# Patient Record
Sex: Male | Born: 1955
Health system: Southern US, Community
[De-identification: ages and names within clinical notes are randomized; demographics above are authoritative.]

## PROBLEM LIST (undated history)

## (undated) DIAGNOSIS — R7881 Bacteremia: Secondary | ICD-10-CM

## (undated) DIAGNOSIS — I509 Heart failure, unspecified: Secondary | ICD-10-CM

## (undated) DIAGNOSIS — I1310 Hypertensive heart and chronic kidney disease without heart failure, with stage 1 through stage 4 chronic kidney disease, or unspecified chronic kidney disease: Secondary | ICD-10-CM

## (undated) DIAGNOSIS — I469 Cardiac arrest, cause unspecified: Secondary | ICD-10-CM

## (undated) DIAGNOSIS — T827XXA Infection and inflammatory reaction due to other cardiac and vascular devices, implants and grafts, initial encounter: Secondary | ICD-10-CM

## (undated) DIAGNOSIS — R011 Cardiac murmur, unspecified: Secondary | ICD-10-CM

## (undated) DIAGNOSIS — I10 Essential (primary) hypertension: Secondary | ICD-10-CM

## (undated) DIAGNOSIS — J9601 Acute respiratory failure with hypoxia: Secondary | ICD-10-CM

## (undated) DIAGNOSIS — E119 Type 2 diabetes mellitus without complications: Secondary | ICD-10-CM

## (undated) DIAGNOSIS — N186 End stage renal disease: Secondary | ICD-10-CM

## (undated) DIAGNOSIS — I2510 Atherosclerotic heart disease of native coronary artery without angina pectoris: Secondary | ICD-10-CM

## (undated) DIAGNOSIS — I429 Cardiomyopathy, unspecified: Secondary | ICD-10-CM

## (undated) HISTORY — DX: Acute respiratory failure with hypoxia: J96.01

## (undated) HISTORY — PX: EXTERNAL EAR SURGERY: SHX627

## (undated) HISTORY — DX: Atherosclerotic heart disease of native coronary artery without angina pectoris: I25.10

## (undated) HISTORY — DX: Bacteremia: R78.81

## (undated) HISTORY — DX: Infection and inflammatory reaction due to other cardiac and vascular devices, implants and grafts, initial encounter: T82.7XXA

## (undated) HISTORY — PX: FINGER SURGERY: SHX640

## (undated) HISTORY — DX: Hypertensive heart and chronic kidney disease without heart failure, with stage 1 through stage 4 chronic kidney disease, or unspecified chronic kidney disease: I13.10

## (undated) HISTORY — DX: End stage renal disease: N18.6

## (undated) HISTORY — DX: Cardiomyopathy, unspecified: I42.9

## (undated) HISTORY — PX: TONSILLECTOMY: SUR1361

## (undated) SURGERY — Surgical Case
Anesthesia: *Unknown

---

## 2008-06-20 ENCOUNTER — Ambulatory Visit: Payer: Self-pay

## 2014-01-13 ENCOUNTER — Inpatient Hospital Stay (HOSPITAL_COMMUNITY)
Admission: EM | Admit: 2014-01-13 | Discharge: 2014-01-17 | DRG: 682 | Disposition: A | Discharge status: Home / Self Care | Payer: Self-pay | Attending: Internal Medicine | Admitting: Internal Medicine

## 2014-01-13 ENCOUNTER — Encounter (HOSPITAL_COMMUNITY): Payer: Self-pay | Admitting: Emergency Medicine

## 2014-01-13 DIAGNOSIS — I120 Hypertensive chronic kidney disease with stage 5 chronic kidney disease or end stage renal disease: Secondary | ICD-10-CM | POA: Diagnosis present

## 2014-01-13 DIAGNOSIS — E213 Hyperparathyroidism, unspecified: Secondary | ICD-10-CM | POA: Diagnosis present

## 2014-01-13 DIAGNOSIS — R7989 Other specified abnormal findings of blood chemistry: Secondary | ICD-10-CM | POA: Diagnosis present

## 2014-01-13 DIAGNOSIS — E1121 Type 2 diabetes mellitus with diabetic nephropathy: Secondary | ICD-10-CM

## 2014-01-13 DIAGNOSIS — R0609 Other forms of dyspnea: Secondary | ICD-10-CM

## 2014-01-13 DIAGNOSIS — I10 Essential (primary) hypertension: Secondary | ICD-10-CM | POA: Insufficient documentation

## 2014-01-13 DIAGNOSIS — R740 Nonspecific elevation of levels of transaminase and lactic acid dehydrogenase [LDH]: Secondary | ICD-10-CM | POA: Diagnosis present

## 2014-01-13 DIAGNOSIS — I5041 Acute combined systolic (congestive) and diastolic (congestive) heart failure: Secondary | ICD-10-CM | POA: Diagnosis present

## 2014-01-13 DIAGNOSIS — R0600 Dyspnea, unspecified: Secondary | ICD-10-CM

## 2014-01-13 DIAGNOSIS — N189 Chronic kidney disease, unspecified: Secondary | ICD-10-CM | POA: Diagnosis present

## 2014-01-13 DIAGNOSIS — N185 Chronic kidney disease, stage 5: Secondary | ICD-10-CM | POA: Diagnosis present

## 2014-01-13 DIAGNOSIS — E8809 Other disorders of plasma-protein metabolism, not elsewhere classified: Secondary | ICD-10-CM | POA: Diagnosis present

## 2014-01-13 DIAGNOSIS — Z87891 Personal history of nicotine dependence: Secondary | ICD-10-CM

## 2014-01-13 DIAGNOSIS — J90 Pleural effusion, not elsewhere classified: Secondary | ICD-10-CM

## 2014-01-13 DIAGNOSIS — I5043 Acute on chronic combined systolic (congestive) and diastolic (congestive) heart failure: Secondary | ICD-10-CM

## 2014-01-13 DIAGNOSIS — D631 Anemia in chronic kidney disease: Secondary | ICD-10-CM | POA: Diagnosis present

## 2014-01-13 DIAGNOSIS — R7401 Elevation of levels of liver transaminase levels: Secondary | ICD-10-CM

## 2014-01-13 DIAGNOSIS — N179 Acute kidney failure, unspecified: Principal | ICD-10-CM | POA: Diagnosis present

## 2014-01-13 DIAGNOSIS — R600 Localized edema: Secondary | ICD-10-CM

## 2014-01-13 HISTORY — DX: Type 2 diabetes mellitus without complications: E11.9

## 2014-01-13 HISTORY — DX: Essential (primary) hypertension: I10

## 2014-01-13 LAB — COMPREHENSIVE METABOLIC PANEL
ALBUMIN: 2.9 g/dL — AB (ref 3.5–5.2)
ALT: 72 U/L — AB (ref 0–53)
AST: 48 U/L — AB (ref 0–37)
Alkaline Phosphatase: 236 U/L — ABNORMAL HIGH (ref 39–117)
Anion gap: 17 — ABNORMAL HIGH (ref 5–15)
BUN: 54 mg/dL — ABNORMAL HIGH (ref 6–23)
CALCIUM: 8.3 mg/dL — AB (ref 8.4–10.5)
CO2: 19 mEq/L (ref 19–32)
Chloride: 103 mEq/L (ref 96–112)
Creatinine, Ser: 5.94 mg/dL — ABNORMAL HIGH (ref 0.50–1.35)
GFR calc Af Amer: 11 mL/min — ABNORMAL LOW (ref >90)
GFR calc non Af Amer: 9 mL/min — ABNORMAL LOW (ref >90)
Glucose, Bld: 110 mg/dL — ABNORMAL HIGH (ref 70–99)
Potassium: 4.4 mEq/L (ref 3.7–5.3)
SODIUM: 139 meq/L (ref 137–147)
TOTAL PROTEIN: 6.5 g/dL (ref 6.0–8.3)
Total Bilirubin: 0.4 mg/dL (ref 0.3–1.2)

## 2014-01-13 LAB — CBC WITH DIFFERENTIAL/PLATELET
Basophils Absolute: 0 10*3/uL (ref 0.0–0.1)
Basophils Relative: 0 % (ref 0–1)
EOS PCT: 1 % (ref 0–5)
Eosinophils Absolute: 0.1 10*3/uL (ref 0.0–0.7)
HEMATOCRIT: 33.8 % — AB (ref 39.0–52.0)
HEMOGLOBIN: 10.9 g/dL — AB (ref 13.0–17.0)
LYMPHS ABS: 1.4 10*3/uL (ref 0.7–4.0)
Lymphocytes Relative: 16 % (ref 12–46)
MCH: 26.9 pg (ref 26.0–34.0)
MCHC: 32.2 g/dL (ref 30.0–36.0)
MCV: 83.5 fL (ref 78.0–100.0)
Monocytes Absolute: 0.8 10*3/uL (ref 0.1–1.0)
Monocytes Relative: 8 % (ref 3–12)
Neutro Abs: 6.8 10*3/uL (ref 1.7–7.7)
Neutrophils Relative %: 75 % (ref 43–77)
PLATELETS: 331 10*3/uL (ref 150–400)
RBC: 4.05 MIL/uL — ABNORMAL LOW (ref 4.22–5.81)
RDW: 17.1 % — ABNORMAL HIGH (ref 11.5–15.5)
WBC: 9 10*3/uL (ref 4.0–10.5)

## 2014-01-13 LAB — URINALYSIS, ROUTINE W REFLEX MICROSCOPIC
BILIRUBIN URINE: NEGATIVE
Glucose, UA: NEGATIVE mg/dL
Ketones, ur: NEGATIVE mg/dL
LEUKOCYTES UA: NEGATIVE
Nitrite: NEGATIVE
Protein, ur: >300 mg/dL — AB
Specific Gravity, Urine: 1.017 (ref 1.005–1.030)
Urobilinogen, UA: 0.2 mg/dL (ref 0.0–1.0)
pH: 5 (ref 5.0–8.0)

## 2014-01-13 LAB — GLUCOSE, CAPILLARY
GLUCOSE-CAPILLARY: 110 mg/dL — AB (ref 70–99)
GLUCOSE-CAPILLARY: 142 mg/dL — AB (ref 70–99)

## 2014-01-13 LAB — PROTEIN / CREATININE RATIO, URINE
Creatinine, Urine: 148.08 mg/dL
Protein Creatinine Ratio: 3.49 — ABNORMAL HIGH (ref 0.00–0.15)
Total Protein, Urine: 516.5 mg/dL

## 2014-01-13 LAB — URINE MICROSCOPIC-ADD ON

## 2014-01-13 LAB — HEMOGLOBIN A1C
HEMOGLOBIN A1C: 6.5 % — AB (ref <5.7)
Mean Plasma Glucose: 140 mg/dL — ABNORMAL HIGH (ref <117)

## 2014-01-13 LAB — TROPONIN I
Troponin I: <0.3 ng/mL (ref <0.30)
Troponin I: <0.3 ng/mL (ref <0.30)

## 2014-01-13 LAB — I-STAT CG4 LACTIC ACID, ED: LACTIC ACID, VENOUS: 1.09 mmol/L (ref 0.5–2.2)

## 2014-01-13 LAB — LIPASE, BLOOD: Lipase: 16 U/L (ref 11–59)

## 2014-01-13 LAB — HIV ANTIBODY (ROUTINE TESTING W REFLEX): HIV: NONREACTIVE

## 2014-01-13 MED ORDER — ACETAMINOPHEN 650 MG RE SUPP: 650.0000 mg | Freq: Four times a day (QID) | RECTAL | Status: DC | PRN

## 2014-01-13 MED ORDER — ACETAMINOPHEN 325 MG PO TABS: 650.0000 mg | ORAL_TABLET | Freq: Four times a day (QID) | ORAL | Status: DC | PRN

## 2014-01-13 MED ORDER — SODIUM CHLORIDE 0.9 % IV SOLN: INTRAVENOUS | Status: DC

## 2014-01-13 MED ORDER — INSULIN ASPART 100 UNIT/ML ~~LOC~~ SOLN
0.0000 [IU] | Freq: Three times a day (TID) | SUBCUTANEOUS | Status: DC
  Administered 2014-01-14: 2 [IU] via SUBCUTANEOUS
  Administered 2014-01-15 – 2014-01-16 (×3): 1 [IU] via SUBCUTANEOUS

## 2014-01-13 MED ORDER — SODIUM CHLORIDE 0.9 % IV BOLUS (SEPSIS)
1000.0000 mL | Freq: Once | INTRAVENOUS | Status: AC
  Administered 2014-01-13: 1000 mL via INTRAVENOUS

## 2014-01-13 MED ORDER — ONDANSETRON HCL 4 MG/2ML IJ SOLN: 4.0000 mg | Freq: Four times a day (QID) | INTRAMUSCULAR | Status: DC | PRN

## 2014-01-13 MED ORDER — SODIUM CHLORIDE 0.9 % IJ SOLN
3.0000 mL | Freq: Two times a day (BID) | INTRAMUSCULAR | Status: DC
  Administered 2014-01-14 – 2014-01-17 (×6): 3 mL via INTRAVENOUS

## 2014-01-13 MED ORDER — ONDANSETRON HCL 4 MG PO TABS: 4.0000 mg | ORAL_TABLET | Freq: Four times a day (QID) | ORAL | Status: DC | PRN

## 2014-01-13 MED ORDER — ASPIRIN 81 MG PO CHEW
81.0000 mg | CHEWABLE_TABLET | Freq: Every day | ORAL | Status: DC
  Administered 2014-01-13 – 2014-01-17 (×5): 81 mg via ORAL
  Filled 2014-01-13 (×5): qty 1

## 2014-01-13 MED ORDER — HEPARIN SODIUM (PORCINE) 5000 UNIT/ML IJ SOLN
5000.0000 [IU] | Freq: Three times a day (TID) | INTRAMUSCULAR | Status: DC
  Administered 2014-01-13 – 2014-01-17 (×11): 5000 [IU] via SUBCUTANEOUS
  Filled 2014-01-13 (×12): qty 1

## 2014-01-13 MED ORDER — LABETALOL HCL 5 MG/ML IV SOLN
20.0000 mg | Freq: Once | INTRAVENOUS | Status: AC
  Administered 2014-01-13: 20 mg via INTRAVENOUS
  Filled 2014-01-13: qty 4

## 2014-01-13 MED ORDER — AMLODIPINE BESYLATE 10 MG PO TABS
10.0000 mg | ORAL_TABLET | Freq: Every day | ORAL | Status: DC
  Administered 2014-01-13 – 2014-01-15 (×3): 10 mg via ORAL
  Filled 2014-01-13 (×3): qty 1

## 2014-01-13 MED ORDER — SODIUM CHLORIDE 0.9 % IV SOLN
INTRAVENOUS | Status: AC
  Administered 2014-01-13: 16:00:00 via INTRAVENOUS

## 2014-01-13 NOTE — ED Notes (Signed)
hospitalist at bedside

## 2014-01-13 NOTE — H&P (Signed)
History and Physical  Kyle [name] CNO:[id number] DOB: [birth year] DOA: 01/13/2014   PCP: No primary care provider on file.   Chief Complaint: leg edema, dyspnea on exertion  HPI:  58 year old male with a history of diabetes mellitus and hypertension presented to the emergency department from urgent care when he was told that he had renal failure. The patient has been complaining of lower abdominal and lower extremity edema that has worsened in the past 2 weeks. In addition, he has had some dyspnea on exertion and decreased exercise tolerance over the past several weeks. He denies any fevers, chills, chest pain, dizziness, diaphoresis, vomiting, diarrhea, headaches, visual disturbance, focal extremity weakness. The patient previously took lisinopril, amlodipine, and glipizide, but he states that he has not taken any medications for over one year. In addition, the patient has not had any blood work for at least 2 years. He denies any hematochezia, melena, hemoptysis. He states that he has gained approximately 20 pounds in the last month. In the emergency department, the patient was noted to have a blood pressure 187/129. He was given labetalol IV. Serum creatinine was 5.94. Bicarbonate was 19. Potassium was 4.4. Hemoglobin was 10.9. Hepatic panel showed AST 72, AST 48, alk phosphatase 236, total bilirubin 0.4   Assessment/Plan: acute on chronic renal failure  -given the patient's clinical history, I suspect that he has had a degree of chronic renal insufficiency for a period of time -Full abdominal ultrasound to include the kidneys -Urinalysis negative for pyuria, but showed >355m/dL proteinuria -give IV fluid challenge--if no improvement, he may need renal consult and diuresis -accurate I/O -daily weight Lower extremity edema -Multifactorial including the patient's proteinuria/hypoalbuminemia -I'm also concerned about R-heart strain/failure as pt has JVD on exam with elevated LFTs  suggesting hepatic congestion -Full abdominal ultrasound to evaluate liver and kidneys -daily weight -check urine protein/creatinine ratio Transaminasemia -suspect hepatic congestion vs steatosis -Hepatitis B surface antigen, hepatitis C antibody -HIV antibody Diabetes mellitus type 2 -Check hemoglobin A1c -Novolog sliding-scale Hypertension -restart amlodipine -titrate meds pending clinical response -will not restart lisinipril Dyspnea on Exertion -suspect R-heart failure/strain -echocardiogram -EKG--sinus with T-wave inversion V4-V6 -telemetry for first 24 hours -cycle troponins -start ASA       Past Medical History  Diagnosis Date  . Hypertension   . Diabetes mellitus without complication    History reviewed. No pertinent past surgical history. Social History:  reports that he has quit smoking. He does not have any smokeless tobacco history on file. He reports that he does not drink alcohol or use illicit drugs.   History reviewed. No pertinent family history.   No Known Allergies    Prior to Admission medications   Medication Sig Start Date End Date Taking? Authorizing Provider  amLODipine (NORVASC) 5 MG tablet Take 5 mg by mouth daily.    Historical Provider, MD  B Complex Vitamins (VITAMIN-B COMPLEX PO) Take 1 tablet by mouth daily.   Yes Historical Provider, MD  BucAlfAspKGlucCouchParsUvaUrJu (WATER PILLS PO) Take 50 mg by mouth daily as needed (leg, stomach & feet swelling).   Yes Historical Provider, MD  ECHINACEA PO Take 1 tablet by mouth daily as needed (body cleanse).   Yes Historical Provider, MD  glipiZIDE (GLUCOTROL) 10 MG tablet Take 10 mg by mouth daily before breakfast.    Historical Provider, MD  lisinopril (PRINIVIL,ZESTRIL) 10 MG tablet Take 10 mg by mouth daily.    Historical Provider, MD  vitamin C (ASCORBIC ACID) 500 MG  tablet Take 500 mg by mouth daily.   Yes Historical Provider, MD    Review of Systems:  Constitutional:  No weight  loss, night sweats, Fevers, chills.  Head&Eyes: No headache.  No vision loss.  No eye pain or scotoma ENT:  No Difficulty swallowing,Tooth/dental problems,Sore throat,  No ear ache, post nasal drip,  Cardio-vascular:  No chest pain, Orthopnea, PND, swelling in lower extremities,  dizziness, palpitations  GI:  No  abdominal pain, nausea, vomiting, diarrhea, loss of appetite, hematochezia, melena, heartburn, indigestion, Resp:  No shortness of breath at rest. No cough. No coughing up of blood .No wheezing.No chest wall deformity ; +DOE Skin:  no rash or lesions.  GU:  no dysuria, change in color of urine, no urgency or frequency. No flank pain.  Musculoskeletal:  No joint pain or swelling. No decreased range of motion. No back pain.  Psych:  No change in mood or affect. No depression or anxiety. Neurologic: No headache, no dysesthesia, no focal weakness, no vision loss. No syncope  Physical Exam: Filed Vitals:   01/13/14 1400 01/13/14 1424 01/13/14 1450 01/13/14 1455  BP: 175/110 187/129 189/125 170/118  Pulse: 88 89 91 74  Temp:      TempSrc:      Resp:  16 16 16   SpO2:  99% 97%    General:  A&O x 3, NAD, nontoxic, pleasant/cooperative Head/Eye: No conjunctival hemorrhage, no icterus, Cunningham/AT, No nystagmus ENT:  No icterus,  No thrush, good dentition, no pharyngeal exudate Neck:  No masses, no lymphadenpathy, no bruits; +JVD CV:  RRR, no rub, no gallop, no S3 Lung:  CTAB, good air movement, no wheeze, no rhonchi Abdomen: soft/NT, +BS, nondistended, no peritoneal signs Ext: No cyanosis, No rashes, No petechiae, No lymphangitis, No edema Neuro: CNII-XII intact, strength 4/5 in bilateral upper and lower extremities, no dysmetria  Labs on Admission:  Basic Metabolic Panel:  Recent Labs Lab 01/13/14 1350  NA 139  K 4.4  CL 103  CO2 19  GLUCOSE 110*  BUN 54*  CREATININE 5.94*  CALCIUM 8.3*   Liver Function Tests:  Recent Labs Lab 01/13/14 1350  AST 48*  ALT 72*    ALKPHOS 236*  BILITOT 0.4  PROT 6.5  ALBUMIN 2.9*    Recent Labs Lab 01/13/14 1350  LIPASE 16   No results for input(s): AMMONIA in the last 168 hours. CBC:  Recent Labs Lab 01/13/14 1350  WBC 9.0  NEUTROABS 6.8  HGB 10.9*  HCT 33.8*  MCV 83.5  PLT 331   Cardiac Enzymes: No results for input(s): CKTOTAL, CKMB, CKMBINDEX, TROPONINI in the last 168 hours. BNP: Invalid input(s): POCBNP CBG: No results for input(s): GLUCAP in the last 168 hours.  Radiological Exams on Admission: No results found.  EKG: Independently reviewed. pending    Time spent:60 minutes Code Status:   FULL Family Communication:   Wife updated at bedside   Sultan Pargas, DO  [facility] Pager [phone]  If 7PM-7AM, please contact night-coverage [email or web address] Password [facility] 01/13/2014, 3:18 PM

## 2014-01-13 NOTE — ED Notes (Signed)
Pt measured blood pressure at home, was HTN, has been retaining fluid, went to see PCP today, sent here for HTN and decreased renal function.

## 2014-01-13 NOTE — ED Provider Notes (Signed)
CSN: [id number]     Arrival date & time 01/13/14  1324 History   First MD Initiated Contact with Patient 01/13/14 1345     Chief Complaint  Patient presents with  . Hypertension  . Decreased Renal Fx      (Consider location/radiation/quality/duration/timing/severity/associated sxs/prior Treatment) HPI  Patient presents with one month of illness. No clear precipitant, beyond mild URI like illness.  Over the past month patient has had increasing fatigue, generalized edema, with new difficulty with exertion. There is no focal pain, confusion, disorientation, syncope. There is associated anorexia and nausea. Patient acknowledged is not taking any medication, including antihypertensive, and antihyperglycemic medications for a long time. Today after symptoms seemed more pronounced he went to primary care, was referred here for further evaluation after initial findings suggested renal dysfunction.   Past Medical History  Diagnosis Date  . Hypertension   . Diabetes mellitus without complication    History reviewed. No pertinent past surgical history. History reviewed. No pertinent family history. History  Substance Use Topics  . Smoking status: Former Research scientist (life sciences)  . Smokeless tobacco: Not on file  . Alcohol Use: No    Review of Systems  Constitutional:       Per HPI, otherwise negative  HENT:       Per HPI, otherwise negative  Respiratory:       Per HPI, otherwise negative  Cardiovascular:       Per HPI, otherwise negative  Gastrointestinal: Negative for vomiting.  Endocrine:       Negative aside from HPI  Genitourinary:       Neg aside from HPI   Musculoskeletal:       Per HPI, otherwise negative  Skin: Negative.   Neurological: Negative for syncope.      Allergies  Review of patient's allergies indicates no known allergies.  Home Medications   Prior to Admission medications   Not on File   BP 180/131 mmHg  Pulse 98  Temp(Src) 97.5 F (36.4 C) (Oral)  Resp 16   SpO2 98% Physical Exam  Constitutional: He is oriented to person, place, and time. He appears well-developed. No distress.  HENT:  Head: Normocephalic and atraumatic.  Eyes: Conjunctivae and EOM are normal.  Cardiovascular: Normal rate and regular rhythm.   Pulmonary/Chest: Effort normal. No stridor. No respiratory distress.  Abdominal: He exhibits no distension.  Musculoskeletal: He exhibits no edema.  Patient is generalized symmetric edema, with taut skin both lower extremities.  Neurological: He is alert and oriented to person, place, and time.  Skin: Skin is warm and dry.  Psychiatric: He has a normal mood and affect.  Nursing note and vitals reviewed.   ED Course  Procedures (including critical care time) Labs Review Labs Reviewed  COMPREHENSIVE METABOLIC PANEL - Abnormal; Notable for the following:    Glucose, Bld 110 (*)    BUN 54 (*)    Creatinine, Ser 5.94 (*)    Calcium 8.3 (*)    Albumin 2.9 (*)    AST 48 (*)    ALT 72 (*)    Alkaline Phosphatase 236 (*)    GFR calc non Af Amer 9 (*)    GFR calc Af Amer 11 (*)    Anion gap 17 (*)    All other components within normal limits  CBC WITH DIFFERENTIAL - Abnormal; Notable for the following:    RBC 4.05 (*)    Hemoglobin 10.9 (*)    HCT 33.8 (*)    RDW 17.1 (*)  All other components within normal limits  LIPASE, BLOOD  URINALYSIS, ROUTINE W REFLEX MICROSCOPIC  I-STAT CG4 LACTIC ACID, ED    I reviewed the labs from the patient's outpatient visit. Abnormal creatinine.  On repeat exam the patient is:Marland Kitchen  Initial labs notable for creatinine of 5.9. IV fluids provided.  Pulse oximetry 100% room air normal   MDM  Vision presents with generalized fatigue, weakness, has notable bilateral lower extremity swelling, abdominal discomfort, and labs today are notable for evidence of new renal failure.  Patient required admission for further evaluation and management after fluid resuscitation.  Carmin Muskrat,  MD 01/13/14 [phone]

## 2014-01-13 NOTE — ED Notes (Signed)
Pt alert and oriented x4. Respirations even and unlabored, bilateral symmetrical rise and fall of chest. Skin warm and dry. In no acute distress. Denies needs.  md at bedside

## 2014-01-14 ENCOUNTER — Inpatient Hospital Stay (HOSPITAL_COMMUNITY): Payer: Self-pay

## 2014-01-14 ENCOUNTER — Inpatient Hospital Stay (HOSPITAL_COMMUNITY): Payer: Managed Care, Other (non HMO)

## 2014-01-14 DIAGNOSIS — E1121 Type 2 diabetes mellitus with diabetic nephropathy: Secondary | ICD-10-CM

## 2014-01-14 DIAGNOSIS — J90 Pleural effusion, not elsewhere classified: Secondary | ICD-10-CM

## 2014-01-14 DIAGNOSIS — I319 Disease of pericardium, unspecified: Secondary | ICD-10-CM

## 2014-01-14 LAB — CBC
HEMATOCRIT: 30 % — AB (ref 39.0–52.0)
HEMOGLOBIN: 9.8 g/dL — AB (ref 13.0–17.0)
MCH: 27.3 pg (ref 26.0–34.0)
MCHC: 32.7 g/dL (ref 30.0–36.0)
MCV: 83.6 fL (ref 78.0–100.0)
Platelets: 318 10*3/uL (ref 150–400)
RBC: 3.59 MIL/uL — AB (ref 4.22–5.81)
RDW: 17.2 % — ABNORMAL HIGH (ref 11.5–15.5)
WBC: 7 10*3/uL (ref 4.0–10.5)

## 2014-01-14 LAB — BASIC METABOLIC PANEL
Anion gap: 17 — ABNORMAL HIGH (ref 5–15)
BUN: 54 mg/dL — AB (ref 6–23)
CHLORIDE: 104 meq/L (ref 96–112)
CO2: 17 meq/L — AB (ref 19–32)
Calcium: 7.9 mg/dL — ABNORMAL LOW (ref 8.4–10.5)
Creatinine, Ser: 5.87 mg/dL — ABNORMAL HIGH (ref 0.50–1.35)
GFR calc Af Amer: 11 mL/min — ABNORMAL LOW (ref >90)
GFR calc non Af Amer: 10 mL/min — ABNORMAL LOW (ref >90)
GLUCOSE: 95 mg/dL (ref 70–99)
POTASSIUM: 3.9 meq/L (ref 3.7–5.3)
SODIUM: 138 meq/L (ref 137–147)

## 2014-01-14 LAB — HEPATITIS B SURFACE ANTIGEN: Hepatitis B Surface Ag: NEGATIVE

## 2014-01-14 LAB — GLUCOSE, CAPILLARY
GLUCOSE-CAPILLARY: 153 mg/dL — AB (ref 70–99)
Glucose-Capillary: 92 mg/dL (ref 70–99)
Glucose-Capillary: 170 mg/dL — ABNORMAL HIGH (ref 70–99)
Glucose-Capillary: 110 mg/dL — ABNORMAL HIGH (ref 70–99)

## 2014-01-14 LAB — HEPATITIS C ANTIBODY: HCV AB: NEGATIVE

## 2014-01-14 LAB — TROPONIN I

## 2014-01-14 LAB — PRO B NATRIURETIC PEPTIDE: Pro B Natriuretic peptide (BNP): 54690 pg/mL — ABNORMAL HIGH (ref 0–125)

## 2014-01-14 MED ORDER — METOPROLOL SUCCINATE ER 25 MG PO TB24
25.0000 mg | ORAL_TABLET | Freq: Every day | ORAL | Status: DC
  Administered 2014-01-14 – 2014-01-15 (×2): 25 mg via ORAL
  Filled 2014-01-14 (×2): qty 1

## 2014-01-14 MED ORDER — FUROSEMIDE 10 MG/ML IJ SOLN
40.0000 mg | Freq: Once | INTRAMUSCULAR | Status: AC
  Administered 2014-01-14: 40 mg via INTRAVENOUS
  Filled 2014-01-14: qty 4

## 2014-01-14 MED ORDER — INFLUENZA VAC SPLIT QUAD 0.5 ML IM SUSY
0.5000 mL | PREFILLED_SYRINGE | INTRAMUSCULAR | Status: AC
  Administered 2014-01-15: 0.5 mL via INTRAMUSCULAR
  Filled 2014-01-14 (×2): qty 0.5

## 2014-01-14 MED ORDER — FUROSEMIDE 10 MG/ML IJ SOLN
40.0000 mg | Freq: Every day | INTRAMUSCULAR | Status: DC
  Administered 2014-01-14 – 2014-01-15 (×2): 40 mg via INTRAVENOUS
  Filled 2014-01-14 (×2): qty 4

## 2014-01-14 NOTE — Care Management Note (Signed)
    Page 1 of 1   01/14/2014     3:44:17 PM CARE MANAGEMENT NOTE 01/14/2014  Patient:  Kyle [name], Kyle [name]   Account Number:  [id number]  Date Initiated:  01/14/2014  Documentation initiated by:  Dessa Phi  Subjective/Objective Assessment:   58 y/o m admitted w/acute renal failure.     Action/Plan:   From home w/spouse.works part-time, [employer] has expired-confirmed with admitting.   Anticipated DC Date:  01/18/2014   Anticipated DC Plan:  HOME/SELF CARE  In-house referral  [place]  CM consult  [facility]      Choice offered to / List presented to:             Status of service:  In process, will continue to follow Medicare Important Message given?   (If response is "NO", the following Medicare IM given date fields will be blank) Date Medicare IM given:   Medicare IM given by:   Date Additional Medicare IM given:   Additional Medicare IM given by:    Discharge Disposition:    Per UR Regulation:  Reviewed for med. necessity/level of care/duration of stay  If discussed at [place] of Stay Meetings, dates discussed:    Comments:  01/14/14 Dessa Phi RN BSN NCM [phone] Spoke to admitting, ed cm-who all confirmed that patient's [place] insurance has expired.Medicaid potential-Financial counselor following with resources.Provided w[employer] med list,community resources.[facility] hospital f/u appt set-see f/u section of d/c.

## 2014-01-14 NOTE — Progress Notes (Signed)
At 1600, pt had only voided 125cc of urine since 40mg  of IV lasix was given at 1300. MD made aware of findings and he requested pt have a bladder scan to check for residual. By the time the RN was able to complete the bladder scan, pt had just voided another 125cc of urine. Bladder scan showed 220cc of urine in the bladder. MD made aware of new findings. An order was entered to give pt another 40mg  of IV lasix. Will carry out MD orders and continue to monitor output.   Othella Boyer [facility] 01/14/2014 6:42 PM

## 2014-01-14 NOTE — Consult Note (Signed)
Referring Provider: No ref. provider found Primary Care Physician:  No primary care provider on file. 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 Fatigue and weakness and shortness of breath on exercise, he also had noted weight gain and lower extremity edema  No hematuria   No hemoptysis  No fever or chills/  No NSAIDS  No tobacco  Non compliant and has had no blood work in over several years . He was recommended a nephrologist in [facility] in 2012  Works as a Theme park manager   3 children     No alcohol  No FH of renal disease    Past Medical History  Diagnosis Date  . Hypertension   . Diabetes mellitus without complication     History reviewed. No pertinent past surgical history.  Prior to Admission medications   Medication Sig Start Date End Date Taking? Authorizing Provider  amLODipine (NORVASC) 5 MG tablet Take 5 mg by mouth daily.    Historical Provider, MD  B Complex Vitamins (VITAMIN-B COMPLEX PO) Take 1 tablet by mouth daily.   Yes Historical Provider, MD  BucAlfAspKGlucCouchParsUvaUrJu (WATER PILLS PO) Take 50 mg by mouth daily as needed (leg, stomach & feet swelling).   Yes Historical Provider, MD  ECHINACEA PO Take 1 tablet by mouth daily as needed (body cleanse).   Yes Historical Provider, MD  glipiZIDE (GLUCOTROL) 10 MG tablet Take 10 mg by mouth daily before breakfast.    Historical Provider, MD  lisinopril (PRINIVIL,ZESTRIL) 10 MG tablet Take 10 mg by mouth daily.    Historical Provider, MD  vitamin C (ASCORBIC ACID) 500 MG tablet Take 500 mg by mouth daily.   Yes Historical Provider, MD    Current Facility-Administered Medications  Medication Dose Route Frequency Provider Last Rate Last Dose  . 0.9 %  sodium chloride infusion   Intravenous Continuous Orson Eva, MD   0  at 01/13/14 1615  . acetaminophen (TYLENOL) tablet 650 mg  650 mg Oral Q6H PRN Orson Eva, MD       Or  . acetaminophen (TYLENOL) suppository 650 mg  650 mg Rectal Q6H PRN Orson Eva, MD      . amLODipine (NORVASC) tablet 10 mg  10 mg Oral Daily Orson Eva, MD   10 mg at 01/14/14 1023  . aspirin chewable tablet 81 mg  81 mg Oral Daily Orson Eva, MD   81 mg at 01/14/14 1023  . furosemide (LASIX) injection 40 mg  40 mg Intravenous Daily Orson Eva, MD   40 mg at 01/14/14 1304  . heparin injection 5,000 Units  5,000 Units Subcutaneous 3 times per day Orson Eva, MD   5,000 Units at 01/14/14 1258  . insulin aspart (novoLOG) injection 0-9 Units  0-9 Units Subcutaneous TID WC Orson Eva, MD   2 Units at 01/14/14 1259  . metoprolol succinate (TOPROL-XL) 24 hr tablet 25 mg  25 mg Oral Daily Orson Eva, MD   25 mg at 01/14/14 1023  . ondansetron (ZOFRAN) tablet 4 mg  4 mg Oral Q6H PRN Orson Eva, MD       Or  . ondansetron [facility]) injection 4 mg  4 mg Intravenous Q6H PRN Shanon Brow Tat, MD      . sodium chloride 0.9 % injection 3 mL  3 mL  Intravenous Q12H Orson Eva, MD   3 mL at 01/14/14 1023    Allergies as of 01/13/2014  . (No Known Allergies)    History reviewed. No pertinent family history.  History   Social History  . Marital Status: Married    Spouse Name: N/A    Number of Children: N/A  . Years of Education: N/A   Occupational History  . Not on file.   Social History Main Topics  . Smoking status: Former Research scientist (life sciences)  . Smokeless tobacco: Not on file  . Alcohol Use: No  . Drug Use: No  . Sexual Activity: Not on file   Other Topics Concern  . Not on file   Social History Narrative  . No narrative on file    Review of Systems: Gen: -  fever, chills, + weakness, malaise, HEENT: No visual complaints, No history of Retinopathy. Normal external appearance No Epistaxis or Sore throat. No sinusitis.   CV:, + orthopnea, PND, peripheral edema. Resp:  + dyspnea with exercise, cough,  sputum,  GI: Denies vomiting blood, jaundice, and fecal incontinence.   Denies dysphagia or odynophagia. GU : Denies urinary burning, blood in urine, urinary frequency, urinary hesitancy, nocturnal urination, and urinary incontinence.  No renal calculi. MS: Denies joint pain, limitation of movement, and swelling, stiffness, low back pain, extremity pain. Denies muscle weakness, cramps, atrophy.  No use of non steroidal antiinflammatory drugs. Derm: Denies rash, itching, dry skin, hives, moles, warts, or unhealing ulcers.  Psych: Denies depression, anxiety, memory loss, suicidal ideation, hallucinations, paranoia, and confusion. Heme: Denies bruising, bleeding, and enlarged lymph nodes. Neuro: No headache.  No diplopia. No dysarthria.  No dysphasia.  No history of CVA.  No Seizures. No paresthesias.  No weakness. Endocrine Diabetes.  No Thyroid disease.  No Adrenal disease.  Physical Exam: Vital signs in last 24 hours: Temp:  [97.5 F (36.4 C)-98.1 F (36.7 C)] 98.1 F (36.7 C) (12/21 1343) Pulse Rate:  [68-77] 72 (12/21 1343) Resp:  [16-20] 18 (12/21 1343) BP: (146-173)/(93-118) 146/95 mmHg (12/21 1343) SpO2:  [99 %-100 %] 99 % (12/21 1343) Weight:  [100.699 kg (222 lb)] 100.699 kg (222 lb) (12/20 1601) Last BM Date: 01/13/14 General:   Alert,  Well-developed, well-nourished, pleasant and cooperative in NAD Head:  Normocephalic and atraumatic. Eyes:  Sclera clear, no icterus.   Conjunctiva pink. Ears:  Normal auditory acuity. Nose:  No deformity, discharge,  or lesions. Mouth:  No deformity or lesions, dentition normal. Neck:  Supple; no masses or thyromegaly. JVP not elevated Lungs:  Clear throughout to auscultation.   No wheezes, crackles, or rhonchi. No acute distress. Heart:  Regular rate and rhythm; no murmurs, clicks, rubs,  or gallops. Abdomen:  Soft, nontender and nondistended. No masses, hepatosplenomegaly or hernias noted. Normal bowel sounds, without guarding, and without  rebound.   Msk:  Symmetrical without gross deformities. Normal posture. Pulses:  No carotid, renal, femoral bruits. DP and PT symmetrical and equal Extremities:  Without clubbing! +1 edema Neurologic:  Alert and  oriented x4;  grossly normal neurologically. Skin:  Intact without significant lesions or rashes. Cervical Nodes:  No significant cervical adenopathy. Psych:  Alert and cooperative. Normal mood and affect.  Intake/Output from previous day: 12/20 0701 - 12/21 0700 In: 720 [P.O.:720] Out: -  Intake/Output this shift: Total I/O In: 600 [P.O.:600] Out: -   Lab Results:  Recent Labs  01/13/14 1350 01/14/14 0408  WBC 9.0 7.0  HGB 10.9* 9.8*  HCT 33.8* 30.0*  PLT 331 318   BMET  Recent Labs  01/13/14 1350 01/14/14 0408  NA 139 138  K 4.4 3.9  CL 103 104  CO2 19 17*  GLUCOSE 110* 95  BUN 54* 54*  CREATININE 5.94* 5.87*  CALCIUM 8.3* 7.9*   LFT  Recent Labs  01/13/14 1350  PROT 6.5  ALBUMIN 2.9*  AST 48*  ALT 72*  ALKPHOS 236*  BILITOT 0.4   PT/INR No results for input(s): LABPROT, INR in the last 72 hours. Hepatitis Panel  Recent Labs  01/13/14 1536  HEPBSAG NEGATIVE  HCVAB NEGATIVE    Studies/Results: Dg Chest 2 View  01/14/2014   CLINICAL DATA:  Shortness of breath  EXAM: CHEST  2 VIEW  COMPARISON:  None.  FINDINGS: Cardiac shadow is within normal limits. Small bilateral pleural effusions are noted. Underlying atelectasis is present as well. No focal confluent infiltrate is seen.  IMPRESSION: Bilateral small effusions with atelectasis.   Electronically Signed   By: Inez Catalina M.D.   On: 01/14/2014 14:50   US Abdomen Complete  01/14/2014   CLINICAL DATA:  Initial encounter for elevated LFTs.  EXAM: ULTRASOUND ABDOMEN COMPLETE  COMPARISON:  None.  FINDINGS: Gallbladder: No gallstones or wall thickening visualized. Tiny cholesterol polyp noted. No sonographic Murphy sign noted.  Common bile duct: Diameter: Nondilated at 3 mm.  Liver: No  focal lesion identified. Within normal limits in parenchymal echogenicity.  IVC: No abnormality visualized.  Pancreas: Visualized portion unremarkable.  Spleen: Size and appearance within normal limits.  Right Kidney: Length: 11.3 cm. Increased renal cortical echogenicity noted. No hydronephrosis.  Left Kidney: Length: 9.9 cm. Also with increased echogenicity. No hydronephrosis.  Abdominal aorta: No aneurysm visualized.  Other findings: Moderate to large bilateral pleural effusions are evident. There it is associated mild to moderate ascites.  IMPRESSION: No evidence for biliary duct dilatation.  Moderate to large bilateral pleural effusions with mild to moderate ascites.  Increased renal echogenicity bilaterally suggests medical renal disease.   Electronically Signed   By: Misty Stanley M.D.   On: 01/14/2014 09:17    Assessment/Plan:       Renal Insufficiency with bilateral echogenic kidneys and history of diabetes (17 years ) and HTN    3.49 Protein/creatinine ratio  Inactive specimen    It appears that this represents chronic renal failure  Stage V                  HTN   Controlled         Anemia   Will need ESA   And check iron stores         Bones   Check PTH     LOS: 1 Elysha Daw W @TODAY @2 :54 PM

## 2014-01-14 NOTE — Progress Notes (Signed)
  Echocardiogram 2D Echocardiogram has been performed.  Kyle [name] 01/14/2014, 1:05 PM

## 2014-01-14 NOTE — Progress Notes (Signed)
PROGRESS NOTE  Kyle [name] [id number] DOB: January 18, 1956 DOA: 01/13/2014 PCP: No primary care provider on file.   Brief history 58 year old male with a history of diabetes mellitus and hypertension presented to the emergency department from urgent care when he was told that he had renal failure.  The patient has been complaining of lower abdominal and lower extremity edema that has worsened in the past 2 weeks. In addition, he has had some dyspnea on exertion and decreased exercise tolerance over the past several weeks. The patient previously took lisinopril, amlodipine, and glipizide, but he states that he has not taken any medications for over one year. In addition, the patient has not had any blood work for at least 2 years. Serum creatinine was 5.94. Bicarbonate was 19. Potassium was 4.4. Hemoglobin was 10.9 on day of admission.   The patient was clinically for overloaded and started on intravenous furosemide.  Abdominal ultrasound was negative for hydronephrosis but revealed echogenic kidneys as suspected. Echocardiogram was ordered and nephrology was consulted. Assessment/Plan: acute on chronic renal failure  -given the patient's clinical history,he has had a degree of chronic renal insufficiency for a period of time -Full abdominal ultrasound--echogenic kidneys with bilateral pleural effusion and ascites -Urinalysis negative for pyuria, but showed >300mg /dL proteinuria -accurate I/O -daily weight -consulted nephrology--spoke with Dr. Justin Mend -intact PTH Lower extremity edema/Proteinuria -Multifactorial including the patient's proteinuria/hypoalbuminemia with suspicion of cardiac dysfunction -I'm also concerned about R-heart strain/failure as pt has JVD on exam with elevated LFTs suggesting hepatic congestion -daily weight -check urine protein/creatinine ratio--3.49 -clinically fluid overloaded on exam--start IV lasix Transaminasemia -suspect hepatic congestion vs  steatosis -Hepatitis B surface antigen, hepatitis C antibody--neg -HIV antibody--neg Pleural Effusions -CXR -echo -IV Lasix -daily weights -strict I/O Diabetes mellitus type 2 -Check hemoglobin A1c--6.5 -Novolog sliding-scale Hypertension -restart amlodipine -start metoprolol succinate -will not restart lisinipril Dyspnea on Exertion -suspect R-heart failure/strain -echocardiogram -EKG--sinus with T-wave inversion V4-V6 -cycle troponins--neg x 3 -start ASA -clinically fluid overloaded on exam-->start IV Lasix Anemia of chronic disease/CKD -Iron studies -Serum B12, RBC folate Family Communication:   wife updated  Disposition Plan:   Home when medically stable     Procedures/Studies: US Abdomen Complete  01/14/2014   CLINICAL DATA:  Initial encounter for elevated LFTs.  EXAM: ULTRASOUND ABDOMEN COMPLETE  COMPARISON:  None.  FINDINGS: Gallbladder: No gallstones or wall thickening visualized. Tiny cholesterol polyp noted. No sonographic Murphy sign noted.  Common bile duct: Diameter: Nondilated at 3 mm.  Liver: No focal lesion identified. Within normal limits in parenchymal echogenicity.  IVC: No abnormality visualized.  Pancreas: Visualized portion unremarkable.  Spleen: Size and appearance within normal limits.  Right Kidney: Length: 11.3 cm. Increased renal cortical echogenicity noted. No hydronephrosis.  Left Kidney: Length: 9.9 cm. Also with increased echogenicity. No hydronephrosis.  Abdominal aorta: No aneurysm visualized.  Other findings: Moderate to large bilateral pleural effusions are evident. There it is associated mild to moderate ascites.  IMPRESSION: No evidence for biliary duct dilatation.  Moderate to large bilateral pleural effusions with mild to moderate ascites.  Increased renal echogenicity bilaterally suggests medical renal disease.   Electronically Signed   By: Misty Stanley M.D.   On: 01/14/2014 09:17         Subjective: Patient denies fevers, chills,  headache, chest pain, dyspnea, nausea, vomiting, diarrhea, abdominal pain, dysuria, hematuria   Objective: Filed Vitals:   01/13/14 1601 01/13/14 1728 01/13/14 2234 01/14/14 0550  BP:  173/110 170/105 152/93 155/100  Pulse: 72  75 77  Temp: 97.5 F (36.4 C)  97.6 F (36.4 C) 97.5 F (36.4 C)  TempSrc: Oral  Oral Oral  Resp: 18  18 20   Height: 6' (1.829 m)     Weight: 100.699 kg (222 lb)     SpO2: 100%  100% 100%    Intake/Output Summary (Last 24 hours) at 01/14/14 1230 Last data filed at 01/14/14 0534  Gross per 24 hour  Intake    720 ml  Output      0 ml  Net    720 ml   Weight change:  Exam:   General:  Pt is alert, follows commands appropriately, not in acute distress  HEENT: No icterus, No thrush,  [place]/AT  Cardiovascular: RRR, S1/S2, no rubs, no gallops+JVD  Respiratory: bibasilar crackles, no wheeze  Abdomen: Soft/+BS, non tender, non distended, no guarding  Extremities: 2+LE edema, No lymphangitis, No petechiae, No rashes, no synovitis  Data Reviewed: Basic Metabolic Panel:  Recent Labs Lab 01/13/14 1350 01/14/14 0408  NA 139 138  K 4.4 3.9  CL 103 104  CO2 19 17*  GLUCOSE 110* 95  BUN 54* 54*  CREATININE 5.94* 5.87*  CALCIUM 8.3* 7.9*   Liver Function Tests:  Recent Labs Lab 01/13/14 1350  AST 48*  ALT 72*  ALKPHOS 236*  BILITOT 0.4  PROT 6.5  ALBUMIN 2.9*    Recent Labs Lab 01/13/14 1350  LIPASE 16   No results for input(s): AMMONIA in the last 168 hours. CBC:  Recent Labs Lab 01/13/14 1350 01/14/14 0408  WBC 9.0 7.0  NEUTROABS 6.8  --   HGB 10.9* 9.8*  HCT 33.8* 30.0*  MCV 83.5 83.6  PLT 331 318   Cardiac Enzymes:  Recent Labs Lab 01/13/14 1650 01/13/14 2208 01/14/14 0408  TROPONINI <0.30 <0.30 <0.30   BNP: Invalid input(s): POCBNP CBG:  Recent Labs Lab 01/13/14 1702 01/13/14 2217 01/14/14 0737 01/14/14 1159  GLUCAP 110* 142* 92 170*    No results found for this or any previous visit (from the past  240 hour(s)).   Scheduled Meds: . amLODipine  10 mg Oral Daily  . aspirin  81 mg Oral Daily  . heparin  5,000 Units Subcutaneous 3 times per day  . insulin aspart  0-9 Units Subcutaneous TID WC  . metoprolol succinate  25 mg Oral Daily  . sodium chloride  3 mL Intravenous Q12H   Continuous Infusions: . sodium chloride       Meric Joye, DO  [facility] Pager [phone]  If 7PM-7AM, please contact night-coverage [email or web address] Password [email or web address] 01/14/2014, 12:30 PM   LOS: 1 day

## 2014-01-15 DIAGNOSIS — I5041 Acute combined systolic (congestive) and diastolic (congestive) heart failure: Secondary | ICD-10-CM

## 2014-01-15 DIAGNOSIS — N185 Chronic kidney disease, stage 5: Secondary | ICD-10-CM

## 2014-01-15 DIAGNOSIS — I5043 Acute on chronic combined systolic (congestive) and diastolic (congestive) heart failure: Secondary | ICD-10-CM

## 2014-01-15 HISTORY — DX: Acute on chronic combined systolic (congestive) and diastolic (congestive) heart failure: I50.43

## 2014-01-15 LAB — RENAL FUNCTION PANEL
ANION GAP: 9 (ref 5–15)
Albumin: 2.9 g/dL — ABNORMAL LOW (ref 3.5–5.2)
BUN: 58 mg/dL — ABNORMAL HIGH (ref 6–23)
CO2: 21 mmol/L (ref 19–32)
Calcium: 7.7 mg/dL — ABNORMAL LOW (ref 8.4–10.5)
Chloride: 109 mEq/L (ref 96–112)
Creatinine, Ser: 5.66 mg/dL — ABNORMAL HIGH (ref 0.50–1.35)
GFR calc non Af Amer: 10 mL/min — ABNORMAL LOW (ref >90)
GFR, EST AFRICAN AMERICAN: 12 mL/min — AB (ref >90)
GLUCOSE: 147 mg/dL — AB (ref 70–99)
POTASSIUM: 4 mmol/L (ref 3.5–5.1)
Phosphorus: 4.4 mg/dL (ref 2.3–4.6)
SODIUM: 139 mmol/L (ref 135–145)

## 2014-01-15 LAB — RETICULOCYTES
RBC.: 3.59 MIL/uL — AB (ref 4.22–5.81)
Retic Count, Absolute: 43.1 10*3/uL (ref 19.0–186.0)
Retic Ct Pct: 1.2 % (ref 0.4–3.1)

## 2014-01-15 LAB — GLUCOSE, CAPILLARY
GLUCOSE-CAPILLARY: 139 mg/dL — AB (ref 70–99)
GLUCOSE-CAPILLARY: 126 mg/dL — AB (ref 70–99)
Glucose-Capillary: 110 mg/dL — ABNORMAL HIGH (ref 70–99)
Glucose-Capillary: 128 mg/dL — ABNORMAL HIGH (ref 70–99)

## 2014-01-15 LAB — IRON AND TIBC
Iron: 36 ug/dL — ABNORMAL LOW (ref 42–135)
Saturation Ratios: 15 % — ABNORMAL LOW (ref 20–55)
TIBC: 233 ug/dL (ref 215–435)
UIBC: 197 ug/dL (ref 125–400)

## 2014-01-15 LAB — VITAMIN B12: VITAMIN B 12: 757 pg/mL (ref 211–911)

## 2014-01-15 LAB — FERRITIN: Ferritin: 51 ng/mL (ref 22–322)

## 2014-01-15 MED ORDER — FUROSEMIDE 10 MG/ML IJ SOLN
40.0000 mg | Freq: Once | INTRAMUSCULAR | Status: AC
  Administered 2014-01-15: 40 mg via INTRAVENOUS
  Filled 2014-01-15: qty 4

## 2014-01-15 MED ORDER — ISOSORBIDE DINITRATE 10 MG PO TABS
10.0000 mg | ORAL_TABLET | Freq: Three times a day (TID) | ORAL | Status: DC
  Administered 2014-01-15 – 2014-01-16 (×5): 10 mg via ORAL
  Filled 2014-01-15 (×7): qty 1

## 2014-01-15 MED ORDER — CARVEDILOL 6.25 MG PO TABS
6.2500 mg | ORAL_TABLET | Freq: Two times a day (BID) | ORAL | Status: DC
  Administered 2014-01-15 – 2014-01-17 (×4): 6.25 mg via ORAL
  Filled 2014-01-15 (×4): qty 1

## 2014-01-15 MED ORDER — FUROSEMIDE 10 MG/ML IJ SOLN
80.0000 mg | Freq: Two times a day (BID) | INTRAMUSCULAR | Status: DC
  Administered 2014-01-15 – 2014-01-16 (×3): 80 mg via INTRAVENOUS
  Filled 2014-01-15 (×3): qty 8

## 2014-01-15 NOTE — Progress Notes (Signed)
[facility] ROUNDING NOTE   Subjective:   Interval History: spirits are suprisingly good   EF 15 %  Possible cardiomyopathy and will check SPEP and UPEP  Objective:  Vital signs in last 24 hours:  Temp:  [97.5 F (36.4 C)-98.3 F (36.8 C)] 97.5 F (36.4 C) (12/22 1314) Pulse Rate:  [72-78] 78 (12/22 1314) Resp:  [18-20] 18 (12/22 1314) BP: (146-158)/(92-105) 158/94 mmHg (12/22 1314) SpO2:  [97 %-100 %] 97 % (12/22 1314) Weight:  [103.738 kg (228 lb 11.2 oz)] 103.738 kg (228 lb 11.2 oz) (12/22 0403)  Weight change: 3.039 kg (6 lb 11.2 oz) Filed Weights   01/13/14 1601 01/15/14 0403  Weight: 100.699 kg (222 lb) 103.738 kg (228 lb 11.2 oz)    Intake/Output: I/O last 3 completed shifts: In: 1560 [P.O.:1560] Out: 1125 [Urine:1125]   Intake/Output this shift:     CVS- RRR RS- CTA ABD- BS present soft non-distended EXT- 1 + edema   Basic Metabolic Panel:  Recent Labs Lab 01/13/14 1350 01/14/14 0408 01/15/14 0415  NA 139 138 139  K 4.4 3.9 4.0  CL 103 104 109  CO2 19 17* 21  GLUCOSE 110* 95 147*  BUN 54* 54* 58*  CREATININE 5.94* 5.87* 5.66*  CALCIUM 8.3* 7.9* 7.7*  PHOS  --   --  4.4    Liver Function Tests:  Recent Labs Lab 01/13/14 1350 01/15/14 0415  AST 48*  --   ALT 72*  --   ALKPHOS 236*  --   BILITOT 0.4  --   PROT 6.5  --   ALBUMIN 2.9* 2.9*    Recent Labs Lab 01/13/14 1350  LIPASE 16   No results for input(s): AMMONIA in the last 168 hours.  CBC:  Recent Labs Lab 01/13/14 1350 01/14/14 0408  WBC 9.0 7.0  NEUTROABS 6.8  --   HGB 10.9* 9.8*  HCT 33.8* 30.0*  MCV 83.5 83.6  PLT 331 318    Cardiac Enzymes:  Recent Labs Lab 01/13/14 1650 01/13/14 2208 01/14/14 0408  TROPONINI <0.30 <0.30 <0.30    BNP: Invalid input(s): POCBNP  CBG:  Recent Labs Lab 01/14/14 0737 01/14/14 1159 01/14/14 1618 01/14/14 2134 01/15/14 0739  GLUCAP 92 170* 110* 153* 110*    Microbiology: No results found for  this or any previous visit.  Coagulation Studies: No results for input(s): LABPROT, INR in the last 72 hours.  Urinalysis:  Recent Labs  01/13/14 1415  COLORURINE YELLOW  LABSPEC 1.017  PHURINE 5.0  GLUCOSEU NEGATIVE  HGBUR MODERATE*  BILIRUBINUR NEGATIVE  KETONESUR NEGATIVE  PROTEINUR >300*  UROBILINOGEN 0.2  NITRITE NEGATIVE  LEUKOCYTESUR NEGATIVE      Imaging: Dg Chest 2 View  01/14/2014   CLINICAL DATA:  Shortness of breath  EXAM: CHEST  2 VIEW  COMPARISON:  None.  FINDINGS: Cardiac shadow is within normal limits. Small bilateral pleural effusions are noted. Underlying atelectasis is present as well. No focal confluent infiltrate is seen.  IMPRESSION: Bilateral small effusions with atelectasis.   Electronically Signed   By: Inez Catalina M.D.   On: 01/14/2014 14:50   US Abdomen Complete  01/14/2014   CLINICAL DATA:  Initial encounter for elevated LFTs.  EXAM: ULTRASOUND ABDOMEN COMPLETE  COMPARISON:  None.  FINDINGS: Gallbladder: No gallstones or wall thickening visualized. Tiny cholesterol polyp noted. No sonographic Murphy sign noted.  Common bile duct: Diameter: Nondilated at 3 mm.  Liver: No focal lesion identified. Within normal limits in parenchymal echogenicity.  IVC: No abnormality visualized.  Pancreas: Visualized portion unremarkable.  Spleen: Size and appearance within normal limits.  Right Kidney: Length: 11.3 cm. Increased renal cortical echogenicity noted. No hydronephrosis.  Left Kidney: Length: 9.9 cm. Also with increased echogenicity. No hydronephrosis.  Abdominal aorta: No aneurysm visualized.  Other findings: Moderate to large bilateral pleural effusions are evident. There it is associated mild to moderate ascites.  IMPRESSION: No evidence for biliary duct dilatation.  Moderate to large bilateral pleural effusions with mild to moderate ascites.  Increased renal echogenicity bilaterally suggests medical renal disease.   Electronically Signed   By: Misty Stanley  M.D.   On: 01/14/2014 09:17     Medications:     . aspirin  81 mg Oral Daily  . carvedilol  6.25 mg Oral BID WC  . furosemide  40 mg Intravenous Once  . furosemide  80 mg Intravenous BID  . heparin  5,000 Units Subcutaneous 3 times per day  . insulin aspart  0-9 Units Subcutaneous TID WC  . isosorbide dinitrate  10 mg Oral TID  . sodium chloride  3 mL Intravenous Q12H   acetaminophen **OR** acetaminophen, ondansetron **OR** ondansetron (ZOFRAN) IV  Assessment/ Plan:   CKD stage 5  I think that he will progress to ESRD most likely from diabetes and diabetic nephropathy  We shall check SPEP and UPEP   HTN  Will follow  PTH awaiting result  Anemia  Iron studies  Appreciate Dr Mariana Arn and Dr Tat input   LOS: 2 Kyle [name] W @TODAY @1 :27 PM

## 2014-01-15 NOTE — Progress Notes (Signed)
PROGRESS NOTE  Kyle [name] [id number] DOB: 06-Nov-1955 DOA: 01/13/2014 PCP: No primary care provider on file.   Brief history 58 year old male with a history of diabetes mellitus and hypertension presented to the emergency department from urgent care when he was told that he had renal failure. The patient has been complaining of lower abdominal and lower extremity edema that has worsened in the past 2 weeks. In addition, he has had some dyspnea on exertion and decreased exercise tolerance over the past several weeks. The patient previously took lisinopril, amlodipine, and glipizide, but he states that he has not taken any medications for over 2 years. In addition, the patient has not had any blood work for at least 2 years. Serum creatinine was 5.94. Bicarbonate was 19. Potassium was 4.4. Hemoglobin was 10.9 on day of admission. The patient was clinically for overloaded and started on intravenous furosemide. Abdominal ultrasound was negative for hydronephrosis but revealed echogenic kidneys as suspected. Echocardiogram was ordered and nephrology was consulted.  Cardiology consulted after Echo results returned with EF 15%. Assessment/Plan: Acute systolic and diastolic CHF -[id number] echo EF 0000000, grade 2 diastolic dysfxn, dilated RV, mod TR -increase furosemide to 80mg  IV bid -consult cardiology -accurate I/Os -Remains clinically fluid overloaded CKD stage 5 -given the patient's clinical history,he has had a degree of chronic renal insufficiency for a period of time -Full abdominal ultrasound--echogenic kidneys with bilateral pleural effusion and ascites -Urinalysis negative for pyuria, but showed >300mg /dL proteinuria -accurate I/O -daily weight -appreciate nephrology--spoke with Dr. Justin Mend -intact PTH Lower extremity edema/Proteinuria -Multifactorial including the patient's proteinuria/hypoalbuminemia and cardiac dysfunction -elevated LFTs suggesting hepatic  congestion -daily weight -check urine protein/creatinine ratio--3.49 Transaminasemia -suspect hepatic congestion vs steatosis -Hepatitis B surface antigen, hepatitis C antibody--neg -HIV antibody--neg Diabetes mellitus type 2 -Check hemoglobin A1c--6.5 -Novolog sliding-scale Hypertension -restart amlodipine -Switch to carvedilol -will not restart lisinipril Anemia of chronic disease/CKD -Iron studies--pending -Serum B12, RBC folate--pending Family Communication: wife updated  Disposition Plan: Home when medically stable   Procedures/Studies: Dg Chest 2 View  01/14/2014   CLINICAL DATA:  Shortness of breath  EXAM: CHEST  2 VIEW  COMPARISON:  None.  FINDINGS: Cardiac shadow is within normal limits. Small bilateral pleural effusions are noted. Underlying atelectasis is present as well. No focal confluent infiltrate is seen.  IMPRESSION: Bilateral small effusions with atelectasis.   Electronically Signed   By: Inez Catalina M.D.   On: 01/14/2014 14:50   US Abdomen Complete  01/14/2014   CLINICAL DATA:  Initial encounter for elevated LFTs.  EXAM: ULTRASOUND ABDOMEN COMPLETE  COMPARISON:  None.  FINDINGS: Gallbladder: No gallstones or wall thickening visualized. Tiny cholesterol polyp noted. No sonographic Murphy sign noted.  Common bile duct: Diameter: Nondilated at 3 mm.  Liver: No focal lesion identified. Within normal limits in parenchymal echogenicity.  IVC: No abnormality visualized.  Pancreas: Visualized portion unremarkable.  Spleen: Size and appearance within normal limits.  Right Kidney: Length: 11.3 cm. Increased renal cortical echogenicity noted. No hydronephrosis.  Left Kidney: Length: 9.9 cm. Also with increased echogenicity. No hydronephrosis.  Abdominal aorta: No aneurysm visualized.  Other findings: Moderate to large bilateral pleural effusions are evident. There it is associated mild to moderate ascites.  IMPRESSION: No evidence for biliary duct dilatation.  Moderate to  large bilateral pleural effusions with mild to moderate ascites.  Increased renal echogenicity bilaterally suggests medical renal disease.   Electronically Signed   By: Verda Cumins.D.  On: 01/14/2014 09:17         Subjective: Patient is breathing better.  Patient denies fevers, chills, headache, chest pain, dyspnea, nausea, vomiting, diarrhea, abdominal pain, dysuria, hematuria   Objective: Filed Vitals:   01/14/14 0550 01/14/14 1343 01/14/14 2129 01/15/14 0403  BP: 155/100 146/95 155/92 157/105  Pulse: 77 72 77 76  Temp: 97.5 F (36.4 C) 98.1 F (36.7 C) 97.7 F (36.5 C) 98.3 F (36.8 C)  TempSrc: Oral Oral Oral Oral  Resp: 20 18 20 20   Height:      Weight:    103.738 kg (228 lb 11.2 oz)  SpO2: 100% 99% 100% 98%    Intake/Output Summary (Last 24 hours) at 01/15/14 1038 Last data filed at 01/15/14 0356  Gross per 24 hour  Intake    480 ml  Output   1125 ml  Net   -645 ml   Weight change: 3.039 kg (6 lb 11.2 oz) Exam:   General:  Pt is alert, follows commands appropriately, not in acute distress  HEENT: No icterus, No thrush, [place]/AT  Cardiovascular: RRR, S1/S2, no rubs, no gallops  Respiratory: Bibasilar crackles. Diminished breath sounds at the bases. No wheezing. Good air movement  Abdomen: Soft/+BS, non tender, non distended, no guarding  Extremities: 2+LEedema, No lymphangitis, No petechiae, No rashes, no synovitis  Data Reviewed: Basic Metabolic Panel:  Recent Labs Lab 01/13/14 1350 01/14/14 0408 01/15/14 0415  NA 139 138 139  K 4.4 3.9 4.0  CL 103 104 109  CO2 19 17* 21  GLUCOSE 110* 95 147*  BUN 54* 54* 58*  CREATININE 5.94* 5.87* 5.66*  CALCIUM 8.3* 7.9* 7.7*  PHOS  --   --  4.4   Liver Function Tests:  Recent Labs Lab 01/13/14 1350 01/15/14 0415  AST 48*  --   ALT 72*  --   ALKPHOS 236*  --   BILITOT 0.4  --   PROT 6.5  --   ALBUMIN 2.9* 2.9*    Recent Labs Lab 01/13/14 1350  LIPASE 16   No results for input(s):  AMMONIA in the last 168 hours. CBC:  Recent Labs Lab 01/13/14 1350 01/14/14 0408  WBC 9.0 7.0  NEUTROABS 6.8  --   HGB 10.9* 9.8*  HCT 33.8* 30.0*  MCV 83.5 83.6  PLT 331 318   Cardiac Enzymes:  Recent Labs Lab 01/13/14 1650 01/13/14 2208 01/14/14 0408  TROPONINI <0.30 <0.30 <0.30   BNP: Invalid input(s): POCBNP CBG:  Recent Labs Lab 01/14/14 0737 01/14/14 1159 01/14/14 1618 01/14/14 2134 01/15/14 0739  GLUCAP 92 170* 110* 153* 110*    No results found for this or any previous visit (from the past 240 hour(s)).   Scheduled Meds: . amLODipine  10 mg Oral Daily  . aspirin  81 mg Oral Daily  . furosemide  40 mg Intravenous Once  . furosemide  80 mg Intravenous BID  . heparin  5,000 Units Subcutaneous 3 times per day  . insulin aspart  0-9 Units Subcutaneous TID WC  . metoprolol succinate  25 mg Oral Daily  . sodium chloride  3 mL Intravenous Q12H   Continuous Infusions:    Fredna Stricker, DO  [facility] Pager [phone]  If 7PM-7AM, please contact night-coverage [email or web address] Password [email or web address] 01/15/2014, 10:38 AM   LOS: 2 days

## 2014-01-15 NOTE — Consult Note (Signed)
CARDIOLOGY CONSULT NOTE       Patient ID: Kyle [name] MRN: [id number] DOB/AGE: [birth year] 58 y.o.  Admit date: 01/13/2014 Referring Physician:  Tat Primary Physician: No primary care provider on file. Primary Cardiologist:  New Reason for Consultation:  CHF  Active Problems:   Acute kidney failure   Acute on chronic renal failure   Lower extremity edema   Transaminasemia   Dyspnea on exertion   Pleural effusion   Diabetes mellitus with nephropathy   Acute combined systolic and diastolic CHF, NYHA class 2   CKD (chronic kidney disease) stage 5, GFR less than 15 ml/min   HPI:   58 yo admitted with increasing anasarca and LE edema  Found to have Stage 5 kidney disease with Cr 5.87.  Still urinating well has received iv bid lasix.  Noted increasing abdominal pain and LE edema last 3 weeks.  Has basically neglected his health and not taken his BP and DM meds.  Not been seen by doctor before going to urgent care in over 2 years Denies chest pain or history of MI  Reviewed his echo and he has echogenic myocardium that could represent amyloid with small pericardial effusion and EF 15% with elevated EDP on diastolic evaluation.  ? Of mass in pericardial space near RV AV groove but likely represents epicardial fat.    ROS All other systems reviewed and negative except as noted above  Past Medical History  Diagnosis Date  . Hypertension   . Diabetes mellitus without complication     History reviewed. No pertinent family history.  History   Social History  . Marital Status: Married    Spouse Name: N/A    Number of Children: N/A  . Years of Education: N/A   Occupational History  . Not on file.   Social History Main Topics  . Smoking status: Former Research scientist (life sciences)  . Smokeless tobacco: Not on file  . Alcohol Use: No  . Drug Use: No  . Sexual Activity: Not on file   Other Topics Concern  . Not on file   Social History Narrative  . No narrative on file    History reviewed.  No pertinent past surgical history.   Marland Kitchen amLODipine  10 mg Oral Daily  . aspirin  81 mg Oral Daily  . carvedilol  6.25 mg Oral BID WC  . furosemide  40 mg Intravenous Once  . furosemide  80 mg Intravenous BID  . heparin  5,000 Units Subcutaneous 3 times per day  . insulin aspart  0-9 Units Subcutaneous TID WC  . sodium chloride  3 mL Intravenous Q12H      Physical Exam: Blood pressure 157/105, pulse 76, temperature 98.3 F (36.8 C), temperature source Oral, resp. rate 20, height 6' (1.829 m), weight 103.738 kg (228 lb 11.2 oz), SpO2 98 %.    Affect appropriate Middle aged black male  HEENT: normal Neck supple with no adenopathy JVP normal no bruits no thyromegaly Lungs clear with no wheezing and good diaphragmatic motion Heart:  S1/S2 split no murmur, no rub, gallop or click PMI normal Abdomen: positive HJR and ? ascites no bruit.  No HSM or HJR Distal pulses intact with no bruits Plus 2-3 tense edema Neuro non-focal Skin warm and dry No muscular weakness   Labs:   Lab Results  Component Value Date   WBC 7.0 01/14/2014   HGB 9.8* 01/14/2014   HCT 30.0* 01/14/2014   MCV 83.6 01/14/2014   PLT 318 01/14/2014  Recent Labs Lab 01/13/14 1350  01/15/14 0415  NA 139  < > 139  K 4.4  < > 4.0  CL 103  < > 109  CO2 19  < > 21  BUN 54*  < > 58*  CREATININE 5.94*  < > 5.66*  CALCIUM 8.3*  < > 7.7*  PROT 6.5  --   --   BILITOT 0.4  --   --   ALKPHOS 236*  --   --   ALT 72*  --   --   AST 48*  --   --   GLUCOSE 110*  < > 147*  < > = values in this interval not displayed. Lab Results  Component Value Date   TROPONINI <0.30 01/14/2014   No results found for: CHOL No results found for: HDL No results found for: LDLCALC No results found for: TRIG No results found for: CHOLHDL No results found for: LDLDIRECT    Radiology: Dg Chest 2 View  01/14/2014   CLINICAL DATA:  Shortness of breath  EXAM: CHEST  2 VIEW  COMPARISON:  None.  FINDINGS: Cardiac shadow is  within normal limits. Small bilateral pleural effusions are noted. Underlying atelectasis is present as well. No focal confluent infiltrate is seen.  IMPRESSION: Bilateral small effusions with atelectasis.   Electronically Signed   By: Inez Catalina M.D.   On: 01/14/2014 14:50   US Abdomen Complete  01/14/2014   CLINICAL DATA:  Initial encounter for elevated LFTs.  EXAM: ULTRASOUND ABDOMEN COMPLETE  COMPARISON:  None.  FINDINGS: Gallbladder: No gallstones or wall thickening visualized. Tiny cholesterol polyp noted. No sonographic Murphy sign noted.  Common bile duct: Diameter: Nondilated at 3 mm.  Liver: No focal lesion identified. Within normal limits in parenchymal echogenicity.  IVC: No abnormality visualized.  Pancreas: Visualized portion unremarkable.  Spleen: Size and appearance within normal limits.  Right Kidney: Length: 11.3 cm. Increased renal cortical echogenicity noted. No hydronephrosis.  Left Kidney: Length: 9.9 cm. Also with increased echogenicity. No hydronephrosis.  Abdominal aorta: No aneurysm visualized.  Other findings: Moderate to large bilateral pleural effusions are evident. There it is associated mild to moderate ascites.  IMPRESSION: No evidence for biliary duct dilatation.  Moderate to large bilateral pleural effusions with mild to moderate ascites.  Increased renal echogenicity bilaterally suggests medical renal disease.   Electronically Signed   By: Misty Stanley M.D.   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  EF very poor with elevated filling pressures.  Continue bid iv lasix.  Continue coreg and add nitrates.  Will defer to renal as to wether ACE would  Be of benefit.  Given how bad his heart is would favor in hospital fistula placement this admission as he is likely to need dialysis for ascites, edema and CHF within next 6 months if not now Once on dialysis can consider  right and left cath.  Not a candidate for MRI with gadolinium due to renal failure and this is the only non invasive cardiac test for amyloid.  Consider other diagnositc Tests such as renal biopsy or fat pad biopsy.   HTN:  D/C amlodipine continue beta blocker and lasix  Add nitrates for preload reduction and CHF DM:  SS insulin check A1c  Signed: Jenkins Rouge 01/15/2014, 12:54 PM

## 2014-01-16 LAB — RENAL FUNCTION PANEL
ANION GAP: 6 (ref 5–15)
Albumin: 2.6 g/dL — ABNORMAL LOW (ref 3.5–5.2)
BUN: 59 mg/dL — AB (ref 6–23)
CO2: 21 mmol/L (ref 19–32)
Calcium: 7.2 mg/dL — ABNORMAL LOW (ref 8.4–10.5)
Chloride: 107 mEq/L (ref 96–112)
Creatinine, Ser: 5.63 mg/dL — ABNORMAL HIGH (ref 0.50–1.35)
GFR calc non Af Amer: 10 mL/min — ABNORMAL LOW (ref >90)
GFR, EST AFRICAN AMERICAN: 12 mL/min — AB (ref >90)
GLUCOSE: 92 mg/dL (ref 70–99)
POTASSIUM: 3.6 mmol/L (ref 3.5–5.1)
Phosphorus: 4.2 mg/dL (ref 2.3–4.6)
Sodium: 134 mmol/L — ABNORMAL LOW (ref 135–145)

## 2014-01-16 LAB — PARATHYROID HORMONE, INTACT (NO CA): PTH: 386 pg/mL — ABNORMAL HIGH (ref 14–64)

## 2014-01-16 LAB — GLUCOSE, CAPILLARY
Glucose-Capillary: 95 mg/dL (ref 70–99)
Glucose-Capillary: 127 mg/dL — ABNORMAL HIGH (ref 70–99)
Glucose-Capillary: 97 mg/dL (ref 70–99)
Glucose-Capillary: 144 mg/dL — ABNORMAL HIGH (ref 70–99)

## 2014-01-16 LAB — FOLATE RBC: RBC Folate: 1126 ng/mL — ABNORMAL HIGH

## 2014-01-16 MED ORDER — VITAMINS A & D EX OINT
TOPICAL_OINTMENT | CUTANEOUS | Status: AC
  Administered 2014-01-16: 5
  Filled 2014-01-16: qty 5

## 2014-01-16 NOTE — Progress Notes (Signed)
    SUBJECTIVE:  No SOB.  No pain.     PHYSICAL EXAM Filed Vitals:   01/15/14 0403 01/15/14 1314 01/15/14 2045 01/16/14 0513  BP: 157/105 158/94 152/92 152/88  Pulse: 76 78 79 78  Temp: 98.3 F (36.8 C) 97.5 F (36.4 C) 97.8 F (36.6 C) 98.7 F (37.1 C)  TempSrc: Oral Oral Oral Oral  Resp: 20 18 18 18   Height:      Weight: 228 lb 11.2 oz (103.738 kg)   222 lb 14.2 oz (101.1 kg)  SpO2: 98% 97% 100% 98%   General:  No distress Lungs:  Clear Heart:  RRR Abdomen:  Positive bowel sounds, no rebound no guarding Extremities:  Moderate edema   LABS:  Results for orders placed or performed during the hospital encounter of 01/13/14 (from the past 24 hour(s))  Glucose, capillary     Status: Abnormal   Collection Time: 01/15/14  7:39 AM  Result Value Ref Range   Glucose-Capillary 110 (H) 70 - 99 mg/dL  Glucose, capillary     Status: Abnormal   Collection Time: 01/15/14 11:45 AM  Result Value Ref Range   Glucose-Capillary 139 (H) 70 - 99 mg/dL  Glucose, capillary     Status: Abnormal   Collection Time: 01/15/14  6:04 PM  Result Value Ref Range   Glucose-Capillary 128 (H) 70 - 99 mg/dL  Glucose, capillary     Status: Abnormal   Collection Time: 01/15/14  9:15 PM  Result Value Ref Range   Glucose-Capillary 126 (H) 70 - 99 mg/dL  Renal function panel     Status: Abnormal   Collection Time: 01/16/14  4:50 AM  Result Value Ref Range   Sodium 134 (L) 135 - 145 mmol/L   Potassium 3.6 3.5 - 5.1 mmol/L   Chloride 107 96 - 112 mEq/L   CO2 21 19 - 32 mmol/L   Glucose, Bld 92 70 - 99 mg/dL   BUN 59 (H) 6 - 23 mg/dL   Creatinine, Ser 5.63 (H) 0.50 - 1.35 mg/dL   Calcium 7.2 (L) 8.4 - 10.5 mg/dL   Phosphorus 4.2 2.3 - 4.6 mg/dL   Albumin 2.6 (L) 3.5 - 5.2 g/dL   GFR calc non Af Amer 10 (L) >90 mL/min   GFR calc Af Amer 12 (L) >90 mL/min   Anion gap 6 5 - 15    Intake/Output Summary (Last 24 hours) at 01/16/14 0727 Last data filed at 01/16/14 0516  Gross per 24 hour  Intake     960 ml  Output   2500 ml  Net  -1540 ml     ASSESSMENT AND PLAN:  ACUTE SYSTOLIC HF:  EF 0000000.  Good UO.  Not sure if I/Os complete.  Continue current meds including IV Lasix.  We will plan a right and left cath once he has progressed to be on dialysis.    CKD:  Plans per Dr. Justin Mend.    Jeneen Rinks [facility] 01/16/2014 7:27 AM

## 2014-01-16 NOTE — Progress Notes (Signed)
[facility] ROUNDING NOTE   Subjective:   Interval History: discussed dialysis and the fact that he will need Kidney options class and placement of AVF as an outpatient  Objective:  Vital signs in last 24 hours:  Temp:  [97.5 F (36.4 C)-98.7 F (37.1 C)] 98.7 F (37.1 C) (12/23 0513) Pulse Rate:  [78-79] 78 (12/23 0513) Resp:  [18] 18 (12/23 0513) BP: (152-158)/(88-94) 152/88 mmHg (12/23 0513) SpO2:  [97 %-100 %] 98 % (12/23 0513) Weight:  [101.1 kg (222 lb 14.2 oz)] 101.1 kg (222 lb 14.2 oz) (12/23 0513)  Weight change: -2.638 kg (-5 lb 13 oz) Filed Weights   01/13/14 1601 01/15/14 0403 01/16/14 0513  Weight: 100.699 kg (222 lb) 103.738 kg (228 lb 11.2 oz) 101.1 kg (222 lb 14.2 oz)    Intake/Output: I/O last 3 completed shifts: In: 960 [P.O.:960] Out: 3225 [Urine:3225]   Intake/Output this shift:  Total I/O In: 460 [P.O.:460] Out: 600 [Urine:600]  CVS- RRR RS- CTA ABD- BS present soft non-distended EXT- trace edema   Basic Metabolic Panel:  Recent Labs Lab 01/13/14 1350 01/14/14 0408 01/15/14 0415 01/16/14 0450  NA 139 138 139 134*  K 4.4 3.9 4.0 3.6  CL 103 104 109 107  CO2 19 17* 21 21  GLUCOSE 110* 95 147* 92  BUN 54* 54* 58* 59*  CREATININE 5.94* 5.87* 5.66* 5.63*  CALCIUM 8.3* 7.9* 7.7* 7.2*  PHOS  --   --  4.4 4.2    Liver Function Tests:  Recent Labs Lab 01/13/14 1350 01/15/14 0415 01/16/14 0450  AST 48*  --   --   ALT 72*  --   --   ALKPHOS 236*  --   --   BILITOT 0.4  --   --   PROT 6.5  --   --   ALBUMIN 2.9* 2.9* 2.6*    Recent Labs Lab 01/13/14 1350  LIPASE 16   No results for input(s): AMMONIA in the last 168 hours.  CBC:  Recent Labs Lab 01/13/14 1350 01/14/14 0408  WBC 9.0 7.0  NEUTROABS 6.8  --   HGB 10.9* 9.8*  HCT 33.8* 30.0*  MCV 83.5 83.6  PLT 331 318    Cardiac Enzymes:  Recent Labs Lab 01/13/14 1650 01/13/14 2208 01/14/14 0408  TROPONINI <0.30 <0.30 <0.30    BNP: Invalid  input(s): POCBNP  CBG:  Recent Labs Lab 01/15/14 0739 01/15/14 1145 01/15/14 1804 01/15/14 2115 01/16/14 0739  GLUCAP 110* 139* 128* 126* 95    Microbiology: No results found for this or any previous visit.  Coagulation Studies: No results for input(s): LABPROT, INR in the last 72 hours.  Urinalysis:  Recent Labs  01/13/14 1415  COLORURINE YELLOW  LABSPEC 1.017  PHURINE 5.0  GLUCOSEU NEGATIVE  HGBUR MODERATE*  BILIRUBINUR NEGATIVE  KETONESUR NEGATIVE  PROTEINUR >300*  UROBILINOGEN 0.2  NITRITE NEGATIVE  LEUKOCYTESUR NEGATIVE      Imaging: Dg Chest 2 View  01/14/2014   CLINICAL DATA:  Shortness of breath  EXAM: CHEST  2 VIEW  COMPARISON:  None.  FINDINGS: Cardiac shadow is within normal limits. Small bilateral pleural effusions are noted. Underlying atelectasis is present as well. No focal confluent infiltrate is seen.  IMPRESSION: Bilateral small effusions with atelectasis.   Electronically Signed   By: Inez Catalina M.D.   On: 01/14/2014 14:50     Medications:     . aspirin  81 mg Oral Daily  . carvedilol  6.25 mg Oral  BID WC  . furosemide  80 mg Intravenous BID  . heparin  5,000 Units Subcutaneous 3 times per day  . insulin aspart  0-9 Units Subcutaneous TID WC  . isosorbide dinitrate  10 mg Oral TID  . sodium chloride  3 mL Intravenous Q12H   acetaminophen **OR** acetaminophen, ondansetron **OR** ondansetron (ZOFRAN) IV  Assessment/ Plan:   CKD stage 5  Needs kidney options and will need access placement  Anemia  Low iron start oral iron  Bones PTH pending  SPEP and UPEP   Pending    LOS: 3 Kyle [name] W @TODAY @11 :31 AM

## 2014-01-16 NOTE — Progress Notes (Signed)
Pharmacist Heart Failure Core Measure Documentation  Assessment: Kyle [name] has an EF documented as 15%.  Rationale: Heart failure patients with left ventricular systolic dysfunction (LVSD) and an EF < 40% should be prescribed an angiotensin converting enzyme inhibitor (ACEI) or angiotensin receptor blocker (ARB) at discharge unless a contraindication is documented in the medical record.  This patient is not currently on an ACEI or ARB for HF.  This note is being placed in the record in order to provide documentation that a contraindication to the use of these agents is present for this encounter.  ACE Inhibitor or Angiotensin Receptor Blocker is contraindicated (specify all that apply)  []   ACEI allergy AND ARB allergy []   Angioedema []   Moderate or severe aortic stenosis []   Hyperkalemia []   Hypotension []   Renal artery stenosis [x]   Worsening renal function, preexisting renal disease or dysfunction   Kyle [name] 01/16/2014 1:14 PM

## 2014-01-17 ENCOUNTER — Telehealth: Payer: Self-pay | Admitting: Internal Medicine

## 2014-01-17 DIAGNOSIS — I10 Essential (primary) hypertension: Secondary | ICD-10-CM

## 2014-01-17 LAB — GLUCOSE, CAPILLARY
Glucose-Capillary: 85 mg/dL (ref 70–99)
Glucose-Capillary: 112 mg/dL — ABNORMAL HIGH (ref 70–99)

## 2014-01-17 LAB — PROTEIN ELECTROPHORESIS, SERUM
ALPHA-1-GLOBULIN: 8.7 % — AB (ref 2.9–4.9)
ALPHA-2-GLOBULIN: 9.4 % (ref 7.1–11.8)
Albumin ELP: 49.4 % — ABNORMAL LOW (ref 55.8–66.1)
BETA GLOBULIN: 7 % (ref 4.7–7.2)
Beta 2: 7.5 % — ABNORMAL HIGH (ref 3.2–6.5)
GAMMA GLOBULIN: 18 % (ref 11.1–18.8)
M-SPIKE, %: NOT DETECTED g/dL
TOTAL PROTEIN ELP: 5.4 g/dL — AB (ref 6.0–8.3)

## 2014-01-17 MED ORDER — ISOSORBIDE DINITRATE 20 MG PO TABS: 20.0000 mg | ORAL_TABLET | Freq: Three times a day (TID) | ORAL | Status: DC

## 2014-01-17 MED ORDER — ASPIRIN 81 MG PO CHEW
81.0000 mg | CHEWABLE_TABLET | Freq: Every day | ORAL | Status: DC
Start: 2014-01-17 — End: 2015-01-01

## 2014-01-17 MED ORDER — ISOSORB DINITRATE-HYDRALAZINE 20-37.5 MG PO TABS
1.0000 | ORAL_TABLET | Freq: Three times a day (TID) | ORAL | Status: DC
  Administered 2014-01-17: 1 via ORAL
  Filled 2014-01-17 (×2): qty 1

## 2014-01-17 MED ORDER — FERROUS SULFATE 325 (65 FE) MG PO TABS: 325.0000 mg | ORAL_TABLET | Freq: Every day | ORAL | Status: DC

## 2014-01-17 MED ORDER — FUROSEMIDE 40 MG PO TABS
80.0000 mg | ORAL_TABLET | Freq: Two times a day (BID) | ORAL | Status: DC
  Administered 2014-01-17: 80 mg via ORAL
  Filled 2014-01-17: qty 2

## 2014-01-17 MED ORDER — CALCITRIOL 0.25 MCG PO CAPS
0.2500 ug | ORAL_CAPSULE | ORAL | Status: DC
  Administered 2014-01-17: 0.25 ug via ORAL
  Filled 2014-01-17 (×2): qty 1

## 2014-01-17 MED ORDER — GLIMEPIRIDE 2 MG PO TABS
2.0000 mg | ORAL_TABLET | Freq: Every day | ORAL | Status: DC
Start: 2014-01-17 — End: 2020-03-27

## 2014-01-17 MED ORDER — ISOSORB DINITRATE-HYDRALAZINE 20-37.5 MG PO TABS: 1.0000 | ORAL_TABLET | Freq: Three times a day (TID) | ORAL | Status: DC

## 2014-01-17 MED ORDER — CARVEDILOL 12.5 MG PO TABS: 6.2500 mg | ORAL_TABLET | Freq: Two times a day (BID) | ORAL | Status: DC

## 2014-01-17 MED ORDER — CALCITRIOL 0.25 MCG PO CAPS: 0.2500 ug | ORAL_CAPSULE | ORAL | Status: DC

## 2014-01-17 MED ORDER — HYDRALAZINE HCL 25 MG PO TABS: 37.5000 mg | ORAL_TABLET | Freq: Three times a day (TID) | ORAL | Status: DC

## 2014-01-17 MED ORDER — FUROSEMIDE 80 MG PO TABS: 80.0000 mg | ORAL_TABLET | Freq: Three times a day (TID) | ORAL | Status: DC

## 2014-01-17 NOTE — Progress Notes (Signed)
[facility] ROUNDING NOTE   Subjective:    Interval History:  Hoping to come home  Objective:  Vital signs in last 24 hours:  Temp:  [97.7 F (36.5 C)-98.7 F (37.1 C)] 97.7 F (36.5 C) (12/24 0421) Pulse Rate:  [75-79] 79 (12/24 0421) Resp:  [18] 18 (12/24 0421) BP: (162-168)/(90-110) 168/109 mmHg (12/24 0421) SpO2:  [99 %-100 %] 100 % (12/23 2020) Weight:  [99.9 kg (220 lb 3.8 oz)] 99.9 kg (220 lb 3.8 oz) (12/24 0421)  Weight change: -1.2 kg (-2 lb 10.3 oz) Filed Weights   01/15/14 0403 01/16/14 0513 01/17/14 0421  Weight: 103.738 kg (228 lb 11.2 oz) 101.1 kg (222 lb 14.2 oz) 99.9 kg (220 lb 3.8 oz)    Intake/Output: I/O last 3 completed shifts: In: [id number] [P.O.:1420] Out: [id number] [Urine:4910]   Intake/Output this shift:     CVS- RRR RS- CTA ABD- BS present soft non-distended EXT- trace edema   Basic Metabolic Panel:  Recent Labs Lab 01/13/14 1350 01/14/14 0408 01/15/14 0415 01/16/14 0450  NA 139 138 139 134*  K 4.4 3.9 4.0 3.6  CL 103 104 109 107  CO2 19 17* 21 21  GLUCOSE 110* 95 147* 92  BUN 54* 54* 58* 59*  CREATININE 5.94* 5.87* 5.66* 5.63*  CALCIUM 8.3* 7.9* 7.7* 7.2*  PHOS  --   --  4.4 4.2    Liver Function Tests:  Recent Labs Lab 01/13/14 1350 01/15/14 0415 01/16/14 0450  AST 48*  --   --   ALT 72*  --   --   ALKPHOS 236*  --   --   BILITOT 0.4  --   --   PROT 6.5  --   --   ALBUMIN 2.9* 2.9* 2.6*    Recent Labs Lab 01/13/14 1350  LIPASE 16   No results for input(s): AMMONIA in the last 168 hours.  CBC:  Recent Labs Lab 01/13/14 1350 01/14/14 0408  WBC 9.0 7.0  NEUTROABS 6.8  --   HGB 10.9* 9.8*  HCT 33.8* 30.0*  MCV 83.5 83.6  PLT 331 318    Cardiac Enzymes:  Recent Labs Lab 01/13/14 1650 01/13/14 2208 01/14/14 0408  TROPONINI <0.30 <0.30 <0.30    BNP: Invalid input(s): POCBNP  CBG:  Recent Labs Lab 01/16/14 0739 01/16/14 1152 01/16/14 1724 01/16/14 2112 01/17/14 0737  GLUCAP 95  127* 97 144* 85    Microbiology: No results found for this or any previous visit.  Coagulation Studies: No results for input(s): LABPROT, INR in the last 72 hours.  Urinalysis: No results for input(s): COLORURINE, LABSPEC, PHURINE, GLUCOSEU, HGBUR, BILIRUBINUR, KETONESUR, PROTEINUR, UROBILINOGEN, NITRITE, LEUKOCYTESUR in the last 72 hours.  Invalid input(s): APPERANCEUR    Imaging: No results found.   Medications:     . aspirin  81 mg Oral Daily  . carvedilol  6.25 mg Oral BID WC  . furosemide  80 mg Oral BID  . heparin  5,000 Units Subcutaneous 3 times per day  . insulin aspart  0-9 Units Subcutaneous TID WC  . isosorbide-hydrALAZINE  1 tablet Oral TID  . sodium chloride  3 mL Intravenous Q12H   acetaminophen **OR** acetaminophen, ondansetron **OR** ondansetron (ZOFRAN) IV  Assessment/ Plan:   CKD stage 5 Needs kidney options and will need access placement  Anemia Low iron start oral iron  Bones PTH 386 start rocaltrol 0.25 MWF  SPEP  negative   LOS: 4 Keishia Ground W @TODAY @10 :38 AM

## 2014-01-17 NOTE — Progress Notes (Signed)
Spoke with pt concerning discharge plans. Pt states that he has an appointment with [facility] on Tues, [facility] and there are no other needs at present time.

## 2014-01-17 NOTE — Discharge Summary (Signed)
Physician Discharge Summary  Kyle [name] [id number] DOB: 1955/07/30 DOA: 01/13/2014  PCP: No primary care provider on file.  Admit date: 01/13/2014 Discharge date: 01/17/2014  Time spent: >30 minutes  Recommendations for Outpatient Follow-up:  CMET to follow electrolytes, liver function and renal function CBC to follow Hgb trend Reassess BP and adjust medications as needed  Discharge Diagnoses:  Active Problems:   Acute kidney failure   Acute on chronic renal failure   Lower extremity edema   Transaminasemia   Dyspnea on exertion   Pleural effusion   Diabetes mellitus with nephropathy   Acute combined systolic and diastolic CHF, NYHA class 2   CKD (chronic kidney disease) stage 5, GFR less than 15 ml/min   Discharge Condition: stable and improved. Will d/c home. Patient to follow with renal service and cardiology as an outpatient.  Diet recommendation: low carbohydrates and low sodium diet (< 2grams daily)  Filed Weights   01/15/14 0403 01/16/14 0513 01/17/14 0421  Weight: 103.738 kg (228 lb 11.2 oz) 101.1 kg (222 lb 14.2 oz) 99.9 kg (220 lb 3.8 oz)    History of present illness:  59 year old male with a history of diabetes mellitus and hypertension presented to the emergency department from urgent care when he was told that he had renal failure. The patient has been complaining of lower abdominal and lower extremity edema that has worsened in the past 2 weeks. In addition, he has had some dyspnea on exertion and decreased exercise tolerance over the past several weeks. He denies any fevers, chills, chest pain, dizziness, diaphoresis, vomiting, diarrhea, headaches, visual disturbance, focal extremity weakness. The patient previously took lisinopril, amlodipine, and glipizide, but he states that he has not taken any medications for over one year. In addition, the patient has not had any blood work for at least 2 years. He denies any hematochezia, melena, hemoptysis. He  states that he has gained approximately 20 pounds in the last month. In the emergency department, the patient was noted to have a blood pressure 187/129.  Hospital Course:  Acute systolic and diastolic CHF -[id number] echo EF 0000000, grade 2 diastolic dysfxn, dilated RV, mod TR -transition lasix to 80mg  TID -cardiology plans are for cath once on HD -daily weight and low sodium diet -Remains clinically fluid overloaded; but he wants to go home -will add bidil for better BP control and as part of regimen of his HF -no ACE or ARB given renal failure -continue b-blocker  CKD stage 5 -given the patient's clinical history,he has had a degree of chronic renal insufficiency for a period of time -Full abdominal ultrasound--echogenic kidneys with bilateral pleural effusion and ascites -Urinalysis negative for pyuria, but showed >300mg /dL proteinuria -low sodium diet -daily weight -appreciate nephrology--discussed case with Dr. Justin Mend -plan is to transition to PO lasix and discharge home in am (12/24) Close follow up with renal service for access, kidney videos education and preparation for dialysis   Lower extremity edema/Proteinuria -Multifactorial including the patient's proteinuria/hypoalbuminemia, renal failure and cardiac dysfunction -elevated LFTs suggesting hepatic congestion -daily weight and low sodium diet recommended -checked urine protein/creatinine ratio--3.49  Transaminasemia -suspect hepatic congestion vs steatosis -Hepatitis B surface antigen, hepatitis C antibody--neg -HIV antibody--neg -close follow up in outpatient setting  Diabetes mellitus type 2 -Check hemoglobin A1c--6.5 -Novolog sliding-scale -advise to follow a low carb diet -will discharge on amaryl 2mg  daily  Accelerated uncontrolled Hypertension -will discharge on coreg 12.5mg  BID, bidil 1 tablet TID and lasix 80mg  TID -will not restart lisinopril  given renal failure  Anemia of chronic disease/CKD -Iron  studies demonstrating low iron--started on PO iron supplementation -Serum B12, RBC folate--WNL  Hyperparathyroidism: PTH 386 -will use carcitriol three times per week  Procedures:  See below for x-ray reports  2-D echo: with EF 15%, diffuse hypokinesis  Consultations:  Cardiology  Renal service   Discharge Exam: Filed Vitals:   01/17/14 0421  BP: 168/109  Pulse: 79  Temp: 97.7 F (36.5 C)  Resp: 18    General: Pt is alert, follows commands appropriately, not in acute distress. Wants to go home  HEENT: No icterus, No thrush, [place]/AT  Cardiovascular: RRR, S1/S2, no rubs, no gallops  Respiratory: no crackles. No wheezing. Improved air movement with activity  Abdomen: Soft/+BS, non tender, non distended, no guarding  Extremities: 2-3+LEedema, No lymphangitis, No petechiae, No rashes, no synovitis   Discharge Instructions  Discharge Instructions    Diet - low sodium heart healthy    Complete by:  As directed      Discharge instructions    Complete by:  As directed   Take medications as prescribed Follow with nephrologist Dr. Justin Mend as instructed Follow a low carbohydrates and low sodium diet Be compliant with appointment set up to establish care with PCP          Current Discharge Medication List    START taking these medications   Details  aspirin 81 MG chewable tablet Chew 1 tablet (81 mg total) by mouth daily.    calcitRIOL (ROCALTROL) 0.25 MCG capsule Take 1 capsule (0.25 mcg total) by mouth every Monday, Wednesday, and Friday. Qty: 30 capsule, Refills: 1    carvedilol (COREG) 12.5 MG tablet Take 0.5 tablets (6.25 mg total) by mouth 2 (two) times daily with a meal. Qty: 60 tablet, Refills: 1    ferrous sulfate 325 (65 FE) MG tablet Take 1 tablet (325 mg total) by mouth daily with breakfast. Qty: 30 tablet, Refills: 3    furosemide (LASIX) 80 MG tablet Take 1 tablet (80 mg total) by mouth 3 (three) times daily. Qty: 90 tablet, Refills: 1     glimepiride (AMARYL) 2 MG tablet Take 1 tablet (2 mg total) by mouth daily with breakfast. Qty: 30 tablet, Refills: 1    isosorbide-hydrALAZINE (BIDIL) 20-37.5 MG per tablet Take 1 tablet by mouth 3 (three) times daily. Qty: 90 tablet, Refills: 1      CONTINUE these medications which have NOT CHANGED   Details  vitamin C (ASCORBIC ACID) 500 MG tablet Take 500 mg by mouth daily.      STOP taking these medications     amLODipine (NORVASC) 5 MG tablet      B Complex Vitamins (VITAMIN-B COMPLEX PO)      BucAlfAspKGlucCouchParsUvaUrJu (WATER PILLS PO)      ECHINACEA PO      glipiZIDE (GLUCOTROL) 10 MG tablet      lisinopril (PRINIVIL,ZESTRIL) 10 MG tablet        No Known Allergies Follow-up Information    Follow up with [facility]     On 01/22/2014.   Why:  @11 :15a/$20 co pay/bring photo id/meds in bottle   Contact information:   [address]  [place] [phone]      Follow up with Sherril Croon, MD. Call in 1 week.   Specialty:  Nephrology   Why:  call office to set up follow up appointment    Contact information:   [place]  [place] [address] [phone]       The results of significant diagnostics from this hospitalization (including imaging, microbiology, ancillary and laboratory) are listed below for reference.    Significant Diagnostic Studies: Dg Chest 2 View  01/14/2014   CLINICAL DATA:  Shortness of breath  EXAM: CHEST  2 VIEW  COMPARISON:  None.  FINDINGS: Cardiac shadow is within normal limits. Small bilateral pleural effusions are noted. Underlying atelectasis is present as well. No focal confluent infiltrate is seen.  IMPRESSION: Bilateral small effusions with atelectasis.   Electronically Signed   By: Inez Catalina M.D.   On: 01/14/2014 14:50   US Abdomen Complete  01/14/2014   CLINICAL DATA:  Initial encounter for elevated LFTs.  EXAM: ULTRASOUND ABDOMEN COMPLETE  COMPARISON:   None.  FINDINGS: Gallbladder: No gallstones or wall thickening visualized. Tiny cholesterol polyp noted. No sonographic Murphy sign noted.  Common bile duct: Diameter: Nondilated at 3 mm.  Liver: No focal lesion identified. Within normal limits in parenchymal echogenicity.  IVC: No abnormality visualized.  Pancreas: Visualized portion unremarkable.  Spleen: Size and appearance within normal limits.  Right Kidney: Length: 11.3 cm. Increased renal cortical echogenicity noted. No hydronephrosis.  Left Kidney: Length: 9.9 cm. Also with increased echogenicity. No hydronephrosis.  Abdominal aorta: No aneurysm visualized.  Other findings: Moderate to large bilateral pleural effusions are evident. There it is associated mild to moderate ascites.  IMPRESSION: No evidence for biliary duct dilatation.  Moderate to large bilateral pleural effusions with mild to moderate ascites.  Increased renal echogenicity bilaterally suggests medical renal disease.   Electronically Signed   By: Misty Stanley M.D.   On: 01/14/2014 09:17   Labs: Basic Metabolic Panel:  Recent Labs Lab 01/13/14 1350 01/14/14 0408 01/15/14 0415 01/16/14 0450  NA 139 138 139 134*  K 4.4 3.9 4.0 3.6  CL 103 104 109 107  CO2 19 17* 21 21  GLUCOSE 110* 95 147* 92  BUN 54* 54* 58* 59*  CREATININE 5.94* 5.87* 5.66* 5.63*  CALCIUM 8.3* 7.9* 7.7* 7.2*  PHOS  --   --  4.4 4.2   Liver Function Tests:  Recent Labs Lab 01/13/14 1350 01/15/14 0415 01/16/14 0450  AST 48*  --   --   ALT 72*  --   --   ALKPHOS 236*  --   --   BILITOT 0.4  --   --   PROT 6.5  --   --   ALBUMIN 2.9* 2.9* 2.6*    Recent Labs Lab 01/13/14 1350  LIPASE 16   CBC:  Recent Labs Lab 01/13/14 1350 01/14/14 0408  WBC 9.0 7.0  NEUTROABS 6.8  --   HGB 10.9* 9.8*  HCT 33.8* 30.0*  MCV 83.5 83.6  PLT 331 318   Cardiac Enzymes:  Recent Labs Lab 01/13/14 1650 01/13/14 2208 01/14/14 0408  TROPONINI <0.30 <0.30 <0.30   BNP: BNP (last 3  results)  Recent Labs  01/14/14 1800  PROBNP 54690.0*   CBG:  Recent Labs Lab 01/16/14 0739 01/16/14 1152 01/16/14 1724 01/16/14 2112 01/17/14 0737  GLUCAP 95 127* 97 144* 85    Signed:  Barton Dubois  [facility] 01/17/2014, 12:27 PM

## 2014-01-17 NOTE — Progress Notes (Signed)
    SUBJECTIVE:  No SOB.  No pain.     PHYSICAL EXAM Filed Vitals:   01/16/14 1430 01/16/14 1846 01/16/14 2020 01/17/14 0421  BP: 162/90 165/97 164/110 168/109  Pulse: 77  75 79  Temp: 98.7 F (37.1 C)  97.7 F (36.5 C) 97.7 F (36.5 C)  TempSrc: Oral  Oral Oral  Resp: 18  18 18   Height:      Weight:    220 lb 3.8 oz (99.9 kg)  SpO2: 99%  100%    General:  No distress Lungs:  Clear Heart:  RRR Abdomen:  Positive bowel sounds, no rebound no guarding Extremities:  Moderate edema   LABS:  Results for orders placed or performed during the hospital encounter of 01/13/14 (from the past 24 hour(s))  Glucose, capillary     Status: Abnormal   Collection Time: 01/16/14 11:52 AM  Result Value Ref Range   Glucose-Capillary 127 (H) 70 - 99 mg/dL  Glucose, capillary     Status: None   Collection Time: 01/16/14  5:24 PM  Result Value Ref Range   Glucose-Capillary 97 70 - 99 mg/dL  Glucose, capillary     Status: Abnormal   Collection Time: 01/16/14  9:12 PM  Result Value Ref Range   Glucose-Capillary 144 (H) 70 - 99 mg/dL  Glucose, capillary     Status: None   Collection Time: 01/17/14  7:37 AM  Result Value Ref Range   Glucose-Capillary 85 70 - 99 mg/dL    Intake/Output Summary (Last 24 hours) at 01/17/14 0753 Last data filed at 01/17/14 0422  Gross per 24 hour  Intake   1180 ml  Output   3110 ml  Net  -1930 ml     ASSESSMENT AND PLAN:  ACUTE SYSTOLIC HF:  EF 0000000.  Good UO.  Lasix was switched to PO.  He still has significant volume excess.  We will plan a right and left cath once he has progressed to be on dialysis.  Weight down 2 lbs from yesterday.   It looks like BiDil has been ordered to start today.      CKD:  Plans per Dr. Justin Mend.    HTN:  This is will be managed in the context of treating his CHF  Minus Breeding 01/17/2014 7:53 AM

## 2014-01-17 NOTE — Telephone Encounter (Signed)
Patient needs Bidil but no open pharmacies with this med (Christmas eve).  Will go ahead and order scripts for isosorbide and hydralazine separately at the [facility] in [place].

## 2014-01-17 NOTE — Progress Notes (Signed)
PROGRESS NOTE  Kyle [name] [id number] DOB: 05/22/1955 DOA: 01/13/2014 PCP: No primary care provider on file.   Interim  summary 58 year old male with a history of diabetes mellitus and hypertension presented to the emergency department from urgent care when he was told that he had renal failure. The patient has been complaining of lower abdominal and lower extremity edema that has worsened in the past 2 weeks. In addition, he has had some dyspnea on exertion and decreased exercise tolerance over the past several weeks. The patient previously took lisinopril, amlodipine, and glipizide, but he states that he has not taken any medications for over 2 years. In addition, the patient has not had any blood work for at least 2 years. Serum creatinine was 5.94. Bicarbonate was 19. Potassium was 4.4. Hemoglobin was 10.9 on day of admission. The patient was clinically for overloaded and started on intravenous furosemide. Abdominal ultrasound was negative for hydronephrosis but revealed echogenic kidneys as suspected. Echocardiogram was ordered and nephrology was consulted.  Cardiology consulted after Echo results returned with EF 15%.  Assessment/Plan: Acute systolic and diastolic CHF -[id number] echo EF 0000000, grade 2 diastolic dysfxn, dilated RV, mod TR -transition lasix to 80mg  BID; instructions explained to extra dose of lasix base on his weight  -cardiology on board; plan is for cath once on HD -continue accurate I/Os, daily weight and low sodium diet -Remains clinically fluid overloaded -will add bidil -no ACE or ARB given renal failure -continue b-blocker  CKD stage 5 -given the patient's clinical history,he has had a degree of chronic renal insufficiency for a period of time -Full abdominal ultrasound--echogenic kidneys with bilateral pleural effusion and ascites -Urinalysis negative for pyuria, but showed >300mg /dL proteinuria -low sodium diet -daily weight -appreciate  nephrology--discussed case with Dr. Justin Mend -plan is to transition to PO lasix and discharge home in am (12/24) Close follow up with renal service for access, kidney videos education and preparation for dialysis    Lower extremity edema/Proteinuria -Multifactorial including the patient's proteinuria/hypoalbuminemia, renal failure and cardiac dysfunction -elevated LFTs suggesting hepatic congestion -daily weight, strict intake and output and low sodium diet -checked urine protein/creatinine ratio--3.49  Transaminasemia -suspect hepatic congestion vs steatosis -Hepatitis B surface antigen, hepatitis C antibody--neg -HIV antibody--neg -close follow up in outpatient setting  Diabetes mellitus type 2 -Check hemoglobin A1c--6.5 -Novolog sliding-scale -continue low carb diet  Hypertension -continue amlodipine, coreg and lasix -will not restart lisinopril given renal failure -will add bidil for better control and as part of CHF regimen given inability to use lisinopril  Anemia of chronic disease/CKD -Iron studies--started on PO iron supplementation -Serum B12, RBC folate--WNL  Family Communication: wife updated  Disposition Plan: Home when medically stable   Procedures/Studies: Dg Chest 2 View  01/14/2014   CLINICAL DATA:  Shortness of breath  EXAM: CHEST  2 VIEW  COMPARISON:  None.  FINDINGS: Cardiac shadow is within normal limits. Small bilateral pleural effusions are noted. Underlying atelectasis is present as well. No focal confluent infiltrate is seen.  IMPRESSION: Bilateral small effusions with atelectasis.   Electronically Signed   By: Inez Catalina M.D.   On: 01/14/2014 14:50   US Abdomen Complete  01/14/2014   CLINICAL DATA:  Initial encounter for elevated LFTs.  EXAM: ULTRASOUND ABDOMEN COMPLETE  COMPARISON:  None.  FINDINGS: Gallbladder: No gallstones or wall thickening visualized. Tiny cholesterol polyp noted. No sonographic Murphy sign noted.  Common bile duct:  Diameter: Nondilated at 3  mm.  Liver: No focal lesion identified. Within normal limits in parenchymal echogenicity.  IVC: No abnormality visualized.  Pancreas: Visualized portion unremarkable.  Spleen: Size and appearance within normal limits.  Right Kidney: Length: 11.3 cm. Increased renal cortical echogenicity noted. No hydronephrosis.  Left Kidney: Length: 9.9 cm. Also with increased echogenicity. No hydronephrosis.  Abdominal aorta: No aneurysm visualized.  Other findings: Moderate to large bilateral pleural effusions are evident. There it is associated mild to moderate ascites.  IMPRESSION: No evidence for biliary duct dilatation.  Moderate to large bilateral pleural effusions with mild to moderate ascites.  Increased renal echogenicity bilaterally suggests medical renal disease.   Electronically Signed   By: Misty Stanley M.D.   On: 01/14/2014 09:17    Subjective: Patient is breathing better and denies CP or palpitations.  Patient is aware of kidney conditions and need for HD in near future. After discussing with renal service; plan is to transition to PO lasix on 12/24 and discharge home with close follow up.  Objective: Filed Vitals:   01/16/14 1430 01/16/14 1846 01/16/14 2020 01/17/14 0421  BP: 162/90 165/97 164/110 168/109  Pulse: 77  75 79  Temp: 98.7 F (37.1 C)  97.7 F (36.5 C) 97.7 F (36.5 C)  TempSrc: Oral  Oral Oral  Resp: 18  18 18   Height:      Weight:    99.9 kg (220 lb 3.8 oz)  SpO2: 99%  100%     Intake/Output Summary (Last 24 hours) at 01/17/14 [id number] Last data filed at 01/17/14 0422  Gross per 24 hour  Intake   1180 ml  Output   3110 ml  Net  -1930 ml   Weight change: -1.2 kg (-2 lb 10.3 oz) Exam:   General:  Pt is alert, follows commands appropriately, not in acute distress  HEENT: No icterus, No thrush, [place]/AT  Cardiovascular: RRR, S1/S2, no rubs, no gallops  Respiratory: Fine Bibasilar crackles. No wheezing. Improved air movement  Abdomen: Soft/+BS,  non tender, non distended, no guarding  Extremities: 2-3+LEedema, No lymphangitis, No petechiae, No rashes, no synovitis  Data Reviewed: Basic Metabolic Panel:  Recent Labs Lab 01/13/14 1350 01/14/14 0408 01/15/14 0415 01/16/14 0450  NA 139 138 139 134*  K 4.4 3.9 4.0 3.6  CL 103 104 109 107  CO2 19 17* 21 21  GLUCOSE 110* 95 147* 92  BUN 54* 54* 58* 59*  CREATININE 5.94* 5.87* 5.66* 5.63*  CALCIUM 8.3* 7.9* 7.7* 7.2*  PHOS  --   --  4.4 4.2   Liver Function Tests:  Recent Labs Lab 01/13/14 1350 01/15/14 0415 01/16/14 0450  AST 48*  --   --   ALT 72*  --   --   ALKPHOS 236*  --   --   BILITOT 0.4  --   --   PROT 6.5  --   --   ALBUMIN 2.9* 2.9* 2.6*    Recent Labs Lab 01/13/14 1350  LIPASE 16   CBC:  Recent Labs Lab 01/13/14 1350 01/14/14 0408  WBC 9.0 7.0  NEUTROABS 6.8  --   HGB 10.9* 9.8*  HCT 33.8* 30.0*  MCV 83.5 83.6  PLT 331 318   Cardiac Enzymes:  Recent Labs Lab 01/13/14 1650 01/13/14 2208 01/14/14 0408  TROPONINI <0.30 <0.30 <0.30   CBG:  Recent Labs Lab 01/16/14 0739 01/16/14 1152 01/16/14 1724 01/16/14 2112 01/17/14 0737  GLUCAP 95 127* 97 144* 85    Scheduled Meds: . aspirin  81 mg Oral Daily  . carvedilol  6.25 mg Oral BID WC  . furosemide  80 mg Oral BID  . heparin  5,000 Units Subcutaneous 3 times per day  . insulin aspart  0-9 Units Subcutaneous TID WC  . isosorbide dinitrate  10 mg Oral TID  . sodium chloride  3 mL Intravenous Q12H   Continuous Infusions:   Time: 30 minutes  Barton Dubois, MD  [facility] Pager [phone]  If 7PM-7AM, please contact night-coverage [email or web address] Password [email or web address] 01/17/2014, 7:52 AM   LOS: 4 days

## 2014-01-21 LAB — UIFE/LIGHT CHAINS/TP QN, 24-HR UR
Albumin, U: DETECTED
Alpha 1, Urine: DETECTED — AB
Alpha 2, Urine: DETECTED — AB
Beta, Urine: DETECTED — AB
GAMMA UR: DETECTED — AB
TOTAL PROTEIN, URINE-UPE24: 86 mg/dL — AB (ref 5–25)

## 2014-01-21 LAB — IRON AND TIBC
Iron: 29 ug/dL — ABNORMAL LOW (ref 42–135)
Saturation Ratios: 13 % — ABNORMAL LOW (ref 20–55)
TIBC: 215 ug/dL (ref 215–435)
UIBC: 186 ug/dL (ref 125–400)

## 2014-01-22 ENCOUNTER — Inpatient Hospital Stay: Payer: Managed Care, Other (non HMO) | Admitting: Family Medicine

## 2014-03-04 ENCOUNTER — Inpatient Hospital Stay: Payer: Managed Care, Other (non HMO) | Admitting: Internal Medicine

## 2014-08-20 ENCOUNTER — Inpatient Hospital Stay (HOSPITAL_COMMUNITY)
Admission: EM | Admit: 2014-08-20 | Discharge: 2014-09-07 | DRG: 286 | Disposition: A | Discharge status: Home Health | Payer: 59 | Attending: Internal Medicine | Admitting: Internal Medicine

## 2014-08-20 ENCOUNTER — Emergency Department (HOSPITAL_COMMUNITY): Payer: 59

## 2014-08-20 ENCOUNTER — Encounter (HOSPITAL_COMMUNITY): Payer: Self-pay

## 2014-08-20 DIAGNOSIS — I1310 Hypertensive heart and chronic kidney disease without heart failure, with stage 1 through stage 4 chronic kidney disease, or unspecified chronic kidney disease: Secondary | ICD-10-CM | POA: Diagnosis present

## 2014-08-20 DIAGNOSIS — R5381 Other malaise: Secondary | ICD-10-CM | POA: Diagnosis not present

## 2014-08-20 DIAGNOSIS — I428 Other cardiomyopathies: Secondary | ICD-10-CM | POA: Diagnosis present

## 2014-08-20 DIAGNOSIS — E11649 Type 2 diabetes mellitus with hypoglycemia without coma: Secondary | ICD-10-CM | POA: Diagnosis not present

## 2014-08-20 DIAGNOSIS — I429 Cardiomyopathy, unspecified: Secondary | ICD-10-CM | POA: Diagnosis not present

## 2014-08-20 DIAGNOSIS — K5900 Constipation, unspecified: Secondary | ICD-10-CM | POA: Diagnosis present

## 2014-08-20 DIAGNOSIS — Z9119 Patient's noncompliance with other medical treatment and regimen: Secondary | ICD-10-CM | POA: Diagnosis present

## 2014-08-20 DIAGNOSIS — I469 Cardiac arrest, cause unspecified: Secondary | ICD-10-CM | POA: Diagnosis not present

## 2014-08-20 DIAGNOSIS — D649 Anemia, unspecified: Secondary | ICD-10-CM | POA: Diagnosis not present

## 2014-08-20 DIAGNOSIS — N186 End stage renal disease: Secondary | ICD-10-CM | POA: Diagnosis present

## 2014-08-20 DIAGNOSIS — J9690 Respiratory failure, unspecified, unspecified whether with hypoxia or hypercapnia: Secondary | ICD-10-CM

## 2014-08-20 DIAGNOSIS — N179 Acute kidney failure, unspecified: Secondary | ICD-10-CM

## 2014-08-20 DIAGNOSIS — J9601 Acute respiratory failure with hypoxia: Secondary | ICD-10-CM | POA: Diagnosis present

## 2014-08-20 DIAGNOSIS — Z87891 Personal history of nicotine dependence: Secondary | ICD-10-CM

## 2014-08-20 DIAGNOSIS — E041 Nontoxic single thyroid nodule: Secondary | ICD-10-CM | POA: Diagnosis present

## 2014-08-20 DIAGNOSIS — N185 Chronic kidney disease, stage 5: Secondary | ICD-10-CM | POA: Diagnosis not present

## 2014-08-20 DIAGNOSIS — R7989 Other specified abnormal findings of blood chemistry: Secondary | ICD-10-CM

## 2014-08-20 DIAGNOSIS — Z992 Dependence on renal dialysis: Secondary | ICD-10-CM

## 2014-08-20 DIAGNOSIS — I4729 Other ventricular tachycardia: Secondary | ICD-10-CM

## 2014-08-20 DIAGNOSIS — J690 Pneumonitis due to inhalation of food and vomit: Secondary | ICD-10-CM | POA: Diagnosis not present

## 2014-08-20 DIAGNOSIS — I5021 Acute systolic (congestive) heart failure: Secondary | ICD-10-CM

## 2014-08-20 DIAGNOSIS — G9349 Other encephalopathy: Secondary | ICD-10-CM | POA: Diagnosis not present

## 2014-08-20 DIAGNOSIS — J811 Chronic pulmonary edema: Secondary | ICD-10-CM

## 2014-08-20 DIAGNOSIS — N19 Unspecified kidney failure: Secondary | ICD-10-CM

## 2014-08-20 DIAGNOSIS — L8990 Pressure ulcer of unspecified site, unspecified stage: Secondary | ICD-10-CM | POA: Diagnosis present

## 2014-08-20 DIAGNOSIS — R188 Other ascites: Secondary | ICD-10-CM | POA: Diagnosis present

## 2014-08-20 DIAGNOSIS — R778 Other specified abnormalities of plasma proteins: Secondary | ICD-10-CM

## 2014-08-20 DIAGNOSIS — E1165 Type 2 diabetes mellitus with hyperglycemia: Secondary | ICD-10-CM | POA: Diagnosis present

## 2014-08-20 DIAGNOSIS — E1129 Type 2 diabetes mellitus with other diabetic kidney complication: Secondary | ICD-10-CM | POA: Diagnosis present

## 2014-08-20 DIAGNOSIS — I132 Hypertensive heart and chronic kidney disease with heart failure and with stage 5 chronic kidney disease, or end stage renal disease: Secondary | ICD-10-CM | POA: Diagnosis present

## 2014-08-20 DIAGNOSIS — Z8249 Family history of ischemic heart disease and other diseases of the circulatory system: Secondary | ICD-10-CM

## 2014-08-20 DIAGNOSIS — R601 Generalized edema: Secondary | ICD-10-CM | POA: Diagnosis not present

## 2014-08-20 DIAGNOSIS — I10 Essential (primary) hypertension: Secondary | ICD-10-CM | POA: Diagnosis not present

## 2014-08-20 DIAGNOSIS — I472 Ventricular tachycardia: Secondary | ICD-10-CM | POA: Diagnosis not present

## 2014-08-20 DIAGNOSIS — D631 Anemia in chronic kidney disease: Secondary | ICD-10-CM

## 2014-08-20 DIAGNOSIS — E876 Hypokalemia: Secondary | ICD-10-CM | POA: Diagnosis present

## 2014-08-20 DIAGNOSIS — I5043 Acute on chronic combined systolic (congestive) and diastolic (congestive) heart failure: Secondary | ICD-10-CM | POA: Diagnosis present

## 2014-08-20 DIAGNOSIS — IMO0002 Reserved for concepts with insufficient information to code with codable children: Secondary | ICD-10-CM

## 2014-08-20 DIAGNOSIS — E873 Alkalosis: Secondary | ICD-10-CM | POA: Diagnosis present

## 2014-08-20 DIAGNOSIS — Z4659 Encounter for fitting and adjustment of other gastrointestinal appliance and device: Secondary | ICD-10-CM

## 2014-08-20 DIAGNOSIS — N189 Chronic kidney disease, unspecified: Secondary | ICD-10-CM

## 2014-08-20 DIAGNOSIS — Z9289 Personal history of other medical treatment: Secondary | ICD-10-CM

## 2014-08-20 DIAGNOSIS — E1122 Type 2 diabetes mellitus with diabetic chronic kidney disease: Secondary | ICD-10-CM | POA: Diagnosis present

## 2014-08-20 DIAGNOSIS — D638 Anemia in other chronic diseases classified elsewhere: Secondary | ICD-10-CM | POA: Diagnosis not present

## 2014-08-20 DIAGNOSIS — N2581 Secondary hyperparathyroidism of renal origin: Secondary | ICD-10-CM | POA: Diagnosis present

## 2014-08-20 DIAGNOSIS — K567 Ileus, unspecified: Secondary | ICD-10-CM

## 2014-08-20 DIAGNOSIS — I272 Other secondary pulmonary hypertension: Secondary | ICD-10-CM | POA: Diagnosis present

## 2014-08-20 DIAGNOSIS — I2510 Atherosclerotic heart disease of native coronary artery without angina pectoris: Secondary | ICD-10-CM | POA: Diagnosis present

## 2014-08-20 DIAGNOSIS — T82594A Other mechanical complication of infusion catheter, initial encounter: Secondary | ICD-10-CM

## 2014-08-20 DIAGNOSIS — T1490XA Injury, unspecified, initial encounter: Secondary | ICD-10-CM

## 2014-08-20 DIAGNOSIS — Z419 Encounter for procedure for purposes other than remedying health state, unspecified: Secondary | ICD-10-CM

## 2014-08-20 DIAGNOSIS — J9600 Acute respiratory failure, unspecified whether with hypoxia or hypercapnia: Secondary | ICD-10-CM | POA: Diagnosis not present

## 2014-08-20 HISTORY — DX: Type 2 diabetes mellitus with other diabetic kidney complication: E11.29

## 2014-08-20 HISTORY — DX: Reserved for concepts with insufficient information to code with codable children: IMO0002

## 2014-08-20 HISTORY — DX: Other specified abnormal findings of blood chemistry: R79.89

## 2014-08-20 HISTORY — DX: Generalized edema: R60.1

## 2014-08-20 HISTORY — DX: Anemia in chronic kidney disease: D63.1

## 2014-08-20 HISTORY — DX: Other specified abnormalities of plasma proteins: R77.8

## 2014-08-20 HISTORY — DX: Anemia in chronic kidney disease: N18.9

## 2014-08-20 LAB — CBC WITH DIFFERENTIAL/PLATELET
BASOS PCT: 0 % (ref 0–1)
BASOS PCT: 1 % (ref 0–1)
Basophils Absolute: 0 10*3/uL (ref 0.0–0.1)
Basophils Absolute: 0 10*3/uL (ref 0.0–0.1)
EOS ABS: 0 10*3/uL (ref 0.0–0.7)
Eosinophils Absolute: 0.1 10*3/uL (ref 0.0–0.7)
Eosinophils Relative: 1 % (ref 0–5)
Eosinophils Relative: 1 % (ref 0–5)
HCT: 32.3 % — ABNORMAL LOW (ref 39.0–52.0)
HEMATOCRIT: 30.2 % — AB (ref 39.0–52.0)
HEMOGLOBIN: 10 g/dL — AB (ref 13.0–17.0)
Hemoglobin: 10.6 g/dL — ABNORMAL LOW (ref 13.0–17.0)
LYMPHS ABS: 1.1 10*3/uL (ref 0.7–4.0)
LYMPHS PCT: 16 % (ref 12–46)
Lymphocytes Relative: 18 % (ref 12–46)
Lymphs Abs: 1.1 10*3/uL (ref 0.7–4.0)
MCH: 26.7 pg (ref 26.0–34.0)
MCH: 26.2 pg (ref 26.0–34.0)
MCHC: 33.1 g/dL (ref 30.0–36.0)
MCHC: 32.8 g/dL (ref 30.0–36.0)
MCV: 80.7 fL (ref 78.0–100.0)
MCV: 79.8 fL (ref 78.0–100.0)
MONO ABS: 0.6 10*3/uL (ref 0.1–1.0)
Monocytes Absolute: 0.5 10*3/uL (ref 0.1–1.0)
Monocytes Relative: 8 % (ref 3–12)
Monocytes Relative: 8 % (ref 3–12)
NEUTROS ABS: 4.5 10*3/uL (ref 1.7–7.7)
Neutro Abs: 5.1 10*3/uL (ref 1.7–7.7)
Neutrophils Relative %: 73 % (ref 43–77)
Neutrophils Relative %: 74 % (ref 43–77)
Platelets: 291 10*3/uL (ref 150–400)
Platelets: 310 10*3/uL (ref 150–400)
RBC: 3.74 MIL/uL — ABNORMAL LOW (ref 4.22–5.81)
RBC: 4.05 MIL/uL — ABNORMAL LOW (ref 4.22–5.81)
RDW: 18.6 % — AB (ref 11.5–15.5)
RDW: 18.6 % — ABNORMAL HIGH (ref 11.5–15.5)
WBC: 6.1 10*3/uL (ref 4.0–10.5)
WBC: 6.8 10*3/uL (ref 4.0–10.5)

## 2014-08-20 LAB — PHOSPHORUS: Phosphorus: 5.6 mg/dL — ABNORMAL HIGH (ref 2.5–4.6)

## 2014-08-20 LAB — TROPONIN I
Troponin I: 0.05 ng/mL — ABNORMAL HIGH (ref <0.031)
Troponin I: 0.06 ng/mL — ABNORMAL HIGH (ref <0.031)

## 2014-08-20 LAB — COMPREHENSIVE METABOLIC PANEL
ALBUMIN: 2.1 g/dL — AB (ref 3.5–5.0)
ALK PHOS: 154 U/L — AB (ref 38–126)
ALK PHOS: 150 U/L — AB (ref 38–126)
ALT: 50 U/L (ref 17–63)
ALT: 47 U/L (ref 17–63)
ANION GAP: 14 (ref 5–15)
AST: 36 U/L (ref 15–41)
AST: 33 U/L (ref 15–41)
Albumin: 2.2 g/dL — ABNORMAL LOW (ref 3.5–5.0)
Anion gap: 14 (ref 5–15)
BUN: 96 mg/dL — ABNORMAL HIGH (ref 6–20)
BUN: 99 mg/dL — ABNORMAL HIGH (ref 6–20)
CALCIUM: 6.8 mg/dL — AB (ref 8.9–10.3)
CHLORIDE: 105 mmol/L (ref 101–111)
CHLORIDE: 105 mmol/L (ref 101–111)
CO2: 21 mmol/L — ABNORMAL LOW (ref 22–32)
CO2: 19 mmol/L — ABNORMAL LOW (ref 22–32)
Calcium: 6.9 mg/dL — ABNORMAL LOW (ref 8.9–10.3)
Creatinine, Ser: 10.77 mg/dL — ABNORMAL HIGH (ref 0.61–1.24)
Creatinine, Ser: 10.41 mg/dL — ABNORMAL HIGH (ref 0.61–1.24)
GFR calc Af Amer: 5 mL/min — ABNORMAL LOW (ref >60)
GFR calc Af Amer: 5 mL/min — ABNORMAL LOW (ref >60)
GFR calc non Af Amer: 5 mL/min — ABNORMAL LOW (ref >60)
GFR, EST NON AFRICAN AMERICAN: 5 mL/min — AB (ref >60)
Glucose, Bld: 101 mg/dL — ABNORMAL HIGH (ref 65–99)
Glucose, Bld: 99 mg/dL (ref 65–99)
POTASSIUM: 3.7 mmol/L (ref 3.5–5.1)
Potassium: 3.6 mmol/L (ref 3.5–5.1)
SODIUM: 138 mmol/L (ref 135–145)
Sodium: 140 mmol/L (ref 135–145)
TOTAL PROTEIN: 5.6 g/dL — AB (ref 6.5–8.1)
Total Bilirubin: 0.6 mg/dL (ref 0.3–1.2)
Total Bilirubin: 0.7 mg/dL (ref 0.3–1.2)
Total Protein: 5.8 g/dL — ABNORMAL LOW (ref 6.5–8.1)

## 2014-08-20 LAB — TSH: TSH: 2.329 u[IU]/mL (ref 0.350–4.500)

## 2014-08-20 LAB — APTT: aPTT: 36 seconds (ref 24–37)

## 2014-08-20 LAB — PROTIME-INR
INR: 1.28 (ref 0.00–1.49)
PROTHROMBIN TIME: 16.1 s — AB (ref 11.6–15.2)

## 2014-08-20 LAB — MAGNESIUM: Magnesium: 1.6 mg/dL — ABNORMAL LOW (ref 1.7–2.4)

## 2014-08-20 LAB — BRAIN NATRIURETIC PEPTIDE

## 2014-08-20 MED ORDER — FUROSEMIDE 10 MG/ML IJ SOLN
120.0000 mg | Freq: Once | INTRAVENOUS | Status: AC
  Administered 2014-08-20: 120 mg via INTRAVENOUS
  Filled 2014-08-20: qty 12

## 2014-08-20 MED ORDER — CARVEDILOL 6.25 MG PO TABS
6.2500 mg | ORAL_TABLET | Freq: Two times a day (BID) | ORAL | Status: DC
  Administered 2014-08-20 – 2014-08-21 (×2): 6.25 mg via ORAL
  Filled 2014-08-20 (×4): qty 1

## 2014-08-20 MED ORDER — CALCITRIOL 0.25 MCG PO CAPS
0.2500 ug | ORAL_CAPSULE | ORAL | Status: DC
  Administered 2014-08-21: 0.25 ug via ORAL
  Filled 2014-08-20 (×2): qty 1

## 2014-08-20 MED ORDER — FUROSEMIDE 10 MG/ML IJ SOLN
10.0000 mg/h | INTRAVENOUS | Status: DC
  Administered 2014-08-20: 10 mg/h via INTRAVENOUS
  Filled 2014-08-20 (×3): qty 25

## 2014-08-20 MED ORDER — FERROUS SULFATE 325 (65 FE) MG PO TABS
325.0000 mg | ORAL_TABLET | Freq: Every day | ORAL | Status: DC
  Filled 2014-08-20 (×2): qty 1

## 2014-08-20 MED ORDER — SODIUM CHLORIDE 0.9 % IV SOLN
INTRAVENOUS | Status: DC
  Administered 2014-08-20 – 2014-08-21 (×3): via INTRAVENOUS

## 2014-08-20 MED ORDER — ONDANSETRON HCL 4 MG PO TABS: 4.0000 mg | ORAL_TABLET | Freq: Four times a day (QID) | ORAL | Status: DC | PRN

## 2014-08-20 MED ORDER — ACETAMINOPHEN 325 MG PO TABS: 650.0000 mg | ORAL_TABLET | Freq: Four times a day (QID) | ORAL | Status: DC | PRN

## 2014-08-20 MED ORDER — GLIMEPIRIDE 2 MG PO TABS
2.0000 mg | ORAL_TABLET | Freq: Every day | ORAL | Status: DC
  Filled 2014-08-20 (×2): qty 1

## 2014-08-20 MED ORDER — ASPIRIN 81 MG PO CHEW
81.0000 mg | CHEWABLE_TABLET | Freq: Every day | ORAL | Status: DC
  Administered 2014-08-20: 81 mg via ORAL
  Filled 2014-08-20 (×2): qty 1

## 2014-08-20 MED ORDER — ONDANSETRON HCL 4 MG/2ML IJ SOLN: 4.0000 mg | Freq: Four times a day (QID) | INTRAMUSCULAR | Status: DC | PRN

## 2014-08-20 MED ORDER — ACETAMINOPHEN 650 MG RE SUPP: 650.0000 mg | Freq: Four times a day (QID) | RECTAL | Status: DC | PRN

## 2014-08-20 MED ORDER — SODIUM CHLORIDE 0.9 % IJ SOLN
3.0000 mL | Freq: Two times a day (BID) | INTRAMUSCULAR | Status: DC
  Administered 2014-08-20 – 2014-08-24 (×8): 3 mL via INTRAVENOUS
  Administered 2014-08-25: 10 mL via INTRAVENOUS
  Administered 2014-08-25: 3 mL via INTRAVENOUS

## 2014-08-20 MED ORDER — HEPARIN SODIUM (PORCINE) 5000 UNIT/ML IJ SOLN
5000.0000 [IU] | Freq: Three times a day (TID) | INTRAMUSCULAR | Status: DC
  Administered 2014-08-20: 5000 [IU] via SUBCUTANEOUS
  Filled 2014-08-20 (×3): qty 1

## 2014-08-20 NOTE — ED Notes (Signed)
EKG given to Dr. Orlie Dakin

## 2014-08-20 NOTE — ED Notes (Signed)
Patient transported to X-ray 

## 2014-08-20 NOTE — H&P (Signed)
[facility] History and Physical  Kyle [name] [id number] DOB: [birth year] DOA: 08/20/2014  Referring physician: ER physician: Dr. Annia Belt PCP: No primary care provider on file. - will set up with [facility]  Chief Complaint: abdominal distention, swelling   HPI:  59 year old with past medical history of chronic systolic and diastolic CHF (baseline EF 15% and grade 2 diastolic dysfunction from 2 D ECHO in 12/2013), hypertension, CKD stage 5, diabetes who presented to [facility] ED with worsening abdominal distention, scrotal and leg swelling over past few weeks prior to this admission. He also reported weight gain over past few months and feeling very tired and short  Of breath with even minimal exertion. No reports of chest pain or palpitations. Patient does endorse poor compliance with medical recommendations and has failed to follow up with renal and cardiology. As noted, his most recent hospitalization was in 12/2013 for acute CHF exacerbation. He was discharge on Lasix,coreg and bidil (pt reported he no longer takes bidil and previously taken lisinopril was also stopped due to CKD). Patient reports no abdominal pain, nausea or vomiting. He does however endorse worsening abdominal distention. No blood in stool or urine. No fevers. No lightheadedness or cough. On admission, vitals signs were stable. His blood work revealed hemoglobin of 10, creatinine of 10.747, mild elevation in troponin level, 0.05. Cardiology has seen the pt in consultation. Renal (Dr. Justin Mend) will see the pt in the morning. Patient will be transferred to [facility] due to likelihood on needing dialysis.    Assessment & Plan    Principal Problem: Acute on chronic combined systolic and diastolic CHF, NYHA class 2 / Anasarca  - Appreciate cardiology consult and recommendations - 2 D ECHO in 12/2013 showed EF of 15% with grade 2 diastolic dysfunction - Pt will likely need HD and plan is for nephrology to see the pt in am - Cardiology  started lasix IV drip  - Will continue aspirin, coreg - Daily weight and strict intake and output - Replete electrolytes as needed  Active Problems:   CKD (chronic kidney disease) stage 5, GFR less than 15 ml/min - Creatinine 10.77 on admission - Will follow up on renal recommendations - Pt will likely require HD    Benign essential HTN - Continue Coreg    DM (diabetes mellitus), type 2, uncontrolled, with renal complications - Check 123456 - Continue amaryl    Troponin level elevated - Likely demand ischemia from acute on chronic renal failure - Cycle cardiac enzymes - The 12 lead EKG showed sinus rhythm - Cardio following; patient will need L&RHC once he starts HD    Anemia of chronic disease - Due to CKD - Hemoglobin 10, stable     Ascites / Scrotal edema - Will place order for paracentesis tomorrow - Supportive care for overall edema - Hope that lasix will help with scrotal edema as well     DVT prophylaxis:  - Heparin subQ ordered   Radiological Exams on Admission: Dg Chest 2 View 08/20/2014   Small-moderate bilateral pleural effusions with bibasilar atelectasis.  Mild pulmonary vascular congestion.   Electronically Signed   By: Margarette Canada M.D.   On: 08/20/2014 15:01    EKG: I have personally reviewed EKG. EKG shows sinus rhythm   Code Status: Full Family Communication: Plan of care discussed with the patient  Disposition Plan: Admit for further evaluation  Leisa Lenz, MD  [facility] Pager [phone]  Time spent in minutes: 75 minutes  Review of Systems:  Constitutional: Negative for fever, chills and malaise/fatigue. Negative for diaphoresis.  HENT: Negative for hearing loss, ear pain, nosebleeds, congestion, sore throat, neck pain, tinnitus and ear discharge.   Eyes: Negative for blurred vision, double vision, photophobia, pain, discharge and redness.  Respiratory: Negative for cough, hemoptysis, sputum production, shortness of breath, wheezing and  stridor.   Cardiovascular: Negative for chest pain, palpitations, orthopnea, claudication and positive leg swelling.  Gastrointestinal: Negative for nausea, vomiting and abdominal pain. Negative for heartburn, constipation, blood in stool and melena.  Genitourinary: Negative for dysuria, urgency, frequency, hematuria and flank pain.  Musculoskeletal: Negative for myalgias, back pain, joint pain and falls.  Skin: Negative for itching and rash.  Neurological: Negative for dizziness and weakness. Negative for tingling, tremors, sensory change, speech change, focal weakness, loss of consciousness and headaches.  Endo/Heme/Allergies: Negative for environmental allergies and polydipsia. Does not bruise/bleed easily.  Psychiatric/Behavioral: Negative for suicidal ideas. The patient is not nervous/anxious.      Past Medical History  Diagnosis Date  . Hypertension   . Diabetes mellitus without complication    Past Surgical History  Procedure Laterality Date  . External ear surgery    . Tonsillectomy     Social History:  reports that he has quit smoking. His smoking use included Cigarettes. He has never used smokeless tobacco. He reports that he does not drink alcohol or use illicit drugs.  No Known Allergies  Family History:  Family History  Problem Relation Age of Onset  . Heart failure Mother      Prior to Admission medications   Medication Sig Start Date End Date Taking? Authorizing Provider  aspirin 81 MG chewable tablet Chew 1 tablet (81 mg total) by mouth daily. 01/17/14  Yes Barton Dubois, MD  calcitRIOL (ROCALTROL) 0.25 MCG capsule Take 1 capsule (0.25 mcg total) by mouth every Monday, Wednesday, and Friday. 01/17/14  Yes Barton Dubois, MD  carvedilol (COREG) 12.5 MG tablet Take 0.5 tablets (6.25 mg total) by mouth 2 (two) times daily with a meal. 01/17/14  Yes Barton Dubois, MD  ferrous sulfate 325 (65 FE) MG tablet Take 1 tablet (325 mg total) by mouth daily with breakfast.  01/18/14  Yes Barton Dubois, MD  furosemide (LASIX) 80 MG tablet Take 1 tablet (80 mg total) by mouth 3 (three) times daily. 01/17/14  Yes Barton Dubois, MD  glimepiride (AMARYL) 2 MG tablet Take 1 tablet (2 mg total) by mouth daily with breakfast. 01/17/14  Yes Barton Dubois, MD  hydrALAZINE (APRESOLINE) 25 MG tablet Take 1.5 tablets (37.5 mg total) by mouth 3 (three) times daily. Patient not taking: Reported on 08/20/2014 01/17/14 01/17/15  Etta Quill, DO  isosorbide dinitrate (ISORDIL) 20 MG tablet Take 1 tablet (20 mg total) by mouth 3 (three) times daily. Patient not taking: Reported on 08/20/2014 01/17/14   Etta Quill, DO  isosorbide-hydrALAZINE (BIDIL) 20-37.5 MG per tablet Take 1 tablet by mouth 3 (three) times daily. Patient not taking: Reported on 08/20/2014 01/17/14   Barton Dubois, MD   Physical Exam: Filed Vitals:   08/20/14 1306 08/20/14 1444  BP: 151/113 153/101  Pulse: 79 74  Temp: 97.8 F (36.6 C) 97.5 F (36.4 C)  TempSrc: Oral Oral  Resp: 18 18  SpO2: 98% 98%    Physical Exam  Constitutional: Appears well-developed and well-nourished. No distress.  HENT: Normocephalic. No tonsillar erythema or exudates Eyes: Conjunctivae are normal. No scleral icterus.  Neck: Normal ROM. Neck supple. (+) JVD. No tracheal deviation. No  thyromegaly.  CVS: RRR, S1/S2 +, no murmurs, no gallops, no carotid bruit.  Pulmonary: Effort and breath sounds normal, no stridor, rhonchi, wheezes, rales.  Abdominal: Soft. BS +,  Distension (+), no tenderness, rebound or guarding.  Musculoskeletal: Normal range of motion. +1 LE pitting edema and no tenderness.  Lymphadenopathy: No lymphadenopathy noted, cervical, inguinal. Neuro: Alert. Normal reflexes, muscle tone coordination. No focal neurologic deficits. Skin: Skin is warm and dry. No rash noted.  No erythema. No pallor.  Psychiatric: Normal mood and affect. Behavior, judgment, thought content normal.   Labs on Admission:  Basic  Metabolic Panel:  Recent Labs Lab 08/20/14 1350  NA 140  K 3.7  CL 105  CO2 21*  GLUCOSE 101*  BUN 96*  CREATININE 10.77*  CALCIUM 6.9*   Liver Function Tests:  Recent Labs Lab 08/20/14 1350  AST 36  ALT 50  ALKPHOS 154*  BILITOT 0.6  PROT 5.8*  ALBUMIN 2.2*   No results for input(s): LIPASE, AMYLASE in the last 168 hours. No results for input(s): AMMONIA in the last 168 hours. CBC:  Recent Labs Lab 08/20/14 1350  WBC 6.1  NEUTROABS 4.5  HGB 10.0*  HCT 30.2*  MCV 80.7  PLT 291   Cardiac Enzymes:  Recent Labs Lab 08/20/14 1350  TROPONINI 0.05*   BNP: Invalid input(s): POCBNP CBG: No results for input(s): GLUCAP in the last 168 hours.  If 7PM-7AM, please contact night-coverage [email or web address] Password [facility] 08/20/2014, 4:30 PM

## 2014-08-20 NOTE — ED Notes (Signed)
Patient was sent from PCP today with c/o scrotal swelling, abdominal bloating, and hypertension.

## 2014-08-20 NOTE — ED Provider Notes (Addendum)
CSN: [id number]     Arrival date & time 08/20/14  1301 History   First MD Initiated Contact with Patient 08/20/14 1332     Chief Complaint  Patient presents with  . Bloated  . Hypertension      HPI Patient presents to the emergency department complaining of diffuse overall swelling.  He states his baseline weight is 180 pounds reports he weighs 240 pounds now at this time.  December 2015 he was diagnosed with congestive heart failure with EF of 15% as well as acute on chronic renal failure.  He has never followed up with nephrology or with cardiology since then.  He's been seeing his primary care physician who is written and Lasix and has been trying to encourage him to see the subspecialists.  Patient finally agreed to come the emergency department today because his abdominal girth is continued to increase in size and is swelling and weight of worsened.  He states he never followed up with the nephrologist because he was afraid about starting dialysis.   Past Medical History  Diagnosis Date  . Hypertension   . Diabetes mellitus without complication    Past Surgical History  Procedure Laterality Date  . External ear surgery    . Tonsillectomy     Family History  Problem Relation Age of Onset  . Heart failure Mother    History  Substance Use Topics  . Smoking status: Former Smoker    Types: Cigarettes  . Smokeless tobacco: Never Used  . Alcohol Use: No    Review of Systems  All other systems reviewed and are negative.     Allergies  Review of patient's allergies indicates no known allergies.  Home Medications   Prior to Admission medications   Medication Sig Start Date End Date Taking? Authorizing Provider  aspirin 81 MG chewable tablet Chew 1 tablet (81 mg total) by mouth daily. 01/17/14  Yes Barton Dubois, MD  calcitRIOL (ROCALTROL) 0.25 MCG capsule Take 1 capsule (0.25 mcg total) by mouth every Monday, Wednesday, and Friday. 01/17/14  Yes Barton Dubois, MD   carvedilol (COREG) 12.5 MG tablet Take 0.5 tablets (6.25 mg total) by mouth 2 (two) times daily with a meal. 01/17/14  Yes Barton Dubois, MD  ferrous sulfate 325 (65 FE) MG tablet Take 1 tablet (325 mg total) by mouth daily with breakfast. 01/18/14  Yes Barton Dubois, MD  furosemide (LASIX) 80 MG tablet Take 1 tablet (80 mg total) by mouth 3 (three) times daily. 01/17/14  Yes Barton Dubois, MD  glimepiride (AMARYL) 2 MG tablet Take 1 tablet (2 mg total) by mouth daily with breakfast. 01/17/14  Yes Barton Dubois, MD  hydrALAZINE (APRESOLINE) 25 MG tablet Take 1.5 tablets (37.5 mg total) by mouth 3 (three) times daily. Patient not taking: Reported on 08/20/2014 01/17/14 01/17/15  Etta Quill, DO  isosorbide dinitrate (ISORDIL) 20 MG tablet Take 1 tablet (20 mg total) by mouth 3 (three) times daily. Patient not taking: Reported on 08/20/2014 01/17/14   Etta Quill, DO  isosorbide-hydrALAZINE (BIDIL) 20-37.5 MG per tablet Take 1 tablet by mouth 3 (three) times daily. Patient not taking: Reported on 08/20/2014 01/17/14   Barton Dubois, MD   BP 153/101 mmHg  Pulse 74  Temp(Src) 97.5 F (36.4 C) (Oral)  Resp 18  SpO2 98% Physical Exam  Constitutional: He is oriented to person, place, and time. He appears well-developed and well-nourished.  HENT:  Head: Normocephalic and atraumatic.  Eyes: EOM are normal.  Neck:  Normal range of motion.  Cardiovascular: Normal rate, regular rhythm, normal heart sounds and intact distal pulses.   Pulmonary/Chest: Effort normal and breath sounds normal. No respiratory distress.  Abdominal: Soft. He exhibits no distension.  Diffuse abdominal distention with likely ascites and lower abdominal wall edema  Musculoskeletal: Normal range of motion.  Neurological: He is alert and oriented to person, place, and time.  Skin: Skin is warm and dry.  Psychiatric: He has a normal mood and affect. Judgment normal.  Nursing note and vitals reviewed.   ED Course   Procedures (including critical care time) Labs Review Labs Reviewed  CBC WITH DIFFERENTIAL/PLATELET - Abnormal; Notable for the following:    RBC 3.74 (*)    Hemoglobin 10.0 (*)    HCT 30.2 (*)    RDW 18.6 (*)    All other components within normal limits  COMPREHENSIVE METABOLIC PANEL - Abnormal; Notable for the following:    CO2 21 (*)    Glucose, Bld 101 (*)    BUN 96 (*)    Creatinine, Ser 10.77 (*)    Calcium 6.9 (*)    Total Protein 5.8 (*)    Albumin 2.2 (*)    Alkaline Phosphatase 154 (*)    GFR calc non Af Amer 5 (*)    GFR calc Af Amer 5 (*)    All other components within normal limits  TROPONIN I - Abnormal; Notable for the following:    Troponin I 0.05 (*)    All other components within normal limits  BRAIN NATRIURETIC PEPTIDE - Abnormal; Notable for the following:    B Natriuretic Peptide >4500.0 (*)    All other components within normal limits    Imaging Review Dg Chest 2 View  08/20/2014   CLINICAL DATA:  Lower extremity edema, CHF and anasarca. History of renal failure.  EXAM: CHEST  2 VIEW  COMPARISON:  01/14/2014 and 06/20/2008 chest radiographs  FINDINGS: Small-moderate bilateral pleural effusions and bibasilar atelectasis again noted.  Mild pulmonary vascular congestion is present.  The visualized cardiomediastinal silhouette is unchanged.  There is no evidence of pneumothorax.  No acute bony abnormalities are identified.  IMPRESSION: Small-moderate bilateral pleural effusions with bibasilar atelectasis.  Mild pulmonary vascular congestion.   Electronically Signed   By: Margarette Canada M.D.   On: 08/20/2014 15:01  I personally reviewed the imaging tests through PACS system I reviewed available ER/hospitalization records through the EMR    EKG Interpretation   Date/Time:  Tuesday August 20 2014 15:01:04 EDT Ventricular Rate:  77 PR Interval:  140 QRS Duration: 87 QT Interval:  429 QTC Calculation: 485 R Axis:   -32 Text Interpretation:  Sinus rhythm  Probable left atrial enlargement Left  axis deviation Nonspecific T abnormalities, lateral leads Borderline  prolonged QT interval Baseline wander in lead(s) V2 No significant change  since last tracing Confirmed by Winfred Leeds  MD, SAM [phone]) on 08/20/2014  3:37:31 PM      MDM   Final diagnoses:  Acute on chronic renal failure  Anasarca    Patient need IV diuresis and salt restriction.  He likely is going to need dialysis sooner than later.  He'll need inpatient nephrology and cardiology consultation.  IV Lasix now  Nephrology: Dr Justin Mend or partner will see in AM Cardiology will evaluate the pt at [facility], MD 08/20/14 [place], MD 08/20/14 [phone]

## 2014-08-20 NOTE — Consult Note (Signed)
CARDIOLOGY CONSULT NOTE   Patient ID: Kyle [name] MRN: [id number], DOB/AGE: September 22, 1955   Admit date: 08/20/2014 Date of Consult: 08/20/2014   Primary Physician: No primary care provider on file. Primary Cardiologist: Dr. Johnsie Cancel Primary Nephrologist: Dr. Justin Mend  Pt. Profile  59 year old AA male with past medical history of HTN, DM, stage V CKD and chronic systolic HF with baseline EF 15% present with worsening renal function and evidence of biventricular failure, ascites  Problem List  Past Medical History  Diagnosis Date  . Hypertension   . Diabetes mellitus without complication     Past Surgical History  Procedure Laterality Date  . External ear surgery    . Tonsillectomy       Allergies  No Known Allergies  HPI   The patient is a 59 year old AA male with PMH of HTN, DM, stage V CKD and chronic systolic HF with baseline EF 15%. He was originally admitted to [facility] in December 2015, when he presented with acute on chronic renal failure and acute heart failure. Echocardiogram was obtained which showed EF was 15% with grade 2 diastolic dysfunction, mild MR/AR, moderate TR, small pericardial effusion. Prior to that, patient did not have medical follow-up for 2 years and was not taking any of his medication for up to a year. Both cardiology and nephrology team was consulted during that admission. He was seen by Dr. Johnsie Cancel, despite having Cr > 5, he was responding to IV Lasix at the time. Per Dr. Johnsie Cancel, echo showed echogenic myocardiogram that could represent amyloid with small pericardial effusion and EF 15% with elevated EDP on diastolic evaluation. There was a questionable mass in the pericardial space near the RV and AV groove likely represent epicardial fat. We were unable to get cardiac MRI with gadolinium which was the only noninvasive cardiac test for amyloid due to renal function. It was felt the patient likely has nonischemic cardiomyopathy, however will  require L&R cardiac catheterization to definitively assess coronary artery anatomy and measure cardiac output. This has to be done after he start on dialysis. He was eventually discharged on 01/17/2014 after transition to 80 mg TID of lasix. Heart failure medication included Corag and BiDil, his previous amlodipine and lisinopril has been discontinued. It appears patient was hesitant to start on hemodialysis and failed to follow-up with nephrology service after discharge. He did not follow up with cardiology service either. Fortunately, he has been followed by his PCP who continued to encourage him to follow up with specialists.   He states he has been compliant with most of his medication, however with 2 exceptions. First, he was taking 80 mg TID lasix all at once in the morning (making it 240mg  lasix daily), which he asked me during interview if this is bad. Second, when he listed all his medication, it appears he is no longer taking BiDil for unknown reasons. For the last several month, he has noticed increasing weight gain. He denies any significant lower extremity edema, however states he sleep in the chair sitting up for a long time now. He also endorsed increased abdominal girth and scrotal edema for the past several month.   [facility] due to increasing abdominal girth. He was found to have creatinine of 10.77, which is markedly elevated when compared to the previous creatinine of on discharge of 5.63 on 01/16/2014. He is BNP was greater than 4500. Troponin borderline elevated at 0.05, although  he denies ever having any chest pain. Hemoglobin was 10.0. Chest x-ray showed small to moderate bilateral pleural effusion with bibasilar atelectasis, mild pulmonary vascular congestion. EKG showed a normal sinus rhythm with T-wave inversion in the lateral leads. Cardiology has been consulted for heart failure. Patient was transferred to [facility] for nephrology and cardiology evaluation and likely start on hemodialysis.  Inpatient Medications  . aspirin  81 mg Oral Daily  . [START ON 08/21/2014] calcitRIOL  0.25 mcg Oral Q M,W,F  . carvedilol  6.25 mg Oral BID WC  . [START ON 08/21/2014] ferrous sulfate  325 mg Oral Q breakfast  . [START ON 08/21/2014] glimepiride  2 mg Oral Q breakfast  . heparin  5,000 Units Subcutaneous 3 times per day  . sodium chloride  3 mL Intravenous Q12H    Family History Family History  Problem Relation Age of Onset  . Heart failure Mother      Social History History   Social History  . Marital Status: Married    Spouse Name: N/A  . Number of Children: N/A  . Years of Education: N/A   Occupational History  . Not on file.   Social History Main Topics  . Smoking status: Former Smoker    Types: Cigarettes  . Smokeless tobacco: Never Used  . Alcohol Use: No  . Drug Use: No  . Sexual Activity: Not on file   Other Topics Concern  . Not on file   Social History Narrative     Review of Systems  General:  No chills, fever, night sweats +weight gain.  Cardiovascular:  No palpitations. Never had any CP. +orthopnea, dyspnea on exertion Dermatological: No rash, lesions/masses Respiratory: No cough, dyspnea Urologic: No hematuria, dysuria Abdominal:   No nausea, vomiting, diarrhea, bright red blood per rectum, melena, or hematemesis +abdominal bloating.  Neurologic:  No visual changes, wkns, changes in mental status. All other systems reviewed and are otherwise negative except as noted above.  Physical Exam  Blood pressure 153/111, pulse 77, temperature 98.2 F (36.8 C), temperature source Oral, resp. rate 20, SpO2 100 %.  General: Pleasant, NAD Psych: Normal affect. Neuro: Alert and oriented X 3. Moves all extremities spontaneously. HEENT: Normal  Neck: Supple without bruits  +JVD. Lungs:  Resp regular and unlabored. No obvious rale, markedly decreased breath sound in  bilateral base, more so on the R than L Heart: RRR no s3, s4, or murmurs. Abdomen: Soft, non-tender, BS + x 4. Markedly distended. Scrotal edema noted.  Extremities: No clubbing, cyanosis. DP/PT/Radials 2+ and equal bilaterally. 1+ LE edema with woody texture.  Labs   Recent Labs  08/20/14 1350  TROPONINI 0.05*   Lab Results  Component Value Date   WBC 6.1 08/20/2014   HGB 10.0* 08/20/2014   HCT 30.2* 08/20/2014   MCV 80.7 08/20/2014   PLT 291 08/20/2014    Recent Labs Lab 08/20/14 1350  NA 140  K 3.7  CL 105  CO2 21*  BUN 96*  CREATININE 10.77*  CALCIUM 6.9*  PROT 5.8*  BILITOT 0.6  ALKPHOS 154*  ALT 50  AST 36  GLUCOSE 101*   Radiology/Studies  Dg Chest 2 View  08/20/2014   CLINICAL DATA:  Lower extremity edema, CHF and anasarca. History of renal failure.  EXAM: CHEST  2 VIEW  COMPARISON:  01/14/2014 and 06/20/2008 chest radiographs  FINDINGS: Small-moderate bilateral pleural effusions and bibasilar atelectasis again noted.  Mild pulmonary vascular congestion is present.  The visualized  cardiomediastinal silhouette is unchanged.  There is no evidence of pneumothorax.  No acute bony abnormalities are identified.  IMPRESSION: Small-moderate bilateral pleural effusions with bibasilar atelectasis.  Mild pulmonary vascular congestion.   Electronically Signed   By: Margarette Canada M.D.   On: 08/20/2014 15:01    ECG  NSR with TWI in lateral leads  ASSESSMENT AND PLAN  1. 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 Compliant with coreg, but not with BiDil  - will need L&RHC once start HD. Also need cardiac MRI with gadolinium to r/o amyloid after cath if NICM, but this should be done after start HD as both study is going to permanently shut down his kidney  - will need IV  lasix, likely lasix gtt  2. Ascites: likely need abdominal U/S and paracentesis  3. Acute on chronic stage V CKD: apparently instead of taking 80mg  TID PO lasix, he has been taken 240mg  daily (all at once) which may have also contributed to his acute renal insufficiency  - ultimately need HD in order with move forward with treatment, pending nephrology consult  4. HTN 5. DM 6. Scrotal edema related to #1   Signed, Almyra Deforest, PA-C 08/20/2014, 7:58 PM

## 2014-08-21 ENCOUNTER — Encounter (HOSPITAL_COMMUNITY): Payer: Self-pay | Admitting: Anesthesiology

## 2014-08-21 ENCOUNTER — Inpatient Hospital Stay (HOSPITAL_COMMUNITY): Payer: 59

## 2014-08-21 ENCOUNTER — Inpatient Hospital Stay (HOSPITAL_COMMUNITY): Payer: 59 | Admitting: Anesthesiology

## 2014-08-21 ENCOUNTER — Encounter (HOSPITAL_COMMUNITY)
Admission: EM | Disposition: A | Discharge status: Home Health | Payer: Self-pay | Source: Home / Self Care | Attending: Pulmonary Disease

## 2014-08-21 DIAGNOSIS — I469 Cardiac arrest, cause unspecified: Secondary | ICD-10-CM | POA: Diagnosis present

## 2014-08-21 DIAGNOSIS — I5043 Acute on chronic combined systolic (congestive) and diastolic (congestive) heart failure: Secondary | ICD-10-CM

## 2014-08-21 DIAGNOSIS — N19 Unspecified kidney failure: Secondary | ICD-10-CM

## 2014-08-21 DIAGNOSIS — N185 Chronic kidney disease, stage 5: Secondary | ICD-10-CM

## 2014-08-21 DIAGNOSIS — R188 Other ascites: Secondary | ICD-10-CM | POA: Diagnosis present

## 2014-08-21 HISTORY — PX: INSERTION OF DIALYSIS CATHETER: SHX1324

## 2014-08-21 LAB — COMPREHENSIVE METABOLIC PANEL
ALT: 38 U/L (ref 17–63)
ALT: 40 U/L (ref 17–63)
ANION GAP: 12 (ref 5–15)
ANION GAP: 15 (ref 5–15)
AST: 24 U/L (ref 15–41)
AST: 31 U/L (ref 15–41)
Albumin: 1.9 g/dL — ABNORMAL LOW (ref 3.5–5.0)
Albumin: 1.8 g/dL — ABNORMAL LOW (ref 3.5–5.0)
Alkaline Phosphatase: 130 U/L — ABNORMAL HIGH (ref 38–126)
Alkaline Phosphatase: 116 U/L (ref 38–126)
BILIRUBIN TOTAL: 0.6 mg/dL (ref 0.3–1.2)
BUN: 95 mg/dL — ABNORMAL HIGH (ref 6–20)
BUN: 98 mg/dL — AB (ref 6–20)
CHLORIDE: 104 mmol/L (ref 101–111)
CO2: 22 mmol/L (ref 22–32)
CO2: 19 mmol/L — ABNORMAL LOW (ref 22–32)
CREATININE: 10.89 mg/dL — AB (ref 0.61–1.24)
Calcium: 6.7 mg/dL — ABNORMAL LOW (ref 8.9–10.3)
Calcium: 6.5 mg/dL — ABNORMAL LOW (ref 8.9–10.3)
Chloride: 104 mmol/L (ref 101–111)
Creatinine, Ser: 10.89 mg/dL — ABNORMAL HIGH (ref 0.61–1.24)
GFR calc Af Amer: 5 mL/min — ABNORMAL LOW (ref >60)
GFR calc non Af Amer: 4 mL/min — ABNORMAL LOW (ref >60)
GFR, EST AFRICAN AMERICAN: 5 mL/min — AB (ref >60)
GFR, EST NON AFRICAN AMERICAN: 4 mL/min — AB (ref >60)
Glucose, Bld: 143 mg/dL — ABNORMAL HIGH (ref 65–99)
Glucose, Bld: 150 mg/dL — ABNORMAL HIGH (ref 65–99)
POTASSIUM: 3.3 mmol/L — AB (ref 3.5–5.1)
Potassium: 3.3 mmol/L — ABNORMAL LOW (ref 3.5–5.1)
Sodium: 138 mmol/L (ref 135–145)
Sodium: 138 mmol/L (ref 135–145)
TOTAL PROTEIN: 5.5 g/dL — AB (ref 6.5–8.1)
TOTAL PROTEIN: 5.1 g/dL — AB (ref 6.5–8.1)
Total Bilirubin: 0.5 mg/dL (ref 0.3–1.2)

## 2014-08-21 LAB — CBC
HCT: 30.1 % — ABNORMAL LOW (ref 39.0–52.0)
Hemoglobin: 9.9 g/dL — ABNORMAL LOW (ref 13.0–17.0)
MCH: 26.5 pg (ref 26.0–34.0)
MCHC: 32.9 g/dL (ref 30.0–36.0)
MCV: 80.7 fL (ref 78.0–100.0)
Platelets: 287 10*3/uL (ref 150–400)
RBC: 3.73 MIL/uL — ABNORMAL LOW (ref 4.22–5.81)
RDW: 18.7 % — ABNORMAL HIGH (ref 11.5–15.5)
WBC: 6.7 10*3/uL (ref 4.0–10.5)

## 2014-08-21 LAB — BASIC METABOLIC PANEL
ANION GAP: 15 (ref 5–15)
ANION GAP: 16 — AB (ref 5–15)
ANION GAP: 16 — AB (ref 5–15)
BUN: 95 mg/dL — ABNORMAL HIGH (ref 6–20)
BUN: 94 mg/dL — AB (ref 6–20)
BUN: 98 mg/dL — ABNORMAL HIGH (ref 6–20)
CALCIUM: 6.3 mg/dL — AB (ref 8.9–10.3)
CALCIUM: 6.5 mg/dL — AB (ref 8.9–10.3)
CALCIUM: 6.5 mg/dL — AB (ref 8.9–10.3)
CHLORIDE: 106 mmol/L (ref 101–111)
CO2: 18 mmol/L — ABNORMAL LOW (ref 22–32)
CO2: 17 mmol/L — ABNORMAL LOW (ref 22–32)
CO2: 16 mmol/L — AB (ref 22–32)
CREATININE: 10.16 mg/dL — AB (ref 0.61–1.24)
Chloride: 106 mmol/L (ref 101–111)
Chloride: 106 mmol/L (ref 101–111)
Creatinine, Ser: 10.29 mg/dL — ABNORMAL HIGH (ref 0.61–1.24)
Creatinine, Ser: 10.37 mg/dL — ABNORMAL HIGH (ref 0.61–1.24)
GFR calc Af Amer: 6 mL/min — ABNORMAL LOW (ref >60)
GFR calc Af Amer: 6 mL/min — ABNORMAL LOW (ref >60)
GFR calc Af Amer: 6 mL/min — ABNORMAL LOW (ref >60)
GFR calc non Af Amer: 5 mL/min — ABNORMAL LOW (ref >60)
GFR calc non Af Amer: 5 mL/min — ABNORMAL LOW (ref >60)
GFR, EST NON AFRICAN AMERICAN: 5 mL/min — AB (ref >60)
Glucose, Bld: 127 mg/dL — ABNORMAL HIGH (ref 65–99)
Glucose, Bld: 120 mg/dL — ABNORMAL HIGH (ref 65–99)
Glucose, Bld: 132 mg/dL — ABNORMAL HIGH (ref 65–99)
POTASSIUM: 3.5 mmol/L (ref 3.5–5.1)
POTASSIUM: 3.2 mmol/L — AB (ref 3.5–5.1)
Potassium: 3.3 mmol/L — ABNORMAL LOW (ref 3.5–5.1)
SODIUM: 138 mmol/L (ref 135–145)
Sodium: 139 mmol/L (ref 135–145)
Sodium: 139 mmol/L (ref 135–145)

## 2014-08-21 LAB — HEMOGLOBIN A1C
HEMOGLOBIN A1C: 7.6 % — AB (ref 4.8–5.6)
MEAN PLASMA GLUCOSE: 171 mg/dL

## 2014-08-21 LAB — BODY FLUID CELL COUNT WITH DIFFERENTIAL
Eos, Fluid: 0 %
LYMPHS FL: 21 %
Monocyte-Macrophage-Serous Fluid: 79 % (ref 50–90)
NEUTROPHIL FLUID: 0 % (ref 0–25)
Total Nucleated Cell Count, Fluid: 153 cu mm (ref 0–1000)

## 2014-08-21 LAB — POCT I-STAT 3, ART BLOOD GAS (G3+)
Acid-base deficit: 4 mmol/L — ABNORMAL HIGH (ref 0.0–2.0)
BICARBONATE: 20 meq/L (ref 20.0–24.0)
O2 Saturation: 100 %
TCO2: 21 mmol/L (ref 0–100)
pCO2 arterial: 31.2 mmHg — ABNORMAL LOW (ref 35.0–45.0)
pH, Arterial: 7.414 (ref 7.350–7.450)
pO2, Arterial: 219 mmHg — ABNORMAL HIGH (ref 80.0–100.0)

## 2014-08-21 LAB — CBC WITH DIFFERENTIAL/PLATELET
BASOS ABS: 0 10*3/uL (ref 0.0–0.1)
Basophils Relative: 0 % (ref 0–1)
Eosinophils Absolute: 0.1 10*3/uL (ref 0.0–0.7)
Eosinophils Relative: 1 % (ref 0–5)
HCT: 28.3 % — ABNORMAL LOW (ref 39.0–52.0)
Hemoglobin: 9.5 g/dL — ABNORMAL LOW (ref 13.0–17.0)
Lymphocytes Relative: 6 % — ABNORMAL LOW (ref 12–46)
Lymphs Abs: 0.4 10*3/uL — ABNORMAL LOW (ref 0.7–4.0)
MCH: 26.8 pg (ref 26.0–34.0)
MCHC: 33.6 g/dL (ref 30.0–36.0)
MCV: 79.9 fL (ref 78.0–100.0)
MONO ABS: 0.4 10*3/uL (ref 0.1–1.0)
Monocytes Relative: 5 % (ref 3–12)
NEUTROS ABS: 6.4 10*3/uL (ref 1.7–7.7)
Neutrophils Relative %: 88 % — ABNORMAL HIGH (ref 43–77)
Platelets: 276 10*3/uL (ref 150–400)
RBC: 3.54 MIL/uL — ABNORMAL LOW (ref 4.22–5.81)
RDW: 18.4 % — ABNORMAL HIGH (ref 11.5–15.5)
WBC: 7.2 10*3/uL (ref 4.0–10.5)

## 2014-08-21 LAB — TROPONIN I
TROPONIN I: 0.05 ng/mL — AB (ref <0.031)
TROPONIN I: 0.06 ng/mL — AB (ref <0.031)
Troponin I: 0.04 ng/mL — ABNORMAL HIGH (ref <0.031)
Troponin I: 0.17 ng/mL — ABNORMAL HIGH (ref <0.031)

## 2014-08-21 LAB — PROTIME-INR
INR: 1.3 (ref 0.00–1.49)
PROTHROMBIN TIME: 16.3 s — AB (ref 11.6–15.2)

## 2014-08-21 LAB — MAGNESIUM: MAGNESIUM: 1.6 mg/dL — AB (ref 1.7–2.4)

## 2014-08-21 LAB — ALBUMIN, FLUID (OTHER): Albumin, Fluid: 1.3 g/dL

## 2014-08-21 LAB — GLUCOSE, CAPILLARY
Glucose-Capillary: 141 mg/dL — ABNORMAL HIGH (ref 65–99)
Glucose-Capillary: 97 mg/dL (ref 65–99)

## 2014-08-21 LAB — MRSA PCR SCREENING: MRSA by PCR: NEGATIVE

## 2014-08-21 LAB — LACTATE DEHYDROGENASE, PLEURAL OR PERITONEAL FLUID: LD, Fluid: 136 U/L — ABNORMAL HIGH (ref 3–23)

## 2014-08-21 LAB — PHOSPHORUS: Phosphorus: 5.3 mg/dL — ABNORMAL HIGH (ref 2.5–4.6)

## 2014-08-21 LAB — LACTIC ACID, PLASMA
Lactic Acid, Venous: 1.7 mmol/L (ref 0.5–2.0)
Lactic Acid, Venous: 1 mmol/L (ref 0.5–2.0)

## 2014-08-21 LAB — APTT: aPTT: 34 seconds (ref 24–37)

## 2014-08-21 LAB — SURGICAL PCR SCREEN
MRSA, PCR: NEGATIVE
Staphylococcus aureus: POSITIVE — AB

## 2014-08-21 SURGERY — INSERTION OF DIALYSIS CATHETER
Anesthesia: Monitor Anesthesia Care | Site: Neck

## 2014-08-21 MED ORDER — MIDAZOLAM HCL 5 MG/5ML IJ SOLN
INTRAMUSCULAR | Status: DC | PRN
  Administered 2014-08-21 (×2): 1 mg via INTRAVENOUS

## 2014-08-21 MED ORDER — POTASSIUM CHLORIDE 10 MEQ/100ML IV SOLN
10.0000 meq | INTRAVENOUS | Status: AC
  Administered 2014-08-21: 10 meq via INTRAVENOUS
  Filled 2014-08-21: qty 100

## 2014-08-21 MED ORDER — FENTANYL BOLUS VIA INFUSION
50.0000 ug | INTRAVENOUS | Status: DC | PRN
  Filled 2014-08-21: qty 50

## 2014-08-21 MED ORDER — SODIUM CHLORIDE 0.9 % IV SOLN
1.0000 g | Freq: Once | INTRAVENOUS | Status: AC
  Administered 2014-08-21: 1 g via INTRAVENOUS
  Filled 2014-08-21: qty 10

## 2014-08-21 MED ORDER — CISATRACURIUM BOLUS VIA INFUSION
0.0500 mg/kg | INTRAVENOUS | Status: DC | PRN
  Filled 2014-08-21: qty 6

## 2014-08-21 MED ORDER — PANTOPRAZOLE SODIUM 40 MG IV SOLR
40.0000 mg | Freq: Every day | INTRAVENOUS | Status: DC
  Administered 2014-08-21 – 2014-08-22 (×2): 40 mg via INTRAVENOUS
  Filled 2014-08-21 (×3): qty 40

## 2014-08-21 MED ORDER — CISATRACURIUM BOLUS VIA INFUSION
0.1000 mg/kg | Freq: Once | INTRAVENOUS | Status: AC
  Administered 2014-08-21: 10.6 mg via INTRAVENOUS
  Filled 2014-08-21: qty 11

## 2014-08-21 MED ORDER — SODIUM CHLORIDE 0.9 % IR SOLN
Status: DC | PRN
  Administered 2014-08-21: 500 mL

## 2014-08-21 MED ORDER — LIDOCAINE HCL (PF) 1 % IJ SOLN
INTRAMUSCULAR | Status: AC
  Filled 2014-08-21: qty 10

## 2014-08-21 MED ORDER — SUCCINYLCHOLINE CHLORIDE 20 MG/ML IJ SOLN
INTRAMUSCULAR | Status: AC
  Filled 2014-08-21: qty 1

## 2014-08-21 MED ORDER — INSULIN ASPART 100 UNIT/ML ~~LOC~~ SOLN
0.0000 [IU] | SUBCUTANEOUS | Status: DC
  Administered 2014-08-22 – 2014-08-26 (×8): 3 [IU] via SUBCUTANEOUS
  Administered 2014-08-27: 7 [IU] via SUBCUTANEOUS
  Administered 2014-08-27 – 2014-08-28 (×3): 3 [IU] via SUBCUTANEOUS
  Administered 2014-08-28 (×2): 4 [IU] via SUBCUTANEOUS

## 2014-08-21 MED ORDER — ROCURONIUM BROMIDE 100 MG/10ML IV SOLN
INTRAVENOUS | Status: DC | PRN
  Administered 2014-08-21: 25 mg via INTRAVENOUS

## 2014-08-21 MED ORDER — POTASSIUM CHLORIDE 10 MEQ/50ML IV SOLN
10.0000 meq | INTRAVENOUS | Status: AC
  Administered 2014-08-21 – 2014-08-22 (×2): 10 meq via INTRAVENOUS
  Filled 2014-08-21 (×2): qty 50

## 2014-08-21 MED ORDER — CHLORHEXIDINE GLUCONATE 0.12 % MT SOLN
15.0000 mL | Freq: Two times a day (BID) | OROMUCOSAL | Status: DC
  Administered 2014-08-21 – 2014-08-28 (×14): 15 mL via OROMUCOSAL
  Filled 2014-08-21 (×10): qty 15

## 2014-08-21 MED ORDER — HYDRALAZINE HCL 10 MG PO TABS
10.0000 mg | ORAL_TABLET | Freq: Three times a day (TID) | ORAL | Status: DC
  Filled 2014-08-21 (×2): qty 1

## 2014-08-21 MED ORDER — EPINEPHRINE HCL 0.1 MG/ML IJ SOSY
PREFILLED_SYRINGE | INTRAMUSCULAR | Status: AC
  Filled 2014-08-21: qty 20

## 2014-08-21 MED ORDER — LIDOCAINE HCL (CARDIAC) 20 MG/ML IV SOLN
INTRAVENOUS | Status: DC | PRN
  Administered 2014-08-21: 25 mg via INTRAVENOUS

## 2014-08-21 MED ORDER — ASPIRIN 300 MG RE SUPP
300.0000 mg | RECTAL | Status: AC
  Administered 2014-08-21: 300 mg via RECTAL
  Filled 2014-08-21 (×2): qty 1

## 2014-08-21 MED ORDER — LIDOCAINE HCL (CARDIAC) 20 MG/ML IV SOLN
INTRAVENOUS | Status: AC
  Filled 2014-08-21: qty 5

## 2014-08-21 MED ORDER — NOREPINEPHRINE BITARTRATE 1 MG/ML IV SOLN
0.0000 ug/min | INTRAVENOUS | Status: DC
  Administered 2014-08-21: 5 ug/min via INTRAVENOUS
  Administered 2014-08-22: 1.25 ug/min via INTRAVENOUS
  Filled 2014-08-21 (×2): qty 4

## 2014-08-21 MED ORDER — HEPARIN SODIUM (PORCINE) 1000 UNIT/ML IJ SOLN
INTRAMUSCULAR | Status: AC
  Filled 2014-08-21: qty 1

## 2014-08-21 MED ORDER — MAGNESIUM SULFATE IN D5W 10-5 MG/ML-% IV SOLN
1.0000 g | Freq: Once | INTRAVENOUS | Status: AC
  Administered 2014-08-21: 1 g via INTRAVENOUS
  Filled 2014-08-21: qty 100

## 2014-08-21 MED ORDER — EPINEPHRINE HCL 1 MG/ML IJ SOLN
INTRAMUSCULAR | Status: DC | PRN
  Administered 2014-08-21 (×5): .3 mg via INTRAVENOUS

## 2014-08-21 MED ORDER — SODIUM CHLORIDE 0.9 % IV SOLN
INTRAVENOUS | Status: DC
  Administered 2014-08-23: 10 mL/h via INTRAVENOUS

## 2014-08-21 MED ORDER — FENTANYL CITRATE (PF) 100 MCG/2ML IJ SOLN
INTRAMUSCULAR | Status: DC | PRN
  Administered 2014-08-21 (×3): 50 ug via INTRAVENOUS

## 2014-08-21 MED ORDER — METOPROLOL TARTRATE 1 MG/ML IV SOLN
5.0000 mg | Freq: Four times a day (QID) | INTRAVENOUS | Status: DC
  Administered 2014-08-23: 5 mg via INTRAVENOUS
  Filled 2014-08-21 (×9): qty 5

## 2014-08-21 MED ORDER — HEPARIN SODIUM (PORCINE) 5000 UNIT/ML IJ SOLN
5000.0000 [IU] | Freq: Three times a day (TID) | INTRAMUSCULAR | Status: DC
  Administered 2014-08-21 – 2014-09-04 (×39): 5000 [IU] via SUBCUTANEOUS
  Filled 2014-08-21 (×41): qty 1

## 2014-08-21 MED ORDER — PHENYLEPHRINE HCL 10 MG/ML IJ SOLN
INTRAMUSCULAR | Status: AC
  Filled 2014-08-21: qty 1

## 2014-08-21 MED ORDER — LIDOCAINE-EPINEPHRINE (PF) 1 %-1:200000 IJ SOLN
INTRAMUSCULAR | Status: DC | PRN
  Administered 2014-08-21: 8 mL

## 2014-08-21 MED ORDER — SODIUM CHLORIDE 0.9 % IV SOLN
2000.0000 mL | Freq: Once | INTRAVENOUS | Status: AC
  Administered 2014-08-21: 2000 mL via INTRAVENOUS

## 2014-08-21 MED ORDER — CETYLPYRIDINIUM CHLORIDE 0.05 % MT LIQD
7.0000 mL | Freq: Four times a day (QID) | OROMUCOSAL | Status: DC
  Administered 2014-08-22 – 2014-08-26 (×19): 7 mL via OROMUCOSAL

## 2014-08-21 MED ORDER — FENTANYL CITRATE (PF) 100 MCG/2ML IJ SOLN: 25.0000 ug | INTRAMUSCULAR | Status: DC | PRN

## 2014-08-21 MED ORDER — FENTANYL CITRATE (PF) 100 MCG/2ML IJ SOLN: 100.0000 ug | Freq: Once | INTRAMUSCULAR | Status: DC

## 2014-08-21 MED ORDER — SODIUM CHLORIDE 0.9 % IV SOLN
1.0000 ug/kg/min | INTRAVENOUS | Status: DC
  Administered 2014-08-21 – 2014-08-22 (×2): 1 ug/kg/min via INTRAVENOUS
  Filled 2014-08-21: qty 20

## 2014-08-21 MED ORDER — CEFAZOLIN SODIUM 1-5 GM-% IV SOLN
1.0000 g | INTRAVENOUS | Status: AC
  Administered 2014-08-21: 1 g via INTRAVENOUS
  Filled 2014-08-21 (×2): qty 50

## 2014-08-21 MED ORDER — CHLORHEXIDINE GLUCONATE 0.12 % MT SOLN
OROMUCOSAL | Status: AC
  Filled 2014-08-21: qty 15

## 2014-08-21 MED ORDER — LIDOCAINE-EPINEPHRINE (PF) 1 %-1:200000 IJ SOLN
INTRAMUSCULAR | Status: AC
  Filled 2014-08-21: qty 10

## 2014-08-21 MED ORDER — 0.9 % SODIUM CHLORIDE (POUR BTL) OPTIME
TOPICAL | Status: DC | PRN
  Administered 2014-08-21: 1000 mL

## 2014-08-21 MED ORDER — ATROPINE SULFATE 0.1 MG/ML IJ SOLN
INTRAMUSCULAR | Status: AC
  Filled 2014-08-21: qty 10

## 2014-08-21 MED ORDER — ATROPINE SULFATE 1 MG/ML IJ SOLN
INTRAMUSCULAR | Status: DC | PRN
  Administered 2014-08-21 (×2): .5 mg via INTRAVENOUS

## 2014-08-21 MED ORDER — PROPOFOL 1000 MG/100ML IV EMUL
5.0000 ug/kg/min | INTRAVENOUS | Status: DC
  Administered 2014-08-21: 10 ug/kg/min via INTRAVENOUS
  Filled 2014-08-21: qty 100

## 2014-08-21 MED ORDER — HEPARIN SODIUM (PORCINE) 1000 UNIT/ML IJ SOLN
INTRAMUSCULAR | Status: DC | PRN
  Administered 2014-08-21: 2.2 mL via INTRA_ARTERIAL

## 2014-08-21 MED ORDER — SODIUM CHLORIDE 0.9 % IV SOLN
1.0000 mg/h | INTRAVENOUS | Status: DC
  Administered 2014-08-21: 1 mg/h via INTRAVENOUS
  Administered 2014-08-22: 2 mg/h via INTRAVENOUS
  Filled 2014-08-21 (×2): qty 10

## 2014-08-21 MED ORDER — SODIUM CHLORIDE 0.9 % IV SOLN
25.0000 ug/h | INTRAVENOUS | Status: DC
  Administered 2014-08-21: 100 ug/h via INTRAVENOUS
  Administered 2014-08-22 – 2014-08-23 (×3): 250 ug/h via INTRAVENOUS
  Filled 2014-08-21 (×4): qty 50

## 2014-08-21 MED ORDER — FENTANYL CITRATE (PF) 250 MCG/5ML IJ SOLN
INTRAMUSCULAR | Status: AC
  Filled 2014-08-21: qty 5

## 2014-08-21 MED ORDER — ARTIFICIAL TEARS OP OINT
1.0000 "application " | TOPICAL_OINTMENT | Freq: Three times a day (TID) | OPHTHALMIC | Status: DC
  Administered 2014-08-21 – 2014-08-23 (×6): 1 via OPHTHALMIC
  Filled 2014-08-21 (×3): qty 3.5

## 2014-08-21 SURGICAL SUPPLY — 46 items
BAG DECANTER FOR FLEXI CONT (MISCELLANEOUS) ×3 IMPLANT
BIOPATCH RED 1 DISK 7.0 (GAUZE/BANDAGES/DRESSINGS) ×2 IMPLANT
BIOPATCH RED 1IN DISK 7.0MM (GAUZE/BANDAGES/DRESSINGS) ×1
CATH CANNON HEMO 15F 50CM (CATHETERS) IMPLANT
CATH CANNON HEMO 15FR 19 (HEMODIALYSIS SUPPLIES) IMPLANT
CATH CANNON HEMO 15FR 23CM (HEMODIALYSIS SUPPLIES) ×2 IMPLANT
CATH CANNON HEMO 15FR 31CM (HEMODIALYSIS SUPPLIES) IMPLANT
CATH CANNON HEMO 15FR 32 (HEMODIALYSIS SUPPLIES) IMPLANT
CATH CANNON HEMO 15FR 32CM (HEMODIALYSIS SUPPLIES) IMPLANT
CATH STRAIGHT 5FR 65CM (CATHETERS) IMPLANT
COVER PROBE W GEL 5X96 (DRAPES) ×3 IMPLANT
DRAPE C-ARM 42X72 X-RAY (DRAPES) ×3 IMPLANT
DRAPE CHEST BREAST 15X10 FENES (DRAPES) ×3 IMPLANT
GAUZE SPONGE 2X2 8PLY STRL LF (GAUZE/BANDAGES/DRESSINGS) ×1 IMPLANT
GAUZE SPONGE 4X4 16PLY XRAY LF (GAUZE/BANDAGES/DRESSINGS) ×3 IMPLANT
GLOVE BIO SURGEON STRL SZ7 (GLOVE) ×3 IMPLANT
GLOVE BIOGEL PI IND STRL 7.5 (GLOVE) ×1 IMPLANT
GLOVE BIOGEL PI INDICATOR 7.5 (GLOVE) ×2
GLOVE SURG SS PI 7.0 STRL IVOR (GLOVE) ×4 IMPLANT
GOWN STRL REUS W/ TWL LRG LVL3 (GOWN DISPOSABLE) ×2 IMPLANT
GOWN STRL REUS W/ TWL XL LVL3 (GOWN DISPOSABLE) IMPLANT
GOWN STRL REUS W/TWL LRG LVL3 (GOWN DISPOSABLE) ×6
GOWN STRL REUS W/TWL XL LVL3 (GOWN DISPOSABLE) ×3
KIT BASIN OR (CUSTOM PROCEDURE TRAY) ×3 IMPLANT
KIT ROOM TURNOVER OR (KITS) ×3 IMPLANT
LIQUID BAND (GAUZE/BANDAGES/DRESSINGS) IMPLANT
NDL 18GX1X1/2 (RX/OR ONLY) (NEEDLE) ×1 IMPLANT
NDL HYPO 25GX1X1/2 BEV (NEEDLE) ×1 IMPLANT
NEEDLE 18GX1X1/2 (RX/OR ONLY) (NEEDLE) ×3 IMPLANT
NEEDLE HYPO 25GX1X1/2 BEV (NEEDLE) ×3 IMPLANT
NS IRRIG 1000ML POUR BTL (IV SOLUTION) ×3 IMPLANT
PACK SURGICAL SETUP 50X90 (CUSTOM PROCEDURE TRAY) ×3 IMPLANT
PAD ARMBOARD 7.5X6 YLW CONV (MISCELLANEOUS) ×6 IMPLANT
SET MICROPUNCTURE 5F STIFF (MISCELLANEOUS) IMPLANT
SOAP 2 % CHG 4 OZ (WOUND CARE) ×3 IMPLANT
SPONGE GAUZE 2X2 STER 10/PKG (GAUZE/BANDAGES/DRESSINGS) ×2
SUT ETHILON 3 0 PS 1 (SUTURE) ×3 IMPLANT
SUT MNCRL AB 4-0 PS2 18 (SUTURE) ×3 IMPLANT
SYR 20CC LL (SYRINGE) ×6 IMPLANT
SYR 3ML LL SCALE MARK (SYRINGE) ×3 IMPLANT
SYR 5ML LL (SYRINGE) ×3 IMPLANT
SYR CONTROL 10ML LL (SYRINGE) ×3 IMPLANT
SYRINGE 10CC LL (SYRINGE) ×3 IMPLANT
TAPE CLOTH SURG 4X10 WHT LF (GAUZE/BANDAGES/DRESSINGS) ×2 IMPLANT
WATER STERILE IRR 1000ML POUR (IV SOLUTION) ×3 IMPLANT
WIRE AMPLATZ SS-J .035X180CM (WIRE) IMPLANT

## 2014-08-21 NOTE — Consult Note (Signed)
Reason for Consult: Dialysis Referring Physician: Cardiology  HPI: 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 Patient was found to be in acute on chronic heart failure exacerbation and acute on chronic renal failure. Creatinine on admission was 10.77 with GFR of 5. Baseline creatinine is unknown, but last creatinine during hospitalization for CHF exacerbation in 12/2013 was around 5.5-6 and GFR baseline of 12. Patient never followed up with nephrology.   Patient was seen and examined this morning. His main complaint is of hunger. He denies any shortness of breath or pain. He states the main reason he presented to the ED was because of increased swelling. He has seen Dr. Justin Mend in the clinic back in December, but hasn't followed up as he states he was "in denial."   Past Medical History  Diagnosis Date  . Hypertension   . Diabetes mellitus without complication     Past Surgical History  Procedure Laterality Date  . External ear surgery    . Tonsillectomy      Family History  Problem Relation Age of Onset  . Heart failure Mother     Social History:  reports that he has quit smoking. His smoking use included Cigarettes. He has never used smokeless tobacco. He reports that he does not drink alcohol or use illicit drugs.  Allergies: No Known Allergies  Medications:  I have reviewed the patient's current medications. Prior to Admission:  Prescriptions prior to admission  Medication Sig Dispense Refill Last Dose  . aspirin 81 MG chewable tablet Chew 1 tablet (81 mg total) by mouth daily.   08/19/2014 at Unknown time  . calcitRIOL (ROCALTROL) 0.25 MCG capsule Take 1 capsule (0.25 mcg total) by mouth every Monday, Wednesday, and Friday. 30 capsule 1 08/19/2014 at Unknown  time  . carvedilol (COREG) 12.5 MG tablet Take 0.5 tablets (6.25 mg total) by mouth 2 (two) times daily with a meal. 60 tablet 1 08/20/2014 at 1000  . ferrous sulfate 325 (65 FE) MG tablet Take 1 tablet (325 mg total) by mouth daily with breakfast. 30 tablet 3 08/20/2014 at Unknown time  . furosemide (LASIX) 80 MG tablet Take 1 tablet (80 mg total) by mouth 3 (three) times daily. 90 tablet 1 08/20/2014 at Unknown time  . glimepiride (AMARYL) 2 MG tablet Take 1 tablet (2 mg total) by mouth daily with breakfast. 30 tablet 1 08/20/2014 at Unknown time  . hydrALAZINE (APRESOLINE) 25 MG tablet Take 1.5 tablets (37.5 mg total) by mouth 3 (three) times daily. (Patient not taking: Reported on 08/20/2014) 90 tablet 0 Not Taking at Unknown time  . isosorbide dinitrate (ISORDIL) 20 MG tablet Take 1 tablet (20 mg total) by mouth 3 (three) times daily. (Patient not taking: Reported on 08/20/2014) 90 tablet 0 Not Taking at Unknown time  . isosorbide-hydrALAZINE (BIDIL) 20-37.5 MG per tablet Take 1 tablet by mouth 3 (three) times daily. (Patient not taking: Reported on 08/20/2014) 90 tablet 1 Not Taking at Unknown time   Scheduled: . aspirin  81 mg Oral Daily  . calcitRIOL  0.25 mcg Oral Q M,W,F  . carvedilol  6.25 mg Oral BID WC  .  ceFAZolin (ANCEF) IV  1 g Intravenous To SS-Surg  . ferrous sulfate  325 mg Oral Q breakfast  .  glimepiride  2 mg Oral Q breakfast  . heparin  5,000 Units Subcutaneous 3 times per day  . hydrALAZINE  10 mg Oral 3 times per day  . potassium chloride  10 mEq Intravenous Q1 Hr x 4  . sodium chloride  3 mL Intravenous Q12H   Continuous: . sodium chloride 10 mL/hr at 08/20/14 2224  . furosemide (LASIX) infusion 10 mg/hr (08/20/14 2222)   OBS:[id number] **OR** acetaminophen, ondansetron **OR** ondansetron (ZOFRAN) IV  Results for orders placed or performed during the hospital encounter of 08/20/14 (from the past 48 hour(s))  CBC with Differential/Platelet     Status: Abnormal    Collection Time: 08/20/14  1:50 PM  Result Value Ref Range   WBC 6.1 4.0 - 10.5 K/uL   RBC 3.74 (L) 4.22 - 5.81 MIL/uL   Hemoglobin 10.0 (L) 13.0 - 17.0 g/dL   HCT 30.2 (L) 39.0 - 52.0 %   MCV 80.7 78.0 - 100.0 fL   MCH 26.7 26.0 - 34.0 pg   MCHC 33.1 30.0 - 36.0 g/dL   RDW 18.6 (H) 11.5 - 15.5 %   Platelets 291 150 - 400 K/uL   Neutrophils Relative % 73 43 - 77 %   Neutro Abs 4.5 1.7 - 7.7 K/uL   Lymphocytes Relative 18 12 - 46 %   Lymphs Abs 1.1 0.7 - 4.0 K/uL   Monocytes Relative 8 3 - 12 %   Monocytes Absolute 0.5 0.1 - 1.0 K/uL   Eosinophils Relative 1 0 - 5 %   Eosinophils Absolute 0.0 0.0 - 0.7 K/uL   Basophils Relative 0 0 - 1 %   Basophils Absolute 0.0 0.0 - 0.1 K/uL  Comprehensive metabolic panel     Status: Abnormal   Collection Time: 08/20/14  1:50 PM  Result Value Ref Range   Sodium 140 135 - 145 mmol/L   Potassium 3.7 3.5 - 5.1 mmol/L   Chloride 105 101 - 111 mmol/L   CO2 21 (L) 22 - 32 mmol/L   Glucose, Bld 101 (H) 65 - 99 mg/dL   BUN 96 (H) 6 - 20 mg/dL   Creatinine, Ser 10.77 (H) 0.61 - 1.24 mg/dL   Calcium 6.9 (L) 8.9 - 10.3 mg/dL   Total Protein 5.8 (L) 6.5 - 8.1 g/dL   Albumin 2.2 (L) 3.5 - 5.0 g/dL   AST 36 15 - 41 U/L   ALT 50 17 - 63 U/L   Alkaline Phosphatase 154 (H) 38 - 126 U/L   Total Bilirubin 0.6 0.3 - 1.2 mg/dL   GFR calc non Af Amer 5 (L) >60 mL/min   GFR calc Af Amer 5 (L) >60 mL/min    Comment: (NOTE) The eGFR has been calculated using the CKD EPI equation. This calculation has not been validated in all clinical situations. eGFR's persistently <60 mL/min signify possible Chronic Kidney Disease.    Anion gap 14 5 - 15  Troponin I     Status: Abnormal   Collection Time: 08/20/14  1:50 PM  Result Value Ref Range   Troponin I 0.05 (H) <0.031 ng/mL    Comment:        PERSISTENTLY INCREASED TROPONIN VALUES IN THE RANGE OF 0.04-0.49 ng/mL CAN BE SEEN IN:       -UNSTABLE ANGINA       -CONGESTIVE HEART FAILURE       -MYOCARDITIS        -CHEST TRAUMA       -ARRYHTHMIAS       -  LATE PRESENTING MYOCARDIAL INFARCTION       -COPD   CLINICAL FOLLOW-UP RECOMMENDED.   Brain natriuretic peptide     Status: Abnormal   Collection Time: 08/20/14  1:50 PM  Result Value Ref Range   B Natriuretic Peptide >4500.0 (H) 0.0 - 100.0 pg/mL  Phosphorus     Status: Abnormal   Collection Time: 08/20/14  8:58 PM  Result Value Ref Range   Phosphorus 5.6 (H) 2.5 - 4.6 mg/dL  CBC WITH DIFFERENTIAL     Status: Abnormal   Collection Time: 08/20/14  8:58 PM  Result Value Ref Range   WBC 6.8 4.0 - 10.5 K/uL   RBC 4.05 (L) 4.22 - 5.81 MIL/uL   Hemoglobin 10.6 (L) 13.0 - 17.0 g/dL   HCT 32.3 (L) 39.0 - 52.0 %   MCV 79.8 78.0 - 100.0 fL   MCH 26.2 26.0 - 34.0 pg   MCHC 32.8 30.0 - 36.0 g/dL   RDW 18.6 (H) 11.5 - 15.5 %   Platelets 310 150 - 400 K/uL   Neutrophils Relative % 74 43 - 77 %   Neutro Abs 5.1 1.7 - 7.7 K/uL   Lymphocytes Relative 16 12 - 46 %   Lymphs Abs 1.1 0.7 - 4.0 K/uL   Monocytes Relative 8 3 - 12 %   Monocytes Absolute 0.6 0.1 - 1.0 K/uL   Eosinophils Relative 1 0 - 5 %   Eosinophils Absolute 0.1 0.0 - 0.7 K/uL   Basophils Relative 1 0 - 1 %   Basophils Absolute 0.0 0.0 - 0.1 K/uL  APTT     Status: None   Collection Time: 08/20/14  8:58 PM  Result Value Ref Range   aPTT 36 24 - 37 seconds  Protime-INR     Status: Abnormal   Collection Time: 08/20/14  8:58 PM  Result Value Ref Range   Prothrombin Time 16.1 (H) 11.6 - 15.2 seconds   INR 1.28 0.00 - 1.49  TSH     Status: None   Collection Time: 08/20/14  8:58 PM  Result Value Ref Range   TSH 2.329 0.350 - 4.500 uIU/mL  Troponin I     Status: Abnormal   Collection Time: 08/20/14  8:58 PM  Result Value Ref Range   Troponin I 0.06 (H) <0.031 ng/mL    Comment:        PERSISTENTLY INCREASED TROPONIN VALUES IN THE RANGE OF 0.04-0.49 ng/mL CAN BE SEEN IN:       -UNSTABLE ANGINA       -CONGESTIVE HEART FAILURE       -MYOCARDITIS       -CHEST TRAUMA        -ARRYHTHMIAS       -LATE PRESENTING MYOCARDIAL INFARCTION       -COPD   CLINICAL FOLLOW-UP RECOMMENDED.   Comprehensive metabolic panel     Status: Abnormal   Collection Time: 08/20/14  8:58 PM  Result Value Ref Range   Sodium 138 135 - 145 mmol/L   Potassium 3.6 3.5 - 5.1 mmol/L   Chloride 105 101 - 111 mmol/L   CO2 19 (L) 22 - 32 mmol/L   Glucose, Bld 99 65 - 99 mg/dL   BUN 99 (H) 6 - 20 mg/dL   Creatinine, Ser 10.41 (H) 0.61 - 1.24 mg/dL   Calcium 6.8 (L) 8.9 - 10.3 mg/dL   Total Protein 5.6 (L) 6.5 - 8.1 g/dL   Albumin 2.1 (L) 3.5 - 5.0 g/dL   AST  33 15 - 41 U/L   ALT 47 17 - 63 U/L   Alkaline Phosphatase 150 (H) 38 - 126 U/L   Total Bilirubin 0.7 0.3 - 1.2 mg/dL   GFR calc non Af Amer 5 (L) >60 mL/min   GFR calc Af Amer 5 (L) >60 mL/min    Comment: (NOTE) The eGFR has been calculated using the CKD EPI equation. This calculation has not been validated in all clinical situations. eGFR's persistently <60 mL/min signify possible Chronic Kidney Disease.    Anion gap 14 5 - 15  Magnesium     Status: Abnormal   Collection Time: 08/20/14  8:58 PM  Result Value Ref Range   Magnesium 1.6 (L) 1.7 - 2.4 mg/dL  Hemoglobin A1c     Status: Abnormal   Collection Time: 08/20/14  8:58 PM  Result Value Ref Range   Hgb A1c MFr Bld 7.6 (H) 4.8 - 5.6 %    Comment: (NOTE)         Pre-diabetes: 5.7 - 6.4         Diabetes: >6.4         Glycemic control for adults with diabetes: <7.0    Mean Plasma Glucose 171 mg/dL    Comment: (NOTE) Performed At: [facility] [place], [place] [id number] Lindon Romp MD [id number]   Troponin I     Status: Abnormal   Collection Time: 08/21/14  1:49 AM  Result Value Ref Range   Troponin I 0.05 (H) <0.031 ng/mL    Comment:        PERSISTENTLY INCREASED TROPONIN VALUES IN THE RANGE OF 0.04-0.49 ng/mL CAN BE SEEN IN:       -UNSTABLE ANGINA       -CONGESTIVE HEART FAILURE       -MYOCARDITIS       -CHEST TRAUMA        -ARRYHTHMIAS       -LATE PRESENTING MYOCARDIAL INFARCTION       -COPD   CLINICAL FOLLOW-UP RECOMMENDED.   Troponin I     Status: Abnormal   Collection Time: 08/21/14  8:02 AM  Result Value Ref Range   Troponin I 0.04 (H) <0.031 ng/mL    Comment:        PERSISTENTLY INCREASED TROPONIN VALUES IN THE RANGE OF 0.04-0.49 ng/mL CAN BE SEEN IN:       -UNSTABLE ANGINA       -CONGESTIVE HEART FAILURE       -MYOCARDITIS       -CHEST TRAUMA       -ARRYHTHMIAS       -LATE PRESENTING MYOCARDIAL INFARCTION       -COPD   CLINICAL FOLLOW-UP RECOMMENDED.   Comprehensive metabolic panel     Status: Abnormal   Collection Time: 08/21/14  8:02 AM  Result Value Ref Range   Sodium 138 135 - 145 mmol/L   Potassium 3.3 (L) 3.5 - 5.1 mmol/L   Chloride 104 101 - 111 mmol/L   CO2 22 22 - 32 mmol/L   Glucose, Bld 143 (H) 65 - 99 mg/dL   BUN 95 (H) 6 - 20 mg/dL   Creatinine, Ser 10.89 (H) 0.61 - 1.24 mg/dL   Calcium 6.7 (L) 8.9 - 10.3 mg/dL   Total Protein 5.5 (L) 6.5 - 8.1 g/dL   Albumin 1.9 (L) 3.5 - 5.0 g/dL   AST 24 15 - 41 U/L   ALT 38 17 - 63 U/L   Alkaline Phosphatase  130 (H) 38 - 126 U/L   Total Bilirubin 0.5 0.3 - 1.2 mg/dL   GFR calc non Af Amer 4 (L) >60 mL/min   GFR calc Af Amer 5 (L) >60 mL/min    Comment: (NOTE) The eGFR has been calculated using the CKD EPI equation. This calculation has not been validated in all clinical situations. eGFR's persistently <60 mL/min signify possible Chronic Kidney Disease.    Anion gap 12 5 - 15  CBC     Status: Abnormal   Collection Time: 08/21/14  8:02 AM  Result Value Ref Range   WBC 6.7 4.0 - 10.5 K/uL   RBC 3.73 (L) 4.22 - 5.81 MIL/uL   Hemoglobin 9.9 (L) 13.0 - 17.0 g/dL   HCT 30.1 (L) 39.0 - 52.0 %   MCV 80.7 78.0 - 100.0 fL   MCH 26.5 26.0 - 34.0 pg   MCHC 32.9 30.0 - 36.0 g/dL   RDW 18.7 (H) 11.5 - 15.5 %   Platelets 287 150 - 400 K/uL  Glucose, capillary     Status: Abnormal   Collection Time: 08/21/14  8:12 AM  Result Value Ref  Range   Glucose-Capillary 141 (H) 65 - 99 mg/dL    Dg Chest 2 View  08/20/2014   CLINICAL DATA:  Lower extremity edema, CHF and anasarca. History of renal failure.  EXAM: CHEST  2 VIEW  COMPARISON:  01/14/2014 and 06/20/2008 chest radiographs  FINDINGS: Small-moderate bilateral pleural effusions and bibasilar atelectasis again noted.  Mild pulmonary vascular congestion is present.  The visualized cardiomediastinal silhouette is unchanged.  There is no evidence of pneumothorax.  No acute bony abnormalities are identified.  IMPRESSION: Small-moderate bilateral pleural effusions with bibasilar atelectasis.  Mild pulmonary vascular congestion.   Electronically Signed   By: Margarette Canada M.D.   On: 08/20/2014 15:01    ROS General: Admits to fatigue and hunger. Denies fever, chills, and diaphoresis.  Respiratory: Denies SOB, cough  Cardiovascular: Denies chest pain and palpitations.  Gastrointestinal: Admits to abdominal distention. Denies nausea, vomiting, abdominal pain, diarrhea, constipation Genitourinary: Admits to scrotal edema. Denies dysuria, urgency, frequency Musculoskeletal:  Admits to lower extremity edema.  Skin: Denies pallor, rash and wounds.  Neurological: Denies dizziness, headaches, weakness, lightheadedness   Physical Exam Filed Vitals:   08/20/14 1904 08/20/14 2121 08/21/14 0610 08/21/14 0811  BP: 153/111 156/103 156/100   Pulse: 77 78 73   Temp: 98.2 F (36.8 C) 97.9 F (36.6 C) 97.3 F (36.3 C)   TempSrc: Oral Oral Oral   Resp: _0 Height:  _1  (1.854 m)    Weight:  189 lb 1.6 oz (85.775 kg)  233 lb 11 oz (106 kg)  SpO2: 100% 98% 100%    General: Vital signs reviewed.  Patient is well-developed and well-nourished, in no acute distress and cooperative with exam.   Cardiovascular: RRR, S1 normal, S2 normal Pulmonary/Chest: Mild inspiratory crackles in lower lung fields, no wheezes, or rhonchi. Abdominal: Distended, tight. Non-tender, hypoactive BS  + Extremities: 2+ woody pitting lower extremity edema bilaterally  Skin: Warm, dry and intact. No rashes or erythema. Psychiatric: Normal mood and affect. speech and behavior is normal. Cognition and memory are normal.   Assessment/Plan:  Acute on Chronic Kidney Disease, Stage V: Likely ESRD. Creatinine >10 on admission and GFR of 5. Unclear baseline, but previously CKD stage V during admission in December 2015 with GFR of 12. UOP yesterday was -850. Patient is agreeable for dialysis. We have  consulted vascular surgery who has recommend possible diatek placement today and future AVF versus AVG.  -Initiate hemodialysis -Place diatek -Obtain AVF vs. AVG when able -Repeat renal function panel tomorrow am -Strict I/Os  Hypokalemia: Potassium of 3.3 this morning. Will replace IV as patient is NPO for procedure. -KCl 10 mEq IV x 4  HTN: Mildly hypertensive in the 150s/100s. Patient is on Coreg 6.25 mg BID; however, has not received meds since he is NPO. This will likely improve with dialysis. Cardiology plans to start low dose hydralazine and titrate. -Improve BP with dialysis  Anemia of Chronic Disease: Likely secondary to CKD. Hemoglobin 9.9 today, at baseline of 10.  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 Currently on Lasix gtt and Coreg.  -Per Cardiology   Anasarca: Patient was profoundly anasarcous with scrotal edema on presentation with ascites. Plans for US paracentesis today which will improve volume overload in addition to dialysis.  -Paracentesis  DVT/PE ppx: On Heparin TID  Osa Craver, DO PGY-2 Internal Medicine Resident Pager # [phone] 08/21/2014 10:46 AM

## 2014-08-21 NOTE — Transfer of Care (Signed)
Immediate Anesthesia Transfer of Care Note  Patient: Kyle [name]  Procedure(s) Performed: Procedure(s) with comments: INSERTION OF DIALYSIS CATHETER RIGHT INTERNAL JUGULAR VEIN (N/A) - MAC to General  Patient Location: PACU  Anesthesia Type:MAC  Level of Consciousness: sedated and unresponsive  Airway & Oxygen Therapy: Patient remains intubated per anesthesia plan and Patient placed on Ventilator (see vital sign flow sheet for setting)  Post-op Assessment: Report given to RN and Post -op Vital signs reviewed and stable  Post vital signs: Reviewed and stable  Last Vitals:  Filed Vitals:   08/21/14 1606  BP: 118/80  Pulse: 71  Temp:   Resp: 16    Complications: No apparent anesthesia complications

## 2014-08-21 NOTE — H&P (Signed)
  [facility] [place]  History and Physical Update  The patient was interviewed and re-examined.  [name] previous History and Physical has been reviewed and is unchanged from Dr. Evelena Leyden consult from today.  There is no change in the plan of care: tunneled dialysis catheter placement today followed by eventually permanent access placement once medically stable.  The patient is aware the risks of tunneled dialysis catheter placement include but are not limited to: bleeding, infection, central venous injury, pneumothorax, possible venous stenosis, possible malpositioning in the venous system, and possible infections related to long-term catheter presence. The patient was aware of these risks and agreed to proceed.   Adele Barthel, MD [facility] [place] Office: [phone] Pager: [phone]  08/21/2014, 2:21 PM

## 2014-08-21 NOTE — Progress Notes (Signed)
Inpatient Diabetes Program Recommendations  AACE/ADA: New Consensus Statement on Inpatient Glycemic Control (2013)  Target Ranges:  Prepandial:   less than 140 mg/dL      Peak postprandial:   less than 180 mg/dL (1-2 hours)      Critically ill patients:  140 - 180 mg/dL   Reason for Visit: Diabetes Consult  Diabetes history: DM2 Outpatient Diabetes medications: amaryl 2 mg Current orders for Inpatient glycemic control: Novolog resistant Q4H  HgbA1C - 7.6%  Received order for diabetes consult. Agree with current orders. If TF is started or CBGs > 180 mg/dL,  recommend ICU Hyperglycemia Protocol. Will continue to follow.  Thank you. Lorenda Peck, RD, LDN, CDE Inpatient Diabetes Coordinator [phone]

## 2014-08-21 NOTE — Procedures (Signed)
Central Venous Catheter Insertion Procedure Note Kyle [name] [id number] 1955/04/27  Procedure: Insertion of Central Venous Catheter Indications: Assessment of intravascular volume and Drug and/or fluid administration  Procedure Details Consent: Unable to obtain consent because of emergent medical necessity. Time Out: Verified patient identification, verified procedure, site/side was marked, verified correct patient position, special equipment/implants available, medications/allergies/relevent history reviewed, required imaging and test results available.  Performed  Maximum sterile technique was used including antiseptics, cap, gloves, gown, hand hygiene, mask and sheet. Skin prep: Chlorhexidine; local anesthetic administered A antimicrobial bonded/coated triple lumen catheter was placed in the left internal jugular vein using the Seldinger technique.  Evaluation Blood flow good Complications: No apparent complications Patient did tolerate procedure well. Chest X-ray ordered to verify placement.  CXR: pending.  U/S used in placement.  Kyle [name] 08/21/2014, 5:26 PM

## 2014-08-21 NOTE — Consult Note (Signed)
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  Pt was tx to [facility] for placement of tunneled HD cath for initiation of dialysis.  He went to OR 7/27 for placement of Tunneled cath (concious sedation only) and intra-operatively (post catheter placement) had bradycardia and ultimately PEA arrest with approx 10 mins CPR before ROSC.  No purposeful mental status post arrest. Tx to ICU and PCCM consulted.   STUDIES:    SIGNIFICANT EVENTS:    HISTORY OF PRESENT ILLNESS:   59 yo male with PMHx of CKD Stage V, chronic systolic and diastolic CHF (EF 0000000), HTN and T2DM who presented to [facility] ED on 08/20/14 with complaints of increasing shortness of breath, fatigue, and scrotal and lower extremity edema and abdominal distention. Patient was found to be in acute on chronic heart failure exacerbation and acute on chronic renal failure. Creatinine on admission was 10.77 with GFR of 5. Baseline creatinine is unknown, but last creatinine during hospitalization for CHF exacerbation in 12/2013 was around 5.5-6 and GFR baseline of 12. Patient never followed up with nephrology. He was seen by nephrology and to be started on HD pending tunneled cath placement. He went to OR 7/27 for placement of Tunneled cath (concious sedation only) and intra-operatively (post catheter placement) had bradycardia and ultimately PEA arrest with approx 10 mins CPR before ROSC.  No purposeful mental status post arrest. Tx to ICU and PCCM consulted.  PAST MEDICAL HISTORY :   has a past medical history of Hypertension and Diabetes mellitus without complication.   has past surgical history that includes External ear surgery and Tonsillectomy. Prior to Admission medications   Medication Sig Start Date End Date Taking? Authorizing Provider  aspirin 81 MG chewable tablet Chew 1 tablet (81 mg total) by mouth daily. 01/17/14  Yes Kyle Dubois, MD  calcitRIOL (ROCALTROL) 0.25 MCG capsule Take 1 capsule (0.25 mcg total) by mouth every Monday, Wednesday, and Friday. 01/17/14  Yes Kyle Dubois, MD  carvedilol (COREG) 12.5 MG tablet Take 0.5 tablets (6.25 mg total) by mouth 2 (two) times daily with a meal. 01/17/14  Yes Kyle Dubois, MD  ferrous sulfate 325 (65 FE) MG tablet Take 1 tablet (325 mg total) by mouth daily with breakfast. 01/18/14  Yes Kyle Dubois, MD  furosemide (LASIX) 80 MG tablet Take 1 tablet (80 mg total) by mouth 3 (three) times daily. 01/17/14  Yes Kyle Dubois, MD  glimepiride (AMARYL) 2 MG tablet Take 1 tablet (2 mg total) by mouth daily with breakfast. 01/17/14  Yes Kyle Dubois, MD  hydrALAZINE (APRESOLINE) 25 MG tablet Take 1.5 tablets (37.5 mg total) by mouth 3 (three) times daily. Patient not taking: Reported on 08/20/2014 01/17/14 01/17/15  Kyle Quill, DO  isosorbide dinitrate (ISORDIL) 20 MG tablet Take 1 tablet (20 mg total) by mouth 3 (three) times daily. Patient not taking: Reported on 08/20/2014 01/17/14   Kyle Quill, DO  isosorbide-hydrALAZINE (BIDIL) 20-37.5 MG per tablet Take 1 tablet by mouth 3 (three) times daily. Patient not taking: Reported on 08/20/2014 01/17/14   Kyle James  Dyann Kief, MD   No Known Allergies  FAMILY HISTORY:  indicated that his mother is deceased. He indicated that his father is deceased.  SOCIAL HISTORY:  reports that he has quit smoking. His smoking use included Cigarettes. He has never used smokeless tobacco. He reports that he does not drink alcohol or use illicit drugs.  REVIEW OF SYSTEMS:  Unable.  See HPI.   SUBJECTIVE:   VITAL SIGNS: Temp:  [97.3 F (36.3 C)-98.2 F (36.8 C)] 97.3 F  (36.3 C) (07/27 0610) Pulse Rate:  [71-79] 71 (07/27 1606) Resp:  [16-23] 16 (07/27 1606) BP: (118-169)/(80-111) 118/80 mmHg (07/27 1606) SpO2:  [96 %-100 %] 100 % (07/27 1606) FiO2 (%):  [60 %] 60 % (07/27 1606) Weight:  [189 lb 1.6 oz (85.775 kg)-233 lb 11 oz (106 kg)] 233 lb 11 oz (106 kg) (07/27 0811) HEMODYNAMICS:   VENTILATOR SETTINGS: Vent Mode:  [-] PRVC FiO2 (%):  [60 %] 60 % Set Rate:  [16 bmp] 16 bmp Vt Set:  Kyle [name]] 620 [name] PEEP:  [5 cmH20] 5 cmH20 Plateau Pressure:  [20 cmH20] 20 cmH20 INTAKE / OUTPUT:  Intake/Output Summary (Last 24 hours) at 08/21/14 1652 Last data filed at 08/21/14 1545  Gross per 24 hour  Intake 1610.32 [name]  Output   1575 [name]  Net  35.32 [name]    PHYSICAL EXAMINATION: General:  Chronically ill appearing male, acutely ill post cardiac arrest  Neuro:  Minimal response, no purposeful movement  HEENT:  Mm moist, ETT, mild JVD  Cardiovascular:  s1s2 rrr, Kyle [name] tunneled cath c/d  Lungs:  resps even non labored on vent, diminished bases Abdomen:  Soft, +bs  Musculoskeletal:  Warm and dry, scant BLE edema    LABS:  CBC  Recent Labs Lab 08/20/14 1350 08/20/14 2058 08/21/14 0802  WBC 6.1 6.8 6.7  HGB 10.0* 10.6* 9.9*  HCT 30.2* 32.3* 30.1*  PLT 291 310 287   Coag's  Recent Labs Lab 08/20/14 2058  APTT 36  INR 1.28   BMET  Recent Labs Lab 08/20/14 1350 08/20/14 2058 08/21/14 0802  NA 140 138 138  K 3.7 3.6 3.3*  CL 105 105 104  CO2 21* 19* 22  BUN 96* 99* 95*  CREATININE 10.77* 10.41* 10.89*  GLUCOSE 101* 99 143*   Electrolytes  Recent Labs Lab 08/20/14 1350 08/20/14 2058 08/21/14 0802  CALCIUM 6.9* 6.8* 6.7*  MG  --  1.6*  --   PHOS  --  5.6*  --    Sepsis Markers No results for input(s): LATICACIDVEN, PROCALCITON, O2SATVEN in the last 168 hours. ABG No results for input(s): PHART, PCO2ART, PO2ART in the last 168 hours. Liver Enzymes  Recent Labs Lab 08/20/14 1350 08/20/14 2058 08/21/14 0802  AST 36 33  24  ALT 50 47 38  ALKPHOS 154* 150* 130*  BILITOT 0.6 0.7 0.5  ALBUMIN 2.2* 2.1* 1.9*   Cardiac Enzymes  Recent Labs Lab 08/20/14 2058 08/21/14 0149 08/21/14 0802  TROPONINI 0.06* 0.05* 0.04*   Glucose  Recent Labs Lab 08/21/14 0812 08/21/14 1353  GLUCAP 141* 97    Imaging Dg Fluoro Guide Cv Line-no Report  08/21/2014   CLINICAL DATA:    FLOURO GUIDE CV LINE  Fluoroscopy was utilized by the requesting physician.  No radiographic  interpretation.      ASSESSMENT / PLAN:  PULMONARY OETT 7/27>>> Acute respiratory failure - post cardiac arrest Volume overload  P:   Vent support - 8cc/kg  F/u CXR  Stat ABG   CARDIOVASCULAR Kyle Maish Vaya tunneled cath 7/27>>> L IJ CVL 7/27>>>  PEA arrest - unclear etiology. Not hyperkalemic. Hx does not support PE.  Also had paracentesis 7/27, ?? Poor cardiac status did not tol fluid shifts although remains hypertensive.  Acute on chronic combined CHF - EF 15% anasarca  HTN  P:  F/u troponin  Hypothermia protocol  Cards to see  Hold BB Continue lasix gtt as BP tolerates  Renal to see as below for ?volume removal  Stat echo  Nephrology to see for ?volume removal - see renal   RENAL Acute on CKD V - likely now ESRD  P:   Stat chem  Renal to see  q4 BMET per hypothermia protocol    GASTROINTESTINAL Ascites  P:   PPI    HEMATOLOGIC Anemia of chronic disease  P:  Stat CBC  SQ heparin   INFECTIOUS No active issue  P:   UC 7/27  Monitor WBC off abx - received peri-op ancef   ENDOCRINE DM  P:   SSI   NEUROLOGIC AMS - no purposeful movement post arrest  P:   RASS goal: -5  hypothermia protocol FAMILY  - Updates:  No family available, Dr Bridgett Larsson continues to try and reach them.   - Inter-disciplinary family meet or Palliative Care meeting due by:  day Ivey, NP 08/21/2014  4:52 PM Pager: [phone] or [phone]  Attending Note:  59 year old male with CHF with EF of 15% who presents  to the hospital in progressive renal failure and was to get an HD catheter placed today, while in the OR the patient had a PEA cardiac arrest for 10 minutes and was not purposeful afterwards.  The patient was intubated and brought to the ICU.  Most likely with very poor EF the patient was not able to tolerate laying flat and conscious sedation as the most likely explanation.  Now that the patient is hypertensive PE and tamponade are less likely.  Will transfer patient to the CCU for cooling since there are no contraindications.  Will perform head CT prior to cooling however.  The patient is critically ill with multiple organ systems failure and requires high complexity decision making for assessment and support, frequent evaluation and titration of therapies, application of advanced monitoring technologies and extensive interpretation of multiple databases.   Critical Care Time devoted to patient care services described in this note is  45  Minutes. This time reflects time of care of this signee Dr Jennet Maduro. This critical care time does not reflect procedure time, or teaching time or supervisory time of PA/NP/Med student/Med Resident etc but could involve care discussion time.  Rush Farmer, M.D. [facility] Pulmonary/Critical Care Medicine. Pager: [phone]. After hours pager: [phone].

## 2014-08-21 NOTE — OR Nursing (Addendum)
Around 1507 Patient became bradycardic,anesthesiologist called to the room stat.Anesthesiologist in the room then patient went to pulseless electrical activity,code blue initiated ,CPR started then transferred to SICU. Please see Anesthesia notes for details.

## 2014-08-21 NOTE — Op Note (Signed)
OPERATIVE NOTE  PROCEDURE: 1. Right internal jugular vein tunneled dialysis catheter placement 2. Right internal jugular vein cannulation under ultrasound guidance  PRE-OPERATIVE DIAGNOSIS: end-stage renal failure  POST-OPERATIVE DIAGNOSIS: same as above  SURGEON: Adele Barthel, MD  ANESTHESIA: local and MAC  ESTIMATED BLOOD LOSS: 30 cc  FINDING(S): 1.  J-wire tracking in inferior vena cava 2.  Serial venous dilation with dilator passage over wire under fluoroscopy   SPECIMEN(S):  none  INDICATIONS:   Kyle [name] is a 59 y.o. male w/ acute CHF with prior known EF 15%  who presents with end stage renal disease.  The patient presents for tunneled dialysis catheter placement.  The patient is aware the risks of tunneled dialysis catheter placement include but are not limited to: bleeding, infection, central venous injury, pneumothorax, possible venous stenosis, possible malpositioning in the venous system, and possible infections related to long-term catheter presence.  The patient was aware of these risks and agreed to proceed.  DESCRIPTION: After written full informed consent was obtained from the patient, the patient was taken back to the operating room.  Prior to induction, the patient was given IV antibiotics.  After obtaining adequate sedation, the patient was prepped and draped in the standard fashion for a chest or neck tunneled dialysis catheter placement.   The cannulation site, the catheter exit site, and tract for the subcutaneous tunnel were then anesthestized with a total of 20 cc of a 1:1 mixture of 0.5% Marcaine without epinepherine and 1% Lidocaine with epinepherine.  Under ultrasound guidance, the right internal jugular vein was cannulated with the 18 gauge needle.  A J-wire was then placed down into the inferior vena cava under fluoroscopic guidance.  The wire was then secured in place with a clamp to the drapes.  I then made stab incisions at the neck and exit sites.    I dissected from the exit site to the cannulation site with a metal tunneler.   The subcutaneous tunnel was dilated by passing a plastic dilator over the metal dissector. The wire was then unclamped and I removed the needle.  The skin tract and venotomy was dilated serially with dilators.  Finally, the dilator-sheath was placed under fluoroscopic guidance into the superior vena cava.  The dilator and wire were removed.  A 23 cm Diatek catheter was placed under fluoroscopic guidance down into the right atrium.  The sheath was broken and peeled away while holding the catheter cuff at the level of the skin.  The back end of this catheter was transected, revealing the two lumens of this catheter.  The ports were docked onto these two lumens.  The catheter hub was then screwed into place.  Each port was tested by aspirating and flushing.  No resistance was noted.  Each port was then thoroughly flushed with heparinized saline.  The catheter was secured in placed with two interrupted stitches of 3-0 Nylon tied to the catheter.    At this point, the patient acutely became bradycardiac and CPR had be started.  I placed a 2 x 2 sterile dressing over the neck incision and affixed it in place with a Tegraderm.  At this point, one port was used for the patient's CPR resuscitation.  After the patient was stabilized, I removed the prior Tegraderm over the neck incision and reprepped the neck incision with Betadine.    I sterilely stapled the neck incision.  The neck and chest incision were cleaned and sterile bandages applied.  The  other port was then loaded with concentrated heparin (1000 Units/mL) at the manufacturer recommended volume.  Sterile caps were applied to the unused port.  Completion fluoroscopy could not be done due to patient's critical status.  He was transferred to the MICU for further resuscitation.   COMPLICATIONS: none  CONDITION: stable   Adele Barthel, MD [facility]  [place] Office: [phone] Pager: [phone]  08/21/2014, 3:45 PM

## 2014-08-21 NOTE — Anesthesia Preprocedure Evaluation (Addendum)
Anesthesia Evaluation  Patient identified by MRN, date of birth, ID band Patient awake    Reviewed: Allergy & Precautions, NPO status , Patient's Chart, lab work & pertinent test results  History of Anesthesia Complications Negative for: history of anesthetic complications  Airway Mallampati: I  TM Distance: >3 FB Neck ROM: Full    Dental  (+) Teeth Intact, Dental Advisory Given   Pulmonary former smoker,  breath sounds clear to auscultation        Cardiovascular hypertension, +CHF and + DOE Rhythm:Regular Rate:Normal  EF at 15%   Neuro/Psych    GI/Hepatic   Endo/Other  diabetes, Poorly Controlled  Renal/GU CRF and Renal InsufficiencyRenal disease     Musculoskeletal   Abdominal   Peds  Hematology  (+) anemia ,   Anesthesia Other Findings   Reproductive/Obstetrics                            Anesthesia Physical Anesthesia Plan  ASA: IV  Anesthesia Plan: MAC   Post-op Pain Management:    Induction: Intravenous  Airway Management Planned: Natural Airway and Simple Face Mask  Additional Equipment:   Intra-op Plan:   Post-operative Plan:   Informed Consent: I have reviewed the patients History and Physical, chart, labs and discussed the procedure including the risks, benefits and alternatives for the proposed anesthesia with the patient or authorized representative who has indicated his/her understanding and acceptance.   Dental advisory given  Plan Discussed with: CRNA and Surgeon  Anesthesia Plan Comments:         Anesthesia Quick Evaluation

## 2014-08-21 NOTE — Progress Notes (Addendum)
   Daily Progress Note  During [facility] placement, patient developed bradycardia and went into PEA.  He required CPR and ACLS to resuscitate him.  The patient was transferred to the MICU for further management  - PCCM already contacted  - All phone numbers on his Facesheet have been contacted without any responses as most numbers have been disconnected.  There was no answering machine at the one phone number that picked up.  - pCXR to evaluate intubation and TDC postition  Adele Barthel, MD [facility] [place] Office: [phone] Pager: [phone]  08/21/2014, 3:58 PM

## 2014-08-21 NOTE — Progress Notes (Signed)
Patient Name: Kyle [name] Date of Encounter: 08/21/2014  Principal Problem:   Anasarca Active Problems:   Acute on chronic combined systolic and diastolic CHF, NYHA class 2   CKD (chronic kidney disease) stage 5, GFR less than 15 ml/min   Benign essential HTN   DM (diabetes mellitus), type 2, uncontrolled, with renal complications   Troponin level elevated   Anemia of chronic disease   Cardiorenal syndrome with renal failure   Primary Cardiologist: Dr Johnsie Cancel  Patient Profile: 59 year old AA male with past medical history of HTN, DM, stage V CKD and chronic systolic HF with baseline EF 15% presented 07/26 with worsening renal function and evidence of biventricular failure, ascites.   SUBJECTIVE: Says will be more compliant w/ rx going forward. Breathing better.  OBJECTIVE Filed Vitals:   08/20/14 1749 08/20/14 1904 08/20/14 2121 08/21/14 0610  BP: 169/110 153/111 156/103 156/100  Pulse: 79 77 78 73  Temp: 97.4 F (36.3 C) 98.2 F (36.8 C) 97.9 F (36.6 C) 97.3 F (36.3 C)  TempSrc: Oral Oral Oral Oral  Resp: 23 20 18 17   Height:   6\' 1"  (1.854 m)   Weight:   189 lb 1.6 oz (85.775 kg)   SpO2: 96% 100% 98% 100%    Intake/Output Summary (Last 24 hours) at 08/21/14 0731 Last data filed at 08/21/14 [id number]  Gross per 24 hour  Intake 1015.66 ml  Output    850 ml  Net 165.66 ml   Filed Weights   08/20/14 2121  Weight: 189 lb 1.6 oz (85.775 kg)    PHYSICAL EXAM General: Well developed, well nourished, male in no acute distress. Head: Normocephalic, atraumatic.  Neck: Supple without bruits, JVD 11 cm. Lungs:  Resp regular and unlabored, decreased BS bases w/ rales. Heart: RRR, S1, S2, + S3, no S4, or murmur; no rub. Abdomen: Soft, non-tender, non-distended, BS + x 4.  Extremities: No clubbing, cyanosis, no edema.  Neuro: Alert and oriented X 3. Moves all extremities spontaneously. Psych: Normal affect.  LABS: CBC: Recent Labs  08/20/14 1350  08/20/14 2058  WBC 6.1 6.8  NEUTROABS 4.5 5.1  HGB 10.0* 10.6*  HCT 30.2* 32.3*  MCV 80.7 79.8  PLT 291 310   INR: Recent Labs  08/20/14 2058  INR [id number]   Basic Metabolic Panel: Recent Labs  08/20/14 1350 08/20/14 2058  NA 140 138  K 3.7 3.6  CL 105 105  CO2 21* 19*  GLUCOSE 101* 99  BUN 96* 99*  CREATININE 10.77* 10.41*  CALCIUM 6.9* 6.8*  MG  --  1.6*  PHOS  --  5.6*   Liver Function Tests: Recent Labs  08/20/14 1350 08/20/14 2058  AST 36 33  ALT 50 47  ALKPHOS 154* 150*  BILITOT 0.6 0.7  PROT 5.8* 5.6*  ALBUMIN 2.2* 2.1*   Cardiac Enzymes: Recent Labs  08/20/14 1350 08/20/14 2058 08/21/14 0149  TROPONINI 0.05* 0.06* 0.05*   BNP:  B NATRIURETIC PEPTIDE  Date/Time Value Ref Range Status  08/20/2014 01:50 PM >4500.0* 0.0 - 100.0 pg/mL Final   Hemoglobin A1C: Recent Labs  08/20/14 2058  HGBA1C 7.6*   Thyroid Function Tests: Recent Labs  08/20/14 2058  TSH 2.329    TELE:  SR, occ PVCs and pairs      Radiology/Studies: Dg Chest 2 View  08/20/2014   CLINICAL DATA:  Lower extremity edema, CHF and anasarca. History of renal failure.  EXAM: CHEST  2 VIEW  COMPARISON:  01/14/2014  and 06/20/2008 chest radiographs  FINDINGS: Small-moderate bilateral pleural effusions and bibasilar atelectasis again noted.  Mild pulmonary vascular congestion is present.  The visualized cardiomediastinal silhouette is unchanged.  There is no evidence of pneumothorax.  No acute bony abnormalities are identified.  IMPRESSION: Small-moderate bilateral pleural effusions with bibasilar atelectasis.  Mild pulmonary vascular congestion.   Electronically Signed   By: Margarette Canada M.D.   On: 08/20/2014 15:01     Current Medications:  . aspirin  81 mg Oral Daily  . calcitRIOL  0.25 mcg Oral Q M,W,F  . carvedilol  6.25 mg Oral BID WC  . ferrous sulfate  325 mg Oral Q breakfast  . glimepiride  2 mg Oral Q breakfast  . heparin  5,000 Units Subcutaneous 3 times per day  .  sodium chloride  3 mL Intravenous Q12H   . sodium chloride 10 mL/hr at 08/20/14 2224  . furosemide (LASIX) infusion 10 mg/hr (08/20/14 2222)    ASSESSMENT AND PLAN: Principal Problem:   Anasarca - per IM, US paracentesis is ordered  Active Problems:   Acute on chronic combined systolic and diastolic CHF, NYHA class 2 - on Lasix GTT w/ no I/O recorded since midnight and no weight today, RN aware - if volume mgt is per HD, not sure milrinone would help    CKD (chronic kidney disease) stage 5, GFR less than 15 ml/min - per IM, Renal consult pending - VVS seeing for possible HD access    Benign essential HTN - on BB, can increase dose - would also benefit from hydralazine and nitrates, discuss timing with MD    Troponin level elevated  - minimal and in setting of renal failure - no further eval unless HD is permanent, then can cath  Otherwise, per IM   DM (diabetes mellitus), type 2, uncontrolled, with renal complications   Anemia of chronic disease   Cardiorenal syndrome with renal failure   Signed, Barrett, Rhonda , PA-C 7:31 AM 08/21/2014  I have examined the patient and reviewed assessment and plan and discussed with patient.  Agree with above as stated.  Severe LV dysfuinction by echo in 12/15.  Restart low dose hydralazine and titrate.  Planning dialysis.  Will likely need advanced heart failure in the future.  [name]

## 2014-08-21 NOTE — Procedures (Signed)
Successful US guided paracentesis from left lower quadrant.  Yielded 2.5 liters of clear yellow fluid.  No immediate complications.  Pt tolerated well.   Specimen was sent for labs.  Amelia Burgard S Delorean Knutzen PA-C 08/21/2014 1:56 PM

## 2014-08-21 NOTE — Consult Note (Signed)
[facility] [place] NOTE   MRN : [id number]  Reason for Consult: ESRD Primary Nephrologist: Dr. Justin Mend  History of Present Illness: 59 y/o male who has stage V CKD presents anasarcic in renal failure with BUN of 98 and Creatinine >10. We have been asked to provide dialysis access via diatek catheter and fistula verses graft.  He is about 40LBs positive with fluid.  Past medical history  chronic systolic HF with baseline EF 15%, hypertension managed with Coreg, Isosorbide and hydralaizine, DM managed with glimepiride.       Current Facility-Administered Medications  Medication Dose Route Frequency Provider Last Rate Last Dose  . 0.9 %  sodium chloride infusion   Intravenous Continuous Robbie Lis, MD 10 mL/hr at 08/20/14 2224    . acetaminophen (TYLENOL) tablet 650 mg  650 mg Oral Q6H PRN Robbie Lis, MD       Or  . acetaminophen (TYLENOL) suppository 650 mg  650 mg Rectal Q6H PRN Robbie Lis, MD      . aspirin chewable tablet 81 mg  81 mg Oral Daily Robbie Lis, MD   81 mg at 08/20/14 2222  . calcitRIOL (ROCALTROL) capsule 0.25 mcg  0.25 mcg Oral Q M,W,F Robbie Lis, MD      . carvedilol (COREG) tablet 6.25 mg  6.25 mg Oral BID WC Robbie Lis, MD   6.25 mg at 08/20/14 2223  . ferrous sulfate tablet 325 mg  325 mg Oral Q breakfast Robbie Lis, MD      . furosemide (LASIX) 250 mg in dextrose 5 % 250 mL (1 mg/mL) infusion  10 mg/hr Intravenous Continuous Almyra Deforest, PA 10 mL/hr at 08/20/14 2222 10 mg/hr at 08/20/14 2222  . glimepiride (AMARYL) tablet 2 mg  2 mg Oral Q breakfast Robbie Lis, MD      . heparin injection 5,000 Units  5,000 Units Subcutaneous 3 times per day Robbie Lis, MD   5,000 Units at 08/20/14 2259  . ondansetron (ZOFRAN) tablet 4 mg  4 mg Oral Q6H PRN Robbie Lis, MD       Or  . ondansetron [facility]) injection 4 mg  4 mg Intravenous Q6H PRN Robbie Lis, MD      . sodium chloride 0.9 % injection 3 mL  3 mL Intravenous Q12H  Robbie Lis, MD   3 mL at 08/20/14 2223    Pt meds include: Statin :No Betablocker: Yes ASA: Yes Other anticoagulants/antiplatelets:   Past Medical History  Diagnosis Date  . Hypertension   . Diabetes mellitus without complication     Past Surgical History  Procedure Laterality Date  . External ear surgery    . Tonsillectomy      Social History History  Substance Use Topics  . Smoking status: Former Smoker    Types: Cigarettes  . Smokeless tobacco: Never Used  . Alcohol Use: No    Family History Family History  Problem Relation Age of Onset  . Heart failure Mother     No Known Allergies   REVIEW OF SYSTEMS  General: [x ] Weight gain, [ ]  Fever, [ ]  chills Neurologic: [ ]  Dizziness, [ ]  Blackouts, [ ]  Seizure [ ]  Stroke, [ ]  "Mini stroke", [ ]  Slurred speech, [ ]  Temporary blindness; [ ]  weakness in arms or legs, [ ]  Hoarseness [ ]  Dysphagia Cardiac: [ ]  Chest pain/pressure, [ x] Shortness of breath at rest [x ]  Shortness of breath with exertion, [ ]  Atrial fibrillation or irregular heartbeat  Vascular: [ ]  Pain in legs with walking, [ ]  Pain in legs at rest, [ ]  Pain in legs at night,  [ ]  Non-healing ulcer, [ ]  Blood clot in vein/DVT,   Pulmonary: [ ]  Home oxygen, [ ]  Productive cough, [ ]  Coughing up blood, [ ]  Asthma,  [ ]  Wheezing [ ]  COPD Musculoskeletal:  [ ]  Arthritis, [ ]  Low back pain, [ ]  Joint pain Hematologic: [ ]  Easy Bruising, [ ]  Anemia; [ ]  Hepatitis Gastrointestinal: [ ]  Blood in stool, [ ]  Gastroesophageal Reflux/heartburn, Urinary: [x ] chronic Kidney disease, [ ]  on HD - [ ]  MWF or [ ]  TTHS, [ ]  Burning with urination, [ ]  Difficulty urinating Skin: [ ]  Rashes, [ ]  Wounds Psychological: [ ]  Anxiety, [ ]  Depression  Physical Examination Filed Vitals:   08/20/14 1749 08/20/14 1904 08/20/14 2121 08/21/14 0610  BP: 169/110 153/111 156/103 156/100  Pulse: 79 77 78 73  Temp: 97.4 F (36.3 C) 98.2 F (36.8 C) 97.9 F (36.6 C) 97.3 F  (36.3 C)  TempSrc: Oral Oral Oral Oral  Resp: 23 20 18 17   Height:   6\' 1"  (1.854 m)   Weight:   189 lb 1.6 oz (85.775 kg)   SpO2: 96% 100% 98% 100%   Body mass index is 24.95 kg/(m^2).  General:  WDWN in NAD Gait: Normal HENT: WNL Eyes: Pupils equal Pulmonary: normal non-labored breathing , without Rales, rhonchi,  wheezing Cardiac: RRR, without  Murmurs, rubs or gallops; No carotid bruits Abdomen: distended and firm, NT, no masses Skin: no rashes, ulcers noted;  no Gangrene , no cellulitis; no open wounds;   Vascular Exam/Pulses:palpable radial pulses Bilaterally, visualized cephalic vein bilateral UE.   Musculoskeletal: no muscle wasting or atrophy; no edema  Neurologic: A&O X 3; Appropriate Affect ;  SENSATION: normal; MOTOR FUNCTION: 5/5 Symmetric Speech is fluent/normal   Significant Diagnostic Studies: CBC Lab Results  Component Value Date   WBC 6.8 08/20/2014   HGB 10.6* 08/20/2014   HCT 32.3* 08/20/2014   MCV 79.8 08/20/2014   PLT 310 08/20/2014    BMET    Component Value Date/Time   NA 138 08/20/2014 2058   K 3.6 08/20/2014 2058   CL 105 08/20/2014 2058   CO2 19* 08/20/2014 2058   GLUCOSE 99 08/20/2014 2058   BUN 99* 08/20/2014 2058   CREATININE 10.41* 08/20/2014 2058   CALCIUM 6.8* 08/20/2014 2058   GFRNONAA 5* 08/20/2014 2058   GFRAA 5* 08/20/2014 2058   Estimated Creatinine Clearance: 8.6 mL/min (by C-G formula based on Cr of 10.41).  COAG Lab Results  Component Value Date   INR 1.28 08/20/2014   Chest x ray  IMPRESSION: Small-moderate bilateral pleural effusions with bibasilar atelectasis.  Mild pulmonary vascular congestion.  Non-Invasive Vascular Imaging:  Pending  ASSESSMENT/PLAN:  Acute on CKD stage V will keep NPO for possible diatek placement today and likely AV fistula verses graft  In the future once he has reduced fluid and increase protein for incisional healing. Chronic biventricular heart failure Ascites on lasix  drip, coag PT 16.1, INR 1.28 Liver function study low albumin 2.1, total protein 5.6, abdominal ultrasound shows no focal liver abnormalities     Theda Sers, [facility] 08/21/2014 7:51 AM

## 2014-08-21 NOTE — Progress Notes (Addendum)
   Daily Progress Note  Radiology: Dg Chest 2 View  08/20/2014   CLINICAL DATA:  Lower extremity edema, CHF and anasarca. History of renal failure.  EXAM: CHEST  2 VIEW  COMPARISON:  01/14/2014 and 06/20/2008 chest radiographs  FINDINGS: Small-moderate bilateral pleural effusions and bibasilar atelectasis again noted.  Mild pulmonary vascular congestion is present.  The visualized cardiomediastinal silhouette is unchanged.  There is no evidence of pneumothorax.  No acute bony abnormalities are identified.  IMPRESSION: Small-moderate bilateral pleural effusions with bibasilar atelectasis.  Mild pulmonary vascular congestion.   Electronically Signed   By: Margarette Canada M.D.   On: 08/20/2014 15:01   Dg Chest Port 1 View  08/21/2014   CLINICAL DATA:  Hypoxia  EXAM: PORTABLE CHEST - 1 VIEW  COMPARISON:  August 20, 2014  FINDINGS: Endotracheal tube tip is 2.9 cm above the carina. Left jugular catheter tip is in the superior vena cava just beyond the junction with the left innominate vein. Dual-lumen catheter tip is in the right atrium. Nasogastric tube tip and side port are below the diaphragm. No pneumothorax. There is cardiomegaly with bilateral effusions and alveolar edema bilaterally. No adenopathy.  IMPRESSION: Congestive heart failure. Tube and catheter positions as described without pneumothorax.   Electronically Signed   By: Lowella Grip III M.D.   On: 08/21/2014 18:41   Dg Abd Portable 1v  08/21/2014   CLINICAL DATA:  OG tube placement  EXAM: PORTABLE ABDOMEN - 1 VIEW  COMPARISON:  None.  FINDINGS: NG tube with tip in the gastric body. Paucity of gas in the abdomen. Bilateral pleural effusions.  IMPRESSION: NG tube with tip in the stomach.   Electronically Signed   By: Suzy Bouchard M.D.   On: 08/21/2014 18:42   Dg Fluoro Guide Cv Line-no Report  08/21/2014   CLINICAL DATA:    FLOURO GUIDE CV LINE  Fluoroscopy was utilized by the requesting physician.  No radiographic  interpretation.    pCXR  demonstrates appropriate position of TDC in RA.  No obvious signs of PTX.  - Doubt this patient will be medically stable for a while.  VVS will be by next week to re-eval the patient prior to considering any access placement  - Still unable to contact any family in regards to events earlier today  Adele Barthel, MD [facility] [place] Office: [phone] Pager: [phone]  08/21/2014, 9:43 PM

## 2014-08-21 NOTE — Progress Notes (Signed)
[facility]                                                                              Patient Demographics  Kyle [name], is a 59 y.o. male, DOB - 05/23/1955, [id number]  Admit date - 08/20/2014   Admitting Physician Kyle Lis, MD  Outpatient Primary MD for the patient is No primary care provider on file.  LOS - 1   Chief Complaint  Patient presents with  . Bloated  . Hypertension      HPI on 08/21/2014 by Kyle [name] 59 year old with past medical history of chronic systolic and diastolic CHF (baseline EF 15% and grade 2 diastolic dysfunction from 2 D ECHO in 12/2013), hypertension, CKD stage 5, diabetes who presented to [facility] ED with worsening abdominal distention, scrotal and leg swelling over past few weeks prior to this admission. He also reported weight gain over past few months and feeling very tired and short Of breath with even minimal exertion. No reports of chest pain or palpitations. Patient does endorse poor compliance with medical recommendations and has failed to follow up with renal and cardiology. As noted, his most recent hospitalization was in 12/2013 for acute CHF exacerbation. He was discharge on Lasix,coreg and bidil (pt reported he no longer takes bidil and previously taken lisinopril was also stopped due to CKD). Patient reports no abdominal pain, nausea or vomiting. He does however endorse worsening abdominal distention. No blood in stool or urine. No fevers. No lightheadedness or cough. On admission, vitals signs were stable. His blood work revealed hemoglobin of 10, creatinine of 10.747, mild elevation in troponin level, 0.05. Cardiology has seen the pt in consultation. Renal (Dr. Justin [name]) will see the pt in the morning. Patient will be transferred to [facility] due to likelihood on needing dialysis.  Assessment & Plan   Acute on chronic combined systolic and diastolic CHF, NYHA class 2 / Anasarca  - Cardiology consulted and appreciated - Echocardiogram  12/2013 showed EF of 15% with grade 2 diastolic dysfunction - Pt will likely need HD and plan is for nephrology to see the pt in am - Cardiology started lasix IV drip and hydralazine  - continue aspirin, coreg - Daily weight and strict intake and output - Replete electrolytes as needed   CKD (chronic kidney disease) stage 5, GFR less than 15 ml/min - Creatinine 10.77 on admission, currently 10.89 - Nephrology consulted and appreciated - Vascular surgery consulted and appreciated for AVF and tunneled cath - Pt will likely require HD - UE Venous mapping ordered  Benign essential HTN - Continue Coreg - hydralazine added today  DM (diabetes mellitus), type 2, uncontrolled, with renal complications - Hemoglobin A1c 7.6 - Continue amaryl  Troponin level elevated - Likely demand ischemia from acute on chronic renal failure - Cycle cardiac enzymes - Cardio following; patient will need L&RHC once he starts HD  Anemia of chronic disease - Due to CKD - Hemoglobin 9.9 - Continue to monitor CBC   Ascites / Scrotal edema - Patient had paracentesis today - Supportive care for overall edema - Hope that lasix will help with scrotal edema as well   Code Status: Full  Family  Communication: None at bedside  Disposition Plan: Admitted. Pending vascular intervention.  Continue lasix.  Time Spent in minutes   30 minutes  Procedures  Paracentesis   Consults   Vascular surgery Nephrology Cardiology Interventional radiology  DVT Prophylaxis  Heparin  Lab Results  Component Value Date   PLT 287 08/21/2014    Medications  Scheduled Meds: . [MAR Hold] aspirin  81 mg Oral Daily  . [MAR Hold] calcitRIOL  0.25 mcg Oral Q M,W,F  . [MAR Hold] carvedilol  6.25 mg Oral BID WC  . [MAR Hold]  ceFAZolin (ANCEF) IV  1 g Intravenous To SS-Surg  . [MAR Hold] ferrous sulfate  325 mg Oral Q breakfast  . [MAR Hold] glimepiride  2 mg Oral Q breakfast  . [MAR Hold] heparin  5,000 Units  Subcutaneous 3 times per day  . [MAR Hold] hydrALAZINE  10 mg Oral 3 times per day  . lidocaine (PF)      . [MAR Hold] sodium chloride  3 mL Intravenous Q12H   Continuous Infusions: . sodium chloride 10 mL/hr at 08/21/14 1350  . sodium chloride    . furosemide (LASIX) infusion Stopped (08/21/14 1334)   PRN Meds:.0.9 % irrigation (POUR BTL), [MAR Hold] acetaminophen **OR** [MAR Hold] acetaminophen, heparin 6000 unit irrigation, lidocaine-EPINEPHrine, [MAR Hold] ondansetron **OR** [MAR Hold] ondansetron (ZOFRAN) IV  Antibiotics    Anti-infectives    Start     Dose/Rate Route Frequency Ordered Stop   08/21/14 1353  [MAR Hold]  ceFAZolin (ANCEF) IVPB 1 g/50 mL premix     (MAR Hold since 08/21/14 1339)  Comments:  Send with pt to OR   1 g 100 mL/hr over 30 Minutes Intravenous To [facility]Stay [facility] 08/21/14 0837 08/22/14 1400      Subjective:   Kyle [name] seen and examined today.  Patient states he is feeling slightly better.  He feels his abdomen is very distended.  He denies abdominal pain, chest pain, dizziness, headache, nausea/vomiting.    Objective:   Filed Vitals:   08/21/14 0811 08/21/14 1215 08/21/14 1228 08/21/14 1241  BP:  157/106 144/106 154/103  Pulse:      Temp:      TempSrc:      Resp:      Height:      Weight: 106 kg (233 lb 11 oz)     SpO2:        Wt Readings from Last 3 Encounters:  08/21/14 106 kg (233 lb 11 oz)  01/17/14 99.9 kg (220 lb 3.8 oz)     Intake/Output Summary (Last 24 hours) at 08/21/14 1515 Last data filed at 08/21/14 1146  Gross per 24 hour  Intake 1210.32 ml  Output   1325 ml  Net -114.68 ml    Exam  General: Well developed, well nourished, NAD, appears stated age  59: NCAT,  mucous membranes moist.   Neck: Supple, + JVD, no masses  Cardiovascular: S1 S2 auscultated, no rubs, murmurs or gallops. Regular rate and rhythm.  Respiratory: Diminished breath sounds at the bases otherwise clear   Abdomen: Nontender,  distended, + bowel sounds  Extremities: warm dry without cyanosis clubbing. +1 edema LE   Neuro: AAOx3, nonfocal  Psych: Normal affect and demeanor with intact judgement and insight  Data Review   Micro Results Recent Results (from the past 240 hour(s))  Surgical pcr screen     Status: Abnormal   Collection Time: 08/21/14 11:55 AM  Result Value Ref Range Status   MRSA,  PCR NEGATIVE NEGATIVE Final   Staphylococcus aureus POSITIVE (A) NEGATIVE Final    Comment:        The Xpert SA Assay (FDA approved for NASAL specimens in patients over 57 years of age), is one component of a comprehensive surveillance program.  Test performance has been validated by [facility] for patients greater than or equal to 32 year old. It is not intended to diagnose infection nor to guide or monitor treatment.   Culture, body fluid-bottle     Status: None (Preliminary result)   Collection Time: 08/21/14 12:43 PM  Result Value Ref Range Status   Specimen Description FLUID PARACENTESIS  Final   Special Requests NONE  Final   Culture PENDING  Incomplete   Report Status PENDING  Incomplete    Radiology Reports Dg Chest 2 View  08/20/2014   CLINICAL DATA:  Lower extremity edema, CHF and anasarca. History of renal failure.  EXAM: CHEST  2 VIEW  COMPARISON:  01/14/2014 and 06/20/2008 chest radiographs  FINDINGS: Small-moderate bilateral pleural effusions and bibasilar atelectasis again noted.  Mild pulmonary vascular congestion is present.  The visualized cardiomediastinal silhouette is unchanged.  There is no evidence of pneumothorax.  No acute bony abnormalities are identified.  IMPRESSION: Small-moderate bilateral pleural effusions with bibasilar atelectasis.  Mild pulmonary vascular congestion.   Electronically Signed   By: Margarette Canada M.D.   On: 08/20/2014 15:01    CBC  Recent Labs Lab 08/20/14 1350 08/20/14 2058 08/21/14 0802  WBC 6.1 6.8 6.7  HGB 10.0* 10.6* 9.9*  HCT 30.2* 32.3* 30.1*    PLT 291 310 287  MCV 80.7 79.8 80.7  MCH 26.7 26.2 26.5  MCHC 33.1 32.8 32.9  RDW 18.6* 18.6* 18.7*  LYMPHSABS 1.1 1.1  --   MONOABS 0.5 0.6  --   EOSABS 0.0 0.1  --   BASOSABS 0.0 0.0  --     Chemistries   Recent Labs Lab 08/20/14 1350 08/20/14 2058 08/21/14 0802  NA 140 138 138  K 3.7 3.6 3.3*  CL 105 105 104  CO2 21* 19* 22  GLUCOSE 101* 99 143*  BUN 96* 99* 95*  CREATININE 10.77* 10.41* 10.89*  CALCIUM 6.9* 6.8* 6.7*  MG  --  1.6*  --   AST 36 33 24  ALT 50 47 38  ALKPHOS 154* 150* 130*  BILITOT 0.6 0.7 0.5   ------------------------------------------------------------------------------------------------------------------ estimated creatinine clearance is 9.3 mL/min (by C-G formula based on Cr of 10.89). ------------------------------------------------------------------------------------------------------------------  Recent Labs  08/20/14 2058  HGBA1C 7.6*   ------------------------------------------------------------------------------------------------------------------ No results for input(s): CHOL, HDL, LDLCALC, TRIG, CHOLHDL, LDLDIRECT in the last 72 hours. ------------------------------------------------------------------------------------------------------------------  Recent Labs  08/20/14 2058  TSH 2.329   ------------------------------------------------------------------------------------------------------------------ No results for input(s): VITAMINB12, FOLATE, FERRITIN, TIBC, IRON, RETICCTPCT in the last 72 hours.  Coagulation profile  Recent Labs Lab 08/20/14 2058  INR 1.28    No results for input(s): DDIMER in the last 72 hours.  Cardiac Enzymes  Recent Labs Lab 08/20/14 2058 08/21/14 0149 08/21/14 0802  TROPONINI 0.06* 0.05* 0.04*   ------------------------------------------------------------------------------------------------------------------ Invalid input(s): POCBNP    Ahleah Simko D.O. on 08/21/2014 at 3:15  PM  Between 7am to 7pm - Pager - [phone]  After 7pm go to [email or web address] - password [email or web address]  And look for the night coverage person covering for me after hours  [facility] Office  [phone]

## 2014-08-21 NOTE — Progress Notes (Signed)
[place] Progress Note Patient Name: Kyle [name] DOB: Oct 08, 1955 MRN: [id number]   Date of Service  08/21/2014  HPI/Events of Note  Multiple issues: 1. Bradycardia with HR = 40 and 2. K+ = 3.5, Ca++ = 6.3, Albumin = 1.8 and Creatinine = 10.16. Patient is on hypothermia protocol and a Propofol IV infusion.   eICU Interventions  Will order:  1. Will not replace K+ give Creatinine = 10.16. 2. Ca++ corrects to 8.06 given low albumin. Will check Phosphorous prior to repletion of Ca++. 3. Will change sedation from Propofol to Versed IV infusion.      Intervention Category Major Interventions: Arrhythmia - evaluation and management Intermediate Interventions: Electrolyte abnormality - evaluation and management  Sommer,Steven Eugene 08/21/2014, 8:20 PM

## 2014-08-21 NOTE — Progress Notes (Signed)
Patient arrived to CCU from [place]. Central line placement confirmed on CXR by Georgann Housekeeper, NP. Hypothermia protocol initiated and cooling process started at 1845.  Patient's wife Hinton Dyer) and daughter at bedside and updated on patient status. Chaplain offered, and emotional support given to family.

## 2014-08-21 NOTE — Anesthesia Procedure Notes (Signed)
Procedure Name: Intubation Date/Time: 08/14/2014 3:15 PM Performed by: Eligha Bridegroom Pre-anesthesia Checklist: Emergency Drugs available, Patient identified, Suction available, Patient being monitored and Timeout performed Oxygen Delivery Method: Circle system utilized Preoxygenation: Pre-oxygenation with 100% oxygen Ventilation: Mask ventilation without difficulty Laryngoscope Size: Mac and 3 Grade View: Grade I Tube type: Oral Tube size: 7.5 mm Number of attempts: 1 Airway Equipment and Method: Stylet Placement Confirmation: ETT inserted through vocal cords under direct vision,  positive ETCO2 and breath sounds checked- equal and bilateral Secured at: 22 cm Tube secured with: Tape Dental Injury: Teeth and Oropharynx as per pre-operative assessment

## 2014-08-21 NOTE — Progress Notes (Signed)
VASCULAR LAB PRELIMINARY  PRELIMINARY  PRELIMINARY  PRELIMINARY  Right  Upper Extremity Vein Map    Cephalic  Segment Diameter Depth Comment  1. Axilla 2 mm mm   2. Proximal upper arm 1.3 mm mm   3. Mid upper arm 1.6 mm mm   4. Above AC 1.9 mm mm   5. AC mm mm  IV  6. Below AC mm mm  IV  7. Mid forearm 1.8 mm mm   8. Wrist 1.3 mm mm    mm mm    mm mm    Basilic  Segment Diameter Depth Comment  1. Mid upper arm 3.9 mm 6.7 mm Rouleaux flow  2. Above AC 4.8 mm 4.8 mm   3. AC 2.6 mm 3.2 mm Branch   4. Below AC 1.3 mm 2.6 mm    mm mm    mm mm    mm mm    mm mm    mm mm    mm mm    Left Upper Extremity Vein Map    Cephalic  Segment Diameter Depth Comment  1. Axilla 2.2 mm mm   2. Proximal upper arm 1.3 mm mm   3. Mid upper arm 1 mm mm   4. Above AC mm mm Too small to measure  5. AC mm mm Too small to measure  6. Below AC 1.1 mm mm   7. Mid forearm 1.6 mm mm   8. Wrist 1 mm mm    mm mm    mm mm    Basilic  Segment Diameter Depth Comment  1. Mid upper arm 4.1 mm 7.1 mm Rouleaux flow  2. Above AC 1.3 mm 5.3 mm   3. AC mm mm Too small to measure  4. Below AC mm mm Too small to measure   mm mm    mm mm    mm mm    mm mm    mm mm    mm mm     Guinevere Ferrari, RVT 08/21/2014, 9:23 AM

## 2014-08-21 NOTE — Progress Notes (Signed)
    S:  Pt went for dialysis catheter placement (R IJ) this afternoon and during placement, developed bradycardia, hypotension, and pulselessness.  He was intubated and CPR was carried out for 10 mins with eventual ROSC.  He was treated with IV epi in setting of hypotension but this has since been d/c'd and he is now hemodynamically stable.  We've been asked to eval.  O:   Filed Vitals:   08/21/14 1606  BP: 118/80  Pulse: 71  Temp:   Resp: 16   Intubated, sedated.  Lungs cta, cor rrr, abd firm, edematous, ext edematous thighs, dp 1+ bilat.  A/P:  1.  PEA Arrest:  In setting of severe LV dysfxn, sedation, and diatek catheter placement. CPR x 10 mins per report.  Now in sinus rhythm with stable BP.  Repeat ECG.  Cycle CE.  Etiology not clear but suspect severe lv dysfxn and sedation played a role.  Doubt tamponade in setting of current hemodynamic stability.  Plan to resume lasix if hemodynamically stable.  Murray Hodgkins, NP   Patient seen, examined. Available data reviewed. Agree with findings, assessment, and plan as outlined by Ignacia Bayley, NP. Pt examined and he is intubated, unresponsive. Heart rhythm (sinus) and BP now stable. Bradycaric/PEA arrest noted in setting of severe baseline compromise with marked reduction in LV function and renal failure/volume overload. Suspect event related to poor reserve as he now appears hemodynamically stable. Will check EKG and cycle cardiac enzymes. Will continue to follow closely with you.  Sherren Mocha, M.D. 08/21/2014 5:52 PM

## 2014-08-21 NOTE — Progress Notes (Signed)
[place] Progress Note Patient Name: Kyle [name] DOB: 1955-08-06 MRN: [id number]   Date of Service  08/21/2014  HPI/Events of Note  K+ = 3.2, PO4--- + 5.3, Mg++ = 1.6, Ca++ = 6.5, Albumin = 1.8 and Creatinine = 10.29. Ca++ corrects to 8.26 given Albumin level.   eICU Interventions  Will replete Mg++, Ca++ and K+.     Intervention Category Major Interventions: Electrolyte abnormality - evaluation and management  Milon Dethloff Eugene 08/21/2014, 10:27 PM

## 2014-08-22 ENCOUNTER — Encounter (HOSPITAL_COMMUNITY): Payer: Self-pay | Admitting: Vascular Surgery

## 2014-08-22 ENCOUNTER — Inpatient Hospital Stay (HOSPITAL_COMMUNITY): Payer: 59

## 2014-08-22 DIAGNOSIS — L8990 Pressure ulcer of unspecified site, unspecified stage: Secondary | ICD-10-CM | POA: Diagnosis present

## 2014-08-22 DIAGNOSIS — I469 Cardiac arrest, cause unspecified: Secondary | ICD-10-CM

## 2014-08-22 DIAGNOSIS — J9600 Acute respiratory failure, unspecified whether with hypoxia or hypercapnia: Secondary | ICD-10-CM

## 2014-08-22 HISTORY — DX: Pressure ulcer of unspecified site, unspecified stage: L89.90

## 2014-08-22 LAB — GLUCOSE, CAPILLARY
GLUCOSE-CAPILLARY: 126 mg/dL — AB (ref 65–99)
GLUCOSE-CAPILLARY: 113 mg/dL — AB (ref 65–99)
GLUCOSE-CAPILLARY: 124 mg/dL — AB (ref 65–99)
GLUCOSE-CAPILLARY: 104 mg/dL — AB (ref 65–99)
GLUCOSE-CAPILLARY: 77 mg/dL (ref 65–99)
GLUCOSE-CAPILLARY: 60 mg/dL — AB (ref 65–99)
GLUCOSE-CAPILLARY: 111 mg/dL — AB (ref 65–99)
GLUCOSE-CAPILLARY: 80 mg/dL (ref 65–99)
GLUCOSE-CAPILLARY: 114 mg/dL — AB (ref 65–99)
GLUCOSE-CAPILLARY: 68 mg/dL (ref 65–99)
Glucose-Capillary: 107 mg/dL — ABNORMAL HIGH (ref 65–99)
Glucose-Capillary: 118 mg/dL — ABNORMAL HIGH (ref 65–99)
Glucose-Capillary: 120 mg/dL — ABNORMAL HIGH (ref 65–99)
Glucose-Capillary: 123 mg/dL — ABNORMAL HIGH (ref 65–99)
Glucose-Capillary: 123 mg/dL — ABNORMAL HIGH (ref 65–99)
Glucose-Capillary: 134 mg/dL — ABNORMAL HIGH (ref 65–99)
Glucose-Capillary: 117 mg/dL — ABNORMAL HIGH (ref 65–99)
Glucose-Capillary: 108 mg/dL — ABNORMAL HIGH (ref 65–99)
Glucose-Capillary: 86 mg/dL (ref 65–99)
Glucose-Capillary: 84 mg/dL (ref 65–99)

## 2014-08-22 LAB — CBC
HCT: 36.5 % — ABNORMAL LOW (ref 39.0–52.0)
HEMOGLOBIN: 12.5 g/dL — AB (ref 13.0–17.0)
MCH: 26.7 pg (ref 26.0–34.0)
MCHC: 34.2 g/dL (ref 30.0–36.0)
MCV: 78 fL (ref 78.0–100.0)
PLATELETS: 329 10*3/uL (ref 150–400)
RBC: 4.68 MIL/uL (ref 4.22–5.81)
RDW: 18.5 % — AB (ref 11.5–15.5)
WBC: 8.4 10*3/uL (ref 4.0–10.5)

## 2014-08-22 LAB — RENAL FUNCTION PANEL
ANION GAP: 17 — AB (ref 5–15)
Albumin: 1.5 g/dL — ABNORMAL LOW (ref 3.5–5.0)
BUN: 97 mg/dL — ABNORMAL HIGH (ref 6–20)
CALCIUM: 6.5 mg/dL — AB (ref 8.9–10.3)
CHLORIDE: 106 mmol/L (ref 101–111)
CO2: 15 mmol/L — ABNORMAL LOW (ref 22–32)
Creatinine, Ser: 10.24 mg/dL — ABNORMAL HIGH (ref 0.61–1.24)
GFR calc Af Amer: 6 mL/min — ABNORMAL LOW (ref >60)
GFR calc non Af Amer: 5 mL/min — ABNORMAL LOW (ref >60)
GLUCOSE: 135 mg/dL — AB (ref 65–99)
POTASSIUM: 3.5 mmol/L (ref 3.5–5.1)
Phosphorus: 5.4 mg/dL — ABNORMAL HIGH (ref 2.5–4.6)
Sodium: 138 mmol/L (ref 135–145)

## 2014-08-22 LAB — BASIC METABOLIC PANEL
ANION GAP: 12 (ref 5–15)
ANION GAP: 12 (ref 5–15)
Anion gap: 15 (ref 5–15)
Anion gap: 11 (ref 5–15)
Anion gap: 14 (ref 5–15)
BUN: 97 mg/dL — ABNORMAL HIGH (ref 6–20)
BUN: 96 mg/dL — AB (ref 6–20)
BUN: 93 mg/dL — ABNORMAL HIGH (ref 6–20)
BUN: 96 mg/dL — ABNORMAL HIGH (ref 6–20)
BUN: 92 mg/dL — AB (ref 6–20)
CALCIUM: 6.4 mg/dL — AB (ref 8.9–10.3)
CALCIUM: 6.4 mg/dL — AB (ref 8.9–10.3)
CALCIUM: 6.5 mg/dL — AB (ref 8.9–10.3)
CHLORIDE: 112 mmol/L — AB (ref 101–111)
CO2: 15 mmol/L — ABNORMAL LOW (ref 22–32)
CO2: 17 mmol/L — ABNORMAL LOW (ref 22–32)
CO2: 17 mmol/L — AB (ref 22–32)
CO2: 17 mmol/L — ABNORMAL LOW (ref 22–32)
CO2: 17 mmol/L — ABNORMAL LOW (ref 22–32)
Calcium: 6.5 mg/dL — ABNORMAL LOW (ref 8.9–10.3)
Calcium: 6.4 mg/dL — CL (ref 8.9–10.3)
Chloride: 106 mmol/L (ref 101–111)
Chloride: 109 mmol/L (ref 101–111)
Chloride: 109 mmol/L (ref 101–111)
Chloride: 109 mmol/L (ref 101–111)
Creatinine, Ser: 10.31 mg/dL — ABNORMAL HIGH (ref 0.61–1.24)
Creatinine, Ser: 10.12 mg/dL — ABNORMAL HIGH (ref 0.61–1.24)
Creatinine, Ser: 10.33 mg/dL — ABNORMAL HIGH (ref 0.61–1.24)
Creatinine, Ser: 10.06 mg/dL — ABNORMAL HIGH (ref 0.61–1.24)
Creatinine, Ser: 10.31 mg/dL — ABNORMAL HIGH (ref 0.61–1.24)
GFR calc Af Amer: 6 mL/min — ABNORMAL LOW (ref >60)
GFR calc Af Amer: 6 mL/min — ABNORMAL LOW (ref >60)
GFR calc Af Amer: 6 mL/min — ABNORMAL LOW (ref >60)
GFR calc non Af Amer: 5 mL/min — ABNORMAL LOW (ref >60)
GFR calc non Af Amer: 5 mL/min — ABNORMAL LOW (ref >60)
GFR, EST AFRICAN AMERICAN: 6 mL/min — AB (ref >60)
GFR, EST AFRICAN AMERICAN: 6 mL/min — AB (ref >60)
GFR, EST NON AFRICAN AMERICAN: 5 mL/min — AB (ref >60)
GFR, EST NON AFRICAN AMERICAN: 5 mL/min — AB (ref >60)
GFR, EST NON AFRICAN AMERICAN: 5 mL/min — AB (ref >60)
Glucose, Bld: 141 mg/dL — ABNORMAL HIGH (ref 65–99)
Glucose, Bld: 109 mg/dL — ABNORMAL HIGH (ref 65–99)
Glucose, Bld: 97 mg/dL (ref 65–99)
Glucose, Bld: 85 mg/dL (ref 65–99)
Glucose, Bld: 82 mg/dL (ref 65–99)
POTASSIUM: 4 mmol/L (ref 3.5–5.1)
POTASSIUM: 3.6 mmol/L (ref 3.5–5.1)
Potassium: 3.5 mmol/L (ref 3.5–5.1)
Potassium: 3.4 mmol/L — ABNORMAL LOW (ref 3.5–5.1)
Potassium: 3.6 mmol/L (ref 3.5–5.1)
Sodium: 136 mmol/L (ref 135–145)
Sodium: 138 mmol/L (ref 135–145)
Sodium: 140 mmol/L (ref 135–145)
Sodium: 140 mmol/L (ref 135–145)
Sodium: 138 mmol/L (ref 135–145)

## 2014-08-22 LAB — BLOOD GAS, ARTERIAL
Acid-base deficit: 7.4 mmol/L — ABNORMAL HIGH (ref 0.0–2.0)
Acid-base deficit: 6.6 mmol/L — ABNORMAL HIGH (ref 0.0–2.0)
Bicarbonate: 15.1 mEq/L — ABNORMAL LOW (ref 20.0–24.0)
Bicarbonate: 16.6 mEq/L — ABNORMAL LOW (ref 20.0–24.0)
DRAWN BY: 345601
Drawn by: 301361
FIO2: 0.4
FIO2: 0.3
MECHVT: 620 mL
O2 SAT: 99.2 %
O2 Saturation: 99.3 %
PEEP/CPAP: 5 cmH2O
PEEP: 5 cmH2O
PH ART: 7.522 — AB (ref 7.350–7.450)
PH ART: 7.454 — AB (ref 7.350–7.450)
Patient temperature: 98.6
Patient temperature: 98.6
RATE: 16 resp/min
RATE: 12 resp/min
TCO2: 15.6 mmol/L (ref 0–100)
TCO2: 17.4 mmol/L (ref 0–100)
VT: 620 mL
pCO2 arterial: 18.5 mmHg — CL (ref 35.0–45.0)
pCO2 arterial: 24.1 mmHg — ABNORMAL LOW (ref 35.0–45.0)
pO2, Arterial: 210 mmHg — ABNORMAL HIGH (ref 80.0–100.0)
pO2, Arterial: 172 mmHg — ABNORMAL HIGH (ref 80.0–100.0)

## 2014-08-22 LAB — APTT: aPTT: 35 seconds (ref 24–37)

## 2014-08-22 LAB — PROTIME-INR
INR: 1.29 (ref 0.00–1.49)
Prothrombin Time: 16.2 seconds — ABNORMAL HIGH (ref 11.6–15.2)

## 2014-08-22 LAB — GRAM STAIN

## 2014-08-22 LAB — CG4 I-STAT (LACTIC ACID): Lactic Acid, Venous: 1.51 mmol/L (ref 0.5–2.0)

## 2014-08-22 LAB — TROPONIN I: TROPONIN I: 0.15 ng/mL — AB (ref <0.031)

## 2014-08-22 LAB — MAGNESIUM: Magnesium: 1.6 mg/dL — ABNORMAL LOW (ref 1.7–2.4)

## 2014-08-22 MED ORDER — DEXTROSE 50 % IV SOLN
25.0000 mL | Freq: Once | INTRAVENOUS | Status: AC
  Administered 2014-08-22: 25 mL via INTRAVENOUS

## 2014-08-22 MED ORDER — POTASSIUM CHLORIDE 10 MEQ/50ML IV SOLN
10.0000 meq | INTRAVENOUS | Status: AC
  Administered 2014-08-22 (×2): 10 meq via INTRAVENOUS
  Filled 2014-08-22 (×2): qty 50

## 2014-08-22 MED ORDER — PERFLUTREN LIPID MICROSPHERE
1.0000 mL | INTRAVENOUS | Status: AC | PRN
  Administered 2014-08-22: 2 mL via INTRAVENOUS
  Filled 2014-08-22: qty 10

## 2014-08-22 MED ORDER — SODIUM CHLORIDE 0.9 % IV SOLN: 1500.0000 mg | INTRAVENOUS | Status: DC

## 2014-08-22 MED ORDER — DEXTROSE 50 % IV SOLN
INTRAVENOUS | Status: AC
  Filled 2014-08-22: qty 50

## 2014-08-22 MED ORDER — VANCOMYCIN HCL 10 G IV SOLR
1750.0000 mg | Freq: Once | INTRAVENOUS | Status: AC
  Administered 2014-08-22: 1750 mg via INTRAVENOUS
  Filled 2014-08-22: qty 1750

## 2014-08-22 MED ORDER — PERFLUTREN LIPID MICROSPHERE
INTRAVENOUS | Status: AC
  Filled 2014-08-22: qty 10

## 2014-08-22 MED ORDER — SODIUM CHLORIDE 0.9 % IV SOLN
1.0000 g | Freq: Once | INTRAVENOUS | Status: AC
  Administered 2014-08-22: 1 g via INTRAVENOUS
  Filled 2014-08-22: qty 10

## 2014-08-22 NOTE — Progress Notes (Signed)
SUBJECTIVE:  During dialysis catheter placement yesterday, patient became bradycardic and hypotensive. He had a PEA arrest. He is now on the [facility] protocol and was transferred to the ICU.  OBJECTIVE:   Vitals:   Filed Vitals:   08/22/14 0745 08/22/14 0800 08/22/14 0815 08/22/14 0830  BP:  105/73    Pulse: 51 48 58 56  Temp:  91.6 F (33.1 C)    TempSrc:  Core (Comment)    Resp:      Height:      Weight:      SpO2: 100% 100% 99% 100%   I&O's:   Intake/Output Summary (Last 24 hours) at 08/22/14 0857 Last data filed at 08/22/14 0800  Gross per 24 hour  Intake 1418.16 ml  Output    725 ml  Net 693.16 ml   TELEMETRY: Reviewed telemetry pt in sinus bradycardia:     PHYSICAL EXAM General: Well developed, well nourished, in no acute distress Head:   Normal cephalic and atramatic  Lungs:  Coarse breath sounds bilaterally to auscultation. Heart:   HRRR S1 S2  No JVD.   Abdomen: abdomen soft and non-tender Msk:  Back normal,  Normal strength and tone for age. Extremities:  Trace bilateral lower extremity edema.   Neuro: Alert and oriented. Psych:  Normal affect, responds appropriately Skin: No rash   LABS: Basic Metabolic Panel:  Recent Labs  08/21/14 2050  08/22/14 0115 08/22/14 0445  NA 139  < > 136 138  K 3.2*  < > 3.5 3.5  CL 106  < > 106 106  CO2 17*  < > 15* 15*  GLUCOSE 120*  < > 141* 135*  BUN 94*  < > 97* 97*  CREATININE 10.29*  < > 10.31* 10.24*  CALCIUM 6.5*  < > 6.4* 6.5*  MG 1.6*  --   --  1.6*  PHOS 5.3*  --   --  5.4*  < > = values in this interval not displayed. Liver Function Tests:  Recent Labs  08/21/14 0802 08/21/14 1700 08/22/14 0445  AST 24 31  --   ALT 38 40  --   ALKPHOS 130* 116  --   BILITOT 0.5 0.6  --   PROT 5.5* 5.1*  --   ALBUMIN 1.9* 1.8* 1.5*   No results for input(s): LIPASE, AMYLASE in the last 72 hours. CBC:  Recent Labs  08/20/14 2058  08/21/14 1700 08/22/14 0445  WBC 6.8  < > 7.2 8.4  NEUTROABS 5.1   --  6.4  --   HGB 10.6*  < > 9.5* 12.5*  HCT 32.3*  < > 28.3* 36.5*  MCV 79.8  < > 79.9 78.0  PLT 310  < > 276 329  < > = values in this interval not displayed. Cardiac Enzymes:  Recent Labs  08/21/14 1700 08/21/14 2050 08/22/14 0445  TROPONINI 0.06* 0.17* 0.15*   BNP: Invalid input(s): POCBNP D-Dimer: No results for input(s): DDIMER in the last 72 hours. Hemoglobin A1C:  Recent Labs  08/20/14 2058  HGBA1C 7.6*   Fasting Lipid Panel: No results for input(s): CHOL, HDL, LDLCALC, TRIG, CHOLHDL, LDLDIRECT in the last 72 hours. Thyroid Function Tests:  Recent Labs  08/20/14 2058  TSH 2.329   Anemia Panel: No results for input(s): VITAMINB12, FOLATE, FERRITIN, TIBC, IRON, RETICCTPCT in the last 72 hours. Coag Panel:   Lab Results  Component Value Date   INR 1.29 08/22/2014   INR 1.30 08/21/2014   INR  1.28 08/20/2014    RADIOLOGY: Dg Chest 2 View  08/20/2014   CLINICAL DATA:  Lower extremity edema, CHF and anasarca. History of renal failure.  EXAM: CHEST  2 VIEW  COMPARISON:  01/14/2014 and 06/20/2008 chest radiographs  FINDINGS: Small-moderate bilateral pleural effusions and bibasilar atelectasis again noted.  Mild pulmonary vascular congestion is present.  The visualized cardiomediastinal silhouette is unchanged.  There is no evidence of pneumothorax.  No acute bony abnormalities are identified.  IMPRESSION: Small-moderate bilateral pleural effusions with bibasilar atelectasis.  Mild pulmonary vascular congestion.   Electronically Signed   By: Margarette Canada M.D.   On: 08/20/2014 15:01   Dg Chest Port 1 View  08/21/2014   CLINICAL DATA:  Hypoxia  EXAM: PORTABLE CHEST - 1 VIEW  COMPARISON:  August 20, 2014  FINDINGS: Endotracheal tube tip is 2.9 cm above the carina. Left jugular catheter tip is in the superior vena cava just beyond the junction with the left innominate vein. Dual-lumen catheter tip is in the right atrium. Nasogastric tube tip and side port are below the  diaphragm. No pneumothorax. There is cardiomegaly with bilateral effusions and alveolar edema bilaterally. No adenopathy.  IMPRESSION: Congestive heart failure. Tube and catheter positions as described without pneumothorax.   Electronically Signed   By: Lowella Grip III M.D.   On: 08/21/2014 18:41   Dg Abd Portable 1v  08/21/2014   CLINICAL DATA:  OG tube placement  EXAM: PORTABLE ABDOMEN - 1 VIEW  COMPARISON:  None.  FINDINGS: NG tube with tip in the gastric body. Paucity of gas in the abdomen. Bilateral pleural effusions.  IMPRESSION: NG tube with tip in the stomach.   Electronically Signed   By: Suzy Bouchard M.D.   On: 08/21/2014 18:42   Dg Fluoro Guide Cv Line-no Report  08/21/2014   CLINICAL DATA:    FLOURO GUIDE CV LINE  Fluoroscopy was utilized by the requesting physician.  No radiographic  interpretation.       ASSESSMENT: Kathyrn Lass:  Unclear etiology of cardiac arrest. Patient with known left ventricular dysfunction. Further plans will be based on mental status recovery. Continue supportive care. Rewarming process to start at 8 PM tonight.  He is on low-dose pressors at this time. Blood pressure should improve as he is rewarmed.  Jettie Booze, MD  08/22/2014  8:57 AM

## 2014-08-22 NOTE — Progress Notes (Signed)
eLink Physician-Brief Progress Note Patient Name: Kyle [name] DOB: 02/14/1955 MRN: [id number]   Date of Service  08/22/2014  HPI/Events of Note  Paracentesis fluid is positive for GPC in clusters.   eICU Interventions  Will order Vancomycin per pharmacy.     Intervention Category Major Interventions: Infection - evaluation and management  Carmelita Amparo Eugene 08/22/2014, 6:12 PM

## 2014-08-22 NOTE — Progress Notes (Signed)
CRITICAL VALUE ALERT  Critical value received:  Ca 6.3  Date of notification:  08/21/14  Time of notification:  2000  Critical value read back:Yes.    Nurse who received alert:  Verlin Grills  MD notified (1st page):  Elink  Time of first page:  2000   Also reported K of 3.5. Informed Elink that pt's HR is in the mid to high 40's. MD ordered switch from propofol to versed. Will continue to monitor pt.

## 2014-08-22 NOTE — Progress Notes (Signed)
CRITICAL VALUE ALERT  Critical value received:  Gram Positive Cocci in Clusters (anaerobic bottle only) found in body fluid from Paracentesis 7/27  Date of notification:  7/28  Time of notification:  1706   Critical value read back:Yes.    Nurse who received alert:  Dennison Mascot  MD notified (1st page):  ELINK  Time of first page:  1707  Responding MD:  Warren Lacy  Time MD responded:  [phone]

## 2014-08-22 NOTE — Progress Notes (Signed)
Hypoglycemic Event  CBG: 68  Treatment: D50 IV 25 mL  Symptoms: None  Follow-up CBG: Time: 2219 CBG Result: 114  Possible Reasons for Event: Inadequate meal intake    Kyle [name]

## 2014-08-22 NOTE — Anesthesia Postprocedure Evaluation (Signed)
  Anesthesia Post-op Note  Patient: Kyle [name]  Procedure(s) Performed: Procedure(s) with comments: INSERTION OF DIALYSIS CATHETER RIGHT INTERNAL JUGULAR VEIN (N/A) - MAC to General  Patient Location: SICU  Anesthesia Type:MAC  Level of Consciousness: sedated and unresponsive  Airway and Oxygen Therapy: Patient placed on Ventilator (see vital sign flow sheet for setting)  Post-op Pain: none  Post-op Assessment: Post-op Vital signs reviewed, PATIENT'S CARDIOVASCULAR STATUS UNSTABLE and RESPIRATORY FUNCTION UNSTABLE              Post-op Vital Signs: stable and unstable  Last Vitals:  Filed Vitals:   08/22/14 1100  BP: 100/77  Pulse: 49  Temp: 32.8 C  Resp:     Complications: cardiac arrest during procedure , events noted in chart. remains intubated at this time

## 2014-08-22 NOTE — Progress Notes (Signed)
   Daily Progress Note  Assessment/Planning: POD #1 s/p RIJ TDC placement, s/p PEA   CXR and labs reviewed.  No evidence of mechanical complications from [facility] placement.  Pt's bradycardiac developed after J-wire removal, so I don't think the arrhythmia was due to wire disturbance.  Called wife with update on events. Reportedly, limited response so far to stimulation  Will defer any permanent access until medical stable  Subjective  - 1 Day Post-Op  sedated  Objective Filed Vitals:   08/22/14 0900  BP:   Pulse: 51  Temp: 91.4 F (33 C)  Resp:     Intake/Output Summary (Last 24 hours) at 08/22/14 0947 Last data filed at 08/22/14 0900  Gross per 24 hour  Intake 1466.62 ml  Output    725 ml  Net 741.62 ml    PULM  On vent, mechanical vent sounds, no bleeding on bandage covering TDC exit site, pcxr: large B pleural effusion, TDC in RA, no PTX CV  Bradycardiac, no m/g  Laboratory CBC    Component Value Date/Time   WBC 8.4 08/22/2014 0445   HGB 12.5* 08/22/2014 0445   HCT 36.5* 08/22/2014 0445   PLT 329 08/22/2014 0445    BMET    Component Value Date/Time   NA 138 08/22/2014 0845   K 3.4* 08/22/2014 0845   CL 109 08/22/2014 0845   CO2 17* 08/22/2014 0845   GLUCOSE 109* 08/22/2014 0845   BUN 96* 08/22/2014 0845   CREATININE 10.12* 08/22/2014 0845   CALCIUM 6.4* 08/22/2014 0845   GFRNONAA 5* 08/22/2014 0845   GFRAA 6* 08/22/2014 0845    Adele Barthel, MD [facility]: [phone] Pager: [phone]  08/22/2014, 9:47 AM

## 2014-08-22 NOTE — Progress Notes (Signed)
CRITICAL VALUE ALERT  Critical value received:  Ca 6.4  Date of notification:  08/22/14  Time of notification:  0151  Critical value read back:Yes.    Nurse who received alert:  Verlin Grills  MD notified (1st page):  Elink  Time of first page:  [phone]  Also informed that K is 3.5.

## 2014-08-22 NOTE — Progress Notes (Signed)
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  Pt was tx to [facility] for placement of tunneled HD cath for initiation of dialysis.  He went to OR 7/27 for placement of Tunneled cath (concious sedation only) and intra-operatively (post catheter placement) had bradycardia and ultimately PEA arrest with approx 10 mins CPR before ROSC.  No purposeful mental status post arrest. Tx to ICU and PCCM consulted.   STUDIES:    SIGNIFICANT EVENTS: 7/27 Cardiac arrest, intubated and to ICU, hypothermia  HISTORY OF PRESENT ILLNESS:   59 yo male with PMHx of CKD Stage V, chronic systolic and diastolic CHF (EF 0000000), HTN and T2DM who presented to [facility] ED on 08/20/14 with complaints of increasing shortness of breath, fatigue, and scrotal and lower extremity edema and abdominal distention. Patient was found to be in acute on chronic heart failure exacerbation and acute on chronic renal failure. Creatinine on admission was 10.77 with GFR of 5. Baseline creatinine is unknown, but last creatinine during hospitalization for CHF exacerbation in 12/2013 was around 5.5-6 and GFR baseline of 12. Patient never followed up with nephrology. He was seen by nephrology and to be started on HD pending tunneled cath placement. He went to OR 7/27 for placement of Tunneled cath (concious sedation only) and intra-operatively (post catheter placement) had bradycardia and ultimately PEA arrest with approx 10 mins CPR before ROSC.  No purposeful mental status post arrest. Tx to ICU and PCCM consulted.  SUBJECTIVE: No events overnight  VITAL SIGNS: Temp:  [89.2  F (31.8 C)-95 F (35 C)] 91 F (32.8 C) (07/28 1000) Pulse Rate:  [43-71] 49 (07/28 1015) Resp:  [10-16] 12 (07/28 0955) BP: (93-157)/(67-106) 123/82 mmHg (07/28 0955) SpO2:  [98 %-100 %] 99 % (07/28 1015) Arterial Line BP: (139-156)/(79-91) 156/91 mmHg (07/27 1700) FiO2 (%):  [30 %-60 %] 30 % (07/28 0955) Weight:  [105.6 kg (232 lb 12.9 oz)] 105.6 kg (232 lb 12.9 oz) (07/28 0500)   HEMODYNAMICS: CVP:  [4 mmHg-12 mmHg] 9 mmHg  VENTILATOR SETTINGS: Vent Mode:  [-] PRVC FiO2 (%):  [30 %-60 %] 30 % Set Rate:  [12 bmp-16 bmp] 12 bmp Vt Set:  [id number] mL] 620 mL PEEP:  [5 cmH20] 5 cmH20 Plateau Pressure:  [20 cmH20-27 cmH20] 21 cmH20 INTAKE / OUTPUT:  Intake/Output Summary (Last 24 hours) at 08/22/14 1036 Last data filed at 08/22/14 1000  Gross per 24 hour  Intake 1506.79 ml  Output    725 ml  Net 781.79 ml   PHYSICAL EXAMINATION: General:  Chronically ill appearing male, acutely ill post cardiac arrest. Neuro:  Sedated and paralyzed on hypothermia protocol. HEENT:  Mm moist, ETT, mild JVD. Cardiovascular:  s1s2 rrr, R [place] tunneled cath c/d. Lungs:  resps even non labored on vent, diminished bases. Abdomen:  Soft, NT, ND and +BS. Musculoskeletal:  Warm and dry, scant BLE edema.  LABS:  CBC  Recent Labs Lab 08/21/14 0802 08/21/14 1700 08/22/14 0445  WBC 6.7 7.2 8.4  HGB 9.9* 9.5* 12.5*  HCT 30.1* 28.3* 36.5*  PLT 287  [place] Lab 08/20/14 2058 08/21/14 1700 08/22/14 0115  APTT 36 34 35  INR 1.28 1.30 1.29   BMET  Recent Labs Lab 08/22/14 0115 08/22/14 0445 08/22/14 0845  NA 136 138 138  K 3.5 3.5 3.4*  CL 106 106 109  CO2 15* 15* 17*  BUN 97* 97* 96*  CREATININE 10.31* 10.24* 10.12*  GLUCOSE 141* 135* 109*   Electrolytes  Recent Labs Lab 08/20/14 2058  08/21/14 2050  08/22/14 0115 08/22/14 0445 08/22/14 0845  CALCIUM 6.8*  < > 6.5*  < > 6.4* 6.5* 6.4*  MG 1.6*  --  1.6*  --   --  1.6*  --   PHOS 5.6*  --  5.3*  --    --  5.4*  --   < > = values in this interval not displayed. Sepsis Markers  Recent Labs Lab 08/21/14 1656 08/21/14 1700 08/21/14 2050  LATICACIDVEN 1.51 1.7 1.0   ABG  Recent Labs Lab 08/21/14 1652 08/22/14 0600 08/22/14 0850  PHART 7.414 7.522* 7.454*  PCO2ART 31.2* 18.5* 24.1*  PO2ART 219.0* 210* 172*   Liver Enzymes  Recent Labs Lab 08/20/14 2058 08/21/14 0802 08/21/14 1700 08/22/14 0445  AST 33 24 31  --   ALT 47 38 40  --   ALKPHOS 150* 130* 116  --   BILITOT 0.7 0.5 0.6  --   ALBUMIN 2.1* 1.9* 1.8* 1.5*   Cardiac Enzymes  Recent Labs Lab 08/21/14 1700 08/21/14 2050 08/22/14 0445  TROPONINI 0.06* 0.17* 0.15*   Glucose  Recent Labs Lab 08/22/14 0110 08/22/14 0203 08/22/14 0403 08/22/14 0553 08/22/14 0729 08/22/14 0950  GLUCAP 123* 124* 134* 117* 104* 108*    Imaging Dg Chest Port 1 View  08/21/2014   CLINICAL DATA:  Hypoxia  EXAM: PORTABLE CHEST - 1 VIEW  COMPARISON:  August 20, 2014  FINDINGS: Endotracheal tube tip is 2.9 cm above the carina. Left jugular catheter tip is in the superior vena cava just beyond the junction with the left innominate vein. Dual-lumen catheter tip is in the right atrium. Nasogastric tube tip and side port are below the diaphragm. No pneumothorax. There is cardiomegaly with bilateral effusions and alveolar edema bilaterally. No adenopathy.  IMPRESSION: Congestive heart failure. Tube and catheter positions as described without pneumothorax.   Electronically Signed   By: Lowella Grip III M.D.   On: 08/21/2014 18:41   Dg Abd Portable 1v  08/21/2014   CLINICAL DATA:  OG tube placement  EXAM: PORTABLE ABDOMEN - 1 VIEW  COMPARISON:  None.  FINDINGS: NG tube with tip in the gastric body. Paucity of gas in the abdomen. Bilateral pleural effusions.  IMPRESSION: NG tube with tip in the stomach.   Electronically Signed   By: Suzy Bouchard M.D.   On: 08/21/2014 18:42   Dg Fluoro Guide Cv Line-no Report  08/21/2014   CLINICAL  DATA:    FLOURO GUIDE CV LINE  Fluoroscopy was utilized by the requesting physician.  No radiographic  interpretation.      ASSESSMENT / PLAN:  PULMONARY OETT 7/27>>> Acute respiratory failure - post cardiac arrest Volume overload  P:   Vent support - 8cc/kg  F/u CXR and ABG in AM Decrease RR to 10  CARDIOVASCULAR R  tunneled cath 7/27>>> L IJ CVL 7/27>>> PEA arrest - unclear etiology. Not hyperkalemic. Hx does not support PE.  Also had paracentesis 7/27, ?? Poor cardiac status did not tol fluid shifts  although remains hypertensive.  Acute on chronic combined CHF - EF 15% anasarca  HTN  P:  F/u troponin  Hypothermia protocol in progress Cards consulted Hold Beta blockers for now D/C lasix Renal to see as below for ?volume removal but will defer til family discussion Echo done but results pending  RENAL Acute on CKD V - likely now ESRD  P:   BMET in AM Renal following q4 BMET per hypothermia protocol  Hold off CRRT til after family discussion Replace electrolytes as indicated.  GASTROINTESTINAL Ascites  P:   PPI  TF after paralytics are off.  HEMATOLOGIC Anemia of chronic disease  P:  CBC in AM Transfuse per ICU protocol SQ heparin   INFECTIOUS No active issue  P:   UC 7/27  Monitor WBC off abx - received peri-op ancef   ENDOCRINE DM  P:   SSI   NEUROLOGIC AMS - no purposeful movement post arrest  P:   RASS goal: -5  hypothermia protocol  FAMILY  - Updates:  No family available this AM.   - Inter-disciplinary family meet or Palliative Care meeting due by:  day 7  The patient is critically ill with multiple organ systems failure and requires high complexity decision making for assessment and support, frequent evaluation and titration of therapies, application of advanced monitoring technologies and extensive interpretation of multiple databases.   Critical Care Time devoted to patient care services described in this note is  35  Minutes.  This time reflects time of care of this signee Dr Jennet Maduro. This critical care time does not reflect procedure time, or teaching time or supervisory time of PA/NP/Med student/Med Resident etc but could involve care discussion time.  Rush Farmer, M.D. [facility] Pulmonary/Critical Care Medicine. Pager: [phone]. After hours pager: [phone].

## 2014-08-22 NOTE — Progress Notes (Signed)
eLink Physician-Brief Progress Note Patient Name: Kyle [name] DOB: May 05, 1955 MRN: [id number]   Date of Service  08/22/2014  HPI/Events of Note  Respiratory alkalosis.  eICU Interventions  Will decrease RR to 12 and f/u ABG.     Intervention Category Major Interventions: Other:  Gladiola Madore 08/22/2014, 6:24 AM

## 2014-08-22 NOTE — Progress Notes (Signed)
CRITICAL VALUE ALERT  Critical value received:  Calcium 6.4  Date of notification:  08/22/14  Time of notification:  0942  Critical value read back:Yes.    Nurse who received alert:  Dennison Mascot  MD notified (1st page):  Dr. Nelda Marseille  Time of first page:  1000  Responding MD:  Dr. Nelda Marseille  Time MD responded:  1030, electrolytes replaced

## 2014-08-22 NOTE — Progress Notes (Signed)
Subjective: Kyle [name] is a 59 yo male with PMHx of CKD Stage V and chronic systolic and diastolic CHF (EF 0000000) who was admitted for acute on chronic kidney and heart failure. Patient was anasarcous on admission and went for paracentesis yesterday. He later went for placement of a tunneled HD catheter and developed bradycardia after placement and went into PEA arrest of unclear etiology. Patient was intubated, resuscitated and place on hypothermia protocol. Patient was seen and examined this morning. He is intubated and sedated.  Objective: Vital signs in last 24 hours: Filed Vitals:   08/22/14 1015 08/22/14 1030 08/22/14 1045 08/22/14 1100  BP:    100/77  Pulse: 49 50 51 49  Temp:    91 F (32.8 C)  TempSrc:    Core (Comment)  Resp:      Height:      Weight:      SpO2: 99% 100% 100% 100%   General: Vital signs reviewed. Patient is intubated, sedated.  HEENT: Intubated Cardiovascular: RRR, S1 normal, S2 normal Pulmonary/Chest: Mild inspiratory crackles in lower lung fields, no wheezes, or rhonchi. Abdominal: Distended, hypoactive BS + Extremities: 2+ woody pitting lower extremity edema bilaterally  Skin: Warm, dry and intact. No rashes or erythema.  Intake/Output from previous day: 07/27 0701 - 07/28 0700 In: 1385.5 [I.V.:975.5; IV Piggyback:410] Out: 725 [Urine:725] Intake/Output this shift: Total I/O In: 121.3 [I.V.:121.3] Out: -   Lab Results:  Recent Labs  08/21/14 1700 08/22/14 0445  WBC 7.2 8.4  HGB 9.5* 12.5*  HCT 28.3* 36.5*  PLT 276 329   BMET:   Recent Labs  08/22/14 0445 08/22/14 0845  NA 138 138  K 3.5 3.4*  CL 106 109  CO2 15* 17*  GLUCOSE 135* 109*  BUN 97* 96*  CREATININE 10.24* 10.12*  CALCIUM 6.5* 6.4*   No results for input(s): PTH in the last 72 hours. Iron Studies: No results for input(s): IRON, TIBC, TRANSFERRIN, FERRITIN in the last 72 hours. CBG (last 3)   Recent Labs  08/22/14 0553 08/22/14 0729 08/22/14 0950  GLUCAP 117*  104* 108*     Studies/Results: Dg Chest 2 View  08/20/2014   CLINICAL DATA:  Lower extremity edema, CHF and anasarca. History of renal failure.  EXAM: CHEST  2 VIEW  COMPARISON:  01/14/2014 and 06/20/2008 chest radiographs  FINDINGS: Small-moderate bilateral pleural effusions and bibasilar atelectasis again noted.  Mild pulmonary vascular congestion is present.  The visualized cardiomediastinal silhouette is unchanged.  There is no evidence of pneumothorax.  No acute bony abnormalities are identified.  IMPRESSION: Small-moderate bilateral pleural effusions with bibasilar atelectasis.  Mild pulmonary vascular congestion.   Electronically Signed   By: Margarette Canada M.D.   On: 08/20/2014 15:01   US Paracentesis  08/22/2014   CLINICAL DATA:  Ascites secondary to congestive heart failure and acute on chronic kidney disease stage 5  EXAM: ULTRASOUND GUIDED LEFT LOWER QUADRANT PARACENTESIS  COMPARISON:  None.  PROCEDURE: An ultrasound guided paracentesis was thoroughly discussed with the patient and questions answered. The benefits, risks, alternatives and complications were also discussed. The patient understands and wishes to proceed with the procedure. Written consent was obtained.  Ultrasound was performed to localize and mark an adequate pocket of fluid in the left lower quadrant of the abdomen. The area was then prepped and draped in the normal sterile fashion. 1% Lidocaine was used for local anesthesia. Under ultrasound guidance a 19 gauge Yueh catheter was introduced. Paracentesis was performed. The catheter was  removed and a dressing applied.  COMPLICATIONS: None.  FINDINGS: A total of approximately 2.5 liters of clear yellow fluid was removed. A fluid sample was sent for laboratory analysis.  IMPRESSION: Successful ultrasound guided paracentesis yielding 2.5 liters of ascites.  Read by:  Gareth Eagle, PA-C   Electronically Signed   By: Sandi Mariscal M.D.   On: 08/21/2014 14:24   Dg Chest Port 1  View  08/21/2014   CLINICAL DATA:  Hypoxia  EXAM: PORTABLE CHEST - 1 VIEW  COMPARISON:  August 20, 2014  FINDINGS: Endotracheal tube tip is 2.9 cm above the carina. Left jugular catheter tip is in the superior vena cava just beyond the junction with the left innominate vein. Dual-lumen catheter tip is in the right atrium. Nasogastric tube tip and side port are below the diaphragm. No pneumothorax. There is cardiomegaly with bilateral effusions and alveolar edema bilaterally. No adenopathy.  IMPRESSION: Congestive heart failure. Tube and catheter positions as described without pneumothorax.   Electronically Signed   By: Lowella Grip III M.D.   On: 08/21/2014 18:41   Dg Abd Portable 1v  08/21/2014   CLINICAL DATA:  OG tube placement  EXAM: PORTABLE ABDOMEN - 1 VIEW  COMPARISON:  None.  FINDINGS: NG tube with tip in the gastric body. Paucity of gas in the abdomen. Bilateral pleural effusions.  IMPRESSION: NG tube with tip in the stomach.   Electronically Signed   By: Suzy Bouchard M.D.   On: 08/21/2014 18:42   Dg Fluoro Guide Cv Line-no Report  08/21/2014   CLINICAL DATA:    FLOURO GUIDE CV LINE  Fluoroscopy was utilized by the requesting physician.  No radiographic  interpretation.     I have reviewed the patient's current medications. Prior to Admission:  Prescriptions prior to admission  Medication Sig Dispense Refill Last Dose  . aspirin 81 MG chewable tablet Chew 1 tablet (81 mg total) by mouth daily.   08/19/2014 at Unknown time  . calcitRIOL (ROCALTROL) 0.25 MCG capsule Take 1 capsule (0.25 mcg total) by mouth every Monday, Wednesday, and Friday. 30 capsule 1 08/19/2014 at Unknown time  . carvedilol (COREG) 12.5 MG tablet Take 0.5 tablets (6.25 mg total) by mouth 2 (two) times daily with a meal. 60 tablet 1 08/20/2014 at 1000  . ferrous sulfate 325 (65 FE) MG tablet Take 1 tablet (325 mg total) by mouth daily with breakfast. 30 tablet 3 08/20/2014 at Unknown time  . furosemide (LASIX) 80 MG  tablet Take 1 tablet (80 mg total) by mouth 3 (three) times daily. 90 tablet 1 08/20/2014 at Unknown time  . glimepiride (AMARYL) 2 MG tablet Take 1 tablet (2 mg total) by mouth daily with breakfast. 30 tablet 1 08/20/2014 at Unknown time  . hydrALAZINE (APRESOLINE) 25 MG tablet Take 1.5 tablets (37.5 mg total) by mouth 3 (three) times daily. (Patient not taking: Reported on 08/20/2014) 90 tablet 0 Not Taking at Unknown time  . isosorbide dinitrate (ISORDIL) 20 MG tablet Take 1 tablet (20 mg total) by mouth 3 (three) times daily. (Patient not taking: Reported on 08/20/2014) 90 tablet 0 Not Taking at Unknown time  . isosorbide-hydrALAZINE (BIDIL) 20-37.5 MG per tablet Take 1 tablet by mouth 3 (three) times daily. (Patient not taking: Reported on 08/20/2014) 90 tablet 1 Not Taking at Unknown time   Scheduled: . antiseptic oral rinse  7 mL Mouth Rinse QID  . artificial tears  1 application Both Eyes 3 times per day  . chlorhexidine  15 mL Mouth Rinse BID  . fentaNYL (SUBLIMAZE) injection  100 mcg Intravenous Once  . heparin  5,000 Units Subcutaneous 3 times per day  . insulin aspart  0-20 Units Subcutaneous 6 times per day  . metoprolol  5 mg Intravenous 4 times per day  . pantoprazole (PROTONIX) IV  40 mg Intravenous QHS  . potassium chloride  10 mEq Intravenous Q1 Hr x 2  . sodium chloride  3 mL Intravenous Q12H   Continuous: . sodium chloride    . cisatracurium (NIMBEX) infusion 1 mcg/kg/min (08/22/14 0700)  . fentaNYL infusion INTRAVENOUS 250 mcg/hr (08/22/14 0739)  . midazolam (VERSED) infusion 2 mg/hr (08/22/14 0700)  . norepinephrine (LEVOPHED) Adult infusion 1.5 mcg/min (08/22/14 1000)  . propofol (DIPRIVAN) infusion Stopped (08/21/14 2213)   PRN:[COMPLETED] cisatracurium **AND** cisatracurium (NIMBEX) infusion **AND** cisatracurium, fentaNYL, [DISCONTINUED] ondansetron **OR** ondansetron (ZOFRAN) IV  Assessment/Plan: Acute on Chronic Kidney Disease, Stage V: Likely ESRD. Creatinine >10  on admission and GFR of 5. Patient had tunneled HD cath placed yesterday prior to PEA arrest. We will hold off on dialysis today as there are no absolute indications.  -Obtain AVF vs. AVG when medically stable -Repeat renal function panel tomorrow am -Strict I/Os  Hypokalemia: Potassium of 3.5 this morning.  -Repeat renal function panel tomorrow morning  HTN: Now hypotensive requiring low dose pressors.   Anemia of Chronic Disease: Likely secondary to CKD. Hemoglobin 9.9 today, at baseline of 10.  -Consider resuming ferrous sulfate tablet 325 mg QAM when able  Acute on Chronic Combine Systolic and Diastolic CHF: EF of 0000000. Repeat echo results pending.  -Per Cardiology   S/p PEA Arrest: Now on artic sun hypothermia protocol. Intubated and sedated. -Per Cardiology/PCCM  DVT/PE ppx: On Heparin TID    LOS: 2 days   Osa Craver, DO PGY-2 Internal Medicine Resident Pager # [phone] 08/22/2014 11:26 AM

## 2014-08-22 NOTE — Progress Notes (Signed)
  Echocardiogram 2D Echocardiogram with Definity has been performed.  Diamond Nickel 08/22/2014, 9:40 AM

## 2014-08-22 NOTE — Progress Notes (Signed)
[facility] Progress Note Patient Name: Kyle [name] DOB: 24-Aug-1955 MRN: [id number]   Date of Service  08/22/2014  HPI/Events of Note  Ca++ = 6.4 and albumin = 1.5. Ca++ corrected for albumin = 8.4.  eICU Interventions  Will cautiously replete Ca++.     Intervention Category Major Interventions: Electrolyte abnormality - evaluation and management  Sommer,Steven Cornelia Copa 08/22/2014, 9:49 PM

## 2014-08-22 NOTE — Progress Notes (Signed)
CRITICAL VALUE ALERT  Critical value received:  Ca 6.4  Date of notification:  08/22/14  Time of notification:  2142  Critical value read back:Yes.    Nurse who received alert:  Verlin Grills  MD notified (1st page):  Elink  Time of first page:  [phone]

## 2014-08-22 NOTE — Progress Notes (Signed)
Hypoglycemic Event  CBG: 60  Treatment: D50 IV 25 mL  Symptoms: None  Follow-up CBG: Time: 1838 CBG Result:111  Possible Reasons for Event: Inadequate meal intake  Comments: Will continue to monitor.    Dennison Mascot

## 2014-08-22 NOTE — Progress Notes (Signed)
ANTIBIOTIC CONSULT NOTE - INITIAL  Pharmacy Consult for Vancomycin Indication: GPC in clusters in paracentesis fluid  No Known Allergies  Patient Measurements: Height: 6' (182.9 cm) Weight: 232 lb 12.9 oz (105.6 kg) IBW/kg (Calculated) : 77.6 Adjusted Body Weight:   Vital Signs: Temp: 91 F (32.8 C) (07/28 1800) Temp Source: Core (Comment) (07/28 1800) BP: 99/83 mmHg (07/28 1648) Pulse Rate: 48 (07/28 1800) Intake/Output from previous day: 07/27 0701 - 07/28 0700 In: 1385.5 [I.V.:975.5; IV Piggyback:410] Out: 725 [Urine:725] Intake/Output from this shift: Total I/O In: 530.1 [I.V.:430.1; IV Piggyback:100] Out: -   Labs:  Recent Labs  08/21/14 0802 08/21/14 1700  08/22/14 0445 08/22/14 0845 08/22/14 1237 08/22/14 1700  WBC 6.7 7.2  --  8.4  --   --   --   HGB 9.9* 9.5*  --  12.5*  --   --   --   PLT 287 276  --  329  --   --   --   CREATININE 10.89* 10.89*  < > 10.24* 10.12* 10.33* 10.06*  < > = values in this interval not displayed. Estimated Creatinine Clearance: 9.9 mL/min (by C-G formula based on Cr of 10.06). No results for input(s): VANCOTROUGH, VANCOPEAK, VANCORANDOM, GENTTROUGH, GENTPEAK, GENTRANDOM, TOBRATROUGH, TOBRAPEAK, TOBRARND, AMIKACINPEAK, AMIKACINTROU, AMIKACIN in the last 72 hours.   Microbiology: Recent Results (from the past 720 hour(s))  Surgical pcr screen     Status: Abnormal   Collection Time: 08/21/14 11:55 AM  Result Value Ref Range Status   MRSA, PCR NEGATIVE NEGATIVE Final   Staphylococcus aureus POSITIVE (A) NEGATIVE Final    Comment:        The Xpert SA Assay (FDA approved for NASAL specimens in patients over 30 years of age), is one component of a comprehensive surveillance program.  Test performance has been validated by [facility] for patients greater than or equal to 33 year old. It is not intended to diagnose infection nor to guide or monitor treatment.   Culture, body fluid-bottle     Status: None (Preliminary  result)   Collection Time: 08/21/14 12:43 PM  Result Value Ref Range Status   Specimen Description FLUID PARACENTESIS  Final   Special Requests NONE  Final   Gram Stain   Final    GRAM POSITIVE COCCI IN CLUSTERS ANAEROBIC BOTTLE ONLY CRITICAL RESULT CALLED TO, READ BACK BY AND VERIFIED WITH: F ABERION 08/22/14 @ 71 M VESTAL    Culture GRAM POSITIVE COCCI  Final   Report Status PENDING  Incomplete  Gram stain     Status: None   Collection Time: 08/21/14 12:43 PM  Result Value Ref Range Status   Specimen Description FLUID PARACENTESIS  Final   Special Requests NONE  Final   Gram Stain   Final    FEW WBC PRESENT,BOTH PMN AND MONONUCLEAR NO ORGANISMS SEEN    Report Status 08/22/2014 FINAL  Final  MRSA PCR Screening     Status: None   Collection Time: 08/21/14  4:33 PM  Result Value Ref Range Status   MRSA by PCR NEGATIVE NEGATIVE Final    Comment:        The GeneXpert MRSA Assay (FDA approved for NASAL specimens only), is one component of a comprehensive MRSA colonization surveillance program. It is not intended to diagnose MRSA infection nor to guide or monitor treatment for MRSA infections.     Medical History: Past Medical History  Diagnosis Date  . Hypertension   . Diabetes mellitus without complication  Medications:  Prescriptions prior to admission  Medication Sig Dispense Refill Last Dose  . aspirin 81 MG chewable tablet Chew 1 tablet (81 mg total) by mouth daily.   08/19/2014 at Unknown time  . calcitRIOL (ROCALTROL) 0.25 MCG capsule Take 1 capsule (0.25 mcg total) by mouth every Monday, Wednesday, and Friday. 30 capsule 1 08/19/2014 at Unknown time  . carvedilol (COREG) 12.5 MG tablet Take 0.5 tablets (6.25 mg total) by mouth 2 (two) times daily with a meal. 60 tablet 1 08/20/2014 at 1000  . ferrous sulfate 325 (65 FE) MG tablet Take 1 tablet (325 mg total) by mouth daily with breakfast. 30 tablet 3 08/20/2014 at Unknown time  . furosemide (LASIX) 80 MG  tablet Take 1 tablet (80 mg total) by mouth 3 (three) times daily. 90 tablet 1 08/20/2014 at Unknown time  . glimepiride (AMARYL) 2 MG tablet Take 1 tablet (2 mg total) by mouth daily with breakfast. 30 tablet 1 08/20/2014 at Unknown time  . hydrALAZINE (APRESOLINE) 25 MG tablet Take 1.5 tablets (37.5 mg total) by mouth 3 (three) times daily. (Patient not taking: Reported on 08/20/2014) 90 tablet 0 Not Taking at Unknown time  . isosorbide dinitrate (ISORDIL) 20 MG tablet Take 1 tablet (20 mg total) by mouth 3 (three) times daily. (Patient not taking: Reported on 08/20/2014) 90 tablet 0 Not Taking at Unknown time  . isosorbide-hydrALAZINE (BIDIL) 20-37.5 MG per tablet Take 1 tablet by mouth 3 (three) times daily. (Patient not taking: Reported on 08/20/2014) 90 tablet 1 Not Taking at Unknown time   Scheduled:  . antiseptic oral rinse  7 mL Mouth Rinse QID  . artificial tears  1 application Both Eyes 3 times per day  . chlorhexidine  15 mL Mouth Rinse BID  . [COMPLETED] dextrose      . fentaNYL (SUBLIMAZE) injection  100 mcg Intravenous Once  . heparin  5,000 Units Subcutaneous 3 times per day  . insulin aspart  0-20 Units Subcutaneous 6 times per day  . metoprolol  5 mg Intravenous 4 times per day  . pantoprazole (PROTONIX) IV  40 mg Intravenous QHS  . sodium chloride  3 mL Intravenous Q12H  . vancomycin  1,750 mg Intravenous Once   Infusions:  . sodium chloride    . cisatracurium (NIMBEX) infusion 1 mcg/kg/min (08/22/14 0700)  . fentaNYL infusion INTRAVENOUS 250 mcg/hr (08/22/14 0739)  . midazolam (VERSED) infusion 2 mg/hr (08/22/14 1233)  . norepinephrine (LEVOPHED) Adult infusion 1 mcg/min (08/22/14 1826)  . propofol (DIPRIVAN) infusion Stopped (08/21/14 2213)   Assessment: 59yo male with history of CKD5 and recent placement of AVF presented as PEA arrest. Pharmacy is consulted to dose vancomycin for GPC in clusters in paracentesis fluid. Pt is being cooled and is hypothermic to 91, WBC 8.4,  sCr 10.6.  Goal of Therapy:  Vancomycin trough level 15-20 mcg/ml  Plan:  Vancomycin 1750mg  IV load followed by 1500mg  q48h Measure antibiotic drug levels at steady state Follow up culture results, renal function or HD plans, and clinical course  Andrey Cota. Diona Foley, PharmD Clinical Pharmacist Pager [phone] 08/22/2014,6:26 PM

## 2014-08-22 NOTE — Progress Notes (Signed)
CRITICAL VALUE ALERT  Critical value received:  ABG (ph 7.522, pCo2 18.5, bicarb 15.1)  Date of notification:  08/22/14  Time of notification:  0618  Critical value read back:Yes.    Nurse who received alert:  Verlin Grills  MD notified (1st page):  Elink  Time of first page:  [phone]

## 2014-08-22 NOTE — Progress Notes (Signed)
Called Elink to inform that K is 3.2, Mag is 1.6 and Phos is 5.3. MD ordered mag sulfate, calcium gluconate, and 2 runs of potassium. Will continue to monitor pt and labs.

## 2014-08-23 ENCOUNTER — Inpatient Hospital Stay (HOSPITAL_COMMUNITY): Payer: 59

## 2014-08-23 LAB — CBC
HCT: 37.8 % — ABNORMAL LOW (ref 39.0–52.0)
Hemoglobin: 12.8 g/dL — ABNORMAL LOW (ref 13.0–17.0)
MCH: 26.7 pg (ref 26.0–34.0)
MCHC: 33.9 g/dL (ref 30.0–36.0)
MCV: 78.9 fL (ref 78.0–100.0)
Platelets: 366 10*3/uL (ref 150–400)
RBC: 4.79 MIL/uL (ref 4.22–5.81)
RDW: 18.9 % — ABNORMAL HIGH (ref 11.5–15.5)
WBC: 10.1 10*3/uL (ref 4.0–10.5)

## 2014-08-23 LAB — BASIC METABOLIC PANEL
ANION GAP: 13 (ref 5–15)
ANION GAP: 14 (ref 5–15)
Anion gap: 14 (ref 5–15)
BUN: 94 mg/dL — ABNORMAL HIGH (ref 6–20)
BUN: 96 mg/dL — ABNORMAL HIGH (ref 6–20)
BUN: 96 mg/dL — AB (ref 6–20)
CALCIUM: 6.8 mg/dL — AB (ref 8.9–10.3)
CHLORIDE: 106 mmol/L (ref 101–111)
CO2: 18 mmol/L — ABNORMAL LOW (ref 22–32)
CO2: 17 mmol/L — ABNORMAL LOW (ref 22–32)
CO2: 19 mmol/L — AB (ref 22–32)
Calcium: 6.6 mg/dL — ABNORMAL LOW (ref 8.9–10.3)
Calcium: 6.8 mg/dL — ABNORMAL LOW (ref 8.9–10.3)
Chloride: 107 mmol/L (ref 101–111)
Chloride: 106 mmol/L (ref 101–111)
Creatinine, Ser: 10.07 mg/dL — ABNORMAL HIGH (ref 0.61–1.24)
Creatinine, Ser: 10.1 mg/dL — ABNORMAL HIGH (ref 0.61–1.24)
Creatinine, Ser: 10.13 mg/dL — ABNORMAL HIGH (ref 0.61–1.24)
GFR calc Af Amer: 6 mL/min — ABNORMAL LOW (ref >60)
GFR calc Af Amer: 6 mL/min — ABNORMAL LOW (ref >60)
GFR calc non Af Amer: 5 mL/min — ABNORMAL LOW (ref >60)
GFR calc non Af Amer: 5 mL/min — ABNORMAL LOW (ref >60)
GFR, EST AFRICAN AMERICAN: 6 mL/min — AB (ref >60)
GFR, EST NON AFRICAN AMERICAN: 5 mL/min — AB (ref >60)
GLUCOSE: 84 mg/dL (ref 65–99)
Glucose, Bld: 82 mg/dL (ref 65–99)
Glucose, Bld: 88 mg/dL (ref 65–99)
Potassium: 3.6 mmol/L (ref 3.5–5.1)
Potassium: 3.8 mmol/L (ref 3.5–5.1)
Potassium: 4 mmol/L (ref 3.5–5.1)
SODIUM: 138 mmol/L (ref 135–145)
Sodium: 137 mmol/L (ref 135–145)
Sodium: 139 mmol/L (ref 135–145)

## 2014-08-23 LAB — MAGNESIUM: Magnesium: 1.6 mg/dL — ABNORMAL LOW (ref 1.7–2.4)

## 2014-08-23 LAB — RENAL FUNCTION PANEL
ALBUMIN: 1.6 g/dL — AB (ref 3.5–5.0)
ANION GAP: 15 (ref 5–15)
ANION GAP: 14 (ref 5–15)
Albumin: 1.8 g/dL — ABNORMAL LOW (ref 3.5–5.0)
BUN: 96 mg/dL — ABNORMAL HIGH (ref 6–20)
BUN: 90 mg/dL — AB (ref 6–20)
CALCIUM: 6.9 mg/dL — AB (ref 8.9–10.3)
CO2: 17 mmol/L — ABNORMAL LOW (ref 22–32)
CO2: 18 mmol/L — ABNORMAL LOW (ref 22–32)
CREATININE: 10.12 mg/dL — AB (ref 0.61–1.24)
Calcium: 6.8 mg/dL — ABNORMAL LOW (ref 8.9–10.3)
Chloride: 106 mmol/L (ref 101–111)
Chloride: 107 mmol/L (ref 101–111)
Creatinine, Ser: 10.04 mg/dL — ABNORMAL HIGH (ref 0.61–1.24)
GFR calc Af Amer: 6 mL/min — ABNORMAL LOW (ref >60)
GFR calc Af Amer: 6 mL/min — ABNORMAL LOW (ref >60)
GFR calc non Af Amer: 5 mL/min — ABNORMAL LOW (ref >60)
GFR calc non Af Amer: 5 mL/min — ABNORMAL LOW (ref >60)
Glucose, Bld: 85 mg/dL (ref 65–99)
Glucose, Bld: 98 mg/dL (ref 65–99)
Phosphorus: 6.3 mg/dL — ABNORMAL HIGH (ref 2.5–4.6)
Phosphorus: 7 mg/dL — ABNORMAL HIGH (ref 2.5–4.6)
Potassium: 3.7 mmol/L (ref 3.5–5.1)
Potassium: 4.2 mmol/L (ref 3.5–5.1)
SODIUM: 138 mmol/L (ref 135–145)
SODIUM: 139 mmol/L (ref 135–145)

## 2014-08-23 LAB — POCT I-STAT 3, ART BLOOD GAS (G3+)
Acid-base deficit: 6 mmol/L — ABNORMAL HIGH (ref 0.0–2.0)
BICARBONATE: 17.3 meq/L — AB (ref 20.0–24.0)
O2 Saturation: 99 %
PCO2 ART: 25 mmHg — AB (ref 35.0–45.0)
Patient temperature: 35.3
TCO2: 18 mmol/L (ref 0–100)
pH, Arterial: 7.44 (ref 7.350–7.450)
pO2, Arterial: 123 mmHg — ABNORMAL HIGH (ref 80.0–100.0)

## 2014-08-23 LAB — GLUCOSE, CAPILLARY
GLUCOSE-CAPILLARY: 100 mg/dL — AB (ref 65–99)
GLUCOSE-CAPILLARY: 115 mg/dL — AB (ref 65–99)
GLUCOSE-CAPILLARY: 75 mg/dL (ref 65–99)
GLUCOSE-CAPILLARY: 75 mg/dL (ref 65–99)
Glucose-Capillary: 68 mg/dL (ref 65–99)
Glucose-Capillary: 95 mg/dL (ref 65–99)
Glucose-Capillary: 77 mg/dL (ref 65–99)
Glucose-Capillary: 89 mg/dL (ref 65–99)
Glucose-Capillary: 93 mg/dL (ref 65–99)
Glucose-Capillary: 79 mg/dL (ref 65–99)
Glucose-Capillary: 90 mg/dL (ref 65–99)

## 2014-08-23 LAB — PHOSPHORUS: PHOSPHORUS: 6.3 mg/dL — AB (ref 2.5–4.6)

## 2014-08-23 MED ORDER — VANCOMYCIN HCL IN DEXTROSE 1-5 GM/200ML-% IV SOLN
1000.0000 mg | INTRAVENOUS | Status: DC
  Filled 2014-08-23: qty 200

## 2014-08-23 MED ORDER — MIDAZOLAM HCL 2 MG/2ML IJ SOLN
INTRAMUSCULAR | Status: AC
  Filled 2014-08-23: qty 2

## 2014-08-23 MED ORDER — NEPRO/CARBSTEADY PO LIQD
1000.0000 mL | ORAL | Status: DC
  Administered 2014-08-23 – 2014-08-27 (×5): 1000 mL
  Filled 2014-08-23 (×7): qty 1000

## 2014-08-23 MED ORDER — PRISMASOL BGK 4/2.5 32-4-2.5 MEQ/L IV SOLN
INTRAVENOUS | Status: DC
  Filled 2014-08-23 (×2): qty 5000

## 2014-08-23 MED ORDER — HEPARIN SODIUM (PORCINE) 1000 UNIT/ML DIALYSIS
1000.0000 [IU] | INTRAMUSCULAR | Status: DC | PRN
  Administered 2014-08-23: 4600 [IU] via INTRAVENOUS_CENTRAL
  Administered 2014-08-24: 5000 [IU] via INTRAVENOUS_CENTRAL
  Administered 2014-08-24: 3000 [IU] via INTRAVENOUS_CENTRAL
  Administered 2014-08-28: 2200 [IU] via INTRAVENOUS_CENTRAL
  Administered 2014-08-28: 2400 [IU] via INTRAVENOUS_CENTRAL
  Filled 2014-08-23: qty 2
  Filled 2014-08-23 (×6): qty 6
  Filled 2014-08-23 (×2): qty 1
  Filled 2014-08-23: qty 3
  Filled 2014-08-23 (×2): qty 2

## 2014-08-23 MED ORDER — PANTOPRAZOLE SODIUM 40 MG PO PACK
40.0000 mg | PACK | ORAL | Status: DC
  Administered 2014-08-23 – 2014-08-27 (×5): 40 mg
  Filled 2014-08-23 (×6): qty 20

## 2014-08-23 MED ORDER — SODIUM CHLORIDE 0.9 % FOR CRRT
INTRAVENOUS_CENTRAL | Status: DC | PRN
  Administered 2014-08-27: 06:00:00 via INTRAVENOUS_CENTRAL
  Filled 2014-08-23 (×2): qty 1000

## 2014-08-23 MED ORDER — FENTANYL CITRATE (PF) 100 MCG/2ML IJ SOLN
100.0000 ug | INTRAMUSCULAR | Status: DC | PRN
Start: 2014-08-23 — End: 2014-08-23
  Administered 2014-08-23: 100 ug via INTRAVENOUS

## 2014-08-23 MED ORDER — PRISMASOL BGK 4/2.5 32-4-2.5 MEQ/L IV SOLN
INTRAVENOUS | Status: DC
  Administered 2014-08-23 – 2014-08-28 (×6): via INTRAVENOUS_CENTRAL
  Filled 2014-08-23 (×11): qty 5000

## 2014-08-23 MED ORDER — VITAL HIGH PROTEIN PO LIQD
1000.0000 mL | ORAL | Status: DC
  Administered 2014-08-23: 1000 mL
  Filled 2014-08-23 (×2): qty 1000

## 2014-08-23 MED ORDER — HYDRALAZINE HCL 20 MG/ML IJ SOLN
10.0000 mg | INTRAMUSCULAR | Status: DC | PRN
  Administered 2014-08-25 – 2014-08-29 (×6): 10 mg via INTRAVENOUS
  Filled 2014-08-23 (×6): qty 1

## 2014-08-23 MED ORDER — PRO-STAT SUGAR FREE PO LIQD
60.0000 mL | Freq: Every day | ORAL | Status: DC
  Administered 2014-08-23 – 2014-08-28 (×24): 60 mL
  Filled 2014-08-23 (×29): qty 60

## 2014-08-23 MED ORDER — PRISMASOL BGK 4/2.5 32-4-2.5 MEQ/L IV SOLN
INTRAVENOUS | Status: DC
  Administered 2014-08-23 – 2014-08-28 (×36): via INTRAVENOUS_CENTRAL
  Filled 2014-08-23 (×63): qty 5000

## 2014-08-23 MED ORDER — DEXTROSE 50 % IV SOLN
25.0000 mL | Freq: Once | INTRAVENOUS | Status: AC
  Administered 2014-08-23: 25 mL via INTRAVENOUS

## 2014-08-23 MED ORDER — METOPROLOL TARTRATE 25 MG/10 ML ORAL SUSPENSION
50.0000 mg | Freq: Two times a day (BID) | ORAL | Status: DC
  Administered 2014-08-23 – 2014-08-28 (×11): 50 mg
  Filled 2014-08-23 (×14): qty 20

## 2014-08-23 MED ORDER — VANCOMYCIN HCL IN DEXTROSE 1-5 GM/200ML-% IV SOLN
1000.0000 mg | INTRAVENOUS | Status: DC
  Administered 2014-08-23: 1000 mg via INTRAVENOUS
  Filled 2014-08-23 (×2): qty 200

## 2014-08-23 MED ORDER — SODIUM CHLORIDE 0.9 % IV SOLN
10.0000 ug/h | INTRAVENOUS | Status: DC
  Administered 2014-08-23: 175 ug/h via INTRAVENOUS
  Administered 2014-08-23: 25 ug/h via INTRAVENOUS
  Administered 2014-08-24: 250 ug/h via INTRAVENOUS
  Administered 2014-08-24 – 2014-08-25 (×2): 300 ug/h via INTRAVENOUS
  Administered 2014-08-25: 350 ug/h via INTRAVENOUS
  Administered 2014-08-25: 300 ug/h via INTRAVENOUS
  Administered 2014-08-26: 350 ug/h via INTRAVENOUS
  Administered 2014-08-27: 150 ug/h via INTRAVENOUS
  Filled 2014-08-23 (×10): qty 50

## 2014-08-23 MED ORDER — FENTANYL CITRATE (PF) 100 MCG/2ML IJ SOLN
100.0000 ug | INTRAMUSCULAR | Status: DC | PRN
  Administered 2014-08-24 (×2): 50 ug via INTRAVENOUS
  Administered 2014-08-24: 100 ug via INTRAVENOUS
  Administered 2014-08-24: 50 ug via INTRAVENOUS
  Administered 2014-08-25: 100 ug via INTRAVENOUS

## 2014-08-23 MED ORDER — PRISMASOL BGK 4/2.5 32-4-2.5 MEQ/L IV SOLN
INTRAVENOUS | Status: DC
  Filled 2014-08-23: qty 5000

## 2014-08-23 MED ORDER — PRISMASOL BGK 4/2.5 32-4-2.5 MEQ/L IV SOLN
INTRAVENOUS | Status: DC
Start: 2014-08-23 — End: 2014-08-28
  Administered 2014-08-23 – 2014-08-28 (×10): via INTRAVENOUS_CENTRAL
  Filled 2014-08-23 (×8): qty 5000

## 2014-08-23 MED ORDER — FENTANYL CITRATE (PF) 100 MCG/2ML IJ SOLN
INTRAMUSCULAR | Status: AC
  Filled 2014-08-23: qty 2

## 2014-08-23 NOTE — Progress Notes (Signed)
[facility] Progress Note Patient Name: Kyle [name] DOB: 08/16/1955 MRN: [id number]   Date of Service  08/23/2014  HPI/Events of Note  Agitation. Patient is now on a Fentanyl IV infusion at 125 mcg/hour.  eICU Interventions  Will order: 1. Increase ceiling on Fentanyl to 200 mcg/hour. 2. Increase rescue Fentanyl dose to 100 mcg Q 1 hour PRN.      Intervention Category Minor Interventions: Agitation / anxiety - evaluation and management  Lysle Dingwall 08/23/2014, 6:38 PM

## 2014-08-23 NOTE — Progress Notes (Signed)
Pharmacy: Vancomycin  59yom s/p PEA arrest during HD catheter placement, now on hypothermia protocol, was started on vancomycin yesterday for GPC growing in his paracentesis fluid. He received a loading dose 1750mg  last night at 2142. He is now to begin CVVHD so will schedule maintenance doses.   7/28 Vancomycin>> 7/27 paracentesis fluid>>GPC>>CoNS  Plan: 1) Vancomycin 1000mg  IV q24 - first dose tonight 2) Follow up culture sensitivities  Nena Jordan, PharmD, BCPS 08/23/2014, 1:40 PM

## 2014-08-23 NOTE — Progress Notes (Signed)
Initial Nutrition Assessment  DOCUMENTATION CODES:   Obesity unspecified  INTERVENTION:   Once rewarmed if remains on ventilator recommend:  Nepro @ 10 ml/hr  60 ml Prostat five times per day  Provides: 1240 kcal, 169 grams protein, and 174 ml H2O.   NUTRITION DIAGNOSIS:   Inadequate oral intake related to inability to eat as evidenced by NPO status.   GOAL:   Provide needs based on ASPEN/SCCM guidelines   MONITOR:   TF tolerance, Skin, Vent status, Labs  REASON FOR ASSESSMENT:   Ventilator    ASSESSMENT:   Mr. Bynes is a 59 yo male with PMHx of CKD Stage V and chronic systolic and diastolic CHF (EF 0000000) who was admitted for acute on chronic kidney and heart failure. Patient was anasarcous on admission and went for paracentesis yesterday. He later went for placement of a tunneled HD catheter and developed bradycardia after placement and went into PEA arrest of unclear etiology. Patient was intubated, resuscitated and place on hypothermia protocol.   Patient is currently intubated on ventilator support MV: 5.9 L/min Temp (24hrs), Avg:93.6 F (34.2 C), Min:91 F (32.8 C), Max:98.4 F (36.9 C)   Labs reviewed: BUN/Cr elevated Nutrition-Focused physical exam completed. Findings are no fat depletion, no muscle depletion, and moderate edema.  Discussed with RN.  Pt just re-warmed.   Diet Order:  Diet NPO time specified  Skin:  Wound (see comment) (stage I coccyx )  Last BM:  7/26  Height:   Ht Readings from Last 1 Encounters:  08/21/14 6' (1.829 m)    Weight:   Wt Readings from Last 1 Encounters:  08/23/14 236 lb 12.4 oz (107.4 kg)    Ideal Body Weight:  80.9 kg  BMI:  Body mass index is 32.11 kg/(m^2).  Estimated Nutritional Needs:   Kcal:  [id number]  Protein:  >/= 161 grams  Fluid:  > 1.5 L/day  EDUCATION NEEDS:   No education needs identified at this time  Brist[place], Willard, Damon Pager [phone] After Hours Pager

## 2014-08-23 NOTE — Progress Notes (Signed)
Attempted to place bite block to prevent patient from biting on the tube.  Pt continue to push bite block out with tongue.

## 2014-08-23 NOTE — Progress Notes (Signed)
Hypoglycemic Event  CBG: 68  Treatment: D50 IV 25 mL  Symptoms: None  Follow-up CBG: Time: 0230 CBG Result: 100  Possible Reasons for Event: Inadequate meal intake  Will continue to monitor    [place], Nehemiah Massed

## 2014-08-23 NOTE — Progress Notes (Signed)
Subjective: Kyle [name] is a 59 yo male with PMHx of CKD Stage V and chronic systolic and diastolic CHF (EF 0000000) who was admitted for acute on chronic kidney and heart failure who later went into PEA arrest of unclear etiology after placement of tunneled HD cath. Patient was intubated, resuscitated and place on hypothermia protocol which began the rewarming process last night. Patient was seen and examined this morning. He is intubated and sedated.   We discussed the patient's current situation and need for CRRT with his family (wife, oldest son Erlene Quan, and brother-in-law) at length. CRRT was explained along with risks associated wit HD. All questions were answered and wife agreed to CRRT.   Objective: Vital signs in last 24 hours: Filed Vitals:   08/23/14 1000 08/23/14 1100 08/23/14 1200 08/23/14 1208  BP:   173/103   Pulse: 97 91 101 87  Temp: 98.4 F (36.9 C) 98.2 F (36.8 C)    TempSrc: Core (Comment)     Resp:    12  Height:      Weight:      SpO2: 95% 97% 95% 99%   General: Vital signs reviewed. Patient is intubated, sedated.  HEENT: Intubated Cardiovascular: RRR, S1 normal, S2 normal Pulmonary/Chest: Inspiratory crackles in lower lung fields, no wheezes, or rhonchi. Abdominal: Nontender, hypoactive BS + Extremities: 2+ woody pitting lower extremity edema bilaterally  Skin: Warm, dry and intact. No rashes or erythema. Neuro: Can follow some commands. Able to move upper extremities. No movement of lower extremities.   Intake/Output from previous day: 07/28 0701 - 07/29 0700 In: 1514.5 [I.V.:914.5; IV Piggyback:600] Out: -  Intake/Output this shift: Total I/O In: 114.8 [I.V.:114.8] Out: -   Lab Results:  Recent Labs  08/22/14 0445 08/23/14 0500  WBC 8.4 10.1  HGB 12.5* 12.8*  HCT 36.5* 37.8*  PLT 329 366   BMET:   Recent Labs  08/23/14 0500 08/23/14 0900  NA 138  138 139  K 3.8  3.7 4.0  CL 107  106 106  CO2 17*  17* 19*  GLUCOSE 88  85 84  BUN  96*  96* 96*  CREATININE 10.10*  10.12* 10.13*  CALCIUM 6.8*  6.8* 6.8*   No results for input(s): PTH in the last 72 hours. Iron Studies: No results for input(s): IRON, TIBC, TRANSFERRIN, FERRITIN in the last 72 hours. CBG (last 3)   Recent Labs  08/23/14 0717 08/23/14 1004 08/23/14 1110  GLUCAP 75 89 93     Studies/Results: US Paracentesis  08/22/2014   CLINICAL DATA:  Ascites secondary to congestive heart failure and acute on chronic kidney disease stage 5  EXAM: ULTRASOUND GUIDED LEFT LOWER QUADRANT PARACENTESIS  COMPARISON:  None.  PROCEDURE: An ultrasound guided paracentesis was thoroughly discussed with the patient and questions answered. The benefits, risks, alternatives and complications were also discussed. The patient understands and wishes to proceed with the procedure. Written consent was obtained.  Ultrasound was performed to localize and mark an adequate pocket of fluid in the left lower quadrant of the abdomen. The area was then prepped and draped in the normal sterile fashion. 1% Lidocaine was used for local anesthesia. Under ultrasound guidance a 19 gauge Yueh catheter was introduced. Paracentesis was performed. The catheter was removed and a dressing applied.  COMPLICATIONS: None.  FINDINGS: A total of approximately 2.5 liters of clear yellow fluid was removed. A fluid sample was sent for laboratory analysis.  IMPRESSION: Successful ultrasound guided paracentesis yielding 2.5 liters of ascites.  Read by:  Gareth Eagle, PA-C   Electronically Signed   By: Sandi Mariscal M.D.   On: 08/21/2014 14:24   Dg Chest Port 1 View  08/23/2014   CLINICAL DATA:  Respiratory difficulty  EXAM: PORTABLE CHEST - 1 VIEW  COMPARISON:  08/21/2014  FINDINGS: Tubular devices are stable. Normal heart size. Bilateral pleural effusions are stable. Central airspace disease is stable. No pneumothorax.  IMPRESSION: Stable bilateral pleural effusions and bilateral central airspace disease.   Electronically  Signed   By: Marybelle Killings M.D.   On: 08/23/2014 07:44   Dg Chest Port 1 View  08/21/2014   CLINICAL DATA:  Hypoxia  EXAM: PORTABLE CHEST - 1 VIEW  COMPARISON:  August 20, 2014  FINDINGS: Endotracheal tube tip is 2.9 cm above the carina. Left jugular catheter tip is in the superior vena cava just beyond the junction with the left innominate vein. Dual-lumen catheter tip is in the right atrium. Nasogastric tube tip and side port are below the diaphragm. No pneumothorax. There is cardiomegaly with bilateral effusions and alveolar edema bilaterally. No adenopathy.  IMPRESSION: Congestive heart failure. Tube and catheter positions as described without pneumothorax.   Electronically Signed   By: Lowella Grip III M.D.   On: 08/21/2014 18:41   Dg Abd Portable 1v  08/21/2014   CLINICAL DATA:  OG tube placement  EXAM: PORTABLE ABDOMEN - 1 VIEW  COMPARISON:  None.  FINDINGS: NG tube with tip in the gastric body. Paucity of gas in the abdomen. Bilateral pleural effusions.  IMPRESSION: NG tube with tip in the stomach.   Electronically Signed   By: Suzy Bouchard M.D.   On: 08/21/2014 18:42   Dg Fluoro Guide Cv Line-no Report  08/21/2014   CLINICAL DATA:    FLOURO GUIDE CV LINE  Fluoroscopy was utilized by the requesting physician.  No radiographic  interpretation.     I have reviewed the patient's current medications. Prior to Admission:  Prescriptions prior to admission  Medication Sig Dispense Refill Last Dose  . aspirin 81 MG chewable tablet Chew 1 tablet (81 mg total) by mouth daily.   08/19/2014 at Unknown time  . calcitRIOL (ROCALTROL) 0.25 MCG capsule Take 1 capsule (0.25 mcg total) by mouth every Monday, Wednesday, and Friday. 30 capsule 1 08/19/2014 at Unknown time  . carvedilol (COREG) 12.5 MG tablet Take 0.5 tablets (6.25 mg total) by mouth 2 (two) times daily with a meal. 60 tablet 1 08/20/2014 at 1000  . ferrous sulfate 325 (65 FE) MG tablet Take 1 tablet (325 mg total) by mouth daily with  breakfast. 30 tablet 3 08/20/2014 at Unknown time  . furosemide (LASIX) 80 MG tablet Take 1 tablet (80 mg total) by mouth 3 (three) times daily. 90 tablet 1 08/20/2014 at Unknown time  . glimepiride (AMARYL) 2 MG tablet Take 1 tablet (2 mg total) by mouth daily with breakfast. 30 tablet 1 08/20/2014 at Unknown time  . hydrALAZINE (APRESOLINE) 25 MG tablet Take 1.5 tablets (37.5 mg total) by mouth 3 (three) times daily. (Patient not taking: Reported on 08/20/2014) 90 tablet 0 Not Taking at Unknown time  . isosorbide dinitrate (ISORDIL) 20 MG tablet Take 1 tablet (20 mg total) by mouth 3 (three) times daily. (Patient not taking: Reported on 08/20/2014) 90 tablet 0 Not Taking at Unknown time  . isosorbide-hydrALAZINE (BIDIL) 20-37.5 MG per tablet Take 1 tablet by mouth 3 (three) times daily. (Patient not taking: Reported on 08/20/2014) 90 tablet 1 Not Taking  at Unknown time   Scheduled: . antiseptic oral rinse  7 mL Mouth Rinse QID  . artificial tears  1 application Both Eyes 3 times per day  . chlorhexidine  15 mL Mouth Rinse BID  . feeding supplement (VITAL HIGH PROTEIN)  1,000 mL Per Tube Q24H  . fentaNYL      . heparin  5,000 Units Subcutaneous 3 times per day  . insulin aspart  0-20 Units Subcutaneous 6 times per day  . metoprolol tartrate  50 mg Per Tube BID  . pantoprazole sodium  40 mg Per Tube Q24H  . sodium chloride  3 mL Intravenous Q12H  . [START ON 08/24/2014] vancomycin  1,500 mg Intravenous Q48H   Continuous: . sodium chloride 10 mL/hr (08/23/14 1133)  . norepinephrine (LEVOPHED) Adult infusion 3 mcg/min (08/23/14 0800)   [id number] (SUBLIMAZE) injection, hydrALAZINE, [DISCONTINUED] ondansetron **OR** ondansetron (ZOFRAN) IV  Assessment/Plan: Acute on Chronic Kidney Disease, Stage V: Likely ESRD. Creatinine >10 on admission and GFR of 5.  -Initiate CRRT -Obtain AVF vs. AVG when medically stable -Repeat renal function panel tomorrow am -Strict I/Os  Hyperphosphatemia:  Phosphorus 6.3 this morning. -Consider PhosLo  HTN: Now hypotensive requiring low dose pressors.  -On levophed  Anemia of Chronic Disease: Likely secondary to CKD. Hemoglobin 9.9 today, at baseline of 10.  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  -Per Cardiology   Possible Peritonitis: Paracentesis fluid was positive for GPC in clusters. -On Vancomycin  S/p PEA Arrest: Rewarming after hypothermia protocol. Intubated and sedated. Patient does have purposeful movements. -Per Cardiology/PCCM  DVT/PE ppx: On Heparin TID    LOS: 3 days   Osa Craver, DO PGY-2 Internal Medicine Resident Pager # [phone] 08/23/2014 12:35 PM

## 2014-08-23 NOTE — Progress Notes (Addendum)
Versed 18mls and fent 120mls wasted in sink. Witnessed by, P. Burton Apley, RN

## 2014-08-23 NOTE — Progress Notes (Signed)
PULMONARY / CRITICAL CARE MEDICINE   Name: REFAEL BERGARA MRN: [id number] DOB: 10/06/1955    ADMISSION DATE:  08/20/2014 CONSULTATION DATE:  08/20/2014  REFERRING MD :  Ree Kida   CHIEF COMPLAINT:  PEA arrest    INITIAL PRESENTATION:   65 with hx of ESRD presented for tunneled HD catheter.  Developed bradycardic PEA arrest >> about 10 minutes for ROSC.  Hx of systolic CHF with baseline EF 15%.  STUDIES:  7/27 paracentesis 7/28 Echo >> EF 10%  SIGNIFICANT EVENTS: 7/27 Cardiac arrest, intubated and to ICU, hypothermia 7/29 rewarmed  SUBJECTIVE:  Off pressors, rewarmed.  VITAL SIGNS: Temp:  [91 F (32.8 C)-98.4 F (36.9 C)] 98.4 F (36.9 C) (07/29 1000) Pulse Rate:  [48-97] 97 (07/29 1000) Resp:  [12] 12 (07/29 0805) BP: (89-135)/(66-93) 126/93 mmHg (07/29 0800) SpO2:  [95 %-100 %] 95 % (07/29 1000) FiO2 (%):  [30 %] 30 % (07/29 0805) Weight:  [236 lb 12.4 oz (107.4 kg)] 236 lb 12.4 oz (107.4 kg) (07/29 0421)   HEMODYNAMICS: CVP:  [7 mmHg-10 mmHg] 7 mmHg  VENTILATOR SETTINGS: Vent Mode:  [-] PRVC FiO2 (%):  [30 %] 30 % Set Rate:  [12 bmp] 12 bmp Vt Set:  [id number] mL] 620 mL PEEP:  [5 cmH20] 5 cmH20 Plateau Pressure:  [21 [id number] cmH20] 21 cmH20   INTAKE / OUTPUT:  Intake/Output Summary (Last 24 hours) at 08/23/14 1101 Last data filed at 08/23/14 1000  Gross per 24 hour  Intake 1453.21 ml  Output      0 ml  Net 1453.21 ml   PHYSICAL EXAMINATION: General: intubated Neuro: localizes to voice, moving extremities HEENT: pupils reactive Cardiovascular:  regular Lungs: no wheeze Abdomen:  Soft, non tender Musculoskeletal: 1+ edema  LABS:  CBC  Recent Labs Lab 08/21/14 1700 08/22/14 0445 08/23/14 0500  WBC 7.2 8.4 10.1  HGB 9.5* 12.5* 12.8*  HCT 28.3* 36.5* 37.8*  PLT 276 329 366   Coag's  Recent Labs Lab 08/20/14 2058 08/21/14 1700 08/22/14 0115  APTT 36 34 35  INR 1.28 1.30 1.29   BMET  Recent Labs Lab 08/23/14 0100 08/23/14 0500  08/23/14 0900  NA 137 138  138 139  K 3.6 3.8  3.7 4.0  CL 106 107  106 106  CO2 18* 17*  17* 19*  BUN 94* 96*  96* 96*  CREATININE 10.07* 10.10*  10.12* 10.13*  GLUCOSE 82 88  85 84   Electrolytes  Recent Labs Lab 08/21/14 2050  08/22/14 0445  08/23/14 0100 08/23/14 0500 08/23/14 0900  CALCIUM 6.5*  < > 6.5*  < > 6.6* 6.8*  6.8* 6.8*  MG 1.6*  --  1.6*  --   --  1.6*  --   PHOS 5.3*  --  5.4*  --   --  6.3*  6.3*  --   < > = values in this interval not displayed. Sepsis Markers  Recent Labs Lab 08/21/14 1656 08/21/14 1700 08/21/14 2050  LATICACIDVEN 1.51 1.7 1.0   ABG  Recent Labs Lab 08/22/14 0600 08/22/14 0850 08/23/14 0342  PHART 7.522* 7.454* 7.440  PCO2ART 18.5* 24.1* 25.0*  PO2ART 210* 172* 123.0*   Liver Enzymes  Recent Labs Lab 08/20/14 2058 08/21/14 0802 08/21/14 1700 08/22/14 0445 08/23/14 0500  AST 33 24 31  --   --   ALT 47 38 40  --   --   ALKPHOS 150* 130* 116  --   --   BILITOT 0.7 0.5  0.6  --   --   ALBUMIN 2.1* 1.9* 1.8* 1.5* 1.6*   Cardiac Enzymes  Recent Labs Lab 08/21/14 1700 08/21/14 2050 08/22/14 0445  TROPONINI 0.06* 0.17* 0.15*   Glucose  Recent Labs Lab 08/23/14 0207 08/23/14 0230 08/23/14 0339 08/23/14 0554 08/23/14 0717 08/23/14 1004  GLUCAP 68 100* 95 77 75 89    Imaging Dg Chest Port 1 View  08/23/2014   CLINICAL DATA:  Respiratory difficulty  EXAM: PORTABLE CHEST - 1 VIEW  COMPARISON:  08/21/2014  FINDINGS: Tubular devices are stable. Normal heart size. Bilateral pleural effusions are stable. Central airspace disease is stable. No pneumothorax.  IMPRESSION: Stable bilateral pleural effusions and bilateral central airspace disease.   Electronically Signed   By: Marybelle Killings M.D.   On: 08/23/2014 07:44     ASSESSMENT / PLAN:  PULMONARY ETT 7/27>>> A: Acute respiratory failure after cardiac arrest. P:   Full vent support until mental status improved F/u CXR  CARDIOVASCULAR Rt IJ HD  cath 7/27 >>  Lt IJ CVL 7/27 >> Rt femoral aline 7/27 >> A: PEA cardiac arrest. Acute on chronic combined CHF. Hx of HTN. P:  PRN hydralazine Cardiology following  RENAL A: ESRD. Non gap metabolic acidosis. P:   Per renal  GASTROINTESTINAL A: Nutrition. Ascites. P:   Tube feeds Protonix for SUP  HEMATOLOGIC A: Anemia of chronic disease. P:  F/u CBC SQ heparin for DVT prevention  INFECTIOUS A: Possible SBP >> GPC in clusters in peritoneal fluid. P:   Day 1 vancomycin   Peritoneal fluid 7/27 >> GPC in clusters >>  ENDOCRINE A: Hx of DM. Episodes of hypoglycemia. P:   SSI   NEUROLOGIC A: Acute encephalopathy after cardiac arrest >> some optimistic findings on neuro exam 7/29. P:   Monitor mental status RASS goal 0 PRN fentanyl Defer neuro assessment for now  Updated pts family at bedside.  CC time 40 minutes.  Chesley Mires, MD [facility] Pulmonary/Critical Care 08/23/2014, 11:31 AM Pager:  [phone] After 3pm call: [phone]

## 2014-08-23 NOTE — Progress Notes (Signed)
    No significant cardiac events overnight. Await assessment of neuro status post rewarming.  Jettie Booze, MD

## 2014-08-23 NOTE — Progress Notes (Signed)
Problems with access and Return pressures, Both ports flushed and aspirated. Blood flow rate decreased, pressure pods cleaned with alcohol. Ports also switched. All attempts to get machine working futile. Clinical support  notified , stated maybe it's positional, told her it was subclavian. Wasn't sure what it could be. Dr. Justin Mend notified. Stated ok to leave for nite, will have critical care put in another access in am. Will cont to monitor closely.

## 2014-08-24 ENCOUNTER — Inpatient Hospital Stay (HOSPITAL_COMMUNITY): Payer: 59

## 2014-08-24 DIAGNOSIS — J9601 Acute respiratory failure with hypoxia: Secondary | ICD-10-CM | POA: Diagnosis present

## 2014-08-24 LAB — MAGNESIUM: MAGNESIUM: 1.7 mg/dL (ref 1.7–2.4)

## 2014-08-24 LAB — RENAL FUNCTION PANEL
ALBUMIN: 1.8 g/dL — AB (ref 3.5–5.0)
ANION GAP: 13 (ref 5–15)
Albumin: 1.7 g/dL — ABNORMAL LOW (ref 3.5–5.0)
Anion gap: 13 (ref 5–15)
BUN: 85 mg/dL — AB (ref 6–20)
BUN: 73 mg/dL — ABNORMAL HIGH (ref 6–20)
CALCIUM: 6.8 mg/dL — AB (ref 8.9–10.3)
CHLORIDE: 107 mmol/L (ref 101–111)
CO2: 19 mmol/L — AB (ref 22–32)
CO2: 22 mmol/L (ref 22–32)
Calcium: 7.1 mg/dL — ABNORMAL LOW (ref 8.9–10.3)
Chloride: 104 mmol/L (ref 101–111)
Creatinine, Ser: 9.54 mg/dL — ABNORMAL HIGH (ref 0.61–1.24)
Creatinine, Ser: 7.47 mg/dL — ABNORMAL HIGH (ref 0.61–1.24)
GFR calc Af Amer: 6 mL/min — ABNORMAL LOW (ref >60)
GFR calc non Af Amer: 5 mL/min — ABNORMAL LOW (ref >60)
GFR, EST AFRICAN AMERICAN: 8 mL/min — AB (ref >60)
GFR, EST NON AFRICAN AMERICAN: 7 mL/min — AB (ref >60)
GLUCOSE: 139 mg/dL — AB (ref 65–99)
Glucose, Bld: 122 mg/dL — ABNORMAL HIGH (ref 65–99)
POTASSIUM: 4.6 mmol/L (ref 3.5–5.1)
Phosphorus: 7.6 mg/dL — ABNORMAL HIGH (ref 2.5–4.6)
Phosphorus: 6 mg/dL — ABNORMAL HIGH (ref 2.5–4.6)
Potassium: 4.3 mmol/L (ref 3.5–5.1)
SODIUM: 139 mmol/L (ref 135–145)
SODIUM: 139 mmol/L (ref 135–145)

## 2014-08-24 LAB — CBC
HCT: 30.5 % — ABNORMAL LOW (ref 39.0–52.0)
Hemoglobin: 10 g/dL — ABNORMAL LOW (ref 13.0–17.0)
MCH: 26.7 pg (ref 26.0–34.0)
MCHC: 32.8 g/dL (ref 30.0–36.0)
MCV: 81.3 fL (ref 78.0–100.0)
Platelets: 292 10*3/uL (ref 150–400)
RBC: 3.75 MIL/uL — ABNORMAL LOW (ref 4.22–5.81)
RDW: 19 % — AB (ref 11.5–15.5)
WBC: 11.1 10*3/uL — ABNORMAL HIGH (ref 4.0–10.5)

## 2014-08-24 LAB — GLUCOSE, CAPILLARY
GLUCOSE-CAPILLARY: 133 mg/dL — AB (ref 65–99)
Glucose-Capillary: 144 mg/dL — ABNORMAL HIGH (ref 65–99)
Glucose-Capillary: 114 mg/dL — ABNORMAL HIGH (ref 65–99)
Glucose-Capillary: 113 mg/dL — ABNORMAL HIGH (ref 65–99)
Glucose-Capillary: 121 mg/dL — ABNORMAL HIGH (ref 65–99)

## 2014-08-24 MED ORDER — ALTEPLASE 100 MG IV SOLR
2.0000 mg | Freq: Once | INTRAVENOUS | Status: AC
  Administered 2014-08-24: 2 mg
  Filled 2014-08-24: qty 2

## 2014-08-24 MED ORDER — NAFCILLIN SODIUM 1 G IJ SOLR
1.0000 g | INTRAVENOUS | Status: DC
  Filled 2014-08-24 (×5): qty 1000

## 2014-08-24 MED ORDER — NAFCILLIN SODIUM 1 G IJ SOLR
1.0000 g | INTRAMUSCULAR | Status: DC
  Filled 2014-08-24 (×5): qty 1000

## 2014-08-24 MED ORDER — PIPERACILLIN-TAZOBACTAM 3.375 G IVPB
3.3750 g | Freq: Four times a day (QID) | INTRAVENOUS | Status: DC
  Filled 2014-08-24 (×3): qty 50

## 2014-08-24 MED ORDER — BISACODYL 10 MG RE SUPP
10.0000 mg | Freq: Every day | RECTAL | Status: DC | PRN
Start: 2014-08-24 — End: 2014-08-30
  Administered 2014-08-24 – 2014-08-25 (×2): 10 mg via RECTAL
  Filled 2014-08-24 (×2): qty 1

## 2014-08-24 MED ORDER — DOCUSATE SODIUM 50 MG/5ML PO LIQD: 100.0000 mg | Freq: Two times a day (BID) | ORAL | Status: DC

## 2014-08-24 MED ORDER — ROCURONIUM BROMIDE 50 MG/5ML IV SOLN
50.0000 mg | Freq: Once | INTRAVENOUS | Status: AC
  Administered 2014-08-24: 10 mg via INTRAVENOUS

## 2014-08-24 MED ORDER — PIPERACILLIN-TAZOBACTAM 3.375 G IVPB 30 MIN
3.3750 g | Freq: Four times a day (QID) | INTRAVENOUS | Status: DC
  Administered 2014-08-24 – 2014-08-28 (×15): 3.375 g via INTRAVENOUS
  Filled 2014-08-24 (×17): qty 50

## 2014-08-24 MED ORDER — ETOMIDATE 2 MG/ML IV SOLN
20.0000 mg | Freq: Once | INTRAVENOUS | Status: AC
  Administered 2014-08-24: 20 mg via INTRAVENOUS

## 2014-08-24 NOTE — Procedures (Signed)
Intubation Procedure Note > ETT Exchange MAHAN GUBBELS [id number] 1955/12/13  Procedure: Intubation > Exchange of endotracheal tube Indications: ETT needed to be changed  Procedure Details Consent: Unable to obtain consent because of emergent medical necessity. Time Out: Verified patient identification, verified procedure, site/side was marked, verified correct patient position, special equipment/implants available, medications/allergies/relevent history reviewed, required imaging and test results available.  Performed  Drugs Etomidate 20mg , Rocuronium 50mg  DL x 1 with MAC 4 blade Grade 1 view 7.5 tube passed through cords under direct visualization Placement confirmed with bilateral breath sounds, positive EtCO2 change and smoke in tube   Evaluation Hemodynamic Status: BP stable throughout; O2 sats: transiently fell during during procedure, recovered within 30 seconds with bag ventilation. Patient's Current Condition: stable Complications: No apparent complications Patient did tolerate procedure well. Chest X-ray ordered to verify placement.  CXR: pending.   MCQUAID, DOUGLAS 08/24/2014

## 2014-08-24 NOTE — Progress Notes (Signed)
RN was able to restart CVVHD at 2200 7/29 via the existing HD catheter in right chest, first 2 hours there were no problems with blood flow and access/return pressure, but by 0200 this morning the red port on the HD catheter was not working well at all with the CRRT machine, despite flushing, pulling back and switching the ports. Clinical support called and advised to TPA the line. Dr. Justin Mend called and order for TPA obtained. IV team administered. Will continue to monitor closely.

## 2014-08-24 NOTE — Progress Notes (Signed)
[place] Progress Note Patient Name: Kyle [name] DOB: 11/30/1955 MRN: [id number]   Date of Service  08/24/2014  HPI/Events of Note  Nurse concerned about copious respiratory secretions.   eICU Interventions  Will order: 1. Sputum tracheal aspirate for Gm stain and culture.  2. Zosyn IV.      Intervention Category Intermediate Interventions: Infection - evaluation and management  Lakeshia Dohner Eugene 08/24/2014, 5:01 PM

## 2014-08-24 NOTE — Progress Notes (Signed)
[place] Progress Note Patient Name: CORVUS MAROCCO DOB: Jan 11, 1956 MRN: [id number]   Date of Service  08/24/2014  HPI/Events of Note  Constipation. No BM since admission.   eICU Interventions  Will order: 1. Colace 100 mg PO BID. 2. Duclolax Suppository PRN.      Intervention Category Minor Interventions: Routine modifications to care plan (e.g. PRN medications for pain, fever)  Sommer,Steven Eugene 08/24/2014, 3:37 PM

## 2014-08-24 NOTE — Progress Notes (Signed)
PULMONARY / CRITICAL CARE MEDICINE   Name: Kyle [name] MRN: [id number] DOB: [birth year]    ADMISSION DATE:  08/20/2014 CONSULTATION DATE:  08/20/2014  REFERRING MD :  Ree Kida   CHIEF COMPLAINT:  PEA arrest    INITIAL PRESENTATION:   33 with hx of ESRD presented for tunneled HD catheter.  Developed bradycardic PEA arrest >> about 10 minutes for ROSC.  Hx of systolic CHF with baseline EF 15%.  STUDIES:  7/27 paracentesis 7/28 Echo >> EF 10%  SIGNIFICANT EVENTS: 7/27 Cardiac arrest, intubated and to ICU, hypothermia 7/29 rewarmed 7/30 more agitated  SUBJECTIVE:  Requires sedation  VITAL SIGNS: Temp:  [97.2 F (36.2 C)-99.1 F (37.3 C)] 97.9 F (36.6 C) (07/30 0800) Pulse Rate:  [70-108] 72 (07/30 0900) Resp:  [12] 12 (07/30 0805) BP: (105-173)/(63-104) 130/70 mmHg (07/30 0805) SpO2:  [95 %-100 %] 100 % (07/30 0900) Arterial Line BP: (95-175)/(55-98) 95/55 mmHg (07/30 0900) FiO2 (%):  [30 %] 30 % (07/30 0805) Weight:  [106.4 kg (234 lb 9.1 oz)] 106.4 kg (234 lb 9.1 oz) (07/30 0400)   HEMODYNAMICS: CVP:  [7 mmHg-18 mmHg] 18 mmHg  VENTILATOR SETTINGS: Vent Mode:  [-] PRVC FiO2 (%):  [30 %] 30 % Set Rate:  [12 bmp] 12 bmp Vt Set:  [id number] mL] 620 mL PEEP:  [5 cmH20] 5 cmH20 Plateau Pressure:  [18 cmH20-24 cmH20] 24 cmH20   INTAKE / OUTPUT:  Intake/Output Summary (Last 24 hours) at 08/24/14 1006 Last data filed at 08/24/14 0900  Gross per 24 hour  Intake 1039.72 ml  Output    552 ml  Net 487.72 ml   PHYSICAL EXAMINATION: General: intubated, chewing on tube Neuro: localizes to voice, moving extremities equally, follows commands HEENT: pupils reactive, rt subclav clean tunneled  Cardiovascular: s1 s2  regularr Lungs: CTA Abdomen:  Soft, non tender Musculoskeletal: 2+ edema  LABS:  CBC  Recent Labs Lab 08/22/14 0445 08/23/14 0500 08/24/14 0400  WBC 8.4 10.1 11.1*  HGB 12.5* 12.8* 10.0*  HCT 36.5* 37.8* 30.5*  PLT 329 366 292   Coag's  Recent  Labs Lab 08/20/14 2058 08/21/14 1700 08/22/14 0115  APTT 36 34 35  INR 1.28 1.30 1.29   BMET  Recent Labs Lab 08/23/14 0900 08/23/14 1620 08/24/14 0400  NA 139 139 139  K 4.0 4.2 4.3  CL 106 107 107  CO2 19* 18* 19*  BUN 96* 90* 85*  CREATININE 10.13* 10.04* 9.54*  GLUCOSE 84 98 139*   Electrolytes  Recent Labs Lab 08/22/14 0445  08/23/14 0500 08/23/14 0900 08/23/14 1620 08/24/14 0400  CALCIUM 6.5*  < > 6.8*  6.8* 6.8* 6.9* 6.8*  MG 1.6*  --  1.6*  --   --  1.7  PHOS 5.4*  --  6.3*  6.3*  --  7.0* 7.6*  < > = values in this interval not displayed. Sepsis Markers  Recent Labs Lab 08/21/14 1656 08/21/14 1700 08/21/14 2050  LATICACIDVEN 1.51 1.7 1.0   ABG  Recent Labs Lab 08/22/14 0600 08/22/14 0850 08/23/14 0342  PHART 7.522* 7.454* 7.440  PCO2ART 18.5* 24.1* 25.0*  PO2ART 210* 172* 123.0*   Liver Enzymes  Recent Labs Lab 08/20/14 2058 08/21/14 0802 08/21/14 1700  08/23/14 0500 08/23/14 1620 08/24/14 0400  AST 33 24 31  --   --   --   --   ALT 47 38 40  --   --   --   --   ALKPHOS 150* 130* 116  --   --   --   --  BILITOT 0.7 0.5 0.6  --   --   --   --   ALBUMIN 2.1* 1.9* 1.8*  < > 1.6* 1.8* 1.7*  < > = values in this interval not displayed. Cardiac Enzymes  Recent Labs Lab 08/21/14 1700 08/21/14 2050 08/22/14 0445  TROPONINI 0.06* 0.17* 0.15*   Glucose  Recent Labs Lab 08/23/14 1004 08/23/14 1110 08/23/14 1539 08/23/14 2002 08/23/14 2342 08/24/14 0410  GLUCAP 89 93 79 90 115* 133*    Imaging Dg Chest Port 1 View  08/24/2014   CLINICAL DATA:  Respiratory failure  EXAM: PORTABLE CHEST - 1 VIEW  COMPARISON:  08/23/2014  FINDINGS: Layering moderate right pleural effusion. Small left pleural effusion.  Associated bilateral lower lobe opacities, likely atelectasis.  Pulmonary vascular congestion without frank interstitial edema.  Endotracheal tube terminates 5.5 cm above the carina.  The heart is normal in size.  Right IJ  dual lumen dialysis catheter with its tip in the right atrium. Left IJ venous catheter with its tip in the mid SVC.  Enteric tube courses below the diaphragm.  IMPRESSION: Pulmonary vascular congestion without frank interstitial edema.  Layering moderate right pleural effusion. Small left pleural effusion.  Associated bilateral lower lobe opacities, likely atelectasis.  Endotracheal tube terminates 5.5 cm above the carina. Additional support apparatus as above.   Electronically Signed   By: Julian Hy M.D.   On: 08/24/2014 08:17     ASSESSMENT / PLAN:  PULMONARY ETT 7/27>>> A: Acute respiratory failure after cardiac arrest. P:   Full vent support until mental status improved F/u CXR Wean sbt cpap 5 ps 5, goal 2 hr, no extubation planned Need neg balance  CARDIOVASCULAR Rt IJ HD cath 7/27 >>  Lt IJ CVL 7/27 >> Rt femoral aline 7/27 >> A: PEA cardiac arrest. Acute on chronic combined CHF. Hx of HTN. P:  PRN hydralazine Cardiology following consider dc aline  RENAL A: ESRD. Non gap metabolic acidosis. P:   cvvhd Neg balance goals, will contact renal Chem in am   GASTROINTESTINAL A: Nutrition. Ascites. P:   Tube feeds, advance to goal Protonix for SUP Neg balance needed  HEMATOLOGIC A: Anemia of chronic disease. P:  F/u CBC SQ heparin for DVT prevention  INFECTIOUS A: Possible SBP >> GPC in clusters in peritoneal fluid. P:   vancomycin   Peritoneal fluid 7/27 >> GPC in clusters >>coag neg staph  May ne able to narrow to nafcillin  ENDOCRINE A: Hx of DM. Episodes of hypoglycemia. P:   SSI   NEUROLOGIC A: Acute encephalopathy after cardiac arrest >> some optimistic findings on neuro exam 7/29. P:   Monitor mental status RASS goal 0 PRN fentanyl WUA   CC time 30 minutes.  Lavon Paganini. Titus Mould, MD, [place] Pgr: [facility]

## 2014-08-24 NOTE — Progress Notes (Signed)
Patient ID: Kyle [name], male   DOB: Jun 11, 1955, 59 y.o.   MRN: [id number]    Subjective:  Intubated agitated Discussed with nurse  Hemodynamics fine with no bradycardia or hypotension   Objective:  Filed Vitals:   08/24/14 0700 08/24/14 0800 08/24/14 0805 08/24/14 0900  BP:   130/70   Pulse: 82 84 86 72  Temp: 97.7 F (36.5 C) 97.9 F (36.6 C)    TempSrc: Core (Comment) Core (Comment)    Resp:   12   Height:      Weight:      SpO2: 100% 100% 100% 100%    Intake/Output from previous day:  Intake/Output Summary (Last 24 hours) at 08/24/14 0911 Last data filed at 08/24/14 0900  Gross per 24 hour  Intake 1051.02 ml  Output    552 ml  Net 499.02 ml    Physical Exam: Intubated  Chronically ill black male  HEENT: normal Neck supple with no adenopathy  Right Ditech catheter  JVP normal no bruits no thyromegaly Lungs clear with no wheezing and good diaphragmatic motion Heart:  S1/S2 SEM  murmur, no rub, gallop or click PMI normal Abdomen: benighn, BS positve, no tenderness, no AAA no bruit.  No HSM or HJR Distal pulses intact with no bruits Bilateral edema Neuro non-focal agitated  Skin warm and dry    Lab Results: Basic Metabolic Panel:  Recent Labs  08/23/14 0500  08/23/14 1620 08/24/14 0400  NA 138  138  < > 139 139  K 3.8  3.7  < > 4.2 4.3  CL 107  106  < > 107 107  CO2 17*  17*  < > 18* 19*  GLUCOSE 88  85  < > 98 139*  BUN 96*  96*  < > 90* 85*  CREATININE 10.10*  10.12*  < > 10.04* 9.54*  CALCIUM 6.8*  6.8*  < > 6.9* 6.8*  MG 1.6*  --   --  1.7  PHOS 6.3*  6.3*  --  7.0* 7.6*  < > = values in this interval not displayed. Liver Function Tests:  Recent Labs  08/21/14 1700  08/23/14 1620 08/24/14 0400  AST 31  --   --   --   ALT 40  --   --   --   ALKPHOS 116  --   --   --   BILITOT 0.6  --   --   --   PROT 5.1*  --   --   --   ALBUMIN 1.8*  < > 1.8* 1.7*  < > = values in this interval not displayed. No results for input(s):  LIPASE, AMYLASE in the last 72 hours. CBC:  Recent Labs  08/21/14 1700  08/23/14 0500 08/24/14 0400  WBC 7.2  < > 10.1 11.1*  NEUTROABS 6.4  --   --   --   HGB 9.5*  < > 12.8* 10.0*  HCT 28.3*  < > 37.8* 30.5*  MCV 79.9  < > 78.9 81.3  PLT 276  < > 366 292  < > = values in this interval not displayed. Cardiac Enzymes:  Recent Labs  08/21/14 1700 08/21/14 2050 08/22/14 0445  TROPONINI 0.06* 0.17* 0.15*    Imaging: Dg Chest Port 1 View  08/24/2014   CLINICAL DATA:  Respiratory failure  EXAM: PORTABLE CHEST - 1 VIEW  COMPARISON:  08/23/2014  FINDINGS: Layering moderate right pleural effusion. Small left pleural effusion.  Associated bilateral lower lobe  opacities, likely atelectasis.  Pulmonary vascular congestion without frank interstitial edema.  Endotracheal tube terminates 5.5 cm above the carina.  The heart is normal in size.  Right IJ dual lumen dialysis catheter with its tip in the right atrium. Left IJ venous catheter with its tip in the mid SVC.  Enteric tube courses below the diaphragm.  IMPRESSION: Pulmonary vascular congestion without frank interstitial edema.  Layering moderate right pleural effusion. Small left pleural effusion.  Associated bilateral lower lobe opacities, likely atelectasis.  Endotracheal tube terminates 5.5 cm above the carina. Additional support apparatus as above.   Electronically Signed   By: Julian Hy M.D.   On: 08/24/2014 08:17   Dg Chest Port 1 View  08/23/2014   CLINICAL DATA:  Respiratory difficulty  EXAM: PORTABLE CHEST - 1 VIEW  COMPARISON:  08/21/2014  FINDINGS: Tubular devices are stable. Normal heart size. Bilateral pleural effusions are stable. Central airspace disease is stable. No pneumothorax.  IMPRESSION: Stable bilateral pleural effusions and bilateral central airspace disease.   Electronically Signed   By: Marybelle Killings M.D.   On: 08/23/2014 07:44    Cardiac Studies:  ECG:   SR rate 54 QT 551  7/28   Telemetry:  NSR  No  bradycardia or VT  08/24/2014   Echo:  Severe LVH EF 10%    Medications:   . antiseptic oral rinse  7 mL Mouth Rinse QID  . artificial tears  1 application Both Eyes 3 times per day  . chlorhexidine  15 mL Mouth Rinse BID  . feeding supplement (NEPRO CARB STEADY)  1,000 mL Per Tube Q24H  . feeding supplement (PRO-STAT SUGAR FREE 64)  60 mL Per Tube 5 X Daily  . heparin  5,000 Units Subcutaneous 3 times per day  . insulin aspart  0-20 Units Subcutaneous 6 times per day  . metoprolol tartrate  50 mg Per Tube BID  . pantoprazole sodium  40 mg Per Tube Q24H  . sodium chloride  3 mL Intravenous Q12H  . vancomycin  1,000 mg Intravenous Q24H     . sodium chloride Stopped (08/24/14 0300)  . fentaNYL infusion INTRAVENOUS 200 mcg/hr (08/24/14 0000)  . norepinephrine (LEVOPHED) Adult infusion 3 mcg/min (08/23/14 0800)  . dialysis replacement fluid (prismasate) 300 mL/hr at 08/23/14 1550  . dialysis replacement fluid (prismasate) 200 mL/hr at 08/24/14 0641  . dialysate (PRISMASATE) 2,000 mL/hr at 08/23/14 1550    Assessment/Plan:  Arrest:  See note by Dr Burt Knack.  In setting of poor LV reserve sedation and metabolic abnormality.  QRS is narrow so not  A candidate for BiV  Should be seen by EP and CHF team if he recovers from acute event.  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  Vent and weaning pressors per CCM  Volume management  With dialysis for severe decrease in LV function   Jenkins Rouge 08/24/2014, 9:11 AM

## 2014-08-24 NOTE — Progress Notes (Signed)
Subjective: Kyle [name] is a 59 yo male with PMHx of CKD Stage V and chronic systolic and diastolic CHF (EF 0000000) who was admitted for acute on chronic kidney and heart failure who later went into PEA arrest of unclear etiology after placement of tunneled HD cath. Patient was intubated, resuscitated and place on hypothermia protocol. Patient was seen and examined this morning. He is intubated and sedated, but opens his eyes to voice and follows some commands.   Overnight, there were issues with the subclavian tunneled HD catheter. Unfortunately, patient only received about 2 hours of CRRT. However, HD cath cleared with tPA and is now working well.  Objective: Vital signs in last 24 hours: Filed Vitals:   08/24/14 0800 08/24/14 0805 08/24/14 0900 08/24/14 1000  BP:  130/70    Pulse: 84 86 72 71  Temp: 97.9 F (36.6 C)   97.9 F (36.6 C)  TempSrc: Core (Comment)   Core (Comment)  Resp:  12    Height:      Weight:      SpO2: 100% 100% 100% 100%   General: Vital signs reviewed. Patient is intubated, sedated.  HEENT: Intubated Cardiovascular: RRR, S1 normal, S2 normal Pulmonary/Chest: Inspiratory crackles in lower lung fields, no wheezes, or rhonchi. Abdominal: Nontender, hypoactive BS + Extremities: 2+ woody pitting lower extremity edema bilaterally  Skin: Warm, dry and intact. No rashes or erythema. Neuro: Can follow some commands. Able to move upper extremities. No movement of lower extremities. Able to track better with his eyes today.  Intake/Output from previous day: 07/29 0701 - 07/30 0700 In: 1078.7 [I.V.:530.2; NG/GT:268.5; IV Piggyback:280] Out: 444  Intake/Output this shift: Total I/O In: 90 [I.V.:60; NG/GT:30] Out: 158 [Other:158]  Lab Results:  Recent Labs  08/23/14 0500 08/24/14 0400  WBC 10.1 11.1*  HGB 12.8* 10.0*  HCT 37.8* 30.5*  PLT 366 292   BMET:   Recent Labs  08/23/14 1620 08/24/14 0400  NA 139 139  K 4.2 4.3  CL 107 107  CO2 18* 19*   GLUCOSE 98 139*  BUN 90* 85*  CREATININE 10.04* 9.54*  CALCIUM 6.9* 6.8*   No results for input(s): PTH in the last 72 hours. Iron Studies: No results for input(s): IRON, TIBC, TRANSFERRIN, FERRITIN in the last 72 hours. CBG (last 3)   Recent Labs  08/23/14 2002 08/23/14 2342 08/24/14 0410  GLUCAP 90 115* 133*     Studies/Results: Dg Chest Port 1 View  08/24/2014   CLINICAL DATA:  Respiratory failure  EXAM: PORTABLE CHEST - 1 VIEW  COMPARISON:  08/23/2014  FINDINGS: Layering moderate right pleural effusion. Small left pleural effusion.  Associated bilateral lower lobe opacities, likely atelectasis.  Pulmonary vascular congestion without frank interstitial edema.  Endotracheal tube terminates 5.5 cm above the carina.  The heart is normal in size.  Right IJ dual lumen dialysis catheter with its tip in the right atrium. Left IJ venous catheter with its tip in the mid SVC.  Enteric tube courses below the diaphragm.  IMPRESSION: Pulmonary vascular congestion without frank interstitial edema.  Layering moderate right pleural effusion. Small left pleural effusion.  Associated bilateral lower lobe opacities, likely atelectasis.  Endotracheal tube terminates 5.5 cm above the carina. Additional support apparatus as above.   Electronically Signed   By: Julian Hy M.D.   On: 08/24/2014 08:17   Dg Chest Port 1 View  08/23/2014   CLINICAL DATA:  Respiratory difficulty  EXAM: PORTABLE CHEST - 1 VIEW  COMPARISON:  08/21/2014  FINDINGS: Tubular devices are stable. Normal heart size. Bilateral pleural effusions are stable. Central airspace disease is stable. No pneumothorax.  IMPRESSION: Stable bilateral pleural effusions and bilateral central airspace disease.   Electronically Signed   By: Marybelle Killings M.D.   On: 08/23/2014 07:44    I have reviewed the patient's current medications. Prior to Admission:  Prescriptions prior to admission  Medication Sig Dispense Refill Last Dose  . aspirin 81 MG  chewable tablet Chew 1 tablet (81 mg total) by mouth daily.   08/19/2014 at Unknown time  . calcitRIOL (ROCALTROL) 0.25 MCG capsule Take 1 capsule (0.25 mcg total) by mouth every Monday, Wednesday, and Friday. 30 capsule 1 08/19/2014 at Unknown time  . carvedilol (COREG) 12.5 MG tablet Take 0.5 tablets (6.25 mg total) by mouth 2 (two) times daily with a meal. 60 tablet 1 08/20/2014 at 1000  . ferrous sulfate 325 (65 FE) MG tablet Take 1 tablet (325 mg total) by mouth daily with breakfast. 30 tablet 3 08/20/2014 at Unknown time  . furosemide (LASIX) 80 MG tablet Take 1 tablet (80 mg total) by mouth 3 (three) times daily. 90 tablet 1 08/20/2014 at Unknown time  . glimepiride (AMARYL) 2 MG tablet Take 1 tablet (2 mg total) by mouth daily with breakfast. 30 tablet 1 08/20/2014 at Unknown time  . hydrALAZINE (APRESOLINE) 25 MG tablet Take 1.5 tablets (37.5 mg total) by mouth 3 (three) times daily. (Patient not taking: Reported on 08/20/2014) 90 tablet 0 Not Taking at Unknown time  . isosorbide dinitrate (ISORDIL) 20 MG tablet Take 1 tablet (20 mg total) by mouth 3 (three) times daily. (Patient not taking: Reported on 08/20/2014) 90 tablet 0 Not Taking at Unknown time  . isosorbide-hydrALAZINE (BIDIL) 20-37.5 MG per tablet Take 1 tablet by mouth 3 (three) times daily. (Patient not taking: Reported on 08/20/2014) 90 tablet 1 Not Taking at Unknown time   Scheduled: . antiseptic oral rinse  7 mL Mouth Rinse QID  . artificial tears  1 application Both Eyes 3 times per day  . chlorhexidine  15 mL Mouth Rinse BID  . feeding supplement (NEPRO CARB STEADY)  1,000 mL Per Tube Q24H  . feeding supplement (PRO-STAT SUGAR FREE 64)  60 mL Per Tube 5 X Daily  . heparin  5,000 Units Subcutaneous 3 times per day  . insulin aspart  0-20 Units Subcutaneous 6 times per day  . metoprolol tartrate  50 mg Per Tube BID  . pantoprazole sodium  40 mg Per Tube Q24H  . sodium chloride  3 mL Intravenous Q12H  . vancomycin  1,000 mg  Intravenous Q24H   Continuous: . sodium chloride Stopped (08/24/14 0300)  . fentaNYL infusion INTRAVENOUS 250 mcg/hr (08/24/14 1046)  . norepinephrine (LEVOPHED) Adult infusion 3 mcg/min (08/23/14 0800)  . dialysis replacement fluid (prismasate) 300 mL/hr at 08/23/14 1550  . dialysis replacement fluid (prismasate) 200 mL/hr at 08/24/14 0641  . dialysate (PRISMASATE) 2,000 mL/hr at 08/24/14 0912   [id number] (SUBLIMAZE) injection, heparin, hydrALAZINE, [DISCONTINUED] ondansetron **OR** ondansetron (ZOFRAN) IV, sodium chloride  Assessment/Plan: Acute on Chronic Kidney Disease, Stage V: Likely ESRD. Creatinine >10 on admission and GFR of 5. SCr 9.54 this morning.  -Continue CRRT -Obtain AVF vs. AVG when medically stable -Repeat renal function panel tomorrow am -Strict I/Os  Non-gapped Metabolic Acidosis: Likely secondary to CKD. Bicarb 17-19. -Improve with CRRT  Hyperphosphatemia: Phosphorus 7.6 this morning. -Consider PhosLo  Hypotension: Requiring low dose pressors. Tolerating CRRT.  -On levophed  Anemia of Chronic Disease: Likely secondary to CKD. Hemoglobin 10.0 today, at baseline of 10.  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  -Per Cardiology   Possible Peritonitis: Paracentesis fluid was positive for GPC in clusters. -On Vancomycin  S/p PEA Arrest: Intubated and sedated. Patient does have purposeful movements and follows so commands.  -Per Cardiology/PCCM  DVT/PE ppx: On Heparin TID    LOS: 4 days   Osa Craver, DO PGY-2 Internal Medicine Resident Pager # [phone] 08/24/2014 11:01 AM

## 2014-08-24 NOTE — Progress Notes (Signed)
ANTIBIOTIC CONSULT NOTE - INITIAL  Pharmacy Consult for Zosyn Indication: aspiration PNA  No Known Allergies  Patient Measurements: Height: 6' (182.9 cm) Weight: 234 lb 9.1 oz (106.4 kg) IBW/kg (Calculated) : 77.6 Adjusted Body Weight: n/a  Vital Signs: Temp: 96.3 F (35.7 C) (07/30 1600) Temp Source: Axillary (07/30 1600) BP: 141/86 mmHg (07/30 1605) Pulse Rate: 79 (07/30 1605) Intake/Output from previous day: 07/29 0701 - 07/30 0700 In: 1078.7 [I.V.:530.2; NG/GT:268.5; IV Piggyback:280] Out: 444  Intake/Output from this shift: Total I/O In: 526.2 [I.V.:206.2; NG/GT:320] Out: 1399 [Emesis/NG output:700; Other:699]  Labs:  Recent Labs  08/22/14 0445  08/23/14 0500 08/23/14 0900 08/23/14 1620 08/24/14 0400  WBC 8.4  --  10.1  --   --  11.1*  HGB 12.5*  --  12.8*  --   --  10.0*  PLT 329  --  366  --   --  292  CREATININE 10.24*  < > 10.10*  10.12* 10.13* 10.04* 9.54*  < > = values in this interval not displayed. Estimated Creatinine Clearance: 10.5 mL/min (by C-G formula based on Cr of 9.54). No results for input(s): VANCOTROUGH, VANCOPEAK, VANCORANDOM, GENTTROUGH, GENTPEAK, GENTRANDOM, TOBRATROUGH, TOBRAPEAK, TOBRARND, AMIKACINPEAK, AMIKACINTROU, AMIKACIN in the last 72 hours.   Microbiology:   Medical History: Past Medical History  Diagnosis Date  . Hypertension   . Diabetes mellitus without complication     Assessment: 59 yo male with new copious respiratory secretions, concern for aspiration PNA.  Currently on nafcillin for CoNS in peritoneal fluid.  Spoke with Dr. Oletta Darter, will d/c nafcillin for now and switch to Zosyn for broader coverage.  s/p paracentesis 7/28  WBC 11.1, afebrile  7/28 Vanc>>7/30 7/30 Nafcillin>>  7/27 paracentesis fluid>>GPC>>CoNS pan sensitive except clindamycin and erythromycin - Narrow to nafcillin  Goal of Therapy:  Resolution of infection  Plan:  1. D/c nafcillin. 2. Zosyn 3.375g IV q 6 hrs while on CRRT. 3. F/u  cultures, renal function and LOT.  Uvaldo Rising, BCPS  Clinical Pharmacist Pager [phone]  08/24/2014 5:20 PM

## 2014-08-24 NOTE — Progress Notes (Signed)
1600 pt noted to have vomited. TF stopped and OG to suction. Immediate return of 700 ml light brown fluid. Dr Corinna Lines notified of this and continued difficulty managing patient temperature with arctic sun. Warming blanket placed.  Subsequently pt had bradycardic episode and his sats dropped to the 80's. RT called to assess. Cuff link noted. MDs made aware. Dr Lake Bells reintubated. Pt tolerated well. Sats now 99%.

## 2014-08-24 NOTE — Progress Notes (Signed)
[facility] Progress Note Patient Name: Kyle [name] DOB: 07/08/1955 MRN: [id number]   Date of Service  08/24/2014  HPI/Events of Note  ETT 2.5 cm above the carina.   eICU Interventions  ETT placement acceptable.      Intervention Category Intermediate Interventions: Diagnostic test evaluation  Sommer,Steven Eugene 08/24/2014, 7:21 PM

## 2014-08-25 ENCOUNTER — Inpatient Hospital Stay (HOSPITAL_COMMUNITY): Payer: 59

## 2014-08-25 LAB — RENAL FUNCTION PANEL
ANION GAP: 10 (ref 5–15)
Albumin: 1.6 g/dL — ABNORMAL LOW (ref 3.5–5.0)
BUN: 58 mg/dL — ABNORMAL HIGH (ref 6–20)
CO2: 25 mmol/L (ref 22–32)
Calcium: 7.2 mg/dL — ABNORMAL LOW (ref 8.9–10.3)
Chloride: 102 mmol/L (ref 101–111)
Creatinine, Ser: 5.07 mg/dL — ABNORMAL HIGH (ref 0.61–1.24)
GFR calc non Af Amer: 11 mL/min — ABNORMAL LOW (ref >60)
GFR, EST AFRICAN AMERICAN: 13 mL/min — AB (ref >60)
Glucose, Bld: 105 mg/dL — ABNORMAL HIGH (ref 65–99)
PHOSPHORUS: 4 mg/dL (ref 2.5–4.6)
POTASSIUM: 4.2 mmol/L (ref 3.5–5.1)
SODIUM: 137 mmol/L (ref 135–145)

## 2014-08-25 LAB — COMPREHENSIVE METABOLIC PANEL
ALBUMIN: 1.7 g/dL — AB (ref 3.5–5.0)
ALT: 15 U/L — ABNORMAL LOW (ref 17–63)
ANION GAP: 8 (ref 5–15)
AST: 17 U/L (ref 15–41)
Alkaline Phosphatase: 85 U/L (ref 38–126)
BILIRUBIN TOTAL: 0.5 mg/dL (ref 0.3–1.2)
BUN: 62 mg/dL — AB (ref 6–20)
CO2: 26 mmol/L (ref 22–32)
Calcium: 7.1 mg/dL — ABNORMAL LOW (ref 8.9–10.3)
Chloride: 105 mmol/L (ref 101–111)
Creatinine, Ser: 6.07 mg/dL — ABNORMAL HIGH (ref 0.61–1.24)
GFR calc non Af Amer: 9 mL/min — ABNORMAL LOW (ref >60)
GFR, EST AFRICAN AMERICAN: 11 mL/min — AB (ref >60)
Glucose, Bld: 93 mg/dL (ref 65–99)
Potassium: 4.3 mmol/L (ref 3.5–5.1)
Sodium: 139 mmol/L (ref 135–145)
Total Protein: 5 g/dL — ABNORMAL LOW (ref 6.5–8.1)

## 2014-08-25 LAB — CBC WITH DIFFERENTIAL/PLATELET
Basophils Absolute: 0 10*3/uL (ref 0.0–0.1)
Basophils Relative: 0 % (ref 0–1)
EOS PCT: 0 % (ref 0–5)
Eosinophils Absolute: 0 10*3/uL (ref 0.0–0.7)
HCT: 27.2 % — ABNORMAL LOW (ref 39.0–52.0)
HEMOGLOBIN: 8.8 g/dL — AB (ref 13.0–17.0)
Lymphocytes Relative: 9 % — ABNORMAL LOW (ref 12–46)
Lymphs Abs: 0.8 10*3/uL (ref 0.7–4.0)
MCH: 27 pg (ref 26.0–34.0)
MCHC: 32.4 g/dL (ref 30.0–36.0)
MCV: 83.4 fL (ref 78.0–100.0)
MONO ABS: 0.6 10*3/uL (ref 0.1–1.0)
MONOS PCT: 7 % (ref 3–12)
Neutro Abs: 7.2 10*3/uL (ref 1.7–7.7)
Neutrophils Relative %: 84 % — ABNORMAL HIGH (ref 43–77)
Platelets: 193 10*3/uL (ref 150–400)
RBC: 3.26 MIL/uL — ABNORMAL LOW (ref 4.22–5.81)
RDW: 19.3 % — AB (ref 11.5–15.5)
WBC: 8.7 10*3/uL (ref 4.0–10.5)

## 2014-08-25 LAB — CULTURE, BODY FLUID-BOTTLE

## 2014-08-25 LAB — GLUCOSE, CAPILLARY
GLUCOSE-CAPILLARY: 98 mg/dL (ref 65–99)
Glucose-Capillary: 104 mg/dL — ABNORMAL HIGH (ref 65–99)
Glucose-Capillary: 108 mg/dL — ABNORMAL HIGH (ref 65–99)
Glucose-Capillary: 117 mg/dL — ABNORMAL HIGH (ref 65–99)
Glucose-Capillary: 82 mg/dL (ref 65–99)
Glucose-Capillary: 98 mg/dL (ref 65–99)

## 2014-08-25 LAB — PHOSPHORUS: Phosphorus: 4.9 mg/dL — ABNORMAL HIGH (ref 2.5–4.6)

## 2014-08-25 LAB — BLOOD GAS, ARTERIAL
Acid-base deficit: 1 mmol/L (ref 0.0–2.0)
BICARBONATE: 23.2 meq/L (ref 20.0–24.0)
DRAWN BY: 331761
FIO2: 0.4
LHR: 12 {breaths}/min
O2 SAT: 98.2 %
PATIENT TEMPERATURE: 98.6
PEEP/CPAP: 5 cmH2O
TCO2: 24.4 mmol/L (ref 0–100)
VT: 650 mL
pCO2 arterial: 38.9 mmHg (ref 35.0–45.0)
pH, Arterial: 7.393 (ref 7.350–7.450)
pO2, Arterial: 162 mmHg — ABNORMAL HIGH (ref 80.0–100.0)

## 2014-08-25 LAB — CULTURE, BODY FLUID W GRAM STAIN -BOTTLE

## 2014-08-25 LAB — MAGNESIUM: MAGNESIUM: 1.9 mg/dL (ref 1.7–2.4)

## 2014-08-25 MED ORDER — MIDAZOLAM HCL 2 MG/2ML IJ SOLN
2.0000 mg | INTRAMUSCULAR | Status: DC | PRN
  Administered 2014-08-25 – 2014-08-26 (×2): 2 mg via INTRAVENOUS
  Filled 2014-08-25 (×2): qty 2

## 2014-08-25 NOTE — Progress Notes (Signed)
PULMONARY / CRITICAL CARE MEDICINE   Name: Kyle [name] MRN: [id number] DOB: 02/28/1955    ADMISSION DATE:  08/20/2014 CONSULTATION DATE:  08/20/2014  REFERRING MD :  Ree Kida   CHIEF COMPLAINT:  PEA arrest    INITIAL PRESENTATION:   76 with hx of ESRD presented for tunneled HD catheter.  Developed bradycardic PEA arrest >> about 10 minutes for ROSC.  Hx of systolic CHF with baseline EF 15%.  STUDIES:  7/27 paracentesis 7/28 Echo >> EF 10%  SIGNIFICANT EVENTS: 7/27 Cardiac arrest, intubated and to ICU, hypothermia 7/29 rewarmed 7/30 more agitated  SUBJECTIVE:  Vomiting noted 7/30 Zosyn added Cuff blew, ETT changed Neg 1400 cc  VITAL SIGNS: Temp:  [95.9 F (35.5 C)-98.2 F (36.8 C)] 97.3 F (36.3 C) (07/31 0800) Pulse Rate:  [67-87] 81 (07/31 0906) Resp:  [10-25] 12 (07/31 0906) BP: (94-168)/(56-95) 168/83 mmHg (07/31 0906) SpO2:  [96 %-100 %] 100 % (07/31 0906) Arterial Line BP: (160)/(81) 160/81 mmHg (07/30 1100) FiO2 (%):  [30 %-40 %] 40 % (07/31 0821) Weight:  [106 kg (233 lb 11 oz)] 106 kg (233 lb 11 oz) (07/31 0326)   HEMODYNAMICS: CVP:  [8 mmHg-17 mmHg] 8 mmHg  VENTILATOR SETTINGS: Vent Mode:  [-] PRVC FiO2 (%):  [30 %-40 %] 40 % Set Rate:  [12 bmp] 12 bmp Vt Set:  [638 mL] 620 mL PEEP:  [5 cmH20] 5 cmH20 Pressure Support:  [5 cmH20] 5 cmH20 Plateau Pressure:  [23 cmH20-33 cmH20] 23 cmH20   INTAKE / OUTPUT:  Intake/Output Summary (Last 24 hours) at 08/25/14 1003 Last data filed at 08/25/14 0900  Gross per 24 hour  Intake 1437.3 ml  Output   2942 ml  Net -1504.7 ml   PHYSICAL EXAMINATION: General: intubated, int follows commands Neuro: localizes to voice, moving extremities equally, follows commands intermittent HEENT: pupils reactive, rt subclav clean tunneled  Cardiovascular: s1 s2  regularr Lungs: ronchi Abdomen:  Soft, non tender, smal fluid wave Musculoskeletal: 2+ edema  LABS:  CBC  Recent Labs Lab 08/23/14 0500 08/24/14 0400  08/25/14 0430  WBC 10.1 11.1* 8.7  HGB 12.8* 10.0* 8.8*  HCT 37.8* 30.5* 27.2*  PLT 366 292 193   Coag's  Recent Labs Lab 08/20/14 2058 08/21/14 1700 08/22/14 0115  APTT 36 34 35  INR 1.28 1.30 1.29   BMET  Recent Labs Lab 08/24/14 0400 08/24/14 1642 08/25/14 0430  NA 139 139 139  K 4.3 4.6 4.3  CL 107 104 105  CO2 19* 22 26  BUN 85* 73* 62*  CREATININE 9.54* 7.47* 6.07*  GLUCOSE 139* 122* 93   Electrolytes  Recent Labs Lab 08/23/14 0500  08/24/14 0400 08/24/14 1642 08/25/14 0430  CALCIUM 6.8*  6.8*  < > 6.8* 7.1* 7.1*  MG 1.6*  --  1.7  --  1.9  PHOS 6.3*  6.3*  < > 7.6* 6.0* 4.9*  < > = values in this interval not displayed. Sepsis Markers  Recent Labs Lab 08/21/14 1656 08/21/14 1700 08/21/14 2050  LATICACIDVEN 1.51 1.7 1.0   ABG  Recent Labs Lab 08/22/14 0600 08/22/14 0850 08/23/14 0342  PHART 7.522* 7.454* 7.440  PCO2ART 18.5* 24.1* 25.0*  PO2ART 210* 172* 123.0*   Liver Enzymes  Recent Labs Lab 08/21/14 0802 08/21/14 1700  08/24/14 0400 08/24/14 1642 08/25/14 0430  AST 24 31  --   --   --  17  ALT 38 40  --   --   --  15*  ALKPHOS 130* 116  --   --   --  85  BILITOT 0.5 0.6  --   --   --  0.5  ALBUMIN 1.9* 1.8*  < > 1.7* 1.8* 1.7*  < > = values in this interval not displayed. Cardiac Enzymes  Recent Labs Lab 08/21/14 1700 08/21/14 2050 08/22/14 0445  TROPONINI 0.06* 0.17* 0.15*   Glucose  Recent Labs Lab 08/24/14 1144 08/24/14 1814 08/24/14 2005 08/25/14 0037 08/25/14 0435 08/25/14 0720  GLUCAP 114* 113* 121* 108* 82 98    Imaging Dg Chest Port 1 View  08/25/2014   CLINICAL DATA:  Cardiac arrest  EXAM: PORTABLE CHEST - 1 VIEW  COMPARISON:  Radiograph 08/24/2014  FINDINGS: Endotracheal tube is approximately 1 cm from the carina. LEFT central venous line and NG tube are unchanged. There are low lung volumes and basilar atelectasis. Small effusions noted. Central venous congestion present. No pneumothorax.   IMPRESSION: 1. Endotracheal tube is relatively low approximately 1 cm from carina. No significant change. 2. Low lung volumes, bibasilar atelectasis and small effusions.   Electronically Signed   By: Suzy Bouchard M.D.   On: 08/25/2014 09:09   Dg Chest Port 1 View  08/24/2014   CLINICAL DATA:  Acute respiratory failure, hypoxia. Exchange of endotracheal tube.  EXAM: PORTABLE CHEST - 1 VIEW  COMPARISON:  08/24/2014  FINDINGS: Endotracheal tube is 2.5 cm above the carina. Left central line and right dialysis catheter is well is NG tube are unchanged.  Bilateral perihilar and lower lobe opacities with layering effusions, unchanged.  IMPRESSION: Endotracheal tube 2.5 cm above the carina. Otherwise no significant change.   Electronically Signed   By: Rolm Baptise M.D.   On: 08/24/2014 18:29     ASSESSMENT / PLAN:  PULMONARY ETT 7/27>>> A: Acute respiratory failure after cardiac arrest.pleural effusion rt?, pulm edema, gross overlaod P:   Full vent support until mental status improved F/u CXR with neg balance Wean sbt cpap 5 ps 5, failed, apnic, get abg, ensure not alkalotic Need neg balance to remain, was 1.4 neg, can tolerate more  CARDIOVASCULAR Rt IJ HD cath 7/27 >>  Lt IJ CVL 7/27 >> Rt femoral aline 7/27 >> A: PEA cardiac arrest. Acute on chronic combined CHF. Hx of HTN. P:  PRN hydralazine Cardiology following Could consider HD BB  RENAL A: ESRD. Non gap metabolic acidosis. P:   cvvhd Neg balance goals met, consider HD with BP noted Chem in am   GASTROINTESTINAL A: Nutrition. Ascites. Vomited 7/30, r;o ileus P:   Tube feeds, advance to goal met again at 10  KUB assessment Protonix for SUP Neg balance needed  HEMATOLOGIC A: Anemia of chronic disease. Some loss blood from cvvhd lack of return to filter P:  F/u CBC SQ heparin for DVT prevention  INFECTIOUS A: Possible SBP >> GPC in clusters in peritoneal fluid, concern asp 7/30 P:   vancomycin    Peritoneal fluid 7/27 >> GPC in clusters >>coag neg staph  Zosyn 7/30>>>  likely can go back to nafcillin in 48 hr  ENDOCRINE A: Hx of DM. Episodes of hypoglycemia. P:   SSI   NEUROLOGIC A: Acute encephalopathy after cardiac arrest >> some optimistic findings on neuro exam P:   Monitor mental status RASS goal 0 PRN fentanyl WUA   CC time 30 minutes.  Lavon Paganini. Titus Mould, MD, [place] Pgr: [facility]

## 2014-08-25 NOTE — Progress Notes (Signed)
Patient ID: Kyle [name], male   DOB: 07-15-1955, 59 y.o.   MRN: [id number]    Subjective:  Intubated with warmer and on dialysis.  Nurse indicates some purposeful movement and nodding of head   Objective:  Filed Vitals:   08/25/14 0500 08/25/14 0600 08/25/14 0700 08/25/14 0800  BP: 133/75 109/56 144/82 141/86  Pulse: 74 67 76 72  Temp:  97.3 F (36.3 C) 96.8 F (36 C) 97.3 F (36.3 C)  TempSrc:  Core (Comment) Core (Comment) Core (Comment)  Resp: 12 12 12 12   Height:      Weight:      SpO2: 100% 100% 100% 100%    Intake/Output from previous day:  Intake/Output Summary (Last 24 hours) at 08/25/14 0813 Last data filed at 08/25/14 0800  Gross per 24 hour  Intake 1462.3 ml  Output   2913 ml  Net -1450.7 ml    Physical Exam: Intubated  Chronically ill black male  HEENT: normal Neck supple with no adenopathy  Right Ditech catheter  JVP normal no bruits no thyromegaly Lungs clear with no wheezing and good diaphragmatic motion Heart:  S1/S2 SEM  murmur, no rub, gallop or click PMI normal Abdomen: benighn, BS positve, no tenderness, no AAA no bruit.  No HSM or HJR Distal pulses intact with no bruits Bilateral edema Neuro non-focal agitated  Skin warm and dry    Lab Results: Basic Metabolic Panel:  Recent Labs  08/24/14 0400 08/24/14 1642 08/25/14 0430  NA 139 139 139  K 4.3 4.6 4.3  CL 107 104 105  CO2 19* 22 26  GLUCOSE 139* 122* 93  BUN 85* 73* 62*  CREATININE 9.54* 7.47* 6.07*  CALCIUM 6.8* 7.1* 7.1*  MG 1.7  --  1.9  PHOS 7.6* 6.0* 4.9*   Liver Function Tests:  Recent Labs  08/24/14 1642 08/25/14 0430  AST  --  17  ALT  --  15*  ALKPHOS  --  85  BILITOT  --  0.5  PROT  --  5.0*  ALBUMIN 1.8* 1.7*   No results for input(s): LIPASE, AMYLASE in the last 72 hours. CBC:  Recent Labs  08/24/14 0400 08/25/14 0430  WBC 11.1* 8.7  NEUTROABS  --  7.2  HGB 10.0* 8.8*  HCT 30.5* 27.2*  MCV 81.3 83.4  PLT 292 193   Cardiac Enzymes: No  results for input(s): CKTOTAL, CKMB, CKMBINDEX, TROPONINI in the last 72 hours.  Imaging: Dg Chest Port 1 View  08/24/2014   CLINICAL DATA:  Acute respiratory failure, hypoxia. Exchange of endotracheal tube.  EXAM: PORTABLE CHEST - 1 VIEW  COMPARISON:  08/24/2014  FINDINGS: Endotracheal tube is 2.5 cm above the carina. Left central line and right dialysis catheter is well is NG tube are unchanged.  Bilateral perihilar and lower lobe opacities with layering effusions, unchanged.  IMPRESSION: Endotracheal tube 2.5 cm above the carina. Otherwise no significant change.   Electronically Signed   By: Rolm Baptise M.D.   On: 08/24/2014 18:29   Dg Chest Port 1 View  08/24/2014   CLINICAL DATA:  Respiratory failure  EXAM: PORTABLE CHEST - 1 VIEW  COMPARISON:  08/23/2014  FINDINGS: Layering moderate right pleural effusion. Small left pleural effusion.  Associated bilateral lower lobe opacities, likely atelectasis.  Pulmonary vascular congestion without frank interstitial edema.  Endotracheal tube terminates 5.5 cm above the carina.  The heart is normal in size.  Right IJ dual lumen dialysis catheter with its tip in the right  atrium. Left IJ venous catheter with its tip in the mid SVC.  Enteric tube courses below the diaphragm.  IMPRESSION: Pulmonary vascular congestion without frank interstitial edema.  Layering moderate right pleural effusion. Small left pleural effusion.  Associated bilateral lower lobe opacities, likely atelectasis.  Endotracheal tube terminates 5.5 cm above the carina. Additional support apparatus as above.   Electronically Signed   By: Julian Hy M.D.   On: 08/24/2014 08:17    Cardiac Studies:  ECG:   SR rate 54 QT 551  7/28   Telemetry:  NSR  No bradycardia or VT  08/25/2014   Echo:  Severe LVH EF 10%    Medications:   . antiseptic oral rinse  7 mL Mouth Rinse QID  . chlorhexidine  15 mL Mouth Rinse BID  . feeding supplement (NEPRO CARB STEADY)  1,000 mL Per Tube Q24H  .  feeding supplement (PRO-STAT SUGAR FREE 64)  60 mL Per Tube 5 X Daily  . heparin  5,000 Units Subcutaneous 3 times per day  . insulin aspart  0-20 Units Subcutaneous 6 times per day  . metoprolol tartrate  50 mg Per Tube BID  . pantoprazole sodium  40 mg Per Tube Q24H  . piperacillin-tazobactam  3.375 g Intravenous 4 times per day  . sodium chloride  3 mL Intravenous Q12H     . sodium chloride Stopped (08/24/14 0300)  . fentaNYL infusion INTRAVENOUS 150 mcg/hr (08/25/14 0749)  . norepinephrine (LEVOPHED) Adult infusion 3 mcg/min (08/23/14 0800)  . dialysis replacement fluid (prismasate) 300 mL/hr at 08/23/14 1550  . dialysis replacement fluid (prismasate) 200 mL/hr at 08/24/14 2249  . dialysate (PRISMASATE) 2,000 mL/hr at 08/25/14 [id number]    Assessment/Plan:  Arrest:  See note by Dr Burt Knack.  In setting of poor LV reserve sedation and metabolic abnormality.  QRS is narrow so not  A candidate for BiV  Should be seen by EP and CHF team if he recovers from acute event.  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  Vent and weaning pressors per CCM  Volume management  With dialysis for severe decrease in LV function I/O - 1.4 Liters    Jenkins Rouge 08/25/2014, 8:13 AM

## 2014-08-25 NOTE — Progress Notes (Signed)
Pt temperature goal of 37 not met despite use of CRRT blood warmer and [employer]. Dr Titus Mould notified. Temp goal changed to 36.5. Pt skin assessed. No changes in skin color or integrity d/t pads noted.

## 2014-08-25 NOTE — Progress Notes (Signed)
Subjective: Kyle [name] is a 59 yo male with PMHx of CKD Stage V and chronic systolic and diastolic CHF (EF 0000000) who was admitted for acute on chronic kidney and heart failure who later went into PEA arrest of unclear etiology after placement of tunneled HD cath. Patient was intubated, resuscitated and place on hypothermia protocol. Patient was seen and examined this morning. He remains intubated, seen during wake up assessment, still slightly sedated, not following commands but moving upper extremities.   Objective: Vital signs in last 24 hours: Filed Vitals:   08/25/14 0800 08/25/14 0821 08/25/14 0906 08/25/14 1003  BP: 141/86 141/86 168/83 174/94  Pulse: 72 74 81 91  Temp: 97.3 F (36.3 C)   94.1 F (34.5 C)  TempSrc: Core (Comment)   Core (Comment)  Resp: 12 12 12 1   Height:      Weight:      SpO2: 100% 100% 100% 100%   General: Vital signs reviewed. Patient is intubated, sedated.  HEENT: Intubated Cardiovascular: RRR, S1 normal, S2 normal Pulmonary/Chest: CTA b/l, no wheezes, or rhonchi. Abdominal: Nontender, hypoactive BS + Extremities: 1+ woody pitting lower extremity edema bilaterally  Skin: Warm, dry and intact. No rashes or erythema. Neuro: Moving upper extremities.  Intake/Output from previous day: 07/30 0701 - 07/31 0700 In: 1415.1 [I.V.:635.1; NG/GT:630; IV Piggyback:150] Out: 2870 [Emesis/NG output:700] Intake/Output this shift: Total I/O In: 384.3 [I.V.:129.3; NG/GT:255] Out: 333 [Other:333]  Lab Results:  Recent Labs  08/24/14 0400 08/25/14 0430  WBC 11.1* 8.7  HGB 10.0* 8.8*  HCT 30.5* 27.2*  PLT 292 193   BMET:   Recent Labs  08/24/14 1642 08/25/14 0430  NA 139 139  K 4.6 4.3  CL 104 105  CO2 22 26  GLUCOSE 122* 93  BUN 73* 62*  CREATININE 7.47* 6.07*  CALCIUM 7.1* 7.1*   CBG (last 3)   Recent Labs  08/25/14 0037 08/25/14 0435 08/25/14 0720  GLUCAP 108* 82 98   Studies/Results: Dg Chest Port 1 View  08/25/2014   CLINICAL DATA:   Cardiac arrest  EXAM: PORTABLE CHEST - 1 VIEW  COMPARISON:  Radiograph 08/24/2014  FINDINGS: Endotracheal tube is approximately 1 cm from the carina. LEFT central venous line and NG tube are unchanged. There are low lung volumes and basilar atelectasis. Small effusions noted. Central venous congestion present. No pneumothorax.  IMPRESSION: 1. Endotracheal tube is relatively low approximately 1 cm from carina. No significant change. 2. Low lung volumes, bibasilar atelectasis and small effusions.   Electronically Signed   By: Suzy Bouchard M.D.   On: 08/25/2014 09:09   Dg Chest Port 1 View  08/24/2014   CLINICAL DATA:  Acute respiratory failure, hypoxia. Exchange of endotracheal tube.  EXAM: PORTABLE CHEST - 1 VIEW  COMPARISON:  08/24/2014  FINDINGS: Endotracheal tube is 2.5 cm above the carina. Left central line and right dialysis catheter is well is NG tube are unchanged.  Bilateral perihilar and lower lobe opacities with layering effusions, unchanged.  IMPRESSION: Endotracheal tube 2.5 cm above the carina. Otherwise no significant change.   Electronically Signed   By: Rolm Baptise M.D.   On: 08/24/2014 18:29   Dg Chest Port 1 View  08/24/2014   CLINICAL DATA:  Respiratory failure  EXAM: PORTABLE CHEST - 1 VIEW  COMPARISON:  08/23/2014  FINDINGS: Layering moderate right pleural effusion. Small left pleural effusion.  Associated bilateral lower lobe opacities, likely atelectasis.  Pulmonary vascular congestion without frank interstitial edema.  Endotracheal tube terminates 5.5  cm above the carina.  The heart is normal in size.  Right IJ dual lumen dialysis catheter with its tip in the right atrium. Left IJ venous catheter with its tip in the mid SVC.  Enteric tube courses below the diaphragm.  IMPRESSION: Pulmonary vascular congestion without frank interstitial edema.  Layering moderate right pleural effusion. Small left pleural effusion.  Associated bilateral lower lobe opacities, likely atelectasis.   Endotracheal tube terminates 5.5 cm above the carina. Additional support apparatus as above.   Electronically Signed   By: Julian Hy M.D.   On: 08/24/2014 08:17   I have reviewed the patient's current medications. Prior to Admission:  Prescriptions prior to admission  Medication Sig Dispense Refill Last Dose  . aspirin 81 MG chewable tablet Chew 1 tablet (81 mg total) by mouth daily.   08/19/2014 at Unknown time  . calcitRIOL (ROCALTROL) 0.25 MCG capsule Take 1 capsule (0.25 mcg total) by mouth every Monday, Wednesday, and Friday. 30 capsule 1 08/19/2014 at Unknown time  . carvedilol (COREG) 12.5 MG tablet Take 0.5 tablets (6.25 mg total) by mouth 2 (two) times daily with a meal. 60 tablet 1 08/20/2014 at 1000  . ferrous sulfate 325 (65 FE) MG tablet Take 1 tablet (325 mg total) by mouth daily with breakfast. 30 tablet 3 08/20/2014 at Unknown time  . furosemide (LASIX) 80 MG tablet Take 1 tablet (80 mg total) by mouth 3 (three) times daily. 90 tablet 1 08/20/2014 at Unknown time  . glimepiride (AMARYL) 2 MG tablet Take 1 tablet (2 mg total) by mouth daily with breakfast. 30 tablet 1 08/20/2014 at Unknown time  . hydrALAZINE (APRESOLINE) 25 MG tablet Take 1.5 tablets (37.5 mg total) by mouth 3 (three) times daily. (Patient not taking: Reported on 08/20/2014) 90 tablet 0 Not Taking at Unknown time  . isosorbide dinitrate (ISORDIL) 20 MG tablet Take 1 tablet (20 mg total) by mouth 3 (three) times daily. (Patient not taking: Reported on 08/20/2014) 90 tablet 0 Not Taking at Unknown time  . isosorbide-hydrALAZINE (BIDIL) 20-37.5 MG per tablet Take 1 tablet by mouth 3 (three) times daily. (Patient not taking: Reported on 08/20/2014) 90 tablet 1 Not Taking at Unknown time   Scheduled: . antiseptic oral rinse  7 mL Mouth Rinse QID  . chlorhexidine  15 mL Mouth Rinse BID  . feeding supplement (NEPRO CARB STEADY)  1,000 mL Per Tube Q24H  . feeding supplement (PRO-STAT SUGAR FREE 64)  60 mL Per Tube 5 X  Daily  . heparin  5,000 Units Subcutaneous 3 times per day  . insulin aspart  0-20 Units Subcutaneous 6 times per day  . metoprolol tartrate  50 mg Per Tube BID  . pantoprazole sodium  40 mg Per Tube Q24H  . piperacillin-tazobactam  3.375 g Intravenous 4 times per day  . sodium chloride  3 mL Intravenous Q12H   Continuous: . sodium chloride Stopped (08/24/14 0300)  . fentaNYL infusion INTRAVENOUS 300 mcg/hr (08/25/14 0912)  . dialysis replacement fluid (prismasate) 300 mL/hr at 08/23/14 1550  . dialysis replacement fluid (prismasate) 200 mL/hr at 08/25/14 1015  . dialysate (PRISMASATE) 2,000 mL/hr at 08/25/14 [id number]   [id number], fentaNYL (SUBLIMAZE) injection, heparin, hydrALAZINE, midazolam, [DISCONTINUED] ondansetron **OR** ondansetron (ZOFRAN) IV, sodium chloride  Assessment/Plan: Acute on Chronic Kidney Disease, Stage V: Likely ESRD. Creatinine >10 on admission and GFR of 5. SCr 6.07 this morning while on CRRT. No urine output was recorded. -Continue CRRT -Obtain AVF vs. AVG when medically stable -Repeat  renal function panel tomorrow am -Strict I/Os  Non-gapped Metabolic Acidosis: Likely secondary to CKD. Improved with CRRT, bicarb 26. -Continue CRRT  Hyperphosphatemia: Improved, phosphorus 4.9 this morning. -RFP tomorrow morning  Hypotension: Improved, 144/82 this morning, off pressors. Tolerating CRRT.  -Monitor   Anemia of Chronic Disease: Likely secondary to CKD. Hemoglobin 8.8 today, baseline of 10.  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  -Per Cardiology   Possible Spontaneous Bacterial Peritonitis: Paracentesis fluid was positive for GPC in clusters> coagulase negative staph. -On Zosyn  S/p PEA Arrest: Intubated and sedated. Patient does have purposeful movements and follows so commands.   -Per Cardiology/PCCM  DVT/PE ppx: On Heparin TID    LOS: 5 days   Osa Craver, DO PGY-2 Internal Medicine Resident Pager # [phone] 08/25/2014 10:36 AM

## 2014-08-25 NOTE — Progress Notes (Signed)
E-link notified of pt sustained SBP >160, DBP 90-100 range.

## 2014-08-26 ENCOUNTER — Inpatient Hospital Stay (HOSPITAL_COMMUNITY): Payer: 59

## 2014-08-26 DIAGNOSIS — R188 Other ascites: Secondary | ICD-10-CM

## 2014-08-26 DIAGNOSIS — J9601 Acute respiratory failure with hypoxia: Secondary | ICD-10-CM

## 2014-08-26 LAB — CBC WITH DIFFERENTIAL/PLATELET
BASOS ABS: 0 10*3/uL (ref 0.0–0.1)
Basophils Relative: 0 % (ref 0–1)
EOS ABS: 0.1 10*3/uL (ref 0.0–0.7)
Eosinophils Relative: 1 % (ref 0–5)
HCT: 26.3 % — ABNORMAL LOW (ref 39.0–52.0)
HEMOGLOBIN: 8.6 g/dL — AB (ref 13.0–17.0)
LYMPHS PCT: 10 % — AB (ref 12–46)
Lymphs Abs: 0.8 10*3/uL (ref 0.7–4.0)
MCH: 27.3 pg (ref 26.0–34.0)
MCHC: 32.7 g/dL (ref 30.0–36.0)
MCV: 83.5 fL (ref 78.0–100.0)
MONO ABS: 0.6 10*3/uL (ref 0.1–1.0)
Monocytes Relative: 8 % (ref 3–12)
Neutro Abs: 6 10*3/uL (ref 1.7–7.7)
Neutrophils Relative %: 81 % — ABNORMAL HIGH (ref 43–77)
PLATELETS: 174 10*3/uL (ref 150–400)
RBC: 3.15 MIL/uL — ABNORMAL LOW (ref 4.22–5.81)
RDW: 19.3 % — ABNORMAL HIGH (ref 11.5–15.5)
WBC: 7.5 10*3/uL (ref 4.0–10.5)

## 2014-08-26 LAB — GLUCOSE, CAPILLARY
GLUCOSE-CAPILLARY: 110 mg/dL — AB (ref 65–99)
GLUCOSE-CAPILLARY: 138 mg/dL — AB (ref 65–99)
GLUCOSE-CAPILLARY: 144 mg/dL — AB (ref 65–99)
Glucose-Capillary: 132 mg/dL — ABNORMAL HIGH (ref 65–99)
Glucose-Capillary: 120 mg/dL — ABNORMAL HIGH (ref 65–99)
Glucose-Capillary: 116 mg/dL — ABNORMAL HIGH (ref 65–99)

## 2014-08-26 LAB — RENAL FUNCTION PANEL
Albumin: 1.8 g/dL — ABNORMAL LOW (ref 3.5–5.0)
Anion gap: 8 (ref 5–15)
BUN: 47 mg/dL — AB (ref 6–20)
CHLORIDE: 102 mmol/L (ref 101–111)
CO2: 26 mmol/L (ref 22–32)
Calcium: 7.4 mg/dL — ABNORMAL LOW (ref 8.9–10.3)
Creatinine, Ser: 3.74 mg/dL — ABNORMAL HIGH (ref 0.61–1.24)
GFR calc Af Amer: 19 mL/min — ABNORMAL LOW (ref >60)
GFR, EST NON AFRICAN AMERICAN: 16 mL/min — AB (ref >60)
GLUCOSE: 142 mg/dL — AB (ref 65–99)
Phosphorus: 3.1 mg/dL (ref 2.5–4.6)
Potassium: 4.2 mmol/L (ref 3.5–5.1)
Sodium: 136 mmol/L (ref 135–145)

## 2014-08-26 LAB — COMPREHENSIVE METABOLIC PANEL
ALT: 14 U/L — ABNORMAL LOW (ref 17–63)
AST: 20 U/L (ref 15–41)
Albumin: 1.7 g/dL — ABNORMAL LOW (ref 3.5–5.0)
Alkaline Phosphatase: 77 U/L (ref 38–126)
Anion gap: 5 (ref 5–15)
BILIRUBIN TOTAL: 0.4 mg/dL (ref 0.3–1.2)
BUN: 51 mg/dL — AB (ref 6–20)
CALCIUM: 7.2 mg/dL — AB (ref 8.9–10.3)
CHLORIDE: 105 mmol/L (ref 101–111)
CO2: 26 mmol/L (ref 22–32)
CREATININE: 4.44 mg/dL — AB (ref 0.61–1.24)
GFR calc Af Amer: 15 mL/min — ABNORMAL LOW (ref >60)
GFR calc non Af Amer: 13 mL/min — ABNORMAL LOW (ref >60)
Glucose, Bld: 122 mg/dL — ABNORMAL HIGH (ref 65–99)
Potassium: 4 mmol/L (ref 3.5–5.1)
Sodium: 136 mmol/L (ref 135–145)
Total Protein: 5.1 g/dL — ABNORMAL LOW (ref 6.5–8.1)

## 2014-08-26 LAB — MAGNESIUM: MAGNESIUM: 2 mg/dL (ref 1.7–2.4)

## 2014-08-26 MED ORDER — DARBEPOETIN ALFA 150 MCG/0.3ML IJ SOSY
150.0000 ug | PREFILLED_SYRINGE | INTRAMUSCULAR | Status: DC
  Administered 2014-08-27: 150 ug via SUBCUTANEOUS
  Filled 2014-08-26 (×2): qty 0.3

## 2014-08-26 MED ORDER — CETYLPYRIDINIUM CHLORIDE 0.05 % MT LIQD
7.0000 mL | OROMUCOSAL | Status: DC
  Administered 2014-08-26 – 2014-08-28 (×20): 7 mL via OROMUCOSAL

## 2014-08-26 MED ORDER — SODIUM CHLORIDE 0.9 % IJ SOLN
10.0000 mL | Freq: Two times a day (BID) | INTRAMUSCULAR | Status: DC
  Administered 2014-08-26 – 2014-08-31 (×10): 10 mL
  Administered 2014-09-01: 3 mL
  Administered 2014-09-01 – 2014-09-02 (×2): 10 mL
  Administered 2014-09-02: 3 mL
  Administered 2014-09-03: 10 mL
  Administered 2014-09-03: 3 mL
  Administered 2014-09-06: 10 mL

## 2014-08-26 MED ORDER — SODIUM CHLORIDE 0.9 % IJ SOLN: 10.0000 mL | INTRAMUSCULAR | Status: DC | PRN

## 2014-08-26 NOTE — Progress Notes (Signed)
PULMONARY / CRITICAL CARE MEDICINE   Name: Kyle [name] MRN: [id number] DOB: 06-06-1955    ADMISSION DATE:  08/20/2014 CONSULTATION DATE:  08/20/2014  REFERRING MD :  Kyle [name]   CHIEF COMPLAINT:  PEA arrest    INITIAL PRESENTATION:   85 with hx of ESRD presented for tunneled HD catheter.  Developed bradycardic PEA arrest >> about 10 minutes for ROSC.  Hx of systolic CHF with baseline EF 15%.  STUDIES:  7/27 paracentesis 7/28 Echo >> EF 10%  SIGNIFICANT EVENTS: 7/27 Cardiac arrest, intubated and to ICU, hypothermia 7/29 rewarmed 7/30 more agitated  SUBJECTIVE:  No events overnight. Patient did have BM yesterday. Did have agitation while fentanyl drip was held which had to be restarted. Failed spontaneous breathing trial this morning with poor minute ventilation was shallow tidal breathing. Patient nods "no" to any pain or difficulty breathing.  ROS: Unobtainable as the patient is currently intubated.  VITAL SIGNS: Temp:  [95.4 F (35.2 C)-97.7 F (36.5 C)] 97.7 F (36.5 C) (08/01 0722) Pulse Rate:  [60-102] 102 (08/01 1013) Resp:  [0-21] 12 (08/01 1000) BP: (99-182)/(56-112) 174/106 mmHg (08/01 1013) SpO2:  [99 %-100 %] 100 % (08/01 1000) FiO2 (%):  [30 %-40 %] 40 % (08/01 1000) Weight:  [231 lb 11.3 oz (105.1 kg)] 231 lb 11.3 oz (105.1 kg) (08/01 0500)   HEMODYNAMICS: CVP:  [7 mmHg-13 mmHg] 10 mmHg  VENTILATOR SETTINGS: Vent Mode:  [-] PRVC FiO2 (%):  [30 %-40 %] 40 % Set Rate:  [12 bmp] 12 bmp Vt Set:  [620 mL] 620 mL PEEP:  [5 cmH20] 5 cmH20 Plateau Pressure:  [22 cmH20-27 cmH20] 26 cmH20   INTAKE / OUTPUT:  Intake/Output Summary (Last 24 hours) at 08/26/14 1054 Last data filed at 08/26/14 1013  Gross per 24 hour  Intake 1738.75 ml  Output   3163 ml  Net -1424.25 ml   PHYSICAL EXAMINATION: General:  Awake. No acute distress. No family at bedside.  Integument:  Warm & dry. No rash on exposed skin. No bruising. Right chest wall tunneled dialysis  access. HEENT:  Moist mucus membranes. No scleral injection or icterus. Endotracheal tube in place. PERRL. Cardiovascular:  Regular rate. Anasarca. Unable to appreciate JVD.  Pulmonary:  Decreased breath sounds bilateral lung bases. Symmetric chest wall rise. No accessory muscle use on ventilator. Abdomen: Soft. Normal bowel sounds. Nondistended.  Neurological:  Patient is nodding to questions. Does commands when toes on command as well as giving thumbs up on appropriate hand.  LABS:  CBC  Recent Labs Lab 08/24/14 0400 08/25/14 0430 08/26/14 0420  WBC 11.1* 8.7 7.5  HGB 10.0* 8.8* 8.6*  HCT 30.5* 27.2* 26.3*  PLT 292 193 174   Coag's  Recent Labs Lab 08/20/14 2058 08/21/14 1700 08/22/14 0115  APTT 36 34 35  INR 1.28 1.30 1.29   BMET  Recent Labs Lab 08/25/14 0430 08/25/14 1600 08/26/14 0420  NA 139 137 136  K 4.3 4.2 4.0  CL 105 102 105  CO2 26 25 26   BUN 62* 58* 51*  CREATININE 6.07* 5.07* 4.44*  GLUCOSE 93 105* 122*   Electrolytes  Recent Labs Lab 08/24/14 0400 08/24/14 1642 08/25/14 0430 08/25/14 1600 08/26/14 0420  CALCIUM 6.8* 7.1* 7.1* 7.2* 7.2*  MG 1.7  --  1.9  --  2.0  PHOS 7.6* 6.0* 4.9* 4.0  --    Sepsis Markers  Recent Labs Lab 08/21/14 1656 08/21/14 1700 08/21/14 2050  LATICACIDVEN 1.51 1.7 1.0   ABG  Recent Labs Lab 08/22/14 0850 08/23/14 0342 08/25/14 1045  PHART 7.454* 7.440 7.393  PCO2ART 24.1* 25.0* 38.9  PO2ART 172* 123.0* 162*   Liver Enzymes  Recent Labs Lab 08/21/14 1700  08/25/14 0430 08/25/14 1600 08/26/14 0420  AST 31  --  17  --  20  ALT 40  --  15*  --  14*  ALKPHOS 116  --  85  --  77  BILITOT 0.6  --  0.5  --  0.4  ALBUMIN 1.8*  < > 1.7* 1.6* 1.7*  < > = values in this interval not displayed. Cardiac Enzymes  Recent Labs Lab 08/21/14 1700 08/21/14 2050 08/22/14 0445  TROPONINI 0.06* 0.17* 0.15*   Glucose  Recent Labs Lab 08/25/14 1113 08/25/14 1546 08/25/14 1953 08/25/14 2348  08/26/14 0417 08/26/14 0719  GLUCAP 104* 98 117* 144* 110* 132*    Imaging Dg Chest Port 1 View  08/26/2014   CLINICAL DATA:  CHF, acute respiratory failure, anasarca  EXAM: PORTABLE CHEST - 1 VIEW  COMPARISON:  Portable chest x-ray of August 25, 2014  FINDINGS: The lungs remain hypoinflated. There are persistent bilateral pleural effusions as well bibasilar atelectasis or infiltrate. There is no pneumothorax. The cardiac silhouette is largely obscured. The central pulmonary vascularity is more distinct today. The endotracheal tube tip lies 2 cm above the carina. The esophagogastric tube tip projects below the inferior margin of the image. The left internal jugular venous catheter tip projects over the midportion of the SVC. The large caliber dual-lumen right internal jugular catheter projects over lower third of the SVC.  IMPRESSION: Slight interval improvement in the appearance of the will pulmonary interstitium and central pulmonary vascularity may reflect improving pulmonary edema. Bilateral pleural effusions and bibasilar atelectasis or infiltrate persists. The support tubes are in reasonable position.   Electronically Signed   By: Kyle  [name] M.D.   On: 08/26/2014 07:12   Dg Abd Portable 1v  08/25/2014   CLINICAL DATA:  Ileus.  EXAM: PORTABLE ABDOMEN - 1 VIEW  COMPARISON:  08/21/2014  FINDINGS: NG tube remains in the stomach. There is a nonobstructive bowel gas pattern. No gaseous distention to suggest ileus. No free air or visible organomegaly or suspicious calcification. No acute bony abnormality.  IMPRESSION: No evidence of obstruction or ileus.   Electronically Signed   By: Rolm Baptise M.D.   On: 08/25/2014 12:43     ASSESSMENT / PLAN:  PULMONARY ETT 7/27>>> A: Acute respiratory failure after cardiac arrest.pleural effusion rt?, pulm edema, gross overlaod  P:   Full vent support  Continue diuresis with CVVH Repeat SBT in a.m.  CARDIOVASCULAR Rt IJ HD cath 7/27 >>  Lt IJ CVL  7/27 >> Rt femoral aline 7/27 >> A: PEA cardiac arrest. Acute on chronic combined CHF. Hx of HTN.  P:  PRN hydralazine Cardiology following Could consider HD Lopressor twice a day via tube  RENAL A: ESRD. Non gap metabolic acidosis - resolved.  P:   Continuing CVVHD Nephrology following Continuing to monitor electrolytes twice a day  GASTROINTESTINAL A: Ascites - Doubt SBP given coag neg staph  P:   Tube feeds, advance to goal met again at 10  Protonix for SUP Neg balance needed  HEMATOLOGIC A: Anemia of chronic disease. Some loss blood from cvvhd lack of return to filter  P:  Trending hemoglobin daily with CBC Heparin subcutaneous every 8 hours   SCDs  INFECTIOUS A: Possible SBP >> GPC in clusters in peritoneal fluid,  concern asp 7/30 Possible Aspiration Pneumonia  P:   Awaiting finalization of cultures.   Peritoneal fluid 7/27 >> GPC in clusters >>coag neg staph Respiratory Ctx 7/30 >>  Zosyn 7/30>>>  ENDOCRINE A: Hx of DM. Episodes of hypoglycemia.  P:   SSI w/  Accu-Cheks every 4 hours   NEUROLOGIC A: Acute encephalopathy after cardiac arrest - resolved.  P:   RASS goal 0 PRN fentanyl WUA    I have spent a total of 32 minutes of critical care time today caring for this patient, attempting to update the patient's wife via phone, and reviewing the patient's electronic medical record.  Sonia Baller Ashok Cordia, M.D. [facility] Pager:  [phone] After 3pm or if no response, call [phone]

## 2014-08-26 NOTE — Progress Notes (Signed)
Subjective:  BP high overnight- now low after one dose hydralazine and versed- CRRT running well - taking 50 per hour Objective Vital signs in last 24 hours: Filed Vitals:   08/26/14 0530 08/26/14 0600 08/26/14 0700 08/26/14 0722  BP: 117/70 99/56 101/61   Pulse: 66 67 60   Temp: 97.2 F (36.2 C)   97.7 F (36.5 C)  TempSrc:    Oral  Resp: 12 12 12    Height:      Weight:      SpO2: 100% 100% 100%    Weight change: -0.9 kg (-1 lb 15.7 oz)  Intake/Output Summary (Last 24 hours) at 08/26/14 [id number] Last data filed at 08/26/14 0700  Gross per 24 hour  Intake   1717 ml  Output   3004 ml  Net  -1287 ml    Assessment/ Plan: Pt is a 59 y.o. yo male with known stage 5 CKD who was admitted on 08/20/2014 with SOB and worsening renal function.  Then had cardiac arrest in the setting of having Permcath placed on 7/27  Assessment/Plan: 1. Renal- progressed to ESRD recently-  was placed on CRRT due to events - on since 7/27- responding well- need to continue- will remove more fluid as able- keep on CRRT  2. Anemia- related to events and CKD- check iron and add aranesp.  Will watch hgb and platelets- giving heparin with hd 3. HTN/volume- massive volume overload- needs much volume removal- keep on CRRT - most efficient way to remove large volumes- inc UF to 100-200 cc's per hour  4. Elytes- stable 5. VDRF- per CCM 6. S/p arrest- neuro improvements 7. ID- ?peritonitis- abx per CCM  Jimy Gates A    Labs: Basic Metabolic Panel:  Recent Labs Lab 08/24/14 1642 08/25/14 0430 08/25/14 1600 08/26/14 0420  NA 139 139 137 136  K 4.6 4.3 4.2 4.0  CL 104 105 102 105  CO2 22 26 25 26   GLUCOSE 122* 93 105* 122*  BUN 73* 62* 58* 51*  CREATININE 7.47* 6.07* 5.07* 4.44*  CALCIUM 7.1* 7.1* 7.2* 7.2*  PHOS 6.0* 4.9* 4.0  --    Liver Function Tests:  Recent Labs Lab 08/21/14 1700  08/25/14 0430 08/25/14 1600 08/26/14 0420  AST 31  --  17  --  20  ALT 40  --  15*  --  14*  ALKPHOS  116  --  85  --  77  BILITOT 0.6  --  0.5  --  0.4  PROT 5.1*  --  5.0*  --  5.1*  ALBUMIN 1.8*  < > 1.7* 1.6* 1.7*  < > = values in this interval not displayed. No results for input(s): LIPASE, AMYLASE in the last 168 hours. No results for input(s): AMMONIA in the last 168 hours. CBC:  Recent Labs Lab 08/21/14 1700 08/22/14 0445 08/23/14 0500 08/24/14 0400 08/25/14 0430 08/26/14 0420  WBC 7.2 8.4 10.1 11.1* 8.7 7.5  NEUTROABS 6.4  --   --   --  7.2 6.0  HGB 9.5* 12.5* 12.8* 10.0* 8.8* 8.6*  HCT 28.3* 36.5* 37.8* 30.5* 27.2* 26.3*  MCV 79.9 78.0 78.9 81.3 83.4 83.5  PLT 276 329 366 292 193 174   Cardiac Enzymes:  Recent Labs Lab 08/21/14 0149 08/21/14 0802 08/21/14 1700 08/21/14 2050 08/22/14 0445  TROPONINI 0.05* 0.04* 0.06* 0.17* 0.15*   CBG:  Recent Labs Lab 08/25/14 1546 08/25/14 1953 08/25/14 2348 08/26/14 0417 08/26/14 0719  GLUCAP 98 117* 144* 110* 132*  Iron Studies: No results for input(s): IRON, TIBC, TRANSFERRIN, FERRITIN in the last 72 hours. Studies/Results: Dg Chest Port 1 View  08/26/2014   CLINICAL DATA:  CHF, acute respiratory failure, anasarca  EXAM: PORTABLE CHEST - 1 VIEW  COMPARISON:  Portable chest x-ray of August 25, 2014  FINDINGS: The lungs remain hypoinflated. There are persistent bilateral pleural effusions as well bibasilar atelectasis or infiltrate. There is no pneumothorax. The cardiac silhouette is largely obscured. The central pulmonary vascularity is more distinct today. The endotracheal tube tip lies 2 cm above the carina. The esophagogastric tube tip projects below the inferior margin of the image. The left internal jugular venous catheter tip projects over the midportion of the SVC. The large caliber dual-lumen right internal jugular catheter projects over lower third of the SVC.  IMPRESSION: Slight interval improvement in the appearance of the will pulmonary interstitium and central pulmonary vascularity may reflect improving  pulmonary edema. Bilateral pleural effusions and bibasilar atelectasis or infiltrate persists. The support tubes are in reasonable position.   Electronically Signed   By: David  Martinique M.D.   On: 08/26/2014 07:12   Dg Chest Port 1 View  08/25/2014   CLINICAL DATA:  Cardiac arrest  EXAM: PORTABLE CHEST - 1 VIEW  COMPARISON:  Radiograph 08/24/2014  FINDINGS: Endotracheal tube is approximately 1 cm from the carina. LEFT central venous line and NG tube are unchanged. There are low lung volumes and basilar atelectasis. Small effusions noted. Central venous congestion present. No pneumothorax.  IMPRESSION: 1. Endotracheal tube is relatively low approximately 1 cm from carina. No significant change. 2. Low lung volumes, bibasilar atelectasis and small effusions.   Electronically Signed   By: Suzy Bouchard M.D.   On: 08/25/2014 09:09   Dg Chest Port 1 View  08/24/2014   CLINICAL DATA:  Acute respiratory failure, hypoxia. Exchange of endotracheal tube.  EXAM: PORTABLE CHEST - 1 VIEW  COMPARISON:  08/24/2014  FINDINGS: Endotracheal tube is 2.5 cm above the carina. Left central line and right dialysis catheter is well is NG tube are unchanged.  Bilateral perihilar and lower lobe opacities with layering effusions, unchanged.  IMPRESSION: Endotracheal tube 2.5 cm above the carina. Otherwise no significant change.   Electronically Signed   By: Rolm Baptise M.D.   On: 08/24/2014 18:29   Dg Abd Portable 1v  08/25/2014   CLINICAL DATA:  Ileus.  EXAM: PORTABLE ABDOMEN - 1 VIEW  COMPARISON:  08/21/2014  FINDINGS: NG tube remains in the stomach. There is a nonobstructive bowel gas pattern. No gaseous distention to suggest ileus. No free air or visible organomegaly or suspicious calcification. No acute bony abnormality.  IMPRESSION: No evidence of obstruction or ileus.   Electronically Signed   By: Rolm Baptise M.D.   On: 08/25/2014 12:43   Medications: Infusions: . sodium chloride Stopped (08/24/14 0300)  . fentaNYL  infusion INTRAVENOUS 350 mcg/hr (08/26/14 0546)  . dialysis replacement fluid (prismasate) 300 mL/hr at 08/26/14 0534  . dialysis replacement fluid (prismasate) 200 mL/hr at 08/26/14 0105  . dialysate (PRISMASATE) 2,000 mL/hr at 08/26/14 0746    Scheduled Medications: . antiseptic oral rinse  7 mL Mouth Rinse QID  . chlorhexidine  15 mL Mouth Rinse BID  . feeding supplement (NEPRO CARB STEADY)  1,000 mL Per Tube Q24H  . feeding supplement (PRO-STAT SUGAR FREE 64)  60 mL Per Tube 5 X Daily  . heparin  5,000 Units Subcutaneous 3 times per day  . insulin aspart  0-20 Units  Subcutaneous 6 times per day  . metoprolol tartrate  50 mg Per Tube BID  . pantoprazole sodium  40 mg Per Tube Q24H  . piperacillin-tazobactam  3.375 g Intravenous 4 times per day  . sodium chloride  3 mL Intravenous Q12H    have reviewed scheduled and prn medications.  Physical Exam: General: eyes open- intermittent commands Heart: RRR Lungs: dec BS at bases Abdomen: distended  Extremities: massive pitting edema Dialysis Access: right sided PC    08/26/2014,8:23 AM  LOS: 6 days

## 2014-08-26 NOTE — Progress Notes (Signed)
Subjective:  Follows some commands. On Vent  Objective:  Vital Signs in the last 24 hours: Temp:  [94.1 F (34.5 C)-97.7 F (36.5 C)] 97.7 F (36.5 C) (08/01 0722) Pulse Rate:  [60-91] 60 (08/01 0700) Resp:  [0-21] 12 (08/01 0700) BP: (99-182)/(56-112) 101/61 mmHg (08/01 0700) SpO2:  [99 %-100 %] 100 % (08/01 0700) FiO2 (%):  [30 %-40 %] 40 % (08/01 0433) Weight:  [231 lb 11.3 oz (105.1 kg)] 231 lb 11.3 oz (105.1 kg) (08/01 0500)  Intake/Output from previous day: 07/31 0701 - 08/01 0700 In: 1794.3 [I.V.:829.3; NG/GT:765; IV Piggyback:200] Out: 3096 [Stool:1]   Physical Exam: General: Well developed, well nourished, sedate. Head:  Normocephalic and atraumatic. Vent Lungs: Clear to auscultation and percussion. Heart: Normal S1 and S2.  No murmur, rubs or gallops.  Abdomen: soft, non-tender, positive bowel sounds. Obese/anasarca Extremities: No clubbing or cyanosis. 2+ edema. Neurologic: Sedate but opens eyes to command R subclav cath    Lab Results:  Recent Labs  08/25/14 0430 08/26/14 0420  WBC 8.7 7.5  HGB 8.8* 8.6*  PLT 193 174    Recent Labs  08/25/14 1600 08/26/14 0420  NA 137 136  K 4.2 4.0  CL 102 105  CO2 25 26  GLUCOSE 105* 122*  BUN 58* 51*  CREATININE 5.07* 4.44*   No results for input(s): TROPONINI in the last 72 hours.  Invalid input(s): CK, MB Hepatic Function Panel  Recent Labs  08/26/14 0420  PROT 5.1*  ALBUMIN 1.7*  AST 20  ALT 14*  ALKPHOS 77  BILITOT 0.4   No results for input(s): CHOL in the last 72 hours. No results for input(s): PROTIME in the last 72 hours.  Imaging: Dg Chest Port 1 View  08/26/2014   CLINICAL DATA:  CHF, acute respiratory failure, anasarca  EXAM: PORTABLE CHEST - 1 VIEW  COMPARISON:  Portable chest x-ray of August 25, 2014  FINDINGS: The lungs remain hypoinflated. There are persistent bilateral pleural effusions as well bibasilar atelectasis or infiltrate. There is no pneumothorax. The cardiac  silhouette is largely obscured. The central pulmonary vascularity is more distinct today. The endotracheal tube tip lies 2 cm above the carina. The esophagogastric tube tip projects below the inferior margin of the image. The left internal jugular venous catheter tip projects over the midportion of the SVC. The large caliber dual-lumen right internal jugular catheter projects over lower third of the SVC.  IMPRESSION: Slight interval improvement in the appearance of the will pulmonary interstitium and central pulmonary vascularity may reflect improving pulmonary edema. Bilateral pleural effusions and bibasilar atelectasis or infiltrate persists. The support tubes are in reasonable position.   Electronically Signed   By: David  Martinique M.D.   On: 08/26/2014 07:12   Dg Chest Port 1 View  08/25/2014   CLINICAL DATA:  Cardiac arrest  EXAM: PORTABLE CHEST - 1 VIEW  COMPARISON:  Radiograph 08/24/2014  FINDINGS: Endotracheal tube is approximately 1 cm from the carina. LEFT central venous line and NG tube are unchanged. There are low lung volumes and basilar atelectasis. Small effusions noted. Central venous congestion present. No pneumothorax.  IMPRESSION: 1. Endotracheal tube is relatively low approximately 1 cm from carina. No significant change. 2. Low lung volumes, bibasilar atelectasis and small effusions.   Electronically Signed   By: Suzy Bouchard M.D.   On: 08/25/2014 09:09   Dg Chest Port 1 View  08/24/2014   CLINICAL DATA:  Acute respiratory failure, hypoxia. Exchange of endotracheal tube.  EXAM: PORTABLE CHEST - 1 VIEW  COMPARISON:  08/24/2014  FINDINGS: Endotracheal tube is 2.5 cm above the carina. Left central line and right dialysis catheter is well is NG tube are unchanged.  Bilateral perihilar and lower lobe opacities with layering effusions, unchanged.  IMPRESSION: Endotracheal tube 2.5 cm above the carina. Otherwise no significant change.   Electronically Signed   By: Rolm Baptise M.D.   On:  08/24/2014 18:29   Dg Abd Portable 1v  08/25/2014   CLINICAL DATA:  Ileus.  EXAM: PORTABLE ABDOMEN - 1 VIEW  COMPARISON:  08/21/2014  FINDINGS: NG tube remains in the stomach. There is a nonobstructive bowel gas pattern. No gaseous distention to suggest ileus. No free air or visible organomegaly or suspicious calcification. No acute bony abnormality.  IMPRESSION: No evidence of obstruction or ileus.   Electronically Signed   By: Rolm Baptise M.D.   On: 08/25/2014 12:43   Personally viewed.   Telemetry: No adverse arrhythmias, rare PAT Personally viewed.   EKG:  NSR  Cardiac Studies:  EF 10%, LVH  Scheduled Meds: . antiseptic oral rinse  7 mL Mouth Rinse QID  . chlorhexidine  15 mL Mouth Rinse BID  . feeding supplement (NEPRO CARB STEADY)  1,000 mL Per Tube Q24H  . feeding supplement (PRO-STAT SUGAR FREE 64)  60 mL Per Tube 5 X Daily  . heparin  5,000 Units Subcutaneous 3 times per day  . insulin aspart  0-20 Units Subcutaneous 6 times per day  . metoprolol tartrate  50 mg Per Tube BID  . pantoprazole sodium  40 mg Per Tube Q24H  . piperacillin-tazobactam  3.375 g Intravenous 4 times per day  . sodium chloride  3 mL Intravenous Q12H   Continuous Infusions: . sodium chloride Stopped (08/24/14 0300)  . fentaNYL infusion INTRAVENOUS 350 mcg/hr (08/26/14 0546)  . dialysis replacement fluid (prismasate) 300 mL/hr at 08/26/14 0534  . dialysis replacement fluid (prismasate) 200 mL/hr at 08/26/14 0105  . dialysate (PRISMASATE) 2,000 mL/hr at 08/26/14 0746   PRN Meds:.bisacodyl, fentaNYL (SUBLIMAZE) injection, heparin, hydrALAZINE, midazolam, [DISCONTINUED] ondansetron **OR** ondansetron (ZOFRAN) IV, sodium chloride   Assessment/Plan:  Principal Problem:   Anasarca Active Problems:   Acute on chronic combined systolic and diastolic CHF, NYHA class 2   CKD (chronic kidney disease) stage 5, GFR less than 15 ml/min   Benign essential HTN   DM (diabetes mellitus), type 2, uncontrolled,  with renal complications   Troponin level elevated   Anemia of chronic disease   Cardiorenal syndrome with renal failure   Ascites   Acute respiratory failure, unspecified whether with hypoxia or hypercapnia   Cardiac arrest   Pressure ulcer   Acute respiratory failure with hypoxia  59 year old with ESRD, s/p PEA arrest on 7/27 following dialysis cath placement post arctic sun protocol, EF 10%.  1. Cardiac arrest - PEA, bradycardic. Post arctic sun. Moves to commands.   2. Cardiomyopathy - acute on chronic systolic heart failure  - EF 10%  - metoprolol, Bb  - no ACE-I secondary to renal function  - If continues to improve neurologically, plan from Dr. Johnsie Cancel previously was to have evaluated by CHF team and EP.  - Narrow QRS (no BiV ICD)  - Dialysis for volume control  - May need cath in future to exclude CAD.   3. Acute respiratory failure  - vent per CCM  4. Possible SBP as well as aspiration  5. Encephalopathy   - seems to be  slowly improving.   - Zosyn  Will follow.       Kyle [name], [place] 08/26/2014, 8:14 AM

## 2014-08-27 DIAGNOSIS — I10 Essential (primary) hypertension: Secondary | ICD-10-CM

## 2014-08-27 LAB — IRON AND TIBC
Iron: 21 ug/dL — ABNORMAL LOW (ref 45–182)
SATURATION RATIOS: 9 % — AB (ref 17.9–39.5)
TIBC: 227 ug/dL — ABNORMAL LOW (ref 250–450)
UIBC: 206 ug/dL

## 2014-08-27 LAB — GLUCOSE, CAPILLARY
GLUCOSE-CAPILLARY: 111 mg/dL — AB (ref 65–99)
GLUCOSE-CAPILLARY: 128 mg/dL — AB (ref 65–99)
GLUCOSE-CAPILLARY: 212 mg/dL — AB (ref 65–99)
GLUCOSE-CAPILLARY: 136 mg/dL — AB (ref 65–99)
Glucose-Capillary: 132 mg/dL — ABNORMAL HIGH (ref 65–99)
Glucose-Capillary: 111 mg/dL — ABNORMAL HIGH (ref 65–99)
Glucose-Capillary: 118 mg/dL — ABNORMAL HIGH (ref 65–99)

## 2014-08-27 LAB — RENAL FUNCTION PANEL
ALBUMIN: 1.9 g/dL — AB (ref 3.5–5.0)
ANION GAP: 10 (ref 5–15)
ANION GAP: 9 (ref 5–15)
Albumin: 1.9 g/dL — ABNORMAL LOW (ref 3.5–5.0)
BUN: 46 mg/dL — AB (ref 6–20)
BUN: 44 mg/dL — ABNORMAL HIGH (ref 6–20)
CO2: 25 mmol/L (ref 22–32)
CO2: 27 mmol/L (ref 22–32)
CREATININE: 3.35 mg/dL — AB (ref 0.61–1.24)
Calcium: 7.7 mg/dL — ABNORMAL LOW (ref 8.9–10.3)
Calcium: 7.6 mg/dL — ABNORMAL LOW (ref 8.9–10.3)
Chloride: 100 mmol/L — ABNORMAL LOW (ref 101–111)
Chloride: 101 mmol/L (ref 101–111)
Creatinine, Ser: 2.77 mg/dL — ABNORMAL HIGH (ref 0.61–1.24)
GFR calc non Af Amer: 23 mL/min — ABNORMAL LOW (ref >60)
GFR, EST AFRICAN AMERICAN: 22 mL/min — AB (ref >60)
GFR, EST AFRICAN AMERICAN: 27 mL/min — AB (ref >60)
GFR, EST NON AFRICAN AMERICAN: 19 mL/min — AB (ref >60)
Glucose, Bld: 124 mg/dL — ABNORMAL HIGH (ref 65–99)
Glucose, Bld: 135 mg/dL — ABNORMAL HIGH (ref 65–99)
Phosphorus: 2.2 mg/dL — ABNORMAL LOW (ref 2.5–4.6)
Phosphorus: 3.5 mg/dL (ref 2.5–4.6)
Potassium: 4 mmol/L (ref 3.5–5.1)
Potassium: 3.9 mmol/L (ref 3.5–5.1)
SODIUM: 135 mmol/L (ref 135–145)
SODIUM: 137 mmol/L (ref 135–145)

## 2014-08-27 LAB — CULTURE, RESPIRATORY

## 2014-08-27 LAB — CULTURE, RESPIRATORY W GRAM STAIN: Special Requests: NORMAL

## 2014-08-27 LAB — MAGNESIUM: MAGNESIUM: 2.2 mg/dL (ref 1.7–2.4)

## 2014-08-27 LAB — FERRITIN: FERRITIN: 118 ng/mL (ref 24–336)

## 2014-08-27 MED ORDER — DEXAMETHASONE SODIUM PHOSPHATE 4 MG/ML IJ SOLN
2.0000 mg | Freq: Four times a day (QID) | INTRAMUSCULAR | Status: AC
  Administered 2014-08-27 (×2): 2 mg via INTRAVENOUS
  Filled 2014-08-27 (×2): qty 0.5

## 2014-08-27 MED ORDER — SODIUM CHLORIDE 0.9 % IV SOLN
125.0000 mg | Freq: Every day | INTRAVENOUS | Status: AC
  Administered 2014-08-27 – 2014-08-29 (×3): 125 mg via INTRAVENOUS
  Filled 2014-08-27 (×5): qty 10

## 2014-08-27 MED ORDER — SODIUM PHOSPHATE 3 MMOLE/ML IV SOLN
20.0000 mmol | Freq: Once | INTRAVENOUS | Status: AC
  Administered 2014-08-27: 20 mmol via INTRAVENOUS
  Filled 2014-08-27: qty 6.67

## 2014-08-27 MED ORDER — SODIUM CHLORIDE 0.9 % IV SOLN: INTRAVENOUS | Status: DC

## 2014-08-27 NOTE — Progress Notes (Signed)
ANTIBIOTIC CONSULT NOTE - FOLLOW UP  Pharmacy Consult for Zosyn Indication: aspiration PNA  No Known Allergies  Patient Measurements: Height: 6' (182.9 cm) Weight: 220 lb 10.9 oz (100.1 kg) IBW/kg (Calculated) : 77.6  Vital Signs: Temp: 97.5 F (36.4 C) (08/02 0800) Temp Source: Oral (08/02 0800) BP: 141/84 mmHg (08/02 1000) Pulse Rate: 73 (08/02 1000) Intake/Output from previous day: 08/01 0701 - 08/02 0700 In: 1927.3 [I.V.:457.3; NG/GT:1270; IV Piggyback:200] Out: 6038  Intake/Output from this shift: Total I/O In: 340.8 [I.V.:26.3; NG/GT:140; IV Piggyback:174.5] Out: 1003 [Other:1003]  Labs:  Recent Labs  08/25/14 0430  08/26/14 0420 08/26/14 1630 08/27/14 0345  WBC 8.7  --  7.5  --   --   HGB 8.8*  --  8.6*  --   --   PLT 193  --  174  --   --   CREATININE 6.07*  < > 4.44* 3.74* 3.35*  < > = values in this interval not displayed. Estimated Creatinine Clearance: 29.1 mL/min (by C-G formula based on Cr of 3.35). No results for input(s): VANCOTROUGH, VANCOPEAK, VANCORANDOM, GENTTROUGH, GENTPEAK, GENTRANDOM, TOBRATROUGH, TOBRAPEAK, TOBRARND, AMIKACINPEAK, AMIKACINTROU, AMIKACIN in the last 72 hours.   Microbiology: Recent Results (from the past 720 hour(s))  Surgical pcr screen     Status: Abnormal   Collection Time: 08/21/14 11:55 AM  Result Value Ref Range Status   MRSA, PCR NEGATIVE NEGATIVE Final   Staphylococcus aureus POSITIVE (A) NEGATIVE Final    Comment:        The Xpert SA Assay (FDA approved for NASAL specimens in patients over 46 years of age), is one component of a comprehensive surveillance program.  Test performance has been validated by [facility] for patients greater than or equal to 67 year old. It is not intended to diagnose infection nor to guide or monitor treatment.   Culture, body fluid-bottle     Status: None   Collection Time: 08/21/14 12:43 PM  Result Value Ref Range Status   Specimen Description FLUID PARACENTESIS  Final   Special Requests NONE  Final   Gram Stain   Final    GRAM POSITIVE COCCI IN CLUSTERS IN BOTH AEROBIC AND ANAEROBIC BOTTLES CRITICAL RESULT CALLED TO, READ BACK BY AND VERIFIED WITH: F ABERION 08/22/14 @ 59 M VESTAL    Culture STAPHYLOCOCCUS SPECIES (COAGULASE NEGATIVE)  Final   Report Status 08/25/2014 FINAL  Final   Organism ID, Bacteria STAPHYLOCOCCUS SPECIES (COAGULASE NEGATIVE)  Final      Susceptibility   Staphylococcus species (coagulase negative) - MIC*    CIPROFLOXACIN <=0.5 SENSITIVE Sensitive     ERYTHROMYCIN >=8 RESISTANT Resistant     GENTAMICIN <=0.5 SENSITIVE Sensitive     OXACILLIN <=0.25 SENSITIVE Sensitive     TETRACYCLINE <=1 SENSITIVE Sensitive     VANCOMYCIN 2 SENSITIVE Sensitive     TRIMETH/SULFA <=10 SENSITIVE Sensitive     CLINDAMYCIN >=8 RESISTANT Resistant     RIFAMPIN <=0.5 SENSITIVE Sensitive     Inducible Clindamycin NEGATIVE Sensitive     * STAPHYLOCOCCUS SPECIES (COAGULASE NEGATIVE)  Gram stain     Status: None   Collection Time: 08/21/14 12:43 PM  Result Value Ref Range Status   Specimen Description FLUID PARACENTESIS  Final   Special Requests NONE  Final   Gram Stain   Final    FEW WBC PRESENT,BOTH PMN AND MONONUCLEAR NO ORGANISMS SEEN    Report Status 08/22/2014 FINAL  Final  MRSA PCR Screening     Status: None  Collection Time: 08/21/14  4:33 PM  Result Value Ref Range Status   MRSA by PCR NEGATIVE NEGATIVE Final    Comment:        The GeneXpert MRSA Assay (FDA approved for NASAL specimens only), is one component of a comprehensive MRSA colonization surveillance program. It is not intended to diagnose MRSA infection nor to guide or monitor treatment for MRSA infections.   Culture, respiratory (NON-Expectorated)     Status: None   Collection Time: 08/24/14  6:52 PM  Result Value Ref Range Status   Specimen Description TRACHEAL ASPIRATE  Final   Special Requests Normal  Final   Gram Stain   Final    MODERATE WBC PRESENT,  PREDOMINANTLY PMN NO SQUAMOUS EPITHELIAL CELLS SEEN FEW GRAM POSITIVE RODS Performed at [facility]    Culture   Final    MODERATE CANDIDA ALBICANS Performed at [facility]    Report Status 08/27/2014 FINAL  Final    Anti-infectives    Start     Dose/Rate Route Frequency Ordered Stop   08/24/14 1900  vancomycin (VANCOCIN) 1,500 mg in sodium chloride 0.9 % 500 mL IVPB  Status:  Discontinued     1,500 mg 250 mL/hr over 120 Minutes Intravenous Every 48 hours 08/22/14 1830 08/23/14 1332   08/24/14 1800  piperacillin-tazobactam (ZOSYN) IVPB 3.375 g  Status:  Discontinued     3.375 g 12.5 mL/hr over 240 Minutes Intravenous 4 times per day 08/24/14 1715 08/24/14 1719   08/24/14 1800  piperacillin-tazobactam (ZOSYN) IVPB 3.375 g     3.375 g 100 mL/hr over 30 Minutes Intravenous 4 times per day 08/24/14 1720     08/24/14 1600  nafcillin 1 g in dextrose 5 % 50 mL IVPB  Status:  Discontinued     1 g 100 mL/hr over 30 Minutes Intravenous 6 times per day 08/24/14 1439 08/24/14 1715   08/24/14 1530  nafcillin injection 1 g  Status:  Discontinued     1 g Intravenous Every 4 hours 08/24/14 1428 08/24/14 1439   08/23/14 2100  vancomycin (VANCOCIN) IVPB 1000 mg/200 mL premix  Status:  Discontinued     1,000 mg 200 mL/hr over 60 Minutes Intravenous Every 24 hours 08/23/14 1340 08/24/14 1428   08/23/14 2000  vancomycin (VANCOCIN) IVPB 1000 mg/200 mL premix  Status:  Discontinued     1,000 mg 200 mL/hr over 60 Minutes Intravenous Every 24 hours 08/23/14 1333 08/23/14 1340   08/22/14 1900  vancomycin (VANCOCIN) 1,750 mg in sodium chloride 0.9 % 500 mL IVPB     1,750 mg 250 mL/hr over 120 Minutes Intravenous  Once 08/22/14 1826 08/22/14 2142   08/21/14 1353  ceFAZolin (ANCEF) IVPB 1 g/50 mL premix    Comments:  Send with pt to OR   1 g 100 mL/hr over 30 Minutes Intravenous To [facility] 08/21/14 0837 08/21/14 1500      Assessment: 59yo male with history of CKD5 and  recent placement of AVF presented 7/26 as PEA arrest.  Pharmacy consulted to dose Zosyn.  ID: s/p paracentesis 7/28 - peritoneal cx growing GPC, Possible SBP  7/30 pt vomited, TF stopped, suctioned 700 ml light brown fluid - concern for aspiration, switched nafcillin to Zoysn WBC WNL, afebrile (rewarmed) - stable from infectious standpoint since vomiting episode  7/28 Vanc>>7/30 7/30 Nafcillin>>7/30 (no doses received) 7/31 Zosyn 3.375g IV q 6 hrs>>  7/27 paracentesis fluid>>GPC>>CoNS pan sensitive except clindamycin and erythromycin  7/30 TA aspirate>> candida albicans  Day #3 Zosyn, day #5 total abx coverage To be transitioned to IHD 08/03-08/04 or when pt extubated  Plan:  Consider defining defining duration of therapy and abx end date Continue Zosyn 3.375g IV q6h while on CRRT Can give 2.25g q8h on IHD if plans to continue abx therapy  Wynelle Fanny  PGY2 Cardiology Pharmacy Resident 08/27/2014,11:37 AM

## 2014-08-27 NOTE — Progress Notes (Addendum)
PULMONARY / CRITICAL CARE MEDICINE   Name: Kyle [name] MRN: [id number] DOB: December 04, 1955    ADMISSION DATE:  08/20/2014 CONSULTATION DATE:  08/20/2014  REFERRING MD :  Ree Kida   CHIEF COMPLAINT:  PEA arrest    INITIAL PRESENTATION:   16 with hx of ESRD presented for tunneled HD catheter.  Developed bradycardic PEA arrest >> about 10 minutes for ROSC.  Hx of systolic CHF with baseline EF 15%.  STUDIES:  7/27 paracentesis 7/28 Echo >> EF 10%  SIGNIFICANT EVENTS: 7/27 Cardiac arrest, intubated and to ICU, hypothermia 7/29 rewarmed 7/30 more agitated  SUBJECTIVE:  No events overnight. Patient remains intubated. Nods no to any pain or difficulty breathing.  ROS: Unobtainable as the patient is currently intubated.  VITAL SIGNS: Temp:  [97.5 F (36.4 C)-98.9 F (37.2 C)] 97.5 F (36.4 C) (08/02 0800) Pulse Rate:  [73-95] 77 (08/02 1138) Resp:  [12-29] 17 (08/02 1138) BP: (126-203)/(83-132) 126/85 mmHg (08/02 1138) SpO2:  [99 %-100 %] 100 % (08/02 1138) FiO2 (%):  [40 %] 40 % (08/02 1138) Weight:  [220 lb 10.9 oz (100.1 kg)] 220 lb 10.9 oz (100.1 kg) (08/02 0300)   HEMODYNAMICS: CVP:  [8 mmHg-9 mmHg] 9 mmHg  VENTILATOR SETTINGS: Vent Mode:  [-] PRVC FiO2 (%):  [40 %] 40 % Set Rate:  [12 bmp] 12 bmp Vt Set:  [620 mL] 620 mL PEEP:  [0 cmH20-5 cmH20] 5 cmH20 Pressure Support:  [0 cmH20-10 cmH20] 0 cmH20 Plateau Pressure:  [17 cmH20-23 cmH20] 17 cmH20   INTAKE / OUTPUT:  Intake/Output Summary (Last 24 hours) at 08/27/14 1201 Last data filed at 08/27/14 1100  Gross per 24 hour  Intake 1943.17 ml  Output   6485 ml  Net -4541.83 ml   PHYSICAL EXAMINATION: General:  Awake. Alert. No acute distress. No family at bedside.  Integument:  Warm & dry. No rash on exposed skin. No bruising. Right chest wall tunneled dialysis access. HEENT:  Moist mucus membranes. Endotracheal tube in place. PERRL. Cardiovascular:  Regular rate. Anasarca improving. Unable to appreciate JVD.   Pulmonary:  Improving rhonchi. Symmetric chest wall rise. No accessory muscle use on ventilator. Abdomen: Soft. Normal bowel sounds. Nondistended.  Neurological:  Patient is nodding to questions. Follows commands when toes on command as well as giving thumbs up on appropriate hand.  LABS:  CBC  Recent Labs Lab 08/24/14 0400 08/25/14 0430 08/26/14 0420  WBC 11.1* 8.7 7.5  HGB 10.0* 8.8* 8.6*  HCT 30.5* 27.2* 26.3*  PLT 292 193 174   Coag's  Recent Labs Lab 08/20/14 2058 08/21/14 1700 08/22/14 0115  APTT 36 34 35  INR 1.28 1.30 1.29   BMET  Recent Labs Lab 08/26/14 0420 08/26/14 1630 08/27/14 0345  NA 136 136 135  K 4.0 4.2 4.0  CL 105 102 100*  CO2 26 26 25   BUN 51* 47* 46*  CREATININE 4.44* 3.74* 3.35*  GLUCOSE 122* 142* 124*   Electrolytes  Recent Labs Lab 08/25/14 0430 08/25/14 1600 08/26/14 0420 08/26/14 1630 08/27/14 0345  CALCIUM 7.1* 7.2* 7.2* 7.4* 7.7*  MG 1.9  --  2.0  --  2.2  PHOS 4.9* 4.0  --  3.1 2.2*   Sepsis Markers  Recent Labs Lab 08/21/14 1656 08/21/14 1700 08/21/14 2050  LATICACIDVEN 1.51 1.7 1.0   ABG  Recent Labs Lab 08/22/14 0850 08/23/14 0342 08/25/14 1045  PHART 7.454* 7.440 7.393  PCO2ART 24.1* 25.0* 38.9  PO2ART 172* 123.0* 162*   Liver Enzymes  Recent Labs Lab 08/21/14 1700  08/25/14 0430  08/26/14 0420 08/26/14 1630 08/27/14 0345  AST 31  --  17  --  20  --   --   ALT 40  --  15*  --  14*  --   --   ALKPHOS 116  --  85  --  77  --   --   BILITOT 0.6  --  0.5  --  0.4  --   --   ALBUMIN 1.8*  < > 1.7*  < > 1.7* 1.8* 1.9*  < > = values in this interval not displayed. Cardiac Enzymes  Recent Labs Lab 08/21/14 1700 08/21/14 2050 08/22/14 0445  TROPONINI 0.06* 0.17* 0.15*   Glucose  Recent Labs Lab 08/26/14 1218 08/26/14 1602 08/26/14 1956 08/26/14 2320 08/27/14 0326 08/27/14 0740  GLUCAP 120* 138* 116* 132* 111* 128*    Imaging No results found.   ASSESSMENT /  PLAN:  PULMONARY ETT 7/27>>> A: Acute respiratory failure after cardiac arrest.pleural effusion rt?, pulm edema, gross overlaod Failed SBT due to absence of cuff leak  P:   Full vent support  Continue diuresis with CVVH Repeat SBT in a.m. & check for cuff leak Decadron 2mg  IV q6hr x2 doses  CARDIOVASCULAR Rt IJ HD cath 7/27 >>  Lt IJ CVL 7/27 >> Rt femoral aline 7/27 >> A: PEA cardiac arrest. Acute on chronic combined CHF. Hx of HTN.  P:  PRN hydralazine Cardiology following Consider HD Lopressor twice a day via tube  RENAL A: ESRD. Non gap metabolic acidosis - resolved.  P:   Continuing CVVHD Nephrology following Continuing to monitor electrolytes twice a day  GASTROINTESTINAL A: Ascites - Doubt SBP given coag neg staph  P:   Tube feeds advance to goal Protonix for SUP Neg balance needed  HEMATOLOGIC A: Anemia of chronic disease. Some loss blood from cvvhd lack of return to filter  P:  Trending hemoglobin daily with CBC Heparin subcutaneous every 8 hours  SCDs  INFECTIOUS A: Possible SBP >> GPC in clusters in peritoneal fluid, concern asp 7/30 Possible Aspiration Pneumonia  P:   Awaiting finalization of cultures.   Peritoneal fluid 7/27 >> GPC in clusters >>coag neg staph Respiratory Ctx 7/30 >> C. albicans  Zosyn 7/30>>>  ENDOCRINE A: Hx of DM. Episodes of hypoglycemia.  P:   SSI w/  Accu-Cheks every 4 hours   NEUROLOGIC A: Acute encephalopathy after cardiac arrest - resolved.  P:   RASS goal 0 PRN fentanyl WUA    I have spent a total of 37 minutes of critical care time today caring for this patient, updating the patient's family at bedside, and reviewing the patient's electronic medical record.  Sonia Baller Ashok Cordia, M.D. [facility] Pager:  [phone] After 3pm or if no response, call [phone]

## 2014-08-27 NOTE — Progress Notes (Signed)
Subjective:  Eyes open. Follows commands. Yes to questions. On Vent  Objective:  Vital Signs in the last 24 hours: Temp:  [97.5 F (36.4 C)-98.9 F (37.2 C)] 97.5 F (36.4 C) (08/02 0800) Pulse Rate:  [73-95] 79 (08/02 0916) Resp:  [12-29] 13 (08/02 0853) BP: (140-203)/(83-132) 147/100 mmHg (08/02 0916) SpO2:  [99 %-100 %] 100 % (08/02 0853) FiO2 (%):  [40 %] 40 % (08/02 0853) Weight:  [220 lb 10.9 oz (100.1 kg)] 220 lb 10.9 oz (100.1 kg) (08/02 0300)  Intake/Output from previous day: 08/01 0701 - 08/02 0700 In: 1927.3 [I.V.:457.3; NG/GT:1270; IV Piggyback:200] Out: 6038    Physical Exam: General: Well developed, well nourished, sedate. Head:  Normocephalic and atraumatic. Vent Lungs: Clear to auscultation and percussion. Heart: Normal S1 and S2.  No murmur, rubs or gallops.  Abdomen: soft, non-tender, positive bowel sounds. Obese/anasarca Extremities: No clubbing or cyanosis. 2-3+ edema. Neurologic: follows commands R subclav cath    Lab Results:  Recent Labs  08/25/14 0430 08/26/14 0420  WBC 8.7 7.5  HGB 8.8* 8.6*  PLT 193 174    Recent Labs  08/26/14 1630 08/27/14 0345  NA 136 135  K 4.2 4.0  CL 102 100*  CO2 26 25  GLUCOSE 142* 124*  BUN 47* 46*  CREATININE 3.74* 3.35*   No results for input(s): TROPONINI in the last 72 hours.  Invalid input(s): CK, MB Hepatic Function Panel  Recent Labs  08/26/14 0420  08/27/14 0345  PROT 5.1*  --   --   ALBUMIN 1.7*  < > 1.9*  AST 20  --   --   ALT 14*  --   --   ALKPHOS 77  --   --   BILITOT 0.4  --   --   < > = values in this interval not displayed. No results for input(s): CHOL in the last 72 hours. No results for input(s): PROTIME in the last 72 hours.  Imaging: Dg Chest Port 1 View  08/26/2014   CLINICAL DATA:  CHF, acute respiratory failure, anasarca  EXAM: PORTABLE CHEST - 1 VIEW  COMPARISON:  Portable chest x-ray of August 25, 2014  FINDINGS: The lungs remain hypoinflated. There are  persistent bilateral pleural effusions as well bibasilar atelectasis or infiltrate. There is no pneumothorax. The cardiac silhouette is largely obscured. The central pulmonary vascularity is more distinct today. The endotracheal tube tip lies 2 cm above the carina. The esophagogastric tube tip projects below the inferior margin of the image. The left internal jugular venous catheter tip projects over the midportion of the SVC. The large caliber dual-lumen right internal jugular catheter projects over lower third of the SVC.  IMPRESSION: Slight interval improvement in the appearance of the will pulmonary interstitium and central pulmonary vascularity may reflect improving pulmonary edema. Bilateral pleural effusions and bibasilar atelectasis or infiltrate persists. The support tubes are in reasonable position.   Electronically Signed   By: David  Martinique M.D.   On: 08/26/2014 07:12   Dg Abd Portable 1v  08/25/2014   CLINICAL DATA:  Ileus.  EXAM: PORTABLE ABDOMEN - 1 VIEW  COMPARISON:  08/21/2014  FINDINGS: NG tube remains in the stomach. There is a nonobstructive bowel gas pattern. No gaseous distention to suggest ileus. No free air or visible organomegaly or suspicious calcification. No acute bony abnormality.  IMPRESSION: No evidence of obstruction or ileus.   Electronically Signed   By: Rolm Baptise M.D.   On: 08/25/2014 12:43  Personally viewed.   Telemetry: No adverse arrhythmias, rare PAT Personally viewed.   EKG:  NSR  Cardiac Studies:  EF 10%, LVH  Scheduled Meds: . antiseptic oral rinse  7 mL Mouth Rinse 10 times per day  . chlorhexidine  15 mL Mouth Rinse BID  . darbepoetin (ARANESP) injection - NON-DIALYSIS  150 mcg Subcutaneous Q Tue-1800  . feeding supplement (NEPRO CARB STEADY)  1,000 mL Per Tube Q24H  . feeding supplement (PRO-STAT SUGAR FREE 64)  60 mL Per Tube 5 X Daily  . ferric gluconate (FERRLECIT/NULECIT) IV  125 mg Intravenous Daily  . heparin  5,000 Units Subcutaneous 3  times per day  . insulin aspart  0-20 Units Subcutaneous 6 times per day  . metoprolol tartrate  50 mg Per Tube BID  . pantoprazole sodium  40 mg Per Tube Q24H  . piperacillin-tazobactam  3.375 g Intravenous 4 times per day  . sodium chloride  10-40 mL Intracatheter Q12H  . sodium phosphate  Dextrose 5% IVPB  20 mmol Intravenous Once   Continuous Infusions: . sodium chloride Stopped (08/24/14 0300)  . sodium chloride    . fentaNYL infusion INTRAVENOUS Stopped (08/27/14 1011)  . dialysis replacement fluid (prismasate) 300 mL/hr at 08/26/14 2301  . dialysis replacement fluid (prismasate) 200 mL/hr at 08/27/14 0231  . dialysate (PRISMASATE) 2,000 mL/hr at 08/27/14 0934   PRN Meds:.bisacodyl, fentaNYL (SUBLIMAZE) injection, heparin, hydrALAZINE, midazolam, [DISCONTINUED] ondansetron **OR** ondansetron (ZOFRAN) IV, sodium chloride, sodium chloride   Assessment/Plan:  Principal Problem:   Anasarca Active Problems:   Acute on chronic combined systolic and diastolic CHF, NYHA class 2   CKD (chronic kidney disease) stage 5, GFR less than 15 ml/min   Benign essential HTN   DM (diabetes mellitus), type 2, uncontrolled, with renal complications   Troponin level elevated   Anemia of chronic disease   Cardiorenal syndrome with renal failure   Ascites   Acute respiratory failure, unspecified whether with hypoxia or hypercapnia   Cardiac arrest   Pressure ulcer   Acute respiratory failure with hypoxia  59 year old with ESRD, s/p PEA arrest on 7/27 following dialysis cath placement post arctic sun protocol, EF 10%.  1. Cardiac arrest - PEA, bradycardic. Post arctic sun. Follows commands. Improving neurologically.  2. Cardiomyopathy - acute on chronic systolic heart failure  - EF 10%  - metoprolol, Bb  - no ACE-I secondary to renal function  - If continues to improve neurologically, plan from Dr. Johnsie Cancel previously was to have evaluated by CHF team and EP.  - Narrow QRS (no BiV ICD)  -  Dialysis for volume control  - May need cath in future to exclude CAD. 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   - 4 liters out yesterday  3. Acute respiratory failure  - vent per CCM  4. Possible SBP as well as aspiration  - ABX per CCM  5. Encephalopathy   - seems to be slowly improving.   - Zosyn  6. Essential hypertension  - Challenging to control with massive volume overload. As CRRT continues and volume removed, I suspect BP will improve.   Will follow.    SKAINS, [place] 08/27/2014, 10:24 AM

## 2014-08-27 NOTE — Progress Notes (Signed)
Subjective:  BP high again overnight-  CRRT running well - taking 200 per hour Objective Vital signs in last 24 hours: Filed Vitals:   08/27/14 0400 08/27/14 0500 08/27/14 0600 08/27/14 0700  BP: 183/99 162/129 177/101 156/89  Pulse: 88 89 85 76  Temp: 97.7 F (36.5 C)     TempSrc: Oral     Resp: 24 29 19 13   Height:      Weight:      SpO2: 100% 99% 99% 100%   Weight change: -5 kg (-11 lb 0.4 oz)  Intake/Output Summary (Last 24 hours) at 08/27/14 0719 Last data filed at 08/27/14 0700  Gross per 24 hour  Intake 1917.25 ml  Output   6038 ml  Net -4120.75 ml    Assessment/ Plan: Pt is a 59 y.o. yo male with known stage 5 CKD who was admitted on 08/20/2014 with SOB and worsening renal function.  Then had cardiac arrest in the setting of having Permcath placed on 7/27  Assessment/Plan: 1. Renal- progressed to ESRD recently-  was placed on CRRT due to events - on since 7/27- responding well- need to continue- will remove more fluid as able- keep on CRRT because most efficient way to remove fluid.  Hopefully will be able to transition to IHD next 24-48 hours or when extubated  2. Anemia- related to events and CKD- iron low- will replete and have added aranesp.  Will watch hgb and platelets- giving heparin with hd 3. HTN/volume- massive volume overload- needs much volume removal- keep on CRRT - most efficient way to remove large volumes- took off 4 liters yesterday- inc UF to 300 cc's per hour  4. Elytes-  K stable- will replete phos 5. VDRF- per CCM 6. S/p arrest- neuro improvements 7. ID- ?peritonitis- abx per CCM  Kyle [name] A    Labs: Basic Metabolic Panel:  Recent Labs Lab 08/25/14 1600 08/26/14 0420 08/26/14 1630 08/27/14 0345  NA 137 136 136 135  K 4.2 4.0 4.2 4.0  CL 102 105 102 100*  CO2 25 26 26 25   GLUCOSE 105* 122* 142* 124*  BUN 58* 51* 47* 46*  CREATININE 5.07* 4.44* 3.74* 3.35*  CALCIUM 7.2* 7.2* 7.4* 7.7*  PHOS 4.0  --  3.1 2.2*   Liver  Function Tests:  Recent Labs Lab 08/21/14 1700  08/25/14 0430  08/26/14 0420 08/26/14 1630 08/27/14 0345  AST 31  --  17  --  20  --   --   ALT 40  --  15*  --  14*  --   --   ALKPHOS 116  --  85  --  77  --   --   BILITOT 0.6  --  0.5  --  0.4  --   --   PROT 5.1*  --  5.0*  --  5.1*  --   --   ALBUMIN 1.8*  < > 1.7*  < > 1.7* 1.8* 1.9*  < > = values in this interval not displayed. No results for input(s): LIPASE, AMYLASE in the last 168 hours. No results for input(s): AMMONIA in the last 168 hours. CBC:  Recent Labs Lab 08/21/14 1700 08/22/14 0445 08/23/14 0500 08/24/14 0400 08/25/14 0430 08/26/14 0420  WBC 7.2 8.4 10.1 11.1* 8.7 7.5  NEUTROABS 6.4  --   --   --  7.2 6.0  HGB 9.5* 12.5* 12.8* 10.0* 8.8* 8.6*  HCT 28.3* 36.5* 37.8* 30.5* 27.2* 26.3*  MCV 79.9 78.0 78.9 81.3 83.4  83.5  PLT 276 329 366 292 193 174   Cardiac Enzymes:  Recent Labs Lab 08/21/14 0149 08/21/14 0802 08/21/14 1700 08/21/14 2050 08/22/14 0445  TROPONINI 0.05* 0.04* 0.06* 0.17* 0.15*   CBG:  Recent Labs Lab 08/26/14 1218 08/26/14 1602 08/26/14 1956 08/26/14 2320 08/27/14 0326  GLUCAP 120* 138* 116* 132* 111*    Iron Studies:   Recent Labs  08/27/14 0345  IRON 21*  TIBC 227*  FERRITIN 118   Studies/Results: Dg Chest Port 1 View  08/26/2014   CLINICAL DATA:  CHF, acute respiratory failure, anasarca  EXAM: PORTABLE CHEST - 1 VIEW  COMPARISON:  Portable chest x-ray of August 25, 2014  FINDINGS: The lungs remain hypoinflated. There are persistent bilateral pleural effusions as well bibasilar atelectasis or infiltrate. There is no pneumothorax. The cardiac silhouette is largely obscured. The central pulmonary vascularity is more distinct today. The endotracheal tube tip lies 2 cm above the carina. The esophagogastric tube tip projects below the inferior margin of the image. The left internal jugular venous catheter tip projects over the midportion of the SVC. The large caliber  dual-lumen right internal jugular catheter projects over lower third of the SVC.  IMPRESSION: Slight interval improvement in the appearance of the will pulmonary interstitium and central pulmonary vascularity may reflect improving pulmonary edema. Bilateral pleural effusions and bibasilar atelectasis or infiltrate persists. The support tubes are in reasonable position.   Electronically Signed   By: David  Martinique M.D.   On: 08/26/2014 07:12   Dg Abd Portable 1v  08/25/2014   CLINICAL DATA:  Ileus.  EXAM: PORTABLE ABDOMEN - 1 VIEW  COMPARISON:  08/21/2014  FINDINGS: NG tube remains in the stomach. There is a nonobstructive bowel gas pattern. No gaseous distention to suggest ileus. No free air or visible organomegaly or suspicious calcification. No acute bony abnormality.  IMPRESSION: No evidence of obstruction or ileus.   Electronically Signed   By: Rolm Baptise M.D.   On: 08/25/2014 12:43   Medications: Infusions: . sodium chloride Stopped (08/24/14 0300)  . fentaNYL infusion INTRAVENOUS 300 mcg/hr (08/27/14 0500)  . dialysis replacement fluid (prismasate) 300 mL/hr at 08/26/14 2301  . dialysis replacement fluid (prismasate) 200 mL/hr at 08/27/14 0231  . dialysate (PRISMASATE) 2,000 mL/hr at 08/27/14 0707    Scheduled Medications: . antiseptic oral rinse  7 mL Mouth Rinse 10 times per day  . chlorhexidine  15 mL Mouth Rinse BID  . darbepoetin (ARANESP) injection - NON-DIALYSIS  150 mcg Subcutaneous Q Tue-1800  . feeding supplement (NEPRO CARB STEADY)  1,000 mL Per Tube Q24H  . feeding supplement (PRO-STAT SUGAR FREE 64)  60 mL Per Tube 5 X Daily  . heparin  5,000 Units Subcutaneous 3 times per day  . insulin aspart  0-20 Units Subcutaneous 6 times per day  . metoprolol tartrate  50 mg Per Tube BID  . pantoprazole sodium  40 mg Per Tube Q24H  . piperacillin-tazobactam  3.375 g Intravenous 4 times per day  . sodium chloride  10-40 mL Intracatheter Q12H    have reviewed scheduled and prn  medications.  Physical Exam: General: eyes open- follows commands Heart: RRR Lungs: dec BS at bases Abdomen: distended  Extremities: massive pitting edema Dialysis Access: right sided PC    08/27/2014,7:19 AM  LOS: 7 days

## 2014-08-28 LAB — RENAL FUNCTION PANEL
ANION GAP: 7 (ref 5–15)
Albumin: 2 g/dL — ABNORMAL LOW (ref 3.5–5.0)
Albumin: 2 g/dL — ABNORMAL LOW (ref 3.5–5.0)
Anion gap: 6 (ref 5–15)
BUN: 41 mg/dL — AB (ref 6–20)
BUN: 44 mg/dL — ABNORMAL HIGH (ref 6–20)
CHLORIDE: 103 mmol/L (ref 101–111)
CHLORIDE: 105 mmol/L (ref 101–111)
CO2: 27 mmol/L (ref 22–32)
CO2: 27 mmol/L (ref 22–32)
CREATININE: 2.49 mg/dL — AB (ref 0.61–1.24)
Calcium: 8 mg/dL — ABNORMAL LOW (ref 8.9–10.3)
Calcium: 8.2 mg/dL — ABNORMAL LOW (ref 8.9–10.3)
Creatinine, Ser: 2.52 mg/dL — ABNORMAL HIGH (ref 0.61–1.24)
GFR calc Af Amer: 30 mL/min — ABNORMAL LOW (ref >60)
GFR calc Af Amer: 31 mL/min — ABNORMAL LOW (ref >60)
GFR calc non Af Amer: 26 mL/min — ABNORMAL LOW (ref >60)
GFR, EST NON AFRICAN AMERICAN: 27 mL/min — AB (ref >60)
GLUCOSE: 157 mg/dL — AB (ref 65–99)
Glucose, Bld: 85 mg/dL (ref 65–99)
POTASSIUM: 4.1 mmol/L (ref 3.5–5.1)
Phosphorus: 3.3 mg/dL (ref 2.5–4.6)
Phosphorus: 3.8 mg/dL (ref 2.5–4.6)
Potassium: 4 mmol/L (ref 3.5–5.1)
Sodium: 137 mmol/L (ref 135–145)
Sodium: 138 mmol/L (ref 135–145)

## 2014-08-28 LAB — CBC
HEMATOCRIT: 34.1 % — AB (ref 39.0–52.0)
Hemoglobin: 10.7 g/dL — ABNORMAL LOW (ref 13.0–17.0)
MCH: 26.3 pg (ref 26.0–34.0)
MCHC: 31.4 g/dL (ref 30.0–36.0)
MCV: 83.8 fL (ref 78.0–100.0)
PLATELETS: 236 10*3/uL (ref 150–400)
RBC: 4.07 MIL/uL — AB (ref 4.22–5.81)
RDW: 19.6 % — AB (ref 11.5–15.5)
WBC: 8.6 10*3/uL (ref 4.0–10.5)

## 2014-08-28 LAB — GLUCOSE, CAPILLARY
GLUCOSE-CAPILLARY: 172 mg/dL — AB (ref 65–99)
GLUCOSE-CAPILLARY: 166 mg/dL — AB (ref 65–99)
Glucose-Capillary: 138 mg/dL — ABNORMAL HIGH (ref 65–99)
Glucose-Capillary: 95 mg/dL (ref 65–99)
Glucose-Capillary: 106 mg/dL — ABNORMAL HIGH (ref 65–99)

## 2014-08-28 LAB — MAGNESIUM: MAGNESIUM: 2.4 mg/dL (ref 1.7–2.4)

## 2014-08-28 MED ORDER — PIPERACILLIN-TAZOBACTAM 3.375 G IVPB 30 MIN
3.3750 g | Freq: Four times a day (QID) | INTRAVENOUS | Status: DC
  Administered 2014-08-28 – 2014-08-29 (×4): 3.375 g via INTRAVENOUS
  Filled 2014-08-28 (×9): qty 50

## 2014-08-28 MED ORDER — FENTANYL CITRATE (PF) 100 MCG/2ML IJ SOLN: 25.0000 ug | INTRAMUSCULAR | Status: DC | PRN

## 2014-08-28 MED ORDER — CETYLPYRIDINIUM CHLORIDE 0.05 % MT LIQD
7.0000 mL | Freq: Two times a day (BID) | OROMUCOSAL | Status: DC
  Administered 2014-08-28 – 2014-09-07 (×18): 7 mL via OROMUCOSAL

## 2014-08-28 MED ORDER — PIPERACILLIN-TAZOBACTAM 3.375 G IVPB 30 MIN: 3.3750 g | Freq: Four times a day (QID) | INTRAVENOUS | Status: DC

## 2014-08-28 NOTE — Progress Notes (Addendum)
Subjective:  Extubated. Nods head to answer questions. No SOB.   Objective:  Vital Signs in the last 24 hours: Temp:  [97.1 F (36.2 C)-97.6 F (36.4 C)] 97.6 F (36.4 C) (08/03 0759) Pulse Rate:  [56-100] 85 (08/03 1031) Resp:  [11-18] 18 (08/03 1031) BP: (108-182)/(69-112) 155/97 mmHg (08/03 1005) SpO2:  [98 %-100 %] 100 % (08/03 1031) FiO2 (%):  [30 %-40 %] 30 % (08/03 1005) Weight:  [203 lb 14.8 oz (92.5 kg)] 203 lb 14.8 oz (92.5 kg) (08/03 0300)  Intake/Output from previous day: 08/02 0701 - 08/03 0700 In: 1742.9 [I.V.:303.4; NG/GT:850; IV Piggyback:589.5] Out: [id number]    Physical Exam: General: Well developed, well nourished, in no distress. Head:  Normocephalic and atraumatic.  Lungs: Clear to auscultation and percussion. Heart: Normal S1 and S2.  No murmur, rubs or gallops.  Abdomen: soft, non-tender, positive bowel sounds. Obese/anasarca Extremities: No clubbing or cyanosis. 2+ edema. Neurologic: follows commands R subclav cath    Lab Results:  Recent Labs  08/26/14 0420 08/28/14 0420  WBC 7.5 8.6  HGB 8.6* 10.7*  PLT 174 236    Recent Labs  08/27/14 1545 08/28/14 0420  NA 137 137  K 3.9 4.1  CL 101 103  CO2 27 27  GLUCOSE 135* 157*  BUN 44* 41*  CREATININE 2.77* 2.52*   No results for input(s): TROPONINI in the last 72 hours.  Invalid input(s): CK, MB Hepatic Function Panel  Recent Labs  08/26/14 0420  08/28/14 0420  PROT 5.1*  --   --   ALBUMIN 1.7*  < > 2.0*  AST 20  --   --   ALT 14*  --   --   ALKPHOS 77  --   --   BILITOT 0.4  --   --   < > = values in this interval not displayed. No results for input(s): CHOL in the last 72 hours. No results for input(s): PROTIME in the last 72 hours.  Imaging: No results found. Personally viewed.   Telemetry: No adverse arrhythmias, rare PAT Personally viewed.   EKG:  NSR  Cardiac Studies:  EF 10%, LVH  Scheduled Meds: . antiseptic oral rinse  7 mL Mouth Rinse 10 times per day    . chlorhexidine  15 mL Mouth Rinse BID  . darbepoetin (ARANESP) injection - NON-DIALYSIS  150 mcg Subcutaneous Q Tue-1800  . feeding supplement (NEPRO CARB STEADY)  1,000 mL Per Tube Q24H  . feeding supplement (PRO-STAT SUGAR FREE 64)  60 mL Per Tube 5 X Daily  . ferric gluconate (FERRLECIT/NULECIT) IV  125 mg Intravenous Daily  . heparin  5,000 Units Subcutaneous 3 times per day  . insulin aspart  0-20 Units Subcutaneous 6 times per day  . metoprolol tartrate  50 mg Per Tube BID  . pantoprazole sodium  40 mg Per Tube Q24H  . piperacillin-tazobactam  3.375 g Intravenous Q6H  . sodium chloride  10-40 mL Intracatheter Q12H   Continuous Infusions: . sodium chloride Stopped (08/24/14 0300)  . sodium chloride    . dialysis replacement fluid (prismasate) 300 mL/hr at 08/28/14 0917  . dialysis replacement fluid (prismasate) 200 mL/hr at 08/28/14 0425  . dialysate (PRISMASATE) 2,000 mL/hr at 08/28/14 1018   PRN Meds:.bisacodyl, fentaNYL (SUBLIMAZE) injection, heparin, hydrALAZINE, midazolam, [DISCONTINUED] ondansetron **OR** ondansetron (ZOFRAN) IV, sodium chloride, sodium chloride   Assessment/Plan:  Principal Problem:   Anasarca Active Problems:   Acute on chronic combined systolic and diastolic CHF, NYHA class  2   CKD (chronic kidney disease) stage 5, GFR less than 15 ml/min   Benign essential HTN   DM (diabetes mellitus), type 2, uncontrolled, with renal complications   Troponin level elevated   Anemia of chronic disease   Cardiorenal syndrome with renal failure   Ascites   Acute respiratory failure, unspecified whether with hypoxia or hypercapnia   Cardiac arrest   Pressure ulcer   Acute respiratory failure with hypoxia  59 year old with ESRD, s/p PEA arrest on 7/27 following dialysis cath placement post arctic sun protocol, EF 10%.  1. Cardiac arrest - PEA, bradycardic. Post arctic sun. Follows commands. Improving neurologically.  2. Cardiomyopathy - acute on chronic  systolic heart failure  - EF 10%  - metoprolol, Bb  - no ACE-I secondary to renal function  - will have EP see him as outpatient in more elective setting once his neurologic status is confirmed and he is able to tolerate HD.  - Narrow QRS (no BiV ICD)  - Dialysis for volume control  - May need cath in future to exclude CAD. 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     3. Acute respiratory failure  - vent per CCM  4. Possible SBP as well as aspiration  - ABX per CCM  5. Encephalopathy   - seems to be slowly improving.   - Zosyn  6. Essential hypertension  - Challenging to control with massive volume overload. As HD continues and volume removed, I suspect BP will improve.   Will follow.    SKAINS, [place] 08/28/2014, 10:31 AM

## 2014-08-28 NOTE — Procedures (Signed)
Extubation Procedure Note  Patient Details:   Name: Kyle [name] DOB: 1955-12-11 MRN: [id number]   Airway Documentation:     Evaluation  O2 sats: stable throughout Complications: No apparent complications Patient did tolerate procedure well. Bilateral Breath Sounds: Clear Suctioning: Oral, Airway Yes   Timeout performed. Patient was placed on 3 L [place].  Vitals remained stable throughout and not complications noted. RT will continue to monitor patient.   Lamonte Sakai 08/28/2014, 11:18 AM

## 2014-08-28 NOTE — Progress Notes (Signed)
PULMONARY / CRITICAL CARE MEDICINE   Name: Kyle [name] MRN: [id number] DOB: 03/16/1955    ADMISSION DATE:  08/20/2014 CONSULTATION DATE:  08/20/2014  REFERRING MD :  Ree Kida   CHIEF COMPLAINT:  PEA arrest    INITIAL PRESENTATION:   38 with hx of ESRD presented for tunneled HD catheter.  Developed bradycardic PEA arrest >> about 10 minutes for ROSC.  Hx of systolic CHF with baseline EF 15%.  STUDIES:  7/27 paracentesis 7/28 Echo >> EF 10%  SIGNIFICANT EVENTS: 7/27 Cardiac arrest, intubated and to ICU, hypothermia 7/29 rewarmed 7/30 more agitated  SUBJECTIVE:  Patient on Fentanyl gtt overnight for patient comfort. Denies any pain or breathing difficulties this morning.  ROS: Unobtainable as the patient is currently intubated.  VITAL SIGNS: Temp:  [97.1 F (36.2 C)-97.6 F (36.4 C)] 97.6 F (36.4 C) (08/03 0759) Pulse Rate:  [56-79] 65 (08/03 0800) Resp:  [11-18] 11 (08/03 0800) BP: (108-182)/(70-112) 121/70 mmHg (08/03 0800) SpO2:  [99 %-100 %] 99 % (08/03 0800) FiO2 (%):  [40 %] 40 % (08/03 0800) Weight:  [203 lb 14.8 oz (92.5 kg)] 203 lb 14.8 oz (92.5 kg) (08/03 0300)   HEMODYNAMICS:    VENTILATOR SETTINGS: Vent Mode:  [-] PRVC FiO2 (%):  [40 %] 40 % Set Rate:  [12 bmp] 12 bmp Vt Set:  [620 mL] 620 mL PEEP:  [0 cmH20-5 cmH20] 5 cmH20 Pressure Support:  [0 cmH20] 0 cmH20 Plateau Pressure:  [17 cmH20-25 cmH20] 25 cmH20   INTAKE / OUTPUT:  Intake/Output Summary (Last 24 hours) at 08/28/14 0904 Last data filed at 08/28/14 0900  Gross per 24 hour  Intake   1716 ml  Output   9012 ml  Net  -7296 ml   PHYSICAL EXAMINATION: General:  Awake. Alert. No family at bedside.  Integument:  Warm & dry. No rash on exposed skin. HEENT:  Moist mucus membranes. Endotracheal tube in place. No scleral icterus. Cardiovascular:  Regular rate. Anasarca resolved. Unable to appreciate JVD.  Pulmonary:  Good aeration & clear to auscultation. Symmetric chest wall rise. No  accessory muscle use on ventilator w/ PS 0/0. Abdomen: Soft. Normal bowel sounds. Nondistended.  Neurological:  Patient is nodding to questions. Follows commands when toes on command as well as giving thumbs up on appropriate hand.  LABS:  CBC  Recent Labs Lab 08/25/14 0430 08/26/14 0420 08/28/14 0420  WBC 8.7 7.5 8.6  HGB 8.8* 8.6* 10.7*  HCT 27.2* 26.3* 34.1*  PLT 193 174 236   Coag's  Recent Labs Lab 08/21/14 1700 08/22/14 0115  APTT 34 35  INR 1.30 1.29   BMET  Recent Labs Lab 08/27/14 0345 08/27/14 1545 08/28/14 0420  NA 135 137 137  K 4.0 3.9 4.1  CL 100* 101 103  CO2 25 27 27   BUN 46* 44* 41*  CREATININE 3.35* 2.77* 2.52*  GLUCOSE 124* 135* 157*   Electrolytes  Recent Labs Lab 08/26/14 0420  08/27/14 0345 08/27/14 1545 08/28/14 0420  CALCIUM 7.2*  < > 7.7* 7.6* 8.0*  MG 2.0  --  2.2  --  2.4  PHOS  --   < > 2.2* 3.5 3.3  < > = values in this interval not displayed. Sepsis Markers  Recent Labs Lab 08/21/14 1656 08/21/14 1700 08/21/14 2050  LATICACIDVEN 1.51 1.7 1.0   ABG  Recent Labs Lab 08/22/14 0850 08/23/14 0342 08/25/14 1045  PHART 7.454* 7.440 7.393  PCO2ART 24.1* 25.0* 38.9  PO2ART 172* 123.0* 162*  Liver Enzymes  Recent Labs Lab 08/21/14 1700  08/25/14 0430  08/26/14 0420  08/27/14 0345 08/27/14 1545 08/28/14 0420  AST 31  --  17  --  20  --   --   --   --   ALT 40  --  15*  --  14*  --   --   --   --   ALKPHOS 116  --  85  --  77  --   --   --   --   BILITOT 0.6  --  0.5  --  0.4  --   --   --   --   ALBUMIN 1.8*  < > 1.7*  < > 1.7*  < > 1.9* 1.9* 2.0*  < > = values in this interval not displayed. Cardiac Enzymes  Recent Labs Lab 08/21/14 1700 08/21/14 2050 08/22/14 0445  TROPONINI 0.06* 0.17* 0.15*   Glucose  Recent Labs Lab 08/27/14 1220 08/27/14 1543 08/27/14 1943 08/27/14 2311 08/28/14 0334 08/28/14 0800  GLUCAP 212* 136* 111* 118* 172* 166*    Imaging No results found.   ASSESSMENT  / PLAN:  PULMONARY ETT 7/27>>> A: Acute respiratory failure after cardiac arrest.pleural effusion rt?, pulm edema, gross overlaod Failed SBT due to absence of cuff leak 8/2  P:   Full vent support  Continue diuresis with CVVH today SBT today ongoing & eval for cuff leak S/P Decadron x2 doses  CARDIOVASCULAR Rt IJ HD cath 7/27 >>  Lt IJ CVL 7/27 >> Rt femoral aline 7/27 >> 7/30 A: PEA cardiac arrest. Acute on chronic combined CHF. Hx of HTN.  P:  PRN hydralazine Cardiology following Lopressor twice a day via tube Continuing Diuresis with CVVHD today & HD tomorrow  RENAL A: ESRD. Non gap metabolic acidosis - resolved.  P:   Continuing CVVHD until filter clots Nephrology following Continuing to monitor electrolytes t HD tomorrow  GASTROINTESTINAL A: Ascites - Doubt SBP given coag neg staph  P:   Tube feeds on hold pending extubation Protonix for SUP Neg balance continues  HEMATOLOGIC A: Anemia of chronic disease. Some loss blood from cvvhd lack of return to filter  P:  Trending hemoglobin daily with CBC Heparin subcutaneous every 8 hours  SCDs  INFECTIOUS A: Possible SBP >> GPC in clusters in peritoneal fluid, concern asp 7/30 Possible Aspiration Pneumonia  P:   Treating empirically w/ Zosyn for a total of 7 days Monitor for fever Monitor daily WBC with CBC  Peritoneal fluid 7/27 >> GPC in clusters >>coag neg staph Respiratory Ctx 7/30 >> C. albicans  Zosyn 7/30>>>  ENDOCRINE A: Hx of DM. Episodes of hypoglycemia - resolved.  P:   SSI w/  Accu-Cheks every 4 hours   NEUROLOGIC A: Acute encephalopathy after cardiac arrest - resolved.  P:   RASS goal 0 PRN fentanyl PRN versed D/C fentanyl gtt WUA    I have spent a total of 36 minutes of critical care time today caring for this patient,discussing the plan of care with nephrology, RT & RN, and reviewing the patient's electronic medical record.  Sonia Baller Ashok Cordia, M.D. [place]  [facility] Pager:  [phone] After 3pm or if no response, call [phone]

## 2014-08-28 NOTE — Progress Notes (Signed)
Wasted 100 mls of fentanyl 10 mcg/ml in sink. Witnessed by Julien Girt, RN and Heide Guile, RN.

## 2014-08-28 NOTE — Progress Notes (Signed)
PCCM Attending Extubation Note: Patient with cuff leak this morning. Successfully extubated to nasal cannula. No stridor. Wife notified by RN.  Sonia Baller Ashok Cordia, M.D. [facility] Pager:  [phone] After 3pm or if no response, call [phone]

## 2014-08-28 NOTE — Progress Notes (Signed)
Patient extubated at 1021 this morning. Patient comfortable on 3 L nasal cannula and VSS. Per Dr. Ashok Cordia, leave CRRT running until filter clots or discontinue at 1800 this evening per renal.

## 2014-08-28 NOTE — Progress Notes (Signed)
Subjective:  BP getting better- taking 300 per hour with CRRT- 7 liters down from yesterday  Objective Vital signs in last 24 hours: Filed Vitals:   08/28/14 0700 08/28/14 0735 08/28/14 0759 08/28/14 0800  BP: 108/71 109/75  121/70  Pulse: 56 60  65  Temp:   97.6 F (36.4 C)   TempSrc:   Oral   Resp: 13 12  11   Height:      Weight:      SpO2: 99% 100%  99%   Weight change: -7.6 kg (-16 lb 12.1 oz)  Intake/Output Summary (Last 24 hours) at 08/28/14 0842 Last data filed at 08/28/14 0800  Gross per 24 hour  Intake 1737.5 ml  Output   9011 ml  Net -7273.5 ml    Assessment/ Plan: Pt is a 59 y.o. yo male with known stage 5 CKD who was admitted on 08/20/2014 with SOB and worsening renal function.  Then had cardiac arrest in the setting of having Permcath placed on 7/27  Assessment/Plan: 1. Renal- progressed to ESRD recently-  was placed on CRRT due to events - on since 7/27- responding well-able to remove much fluid but still has a ways to go.  Will look to transition to IHD- have left instructions to stop when filter clots or when extubated and plan for IHD tomorrow.  Will need OP HD arrangements and perm access eventually prior to discharge- maybe early next week  2. Anemia- related to events and CKD- iron low- repleting and have added aranesp.  Higher today 3. HTN/volume- massive volume overload- needs much volume removal- have been successful with CRRT- will do IHD tomorrow 4. Elytes-  K and phos stable-  5. VDRF- per CCM 6. S/p arrest- neuro improvements 7. ID- ?peritonitis- abx per CCM  [place] Osmer A    Labs: Basic Metabolic Panel:  Recent Labs Lab 08/27/14 0345 08/27/14 1545 08/28/14 0420  NA 135 137 137  K 4.0 3.9 4.1  CL 100* 101 103  CO2 25 27 27   GLUCOSE 124* 135* 157*  BUN 46* 44* 41*  CREATININE 3.35* 2.77* 2.52*  CALCIUM 7.7* 7.6* 8.0*  PHOS 2.2* 3.5 3.3   Liver Function Tests:  Recent Labs Lab 08/21/14 1700  08/25/14 0430  08/26/14 0420   08/27/14 0345 08/27/14 1545 08/28/14 0420  AST 31  --  17  --  20  --   --   --   --   ALT 40  --  15*  --  14*  --   --   --   --   ALKPHOS 116  --  85  --  77  --   --   --   --   BILITOT 0.6  --  0.5  --  0.4  --   --   --   --   PROT 5.1*  --  5.0*  --  5.1*  --   --   --   --   ALBUMIN 1.8*  < > 1.7*  < > 1.7*  < > 1.9* 1.9* 2.0*  < > = values in this interval not displayed. No results for input(s): LIPASE, AMYLASE in the last 168 hours. No results for input(s): AMMONIA in the last 168 hours. CBC:  Recent Labs Lab 08/21/14 1700  08/23/14 0500 08/24/14 0400 08/25/14 0430 08/26/14 0420 08/28/14 0420  WBC 7.2  < > 10.1 11.1* 8.7 7.5 8.6  NEUTROABS 6.4  --   --   --  7.2  6.0  --   HGB 9.5*  < > 12.8* 10.0* 8.8* 8.6* 10.7*  HCT 28.3*  < > 37.8* 30.5* 27.2* 26.3* 34.1*  MCV 79.9  < > 78.9 81.3 83.4 83.5 83.8  PLT 276  < > 366 292 193 174 236  < > = values in this interval not displayed. Cardiac Enzymes:  Recent Labs Lab 08/21/14 1700 08/21/14 2050 08/22/14 0445  TROPONINI 0.06* 0.17* 0.15*   CBG:  Recent Labs Lab 08/27/14 1543 08/27/14 1943 08/27/14 2311 08/28/14 0334 08/28/14 0800  GLUCAP 136* 111* 118* 172* 166*    Iron Studies:   Recent Labs  08/27/14 0345  IRON 21*  TIBC 227*  FERRITIN 118   Studies/Results: No results found. Medications: Infusions: . sodium chloride Stopped (08/24/14 0300)  . sodium chloride    . fentaNYL infusion INTRAVENOUS Stopped (08/28/14 0735)  . dialysis replacement fluid (prismasate) 300 mL/hr at 08/26/14 2301  . dialysis replacement fluid (prismasate) 200 mL/hr at 08/28/14 0425  . dialysate (PRISMASATE) 2,000 mL/hr at 08/28/14 0800    Scheduled Medications: . antiseptic oral rinse  7 mL Mouth Rinse 10 times per day  . chlorhexidine  15 mL Mouth Rinse BID  . darbepoetin (ARANESP) injection - NON-DIALYSIS  150 mcg Subcutaneous Q Tue-1800  . feeding supplement (NEPRO CARB STEADY)  1,000 mL Per Tube Q24H  . feeding  supplement (PRO-STAT SUGAR FREE 64)  60 mL Per Tube 5 X Daily  . ferric gluconate (FERRLECIT/NULECIT) IV  125 mg Intravenous Daily  . heparin  5,000 Units Subcutaneous 3 times per day  . insulin aspart  0-20 Units Subcutaneous 6 times per day  . metoprolol tartrate  50 mg Per Tube BID  . pantoprazole sodium  40 mg Per Tube Q24H  . piperacillin-tazobactam  3.375 g Intravenous 4 times per day  . sodium chloride  10-40 mL Intracatheter Q12H    have reviewed scheduled and prn medications.  Physical Exam: General: eyes open- follows commands Heart: RRR Lungs: dec BS at bases Abdomen: distended  Extremities: massive pitting edema Dialysis Access: right sided PC    08/28/2014,8:42 AM  LOS: 8 days

## 2014-08-29 ENCOUNTER — Inpatient Hospital Stay (HOSPITAL_COMMUNITY): Payer: 59

## 2014-08-29 ENCOUNTER — Ambulatory Visit (HOSPITAL_COMMUNITY): Payer: 59

## 2014-08-29 LAB — GLUCOSE, CAPILLARY
GLUCOSE-CAPILLARY: 116 mg/dL — AB (ref 65–99)
GLUCOSE-CAPILLARY: 119 mg/dL — AB (ref 65–99)
Glucose-Capillary: 103 mg/dL — ABNORMAL HIGH (ref 65–99)
Glucose-Capillary: 107 mg/dL — ABNORMAL HIGH (ref 65–99)
Glucose-Capillary: 110 mg/dL — ABNORMAL HIGH (ref 65–99)
Glucose-Capillary: 100 mg/dL — ABNORMAL HIGH (ref 65–99)
Glucose-Capillary: 90 mg/dL (ref 65–99)

## 2014-08-29 LAB — RENAL FUNCTION PANEL
Albumin: 2 g/dL — ABNORMAL LOW (ref 3.5–5.0)
Anion gap: 9 (ref 5–15)
BUN: 53 mg/dL — ABNORMAL HIGH (ref 6–20)
CALCIUM: 8.2 mg/dL — AB (ref 8.9–10.3)
CHLORIDE: 104 mmol/L (ref 101–111)
CO2: 25 mmol/L (ref 22–32)
Creatinine, Ser: 3.03 mg/dL — ABNORMAL HIGH (ref 0.61–1.24)
GFR calc Af Amer: 24 mL/min — ABNORMAL LOW (ref >60)
GFR calc non Af Amer: 21 mL/min — ABNORMAL LOW (ref >60)
GLUCOSE: 101 mg/dL — AB (ref 65–99)
Phosphorus: 3.8 mg/dL (ref 2.5–4.6)
Potassium: 4.1 mmol/L (ref 3.5–5.1)
SODIUM: 138 mmol/L (ref 135–145)

## 2014-08-29 LAB — MAGNESIUM: Magnesium: 2.3 mg/dL (ref 1.7–2.4)

## 2014-08-29 MED ORDER — CARVEDILOL 6.25 MG PO TABS
6.2500 mg | ORAL_TABLET | Freq: Two times a day (BID) | ORAL | Status: DC
  Filled 2014-08-29 (×2): qty 1

## 2014-08-29 MED ORDER — NEPRO/CARBSTEADY PO LIQD
237.0000 mL | Freq: Two times a day (BID) | ORAL | Status: DC
  Administered 2014-08-30: 237 mL via ORAL
  Filled 2014-08-29 (×5): qty 237

## 2014-08-29 MED ORDER — HEPARIN SODIUM (PORCINE) 1000 UNIT/ML DIALYSIS
20.0000 [IU]/kg | INTRAMUSCULAR | Status: DC | PRN
  Filled 2014-08-29: qty 2

## 2014-08-29 MED ORDER — SODIUM CHLORIDE 0.9 % IV SOLN: 100.0000 mL | INTRAVENOUS | Status: DC | PRN

## 2014-08-29 MED ORDER — ALTEPLASE 2 MG IJ SOLR
2.0000 mg | Freq: Once | INTRAMUSCULAR | Status: AC | PRN
  Filled 2014-08-29: qty 2

## 2014-08-29 MED ORDER — PIPERACILLIN-TAZOBACTAM IN DEX 2-0.25 GM/50ML IV SOLN
2.2500 g | Freq: Three times a day (TID) | INTRAVENOUS | Status: AC
  Administered 2014-08-29 – 2014-08-30 (×5): 2.25 g via INTRAVENOUS
  Filled 2014-08-29 (×5): qty 50

## 2014-08-29 MED ORDER — LIDOCAINE HCL (PF) 1 % IJ SOLN: 5.0000 mL | INTRAMUSCULAR | Status: DC | PRN

## 2014-08-29 MED ORDER — PENTAFLUOROPROP-TETRAFLUOROETH EX AERO: 1.0000 "application " | INHALATION_SPRAY | CUTANEOUS | Status: DC | PRN

## 2014-08-29 MED ORDER — HEPARIN SODIUM (PORCINE) 1000 UNIT/ML DIALYSIS
1000.0000 [IU] | INTRAMUSCULAR | Status: DC | PRN
  Filled 2014-08-29: qty 1

## 2014-08-29 MED ORDER — INSULIN ASPART 100 UNIT/ML ~~LOC~~ SOLN
0.0000 [IU] | Freq: Three times a day (TID) | SUBCUTANEOUS | Status: DC
  Administered 2014-08-30 – 2014-09-01 (×5): 3 [IU] via SUBCUTANEOUS
  Administered 2014-09-02 (×2): 4 [IU] via SUBCUTANEOUS
  Administered 2014-09-02: 3 [IU] via SUBCUTANEOUS
  Administered 2014-09-03: 4 [IU] via SUBCUTANEOUS
  Administered 2014-09-03: 15 [IU] via SUBCUTANEOUS
  Administered 2014-09-04: 3 [IU] via SUBCUTANEOUS
  Administered 2014-09-04: 11 [IU] via SUBCUTANEOUS
  Administered 2014-09-05: 7 [IU] via SUBCUTANEOUS
  Administered 2014-09-05: 3 [IU] via SUBCUTANEOUS
  Administered 2014-09-05: 4 [IU] via SUBCUTANEOUS
  Administered 2014-09-06: 3 [IU] via SUBCUTANEOUS
  Administered 2014-09-06: 9 [IU] via SUBCUTANEOUS
  Administered 2014-09-07 (×3): 7 [IU] via SUBCUTANEOUS

## 2014-08-29 MED ORDER — NEPRO/CARBSTEADY PO LIQD
237.0000 mL | ORAL | Status: DC | PRN
  Filled 2014-08-29: qty 237

## 2014-08-29 MED ORDER — LIDOCAINE-PRILOCAINE 2.5-2.5 % EX CREA
1.0000 "application " | TOPICAL_CREAM | CUTANEOUS | Status: DC | PRN
  Filled 2014-08-29: qty 5

## 2014-08-29 NOTE — Progress Notes (Signed)
SLP Cancellation Note  Patient Details Name: Kyle [name] MRN: [id number] DOB: Jun 28, 1955   Cancelled treatment:       Reason Eval/Treat Not Completed: Medical issues which prohibited therapy. RN reported that pt just had a fall, asked that we evaluate later today. Will reattempt if time allows.   Kern Reap, MA, CCC-SLP 08/29/2014, 1:40 PM [phone]

## 2014-08-29 NOTE — Progress Notes (Signed)
   08/29/14 1300  What Happened  Was fall witnessed? No  Was patient injured? No  Patient found on floor  Found by Staff-comment Balinda Quails, RN)  Stated prior activity other (comment) (Bedrest)  Follow Up  MD notified Dr. Ashok Cordia, PCCM (MD at bedside)  Time MD notified 1320  Family notified Yes-comment (Wife, Hinton Dyer called and notified @ 1400)  Time family notified 1345  Additional tests Yes-comment (Head CT ordered)  Progress note created (see row info) Yes  Vitals  BP (!) 169/116 mmHg  MAP (mmHg) 135  Pulse Rate (!) 118  ECG Heart Rate (!) 119  Resp (!) 21  Oxygen Therapy  SpO2 100 %  O2 Device Nasal Cannula  O2 Flow Rate (L/min) 2 L/min

## 2014-08-29 NOTE — Progress Notes (Addendum)
PCCM Attending Note: Notified by RN patient had unwitnessed fall. Found on the floor. Patient conscious but remains disoriented. Grossly moving all 4 extremities. Patient also pulled out his central line. Plan to obtain peripheral IV access. Stat CT head & neck without contrast to rule out fracture/bleeding.  Sonia Baller Ashok Cordia, M.D. [facility] Pager:  [phone] After 3pm or if no response, call [phone]

## 2014-08-29 NOTE — Progress Notes (Addendum)
Nutrition Follow-up  DOCUMENTATION CODES:   Obesity unspecified  INTERVENTION:   Nepro Shake po BID, each supplement provides 425 kcal and 19 grams protein   NUTRITION DIAGNOSIS:   Predicted suboptimal nutrient intake related to lethargy/confusion as evidenced by  (chart review).  Ongoing  GOAL:   Patient will meet greater than or equal to 90% of their needs  Unmet  MONITOR:   PO intake, Supplement acceptance, Diet advancement, Labs, Weight trends, Skin, I & O's  REASON FOR ASSESSMENT:   Ventilator    ASSESSMENT:   Kyle [name] is a 59 yo male with PMHx of CKD Stage V and chronic systolic and diastolic CHF (EF 0000000) who was admitted for acute on chronic kidney and heart failure. Patient was anasarcous on admission and went for paracentesis yesterday. He later went for placement of a tunneled HD catheter and developed bradycardia after placement and went into PEA arrest of unclear etiology. Patient was intubated, resuscitated and place on hypothermia protocol.   Pt extubated on 08/28/14. Nutritional needs re-estimated due to changes in status.   Pt moving around in bed and confused at time of visit. Per CCM notes, pt with fluid overload. Plan is continue CRRT today and transition to HD tomorrow.   Addendum: Pt fell this afternoon and pulled out central line. MD ordered CT scan of head and neck.   Pt was just advanced to a renal diet. RD will supplement to optimize nutritional intake.   Diet Order:  Diet renal/carb modified with fluid restriction Diet-HS Snack?: Nothing; Room service appropriate?: Yes; Fluid consistency:: Thin  Skin:  Wound (see comment) (closed rt neck incision, stage I coccyx)  Last BM:  08/29/14  Height:   Ht Readings from Last 1 Encounters:  08/21/14 6' (1.829 m)    Weight:   Wt Readings from Last 1 Encounters:  08/29/14 193 lb 9 oz (87.8 kg)    Ideal Body Weight:  80.9 kg  BMI:  Body mass index is 26.25 kg/(m^2).  Estimated Nutritional  Needs:   Kcal:  2100-2300  Protein:  115-130 grams  Fluid:  per MD  EDUCATION NEEDS:   No education needs identified at this time  Rolene Andrades A. Jimmye Norman, RD, LDN, CDE Pager: [phone] After hours Pager: [phone]

## 2014-08-29 NOTE — Progress Notes (Addendum)
Progress Note    08/29/2014 9:27 AM 8 Days Post-Op     Filed Vitals:   08/29/14 0800  BP: 162/98  Pulse: 105  Temp:   Resp: 17    Physical Exam: Cardiac:  Regular Lungs:  Non labored Extremities:  2+ radial pulses bilaterally; 1+ ulnar pulses bilaterally; 1-2+ brachial pulses bilaterally.   CBC    Component Value Date/Time   WBC 8.6 08/28/2014 0420   RBC 4.07* 08/28/2014 0420   RBC 3.59* 01/15/2014 0415   HGB 10.7* 08/28/2014 0420   HCT 34.1* 08/28/2014 0420   PLT 236 08/28/2014 0420   MCV 83.8 08/28/2014 0420   MCH 26.3 08/28/2014 0420   MCHC 31.4 08/28/2014 0420   RDW 19.6* 08/28/2014 0420   LYMPHSABS 0.8 08/26/2014 0420   MONOABS 0.6 08/26/2014 0420   EOSABS 0.1 08/26/2014 0420   BASOSABS 0.0 08/26/2014 0420    BMET    Component Value Date/Time   NA 138 08/29/2014 0430   K 4.1 08/29/2014 0430   CL 104 08/29/2014 0430   CO2 25 08/29/2014 0430   GLUCOSE 101* 08/29/2014 0430   BUN 53* 08/29/2014 0430   CREATININE 3.03* 08/29/2014 0430   CALCIUM 8.2* 08/29/2014 0430   GFRNONAA 21* 08/29/2014 0430   GFRAA 24* 08/29/2014 0430    INR    Component Value Date/Time   INR 1.29 08/22/2014 0115     Intake/Output Summary (Last 24 hours) at 08/29/14 0927 Last data filed at 08/29/14 0800  Gross per 24 hour  Intake    310 ml  Output   2488 ml  Net  -2178 ml   Vein mapping 08/21/14: Right Arm: Cephalic  Segment Diameter Depth Comment  1. Axilla 2 mm mm   2. Proximal upper arm 1.3 mm mm   3. Mid upper arm 1.6 mm mm   4. Above AC 1.9 mm mm   5. AC mm mm IV  6. Below AC mm mm IV  7. Mid forearm 1.8 mm mm   8. Wrist 1.3 mm mm    mm mm    mm mm    Basilic  Segment Diameter Depth Comment  1. Mid upper arm 3.9 mm 6.7 mm Rouleaux flow  2. Above AC 4.8 mm 4.8 mm   3. AC 2.6 mm 3.2 mm Branch   4. Below AC 1.3 mm 2.6 mm        Left Arm: Cephalic  Segment Diameter Depth  Comment  1. Axilla 2.2 mm mm   2. Proximal upper arm 1.3 mm mm   3. Mid upper arm 1 mm mm   4. Above AC mm mm Too small to measure  5. AC mm mm Too small to measure  6. Below AC 1.1 mm mm   7. Mid forearm 1.6 mm mm   8. Wrist 1 mm mm    mm mm    mm mm    Basilic  Segment Diameter Depth Comment  1. Mid upper arm 4.1 mm 7.1 mm Rouleaux flow  2. Above AC 1.3 mm 5.3 mm   3. AC mm mm Too small to measure  4. Below AC mm mm Too small to measure        Assessment:  59 y.o. male is s/p:  Diatek catheter placement with PEA arrest and we are asked to re-evaluate pt for permanent access 8 Days Post-Op  Plan: -pt extubated yesterday and is mildly confused -pt may be candidate for right arm basilic fistula  vs graft, however, he is right handed. -will give pt time to recover more since extubation and possibly plan on access placement early next week. -will d/w Dr. Bridgett Larsson -DVT prophylaxis:  Heparin SQ -dialysis via [place], PA-C [facility] [phone] 08/29/2014 9:27 AM  Addendum  I have independently interviewed and examined the patient, and I agree with the physician assistant's findings.  No plan on permanent access until neurologically status improved and medically stable.  Adele Barthel, MD [facility] [place] Office: [phone] Pager: [phone]  08/29/2014, 10:41 AM

## 2014-08-29 NOTE — Progress Notes (Signed)
This RN called into room by second RN stating patient had an unwitnessed fall. Patient was found on the floor covered in stool and blood, central line appeared to have been pulled out by patient. Patient states he was trying to get up to go the bathroom. Patient was mildly confused prior to fall, but was following commands and cooperative. MD made aware and at bedside. New orders placed. Family called and notified. Will continue to monitor closely.

## 2014-08-29 NOTE — Progress Notes (Signed)
Subjective:  Extubated- CRRT stopped yesterday- alert- moving around Kyle [name] lot in bed- not sure he understands his situation or that he will be dialysis requiring from this day on Objective Vital signs in last 24 hours: Filed Vitals:   08/29/14 0300 08/29/14 0355 08/29/14 0400 08/29/14 0700  BP: 147/83  145/90 158/91  Pulse: 86  92 102  Temp:   98.3 F (36.8 C) 98.4 F (36.9 C)  TempSrc:   Oral Oral  Resp: 25  22 21   Height:      Weight:  87.8 kg (193 lb 9 oz)    SpO2: 93%  98% 96%   Weight change: -4.7 kg (-10 lb 5.8 oz)  Intake/Output Summary (Last 24 hours) at 08/29/14 [id number] Last data filed at 08/29/14 0000  Gross per 24 hour  Intake    330 ml  Output   2797 ml  Net  -2467 ml    Assessment/ Plan: Pt is Kyle [name] with known stage 5 CKD who was admitted on 08/20/2014 with SOB and worsening renal function.  Then had cardiac arrest in the setting of having Permcath placed on 7/27  Assessment/Plan: 1. Renal- progressed to ESRD recently-  was placed on CRRT due to events - on from 7/27-8/3- responded well-able to remove much fluid but still has Kyle [name] little ways to go.   plan for IHD today.  Will need OP HD arrangements and perm access eventually prior to discharge- maybe early next week ?  Will order vein mapping and get [facility] involved   2. Anemia- related to events and CKD- iron low- repleting and have added aranesp.  Higher today 3. HTN/volume- massive volume overload- needed much volume removal- have been successful with CRRT- will do IHD today 4. Elytes-  K and phos stable-  5. VDRF- now extubated 6. S/p arrest- neuro improvements- but seems somewhat confused this AM 7. ID- ?peritonitis- abx per CCM  Kyle Kyle [name]    Labs: Basic Metabolic Panel:  Recent Labs Lab 08/28/14 0420 08/28/14 1700 08/29/14 0430  NA 137 138 138  K 4.1 4.0 4.1  CL 103 105 104  CO2 27 27 25   GLUCOSE 157* 85 101*  BUN 41* 44* 53*  CREATININE 2.52* 2.49* 3.03*  CALCIUM 8.0* 8.2* 8.2*   PHOS 3.3 3.8 3.8   Liver Function Tests:  Recent Labs Lab 08/25/14 0430  08/26/14 0420  08/28/14 0420 08/28/14 1700 08/29/14 0430  AST 17  --  20  --   --   --   --   ALT 15*  --  14*  --   --   --   --   ALKPHOS 85  --  77  --   --   --   --   BILITOT 0.5  --  0.4  --   --   --   --   PROT 5.0*  --  5.1*  --   --   --   --   ALBUMIN 1.7*  < > 1.7*  < > 2.0* 2.0* 2.0*  < > = values in this interval not displayed. No results for input(s): LIPASE, AMYLASE in the last 168 hours. No results for input(s): AMMONIA in the last 168 hours. CBC:  Recent Labs Lab 08/23/14 0500 08/24/14 0400 08/25/14 0430 08/26/14 0420 08/28/14 0420  WBC 10.1 11.1* 8.7 7.5 8.6  NEUTROABS  --   --  7.2 6.0  --   HGB 12.8* 10.0* 8.8* 8.6* 10.7*  HCT 37.8* 30.5* 27.2* 26.3* 34.1*  MCV 78.9 81.3 83.4 83.5 83.8  PLT 366 292 193 174 236   Cardiac Enzymes: No results for input(s): CKTOTAL, CKMB, CKMBINDEX, TROPONINI in the last 168 hours. CBG:  Recent Labs Lab 08/28/14 1615 08/28/14 2007 08/28/14 2335 08/29/14 0340 08/29/14 0720  GLUCAP 95 106* 103* 107* 110*    Iron Studies:   Recent Labs  08/27/14 0345  IRON 21*  TIBC 227*  FERRITIN 118   Studies/Results: No results found. Medications: Infusions: . sodium chloride Stopped (08/24/14 0300)  . sodium chloride      Scheduled Medications: . antiseptic oral rinse  7 mL Mouth Rinse BID  . darbepoetin (ARANESP) injection - NON-DIALYSIS  150 mcg Subcutaneous Q Tue-1800  . ferric gluconate (FERRLECIT/NULECIT) IV  125 mg Intravenous Daily  . heparin  5,000 Units Subcutaneous 3 times per day  . insulin aspart  0-20 Units Subcutaneous 6 times per day  . metoprolol tartrate  50 mg Per Tube BID  . piperacillin-tazobactam  3.375 g Intravenous Q6H  . sodium chloride  10-40 mL Intracatheter Q12H    have reviewed scheduled and prn medications.  Physical Exam: General: extubated, alert but Kyle [name] little confused  Heart: RRR Lungs: dec BS at  bases Abdomen: distended  Extremities: massive pitting edema Dialysis Access: right sided PC    08/29/2014,8:21 AM  LOS: 9 days

## 2014-08-29 NOTE — Progress Notes (Signed)
Subjective:  Extubated. No CP. No SOB. Wants to get out of bed. Mildly confused.   Objective:  Vital Signs in the last 24 hours: Temp:  [97.3 F (36.3 C)-98.4 F (36.9 C)] 98.4 F (36.9 C) (08/04 0700) Pulse Rate:  [69-102] 102 (08/04 0700) Resp:  [14-29] 21 (08/04 0700) BP: (115-180)/(69-108) 158/91 mmHg (08/04 0700) SpO2:  [93 %-100 %] 96 % (08/04 0700) FiO2 (%):  [30 %] 30 % (08/03 1005) Weight:  [193 lb 9 oz (87.8 kg)] 193 lb 9 oz (87.8 kg) (08/04 0355)  Intake/Output from previous day: 08/03 0701 - 08/04 0700 In: 345.8 [I.V.:5.8; NG/GT:80; IV Piggyback:260] Out: 3122 [Urine:150]   Physical Exam: General: Well developed, well nourished, in no distress. Head:  Normocephalic and atraumatic.  Lungs: Clear to auscultation and percussion. Heart: Normal S1 and S2.  No murmur, rubs or gallops.  Abdomen: soft, non-tender, positive bowel sounds. Obese/anasarca Extremities: No clubbing or cyanosis. 2+ edema. Neurologic: follows commands R subclav cath    Lab Results:  Recent Labs  08/28/14 0420  WBC 8.6  HGB 10.7*  PLT 236    Recent Labs  08/28/14 1700 08/29/14 0430  NA 138 138  K 4.0 4.1  CL 105 104  CO2 27 25  GLUCOSE 85 101*  BUN 44* 53*  CREATININE 2.49* 3.03*   No results for input(s): TROPONINI in the last 72 hours.  Invalid input(s): CK, MB Hepatic Function Panel  Recent Labs  08/29/14 0430  ALBUMIN 2.0*   No results for input(s): CHOL in the last 72 hours. No results for input(s): PROTIME in the last 72 hours.  Imaging: No results found. Personally viewed.   Telemetry: No adverse arrhythmias, rare PAT Personally viewed.   EKG:  NSR  Cardiac Studies:  EF 10%, LVH  Scheduled Meds: . antiseptic oral rinse  7 mL Mouth Rinse BID  . darbepoetin (ARANESP) injection - NON-DIALYSIS  150 mcg Subcutaneous Q Tue-1800  . ferric gluconate (FERRLECIT/NULECIT) IV  125 mg Intravenous Daily  . heparin  5,000 Units Subcutaneous 3 times per day    . insulin aspart  0-20 Units Subcutaneous 6 times per day  . metoprolol tartrate  50 mg Per Tube BID  . piperacillin-tazobactam  3.375 g Intravenous Q6H  . sodium chloride  10-40 mL Intracatheter Q12H   Continuous Infusions: . sodium chloride Stopped (08/24/14 0300)  . sodium chloride     PRN Meds:.bisacodyl, fentaNYL (SUBLIMAZE) injection, heparin, hydrALAZINE, [DISCONTINUED] ondansetron **OR** ondansetron (ZOFRAN) IV, sodium chloride   Assessment/Plan:  Principal Problem:   Anasarca Active Problems:   Acute on chronic combined systolic and diastolic CHF, NYHA class 2   CKD (chronic kidney disease) stage 5, GFR less than 15 ml/min   Benign essential HTN   DM (diabetes mellitus), type 2, uncontrolled, with renal complications   Troponin level elevated   Anemia of chronic disease   Cardiorenal syndrome with renal failure   Ascites   Acute respiratory failure, unspecified whether with hypoxia or hypercapnia   Cardiac arrest   Pressure ulcer   Acute respiratory failure with hypoxia  59 year old with ESRD, s/p PEA arrest on 7/27 following dialysis cath placement post arctic sun protocol, EF 10%.  1. Cardiac arrest - PEA, bradycardic. Post arctic sun. Follows commands. Improving neurologically.  2. Cardiomyopathy - acute on chronic systolic heart failure  - EF 10%  - metoprolol, Bb - will change to Coreg 6.25 BID (was on Coreg previously). Increase as tolerated. Goal 25  BID.   - no ACE-I secondary to renal function  - will have EP see him as outpatient in more elective setting once his neurologic status is confirmed and he is able to tolerate HD.  - Narrow QRS (no BiV ICD)  - Dialysis for volume control  - May need cath in future to exclude CAD. 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     3. Acute respiratory failure  - vent per CCM  4. Possible SBP as well as aspiration  - ABX per CCM  - Zosyn  5. Encephalopathy   - seems to be slowly improving. Conversant today    6. Essential hypertension  -  I suspect BP will continue to improve with further volume control  Will follow.    SKAINS, Kyle [name] 08/29/2014, 8:48 AM

## 2014-08-29 NOTE — Progress Notes (Signed)
PULMONARY / CRITICAL CARE MEDICINE   Name: Kyle [name] MRN: [id number] DOB: 02-26-1955    ADMISSION DATE:  08/20/2014 CONSULTATION DATE:  08/20/2014  REFERRING MD :  Ree Kida   CHIEF COMPLAINT:  PEA arrest    INITIAL PRESENTATION:   24 with hx of ESRD presented for tunneled HD catheter.  Developed bradycardic PEA arrest >> about 10 minutes for ROSC.  Hx of systolic CHF with baseline EF 15%.  STUDIES:  7/27 paracentesis 7/28 Echo >> EF 10%  SIGNIFICANT EVENTS: 7/27 Cardiac arrest, intubated and to ICU, hypothermia 7/29 rewarmed 7/30 more agitated  SUBJECTIVE:  Patient alert this morning was slightly altered. Denies any difficulty breathing or cough. Denies any chest pain or pressure. Patient was successfully extubated yesterday to nasal cannula.  ROS: No nausea or vomiting. No subjective fever, chills, or sweats. Patient is slightly altered still.  VITAL SIGNS: Temp:  [97.3 F (36.3 C)-98.4 F (36.9 C)] 97.5 F (36.4 C) (08/04 1157) Pulse Rate:  [69-116] 116 (08/04 1157) Resp:  [14-31] 14 (08/04 1100) BP: (138-180)/(82-116) 138/116 mmHg (08/04 1100) SpO2:  [93 %-100 %] 99 % (08/04 1157) Weight:  [193 lb 9 oz (87.8 kg)] 193 lb 9 oz (87.8 kg) (08/04 0355)   HEMODYNAMICS:    VENTILATOR SETTINGS:     INTAKE / OUTPUT:  Intake/Output Summary (Last 24 hours) at 08/29/14 1200 Last data filed at 08/29/14 1019  Gross per 24 hour  Intake    310 ml  Output   1395 ml  Net  -1085 ml   PHYSICAL EXAMINATION: General:  Awake. Alert. No distress lying in bed watching TV. Integument:  Warm & dry. No rash on exposed skin. HEENT:  Moist mucus membranes. No oral ulcers. No scleral icterus. Cardiovascular:  Regular rate. Anasarca resolved. Unable to appreciate JVD.  Pulmonary:  Mildly decreased breath sounds bilateral lung bases. Symmetric chest wall rise. Normal work of breathing on nasal cannula oxygen. Abdomen: Soft. Normal bowel sounds. Nondistended.  Neurological:  Patient  grossly nonfocal. He is answering questions and following commands but is not entirely clear on the year or his location.   LABS:  CBC  Recent Labs Lab 08/25/14 0430 08/26/14 0420 08/28/14 0420  WBC 8.7 7.5 8.6  HGB 8.8* 8.6* 10.7*  HCT 27.2* 26.3* 34.1*  PLT 193 174 236   Coag's No results for input(s): APTT, INR in the last 168 hours. BMET  Recent Labs Lab 08/28/14 0420 08/28/14 1700 08/29/14 0430  NA 137 138 138  K 4.1 4.0 4.1  CL 103 105 104  CO2 27 27 25   BUN 41* 44* 53*  CREATININE 2.52* 2.49* 3.03*  GLUCOSE 157* 85 101*   Electrolytes  Recent Labs Lab 08/27/14 0345  08/28/14 0420 08/28/14 1700 08/29/14 0430  CALCIUM 7.7*  < > 8.0* 8.2* 8.2*  MG 2.2  --  2.4  --  2.3  PHOS 2.2*  < > 3.3 3.8 3.8  < > = values in this interval not displayed. Sepsis Markers No results for input(s): LATICACIDVEN, PROCALCITON, O2SATVEN in the last 168 hours. ABG  Recent Labs Lab 08/23/14 0342 08/25/14 1045  PHART 7.440 7.393  PCO2ART 25.0* 38.9  PO2ART 123.0* 162*   Liver Enzymes  Recent Labs Lab 08/25/14 0430  08/26/14 0420  08/28/14 0420 08/28/14 1700 08/29/14 0430  AST 17  --  20  --   --   --   --   ALT 15*  --  14*  --   --   --   --  ALKPHOS 85  --  77  --   --   --   --   BILITOT 0.5  --  0.4  --   --   --   --   ALBUMIN 1.7*  < > 1.7*  < > 2.0* 2.0* 2.0*  < > = values in this interval not displayed. Cardiac Enzymes No results for input(s): TROPONINI, PROBNP in the last 168 hours. Glucose  Recent Labs Lab 08/28/14 1124 08/28/14 1615 08/28/14 2007 08/28/14 2335 08/29/14 0340 08/29/14 0720  GLUCAP 138* 95 106* 103* 107* 110*    Imaging No results found.   ASSESSMENT / PLAN:  PULMONARY ETT 7/27>>> A: Acute respiratory failure after cardiac arrest.pleural effusion rt?, pulm edema, gross overlaod Failed SBT due to absence of cuff leak 8/2 - Now extubated.  P:   Continue to wean FiO2 for Sat >92% Continue diuresis with  HD  CARDIOVASCULAR Rt IJ HD cath 7/27 >>  Lt IJ CVL 7/27 >> Rt femoral aline 7/27 >> 7/30 A: PEA cardiac arrest. Acute on chronic combined CHF. Hx of HTN.  P:  PRN hydralazine Cardiology following Coreg bid Continuing Diuresis with CVVHD today & HD tomorrow  RENAL A: ESRD - on HD Non gap metabolic acidosis - resolved.  P:   Nephrology following Continuing to monitor electrolytes daily HD   GASTROINTESTINAL A: Ascites - Doubt SBP given coag neg staph Swallow eval & initiate diet  P:   Renal diet if patient passes swallow evaluation Protonix for SUP Neg balance continues  HEMATOLOGIC A: Anemia of chronic disease. Some loss blood from cvvhd lack of return to filter  P:  Trending hemoglobin daily with CBC Heparin subcutaneous every 8 hours  SCDs  INFECTIOUS A: Possible SBP >> GPC in clusters in peritoneal fluid, concern asp 7/30 Possible Aspiration Pneumonia  P:   Empiric Zosyn Day 6/7 Monitor for fever Monitor daily WBC with CBC  Peritoneal fluid 7/27 >> GPC in clusters >>coag neg staph Respiratory Ctx 7/30 >> C. albicans  Zosyn 7/30>>>  ENDOCRINE A: Hx of DM. Episodes of hypoglycemia - resolved.  P:   SSI  Changing accuchecks to qAC & HS  NEUROLOGIC A: Acute encephalopathy after cardiac arrest - Improving.  P:   RASS goal 0 Monitor closely  I have spent a total of 31 minutes of critical care time today caring for this patient,discussing the plan of care with RN and reviewing the patient's electronic medical record.  Sonia Baller Ashok Cordia, M.D. [facility] Pager:  [phone] After 3pm or if no response, call [phone]

## 2014-08-30 LAB — CBC WITH DIFFERENTIAL/PLATELET
BASOS ABS: 0 10*3/uL (ref 0.0–0.1)
BASOS PCT: 0 % (ref 0–1)
Eosinophils Absolute: 0 10*3/uL (ref 0.0–0.7)
Eosinophils Relative: 0 % (ref 0–5)
HCT: 29.1 % — ABNORMAL LOW (ref 39.0–52.0)
Hemoglobin: 9.4 g/dL — ABNORMAL LOW (ref 13.0–17.0)
Lymphocytes Relative: 8 % — ABNORMAL LOW (ref 12–46)
Lymphs Abs: 0.9 10*3/uL (ref 0.7–4.0)
MCH: 26.8 pg (ref 26.0–34.0)
MCHC: 32.3 g/dL (ref 30.0–36.0)
MCV: 82.9 fL (ref 78.0–100.0)
MONO ABS: 1.2 10*3/uL — AB (ref 0.1–1.0)
Monocytes Relative: 10 % (ref 3–12)
NEUTROS ABS: 9.7 10*3/uL — AB (ref 1.7–7.7)
Neutrophils Relative %: 82 % — ABNORMAL HIGH (ref 43–77)
PLATELETS: 254 10*3/uL (ref 150–400)
RBC: 3.51 MIL/uL — ABNORMAL LOW (ref 4.22–5.81)
RDW: 19.5 % — ABNORMAL HIGH (ref 11.5–15.5)
WBC: 11.9 10*3/uL — ABNORMAL HIGH (ref 4.0–10.5)

## 2014-08-30 LAB — GLUCOSE, CAPILLARY
GLUCOSE-CAPILLARY: 104 mg/dL — AB (ref 65–99)
GLUCOSE-CAPILLARY: 123 mg/dL — AB (ref 65–99)
GLUCOSE-CAPILLARY: 86 mg/dL (ref 65–99)
GLUCOSE-CAPILLARY: 95 mg/dL (ref 65–99)
GLUCOSE-CAPILLARY: 96 mg/dL (ref 65–99)
Glucose-Capillary: 107 mg/dL — ABNORMAL HIGH (ref 65–99)

## 2014-08-30 LAB — RENAL FUNCTION PANEL
ALBUMIN: 2.3 g/dL — AB (ref 3.5–5.0)
Anion gap: 14 (ref 5–15)
BUN: 32 mg/dL — ABNORMAL HIGH (ref 6–20)
CALCIUM: 8.2 mg/dL — AB (ref 8.9–10.3)
CHLORIDE: 103 mmol/L (ref 101–111)
CO2: 23 mmol/L (ref 22–32)
CREATININE: 2.76 mg/dL — AB (ref 0.61–1.24)
GFR, EST AFRICAN AMERICAN: 27 mL/min — AB (ref >60)
GFR, EST NON AFRICAN AMERICAN: 24 mL/min — AB (ref >60)
Glucose, Bld: 78 mg/dL (ref 65–99)
Phosphorus: 2.7 mg/dL (ref 2.5–4.6)
Potassium: 4.3 mmol/L (ref 3.5–5.1)
Sodium: 140 mmol/L (ref 135–145)

## 2014-08-30 LAB — HEPATITIS B CORE ANTIBODY, TOTAL: Hep B Core Total Ab: NEGATIVE

## 2014-08-30 LAB — HEPATITIS B SURFACE ANTIGEN: Hepatitis B Surface Ag: NEGATIVE

## 2014-08-30 LAB — MAGNESIUM: Magnesium: 2.2 mg/dL (ref 1.7–2.4)

## 2014-08-30 LAB — HEPATITIS B SURFACE ANTIBODY,QUALITATIVE: Hep B S Ab: REACTIVE

## 2014-08-30 MED ORDER — CARVEDILOL 12.5 MG PO TABS
12.5000 mg | ORAL_TABLET | Freq: Two times a day (BID) | ORAL | Status: DC
  Administered 2014-08-30 – 2014-09-07 (×14): 12.5 mg via ORAL
  Filled 2014-08-30 (×14): qty 1
  Filled 2014-08-30: qty 2

## 2014-08-30 MED ORDER — HYDRALAZINE HCL 20 MG/ML IJ SOLN
10.0000 mg | INTRAMUSCULAR | Status: DC | PRN
  Administered 2014-08-31 (×3): 10 mg via INTRAVENOUS
  Filled 2014-08-30 (×4): qty 1

## 2014-08-30 MED ORDER — DARBEPOETIN ALFA 150 MCG/0.3ML IJ SOSY
150.0000 ug | PREFILLED_SYRINGE | INTRAMUSCULAR | Status: DC
  Administered 2014-09-03: 150 ug via INTRAVENOUS
  Filled 2014-08-30: qty 0.3

## 2014-08-30 MED ORDER — CARVEDILOL 12.5 MG PO TABS: 12.5000 mg | ORAL_TABLET | Freq: Two times a day (BID) | ORAL | Status: DC

## 2014-08-30 NOTE — Progress Notes (Signed)
Pt had an episode of emesis of undigested food while eating lunch. Family was in the room. Pt did not c/o nausea and stated that he felt fine. O2 sat is 99 on 2L. Pt is in no distress. Will continue to monitor.

## 2014-08-30 NOTE — Progress Notes (Signed)
PULMONARY / CRITICAL CARE MEDICINE   Name: Kyle [name] MRN: [id number] DOB: [birth year]    ADMISSION DATE:  08/20/2014 CONSULTATION DATE:  08/20/2014  REFERRING MD :  Ree Kida   CHIEF COMPLAINT:  PEA arrest    INITIAL PRESENTATION:   15 with hx of ESRD presented for tunneled HD catheter.  Developed bradycardic PEA arrest >> about 10 minutes for ROSC.  Hx of systolic CHF with baseline EF 15%.  STUDIES:  7/27 paracentesis 7/28 Echo >> EF 10% 8/04 CT Head/Neck - negative for fracture or trauma  SIGNIFICANT EVENTS: 7/27 Cardiac arrest, intubated and to ICU, hypothermia 7/29 rewarmed 7/30 more agitated 8/03 extubated 8/04 unwitnessed fall  SUBJECTIVE:  Patient has had frequent stools since yesterday. Denies any chest pain or pressure. Denies any dyspnea. No coughing. Patient did suffer unwitnessed fall yesterday.  ROS:  Denies any nausea or vomiting. No subjective fever or sweats. An accurate ROS is difficult as patient remains altered.  VITAL SIGNS: Temp:  [97.4 F (36.3 C)-97.5 F (36.4 C)] 97.5 F (36.4 C) (08/04 2245) Pulse Rate:  [98-118] 110 (08/04 2245) Resp:  [14-31] 18 (08/05 0800) BP: (138-188)/(90-140) 178/122 mmHg (08/05 0800) SpO2:  [95 %-100 %] 96 % (08/05 0400) Weight:  [189 lb 9.5 oz (86 kg)-202 lb 13.2 oz (92 kg)] 189 lb 9.5 oz (86 kg) (08/05 0500)   HEMODYNAMICS:    VENTILATOR SETTINGS:     INTAKE / OUTPUT:  Intake/Output Summary (Last 24 hours) at 08/30/14 1044 Last data filed at 08/29/14 2245  Gross per 24 hour  Intake    110 ml  Output   3520 ml  Net  -3410 ml   PHYSICAL EXAMINATION: General:  Awake. Alert. No distress lying in bed watching TV & eating. Integument:  Warm & dry. No rash on exposed skin. HEENT:  Moist mucus membranes. No oral ulcers. No scleral injection. Cardiovascular:  Regular rate. Anasarca resolved. No JVD. Pulmonary:  Improving bilateral breath sounds. Symmetric chest wall rise. Normal work of breathing on nasal  cannula oxygen. Abdomen: Soft. Normal bowel sounds. Nondistended.  Neurological:  Patient grossly nonfocal. Moving all 4 extremities equally. Patient is aware of the president but not the year or season. Non-combative.  LABS:  CBC  Recent Labs Lab 08/26/14 0420 08/28/14 0420 08/30/14 0220  WBC 7.5 8.6 11.9*  HGB 8.6* 10.7* 9.4*  HCT 26.3* 34.1* 29.1*  PLT 174 236 254   Coag's No results for input(s): APTT, INR in the last 168 hours. BMET  Recent Labs Lab 08/28/14 1700 08/29/14 0430 08/30/14 0220  NA 138 138 140  K 4.0 4.1 4.3  CL 105 104 103  CO2 27 25 23   BUN 44* 53* 32*  CREATININE 2.49* 3.03* 2.76*  GLUCOSE 85 101* 78   Electrolytes  Recent Labs Lab 08/28/14 0420 08/28/14 1700 08/29/14 0430 08/30/14 0220  CALCIUM 8.0* 8.2* 8.2* 8.2*  MG 2.4  --  2.3 2.2  PHOS 3.3 3.8 3.8 2.7   Sepsis Markers No results for input(s): LATICACIDVEN, PROCALCITON, O2SATVEN in the last 168 hours. ABG  Recent Labs Lab 08/25/14 1045  PHART 7.393  PCO2ART 38.9  PO2ART 162*   Liver Enzymes  Recent Labs Lab 08/25/14 0430  08/26/14 0420  08/28/14 1700 08/29/14 0430 08/30/14 0220  AST 17  --  20  --   --   --   --   ALT 15*  --  14*  --   --   --   --  ALKPHOS 85  --  77  --   --   --   --   BILITOT 0.5  --  0.4  --   --   --   --   ALBUMIN 1.7*  < > 1.7*  < > 2.0* 2.0* 2.3*  < > = values in this interval not displayed. Cardiac Enzymes No results for input(s): TROPONINI, PROBNP in the last 168 hours. Glucose  Recent Labs Lab 08/29/14 1200 08/29/14 1704 08/29/14 2000 08/29/14 2128 08/30/14 0037 08/30/14 0426  GLUCAP 116* 119* 100* 90 95 104*    Imaging Ct Head Wo Contrast  08/29/2014   CLINICAL DATA:  Trauma. Unwitnessed fall. Confusion and agitation today. Initial encounter.  EXAM: CT HEAD WITHOUT CONTRAST  CT CERVICAL SPINE WITHOUT CONTRAST  TECHNIQUE: Multidetector CT imaging of the head and cervical spine was performed following the standard protocol  without intravenous contrast. Multiplanar CT image reconstructions of the cervical spine were also generated.  COMPARISON:  None.  FINDINGS: CT HEAD FINDINGS  Study is mildly motion degraded. Punctate hyperdense focus near the foramen of [place]endie is consistent with calcification rather than hemorrhage. There is no evidence of acute intracranial hemorrhage. Ventricles and sulci are normal in size for age. There is no evidence of acute large territory infarct, mass, midline shift, or extra-axial fluid collection. Periventricular white-matter hypodensities are nonspecific but compatible with mild chronic small vessel ischemic disease.  Orbits are unremarkable. The visualized mastoid air cells are clear. There is mild right frontal and bilateral ethmoid sinus mucosal thickening. No skull fracture is identified.  CT CERVICAL SPINE FINDINGS  There is mild motion artifact through the mid to lower cervical spine despite repeating the study. There is straightening of the normal cervical lordosis. There is no listhesis. Moderate to severe disc space narrowing is present from C4-5 to C6-7 with associated degenerative endplate changes. No cervical spine fracture is identified. Moderate spinal stenosis is suspected at C4-5 and C5-6 due to broad-based posterior disc osteophyte complexes, and there is moderate to severe neural foraminal stenosis on the left at C4-5 and on the right at C5-6.  Anasarca is noted. A right jugular central venous catheter is partially visualized. A 2.5 cm hypoattenuating nodule is noted in the right thyroid lobe. Bilateral pleural effusions are partially visualized. Skin staples are noted in the right lower neck.  IMPRESSION: 1. Mildly motion degraded examination. 2. No evidence of acute intracranial abnormality. 3. No acute osseous abnormality identified in the cervical spine. Multilevel cervical disc degeneration. 4. Anasarca and bilateral pleural effusions, incompletely visualized. 5. 2.5 cm right  thyroid nodule. Further evaluation with outpatient thyroid ultrasound is suggested.   Electronically Signed   By: Logan Bores   On: 08/29/2014 15:32   Ct Cervical Spine Wo Contrast  08/29/2014   CLINICAL DATA:  Trauma. Unwitnessed fall. Confusion and agitation today. Initial encounter.  EXAM: CT HEAD WITHOUT CONTRAST  CT CERVICAL SPINE WITHOUT CONTRAST  TECHNIQUE: Multidetector CT imaging of the head and cervical spine was performed following the standard protocol without intravenous contrast. Multiplanar CT image reconstructions of the cervical spine were also generated.  COMPARISON:  None.  FINDINGS: CT HEAD FINDINGS  Study is mildly motion degraded. Punctate hyperdense focus near the foramen of Magendie is consistent with calcification rather than hemorrhage. There is no evidence of acute intracranial hemorrhage. Ventricles and sulci are normal in size for age. There is no evidence of acute large territory infarct, mass, midline shift, or extra-axial fluid collection. Periventricular white-matter  hypodensities are nonspecific but compatible with mild chronic small vessel ischemic disease.  Orbits are unremarkable. The visualized mastoid air cells are clear. There is mild right frontal and bilateral ethmoid sinus mucosal thickening. No skull fracture is identified.  CT CERVICAL SPINE FINDINGS  There is mild motion artifact through the mid to lower cervical spine despite repeating the study. There is straightening of the normal cervical lordosis. There is no listhesis. Moderate to severe disc space narrowing is present from C4-5 to C6-7 with associated degenerative endplate changes. No cervical spine fracture is identified. Moderate spinal stenosis is suspected at C4-5 and C5-6 due to broad-based posterior disc osteophyte complexes, and there is moderate to severe neural foraminal stenosis on the left at C4-5 and on the right at C5-6.  Anasarca is noted. A right jugular central venous catheter is partially  visualized. A 2.5 cm hypoattenuating nodule is noted in the right thyroid lobe. Bilateral pleural effusions are partially visualized. Skin staples are noted in the right lower neck.  IMPRESSION: 1. Mildly motion degraded examination. 2. No evidence of acute intracranial abnormality. 3. No acute osseous abnormality identified in the cervical spine. Multilevel cervical disc degeneration. 4. Anasarca and bilateral pleural effusions, incompletely visualized. 5. 2.5 cm right thyroid nodule. Further evaluation with outpatient thyroid ultrasound is suggested.   Electronically Signed   By: Logan Bores   On: 08/29/2014 15:32     ASSESSMENT / PLAN:  PULMONARY ETT 7/27>>>8/3 A: Acute respiratory failure after cardiac arrest.pleural effusion rt?, pulm edema, gross overlaod Failed SBT due to absence of cuff leak 8/2 - Now extubated.  P:   Continue to wean FiO2 for Sat >92% Continue diuresis with HD  CARDIOVASCULAR Rt IJ HD cath 7/27 >>  Lt IJ CVL 7/27 >> 8/4 (removed accidentally during fall) Rt femoral aline 7/27 >> 7/30 A: PEA cardiac arrest. Acute on chronic combined CHF. Hx of HTN.  P:  PRN hydralazine Cardiology following Coreg bid Continuing Diuresis with HD   RENAL A: ESRD - on HD Non gap metabolic acidosis - resolved.  P:   Nephrology following Continuing to monitor electrolytes daily HD   GASTROINTESTINAL A: Ascites - Doubt SBP given coag neg staph  P:   Renal diet  Neg balance continues  HEMATOLOGIC A: Anemia of chronic disease. Some loss blood from cvvhd lack of return to filter.  P:  Trending hemoglobin daily with CBC Heparin subcutaneous every 8 hours  SCDs  INFECTIOUS A: Possible SBP - GPC in clusters in peritoneal fluid, concern asp 7/30 Possible Aspiration Pneumonia - completing 7 day course of tx  P:   Empiric Zosyn Day 7/7 Monitor for fever Monitor daily WBC with CBC  Peritoneal fluid 7/27 >> GPC in clusters >>coag neg staph Respiratory Ctx  7/30 >> C. albicans  Zosyn 7/30>>>8/5  ENDOCRINE A: Hx of DM. Episodes of hypoglycemia - resolved.  P:   SSI  Accuchecks to qAC & HS  NEUROLOGIC A: Acute encephalopathy after cardiac arrest - Improving.  P:   Monitor closely due to fall risk Sitter at bedside Eliminating sedating medications  Brief ICU Summary Date: End-stage renal disease patient on hemodialysis. Admitted to ICU after PEA arrest during attempted dialysis access placement. Patient initially was on CVVHD and has transitioned to hemodialysis. Successful extubated over 24 hours with improving pulmonary status. Mental status continues to improve but patient did suffer an unwitnessed fall on 8/4. CT scan of the head/neck was negative for any fracture or obvious trauma. Incidentally found thyroid  nodule on CT scan of the neck will need to be followed up as an outpatient. Patient transitioning to floor with telemetry monitoring and hospitalist service.  Sonia Baller Ashok Cordia, M.D. [facility] Pager:  [phone] After 3pm or if no response, call [phone]

## 2014-08-30 NOTE — Evaluation (Signed)
Clinical/Bedside Swallow Evaluation Patient Details  Name: Kyle [name] MRN: [id number] Date of Birth: 1955/02/10  Today's Date: 08/30/2014 Time: SLP Start Time (ACUTE ONLY): [id number] SLP Stop Time (ACUTE ONLY): 0858 SLP Time Calculation (min) (ACUTE ONLY): 23 min  Past Medical History:  Past Medical History  Diagnosis Date  . Hypertension   . Diabetes mellitus without complication    Past Surgical History:  Past Surgical History  Procedure Laterality Date  . External ear surgery    . Tonsillectomy    . Insertion of dialysis catheter N/A 08/21/2014    Procedure: INSERTION OF DIALYSIS CATHETER RIGHT INTERNAL JUGULAR VEIN;  Surgeon: Conrad Lake Milton, MD;  Location: [facility];  Service: Vascular;  Laterality: N/A;  MAC to General   HPI:  Pt is a 59 y.o. male with PMH of chronic systolic and diastolic CHF (baseline EF 15% and grade 2 diastolic dysfunction from 2 D ECHO in 12/2013), hypertension, CKD stage 5, diabetes who presented to [facility] ED with worsening abdominal distention, scrotal and leg swelling over past few weeks prior to this admission. He also reported weight gain over past few months and feeling very tired and shortof breath with even minimal exertion. Initial CXR 7/26 showed small-mod bilateral pleural effusions with bibasilar atelectasis. Pt had vascular surgery 7/27 and then had bradycardia/ PEA arrest with 10 mins CPR before ROSC, no purposeful mental status post-arrest. Intubated 7/27-8/3. Reportedly neuro improvements but continued confusion. Pt had a fall on 8/4 but head CT was clear. Bedside swallow eval ordered to assess swallow function post- prolonged intubation.   Assessment / Plan / Recommendation Clinical Impression  Pt demonstrated no immediate s/s of aspiration during evaluation; however, did have a delayed cough x2 following trials of regular solid (bagel). Pt tolerated thin liquids well- swallow appeared timely with adequate hyolaryngeal excursion. Pt currently impulsive with  meal, taking large bites/ sips at a time. Given prolonged intubation and current cognitive status, pt is at an increased risk of aspiration, risk decreased with supervision and altered diet. Recommend downgrading diet to dysphagia 3/ thin liquids, meds whole with puree (crushed if difficulties arise), full supervision to cue for small bites/ sips and to assist with feeding if needed, also monitor alertness during meal. Will continue to follow for diet tolerance/ advancement.    Aspiration Risk  Moderate    Diet Recommendation Dysphagia 3 (Mech soft);Thin   Medication Administration: Whole meds with puree Compensations: Slow rate;Small sips/bites    Other  Recommendations Oral Care Recommendations: Oral care BID   Follow Up Recommendations       Frequency and Duration min 2x/week  1 week   Pertinent Vitals/Pain none    SLP Swallow Goals     Swallow Study Prior Functional Status       General Other Pertinent Information: Pt is a 59 y.o. male with PMH of chronic systolic and diastolic CHF (baseline EF 15% and grade 2 diastolic dysfunction from 2 D ECHO in 12/2013), hypertension, CKD stage 5, diabetes who presented to [facility] ED with worsening abdominal distention, scrotal and leg swelling over past few weeks prior to this admission. He also reported weight gain over past few months and feeling very tired and shortof breath with even minimal exertion. Initial CXR 7/26 showed small-mod bilateral pleural effusions with bibasilar atelectasis. Pt had vascular surgery 7/27 and then had bradycardia/ PEA arrest with 10 mins CPR before ROSC, no purposeful mental status post-arrest. Intubated 7/27-8/3. Reportedly neuro improvements but continued confusion. Pt had a fall  on 8/4 but head CT was clear. Bedside swallow eval ordered to assess swallow function post- prolonged intubation. Type of Study: Bedside swallow evaluation Diet Prior to this Study: Regular;Thin liquids Temperature Spikes Noted:  No Respiratory Status: Supplemental O2 delivered via (comment) History of Recent Intubation: Yes Length of Intubations (days): 7 days Date extubated: 08/28/14 Behavior/Cognition: Alert;Impulsive;Requires cueing Oral Cavity - Dentition: Adequate natural dentition/normal for age Self-Feeding Abilities: Able to feed self Patient Positioning: Upright in bed Baseline Vocal Quality: Normal Volitional Cough: Weak Volitional Swallow: Able to elicit    Oral/Motor/Sensory Function Overall Oral Motor/Sensory Function: Appears within functional limits for tasks assessed Labial ROM: Within Functional Limits Labial Symmetry: Within Functional Limits Labial Strength: Within Functional Limits Lingual ROM: Within Functional Limits Lingual Symmetry: Within Functional Limits Lingual Strength: Within Functional Limits   Ice Chips Ice chips: Not tested   Thin Liquid Thin Liquid: Within functional limits Presentation: Cup;Straw    Nectar Thick Nectar Thick Liquid: Not tested   Honey Thick Honey Thick Liquid: Not tested   Puree Puree: Within functional limits Presentation: Self Fed;Spoon   Solid   GO    Solid: Impaired Presentation: Self Fed Oral Phase Impairments: Other (comment) (prolonged mastication/ decreased attention to bolus) Oral Phase Functional Implications: Oral residue Pharyngeal Phase Impairments: Cough - Delayed       Kern Reap, MA, CCC-SLP 08/30/2014,9:07 AM [phone]

## 2014-08-30 NOTE — Progress Notes (Signed)
Subjective:  Some confusion "fell " out of bed- says he was trying to get out of bed- pulled some lines- has a sitter with him now- can exchange pleasantries but not sure he completely understands situation Objective Vital signs in last 24 hours: Filed Vitals:   08/30/14 0500 08/30/14 0639 08/30/14 0700 08/30/14 0800  BP: 177/140 164/108 179/122 178/122  Pulse:      Temp:      TempSrc:      Resp: 28 19  18   Height:      Weight: 86 kg (189 lb 9.5 oz)     SpO2:       Weight change: 4.2 kg (9 lb 4.2 oz)  Intake/Output Summary (Last 24 hours) at 08/30/14 0911 Last data filed at 08/29/14 2245  Gross per 24 hour  Intake    160 ml  Output   3520 ml  Net  -3360 ml    Assessment/ Plan: Pt is a 59 y.o. yo male with known stage 5 CKD who was admitted on 08/20/2014 with SOB and worsening renal function.  Then had cardiac arrest in the setting of having Permcath placed on 7/27  Assessment/Plan: 1. Renal- progressed to ESRD recently-  was placed on CRRT due to events - on from 7/27-8/3- responded well-able to remove much fluid but still has a little ways to go.  Had IHD 8/4-  plan again for tomorrow.  Will need OP HD arrangements and perm access eventually prior to discharge- maybe early next week ?  Will order vein mapping and get VVS involved   2. Anemia- related to events and CKD- iron low- repleting and have added aranesp.   3. HTN/volume- massive volume overload- needed much volume removal- made dent with several days of CRRT- now on IHD- also coreg and PRN hydralazine- hard to know how much is due to agitation  4. Elytes-  K and phos stable-  5. VDRF- now extubated 6. S/p arrest- neuro improvements- but not to baseline 7. ID- ?peritonitis- abx per CCM  Miley Lindon A    Labs: Basic Metabolic Panel:  Recent Labs Lab 08/28/14 1700 08/29/14 0430 08/30/14 0220  NA 138 138 140  K 4.0 4.1 4.3  CL 105 104 103  CO2 27 25 23   GLUCOSE 85 101* 78  BUN 44* 53* 32*  CREATININE  2.49* 3.03* 2.76*  CALCIUM 8.2* 8.2* 8.2*  PHOS 3.8 3.8 2.7   Liver Function Tests:  Recent Labs Lab 08/25/14 0430  08/26/14 0420  08/28/14 1700 08/29/14 0430 08/30/14 0220  AST 17  --  20  --   --   --   --   ALT 15*  --  14*  --   --   --   --   ALKPHOS 85  --  77  --   --   --   --   BILITOT 0.5  --  0.4  --   --   --   --   PROT 5.0*  --  5.1*  --   --   --   --   ALBUMIN 1.7*  < > 1.7*  < > 2.0* 2.0* 2.3*  < > = values in this interval not displayed. No results for input(s): LIPASE, AMYLASE in the last 168 hours. No results for input(s): AMMONIA in the last 168 hours. CBC:  Recent Labs Lab 08/24/14 0400 08/25/14 0430 08/26/14 0420 08/28/14 0420 08/30/14 0220  WBC 11.1* 8.7 7.5 8.6 11.9*  NEUTROABS  --  7.2 6.0  --  9.7*  HGB 10.0* 8.8* 8.6* 10.7* 9.4*  HCT 30.5* 27.2* 26.3* 34.1* 29.1*  MCV 81.3 83.4 83.5 83.8 82.9  PLT 292 193 174 236 254   Cardiac Enzymes: No results for input(s): CKTOTAL, CKMB, CKMBINDEX, TROPONINI in the last 168 hours. CBG:  Recent Labs Lab 08/29/14 1704 08/29/14 2000 08/29/14 2128 08/30/14 0037 08/30/14 0426  GLUCAP 119* 100* 90 95 104*    Iron Studies:  No results for input(s): IRON, TIBC, TRANSFERRIN, FERRITIN in the last 72 hours. Studies/Results: Ct Head Wo Contrast  08/29/2014   CLINICAL DATA:  Trauma. Unwitnessed fall. Confusion and agitation today. Initial encounter.  EXAM: CT HEAD WITHOUT CONTRAST  CT CERVICAL SPINE WITHOUT CONTRAST  TECHNIQUE: Multidetector CT imaging of the head and cervical spine was performed following the standard protocol without intravenous contrast. Multiplanar CT image reconstructions of the cervical spine were also generated.  COMPARISON:  None.  FINDINGS: CT HEAD FINDINGS  Study is mildly motion degraded. Punctate hyperdense focus near the foramen of Magendie is consistent with calcification rather than hemorrhage. There is no evidence of acute intracranial hemorrhage. Ventricles and sulci are  normal in size for age. There is no evidence of acute large territory infarct, mass, midline shift, or extra-axial fluid collection. Periventricular white-matter hypodensities are nonspecific but compatible with mild chronic small vessel ischemic disease.  Orbits are unremarkable. The visualized mastoid air cells are clear. There is mild right frontal and bilateral ethmoid sinus mucosal thickening. No skull fracture is identified.  CT CERVICAL SPINE FINDINGS  There is mild motion artifact through the mid to lower cervical spine despite repeating the study. There is straightening of the normal cervical lordosis. There is no listhesis. Moderate to severe disc space narrowing is present from C4-5 to C6-7 with associated degenerative endplate changes. No cervical spine fracture is identified. Moderate spinal stenosis is suspected at C4-5 and C5-6 due to broad-based posterior disc osteophyte complexes, and there is moderate to severe neural foraminal stenosis on the left at C4-5 and on the right at C5-6.  Anasarca is noted. A right jugular central venous catheter is partially visualized. A 2.5 cm hypoattenuating nodule is noted in the right thyroid lobe. Bilateral pleural effusions are partially visualized. Skin staples are noted in the right lower neck.  IMPRESSION: 1. Mildly motion degraded examination. 2. No evidence of acute intracranial abnormality. 3. No acute osseous abnormality identified in the cervical spine. Multilevel cervical disc degeneration. 4. Anasarca and bilateral pleural effusions, incompletely visualized. 5. 2.5 cm right thyroid nodule. Further evaluation with outpatient thyroid ultrasound is suggested.   Electronically Signed   By: Logan Bores   On: 08/29/2014 15:32   Ct Cervical Spine Wo Contrast  08/29/2014   CLINICAL DATA:  Trauma. Unwitnessed fall. Confusion and agitation today. Initial encounter.  EXAM: CT HEAD WITHOUT CONTRAST  CT CERVICAL SPINE WITHOUT CONTRAST  TECHNIQUE: Multidetector CT  imaging of the head and cervical spine was performed following the standard protocol without intravenous contrast. Multiplanar CT image reconstructions of the cervical spine were also generated.  COMPARISON:  None.  FINDINGS: CT HEAD FINDINGS  Study is mildly motion degraded. Punctate hyperdense focus near the foramen of Magendie is consistent with calcification rather than hemorrhage. There is no evidence of acute intracranial hemorrhage. Ventricles and sulci are normal in size for age. There is no evidence of acute large territory infarct, mass, midline shift, or extra-axial fluid collection. Periventricular white-matter hypodensities are nonspecific but compatible with mild chronic small  vessel ischemic disease.  Orbits are unremarkable. The visualized mastoid air cells are clear. There is mild right frontal and bilateral ethmoid sinus mucosal thickening. No skull fracture is identified.  CT CERVICAL SPINE FINDINGS  There is mild motion artifact through the mid to lower cervical spine despite repeating the study. There is straightening of the normal cervical lordosis. There is no listhesis. Moderate to severe disc space narrowing is present from C4-5 to C6-7 with associated degenerative endplate changes. No cervical spine fracture is identified. Moderate spinal stenosis is suspected at C4-5 and C5-6 due to broad-based posterior disc osteophyte complexes, and there is moderate to severe neural foraminal stenosis on the left at C4-5 and on the right at C5-6.  Anasarca is noted. A right jugular central venous catheter is partially visualized. A 2.5 cm hypoattenuating nodule is noted in the right thyroid lobe. Bilateral pleural effusions are partially visualized. Skin staples are noted in the right lower neck.  IMPRESSION: 1. Mildly motion degraded examination. 2. No evidence of acute intracranial abnormality. 3. No acute osseous abnormality identified in the cervical spine. Multilevel cervical disc degeneration. 4.  Anasarca and bilateral pleural effusions, incompletely visualized. 5. 2.5 cm right thyroid nodule. Further evaluation with outpatient thyroid ultrasound is suggested.   Electronically Signed   By: Logan Bores   On: 08/29/2014 15:32   Medications: Infusions: . sodium chloride Stopped (08/24/14 0300)  . sodium chloride      Scheduled Medications: . antiseptic oral rinse  7 mL Mouth Rinse BID  . carvedilol  12.5 mg Oral BID WC  . darbepoetin (ARANESP) injection - NON-DIALYSIS  150 mcg Subcutaneous Q Tue-1800  . feeding supplement (NEPRO CARB STEADY)  237 mL Oral BID BM  . heparin  5,000 Units Subcutaneous 3 times per day  . insulin aspart  0-20 Units Subcutaneous TID AC & HS  . piperacillin-tazobactam (ZOSYN)  IV  2.25 g Intravenous 3 times per day  . sodium chloride  10-40 mL Intracatheter Q12H    have reviewed scheduled and prn medications.  Physical Exam: General: extubated, alert but a little confused  Heart: RRR Lungs: dec BS at bases Abdomen: distended  Extremities:  pitting edema Dialysis Access: right sided PC    08/30/2014,9:11 AM  LOS: 10 days

## 2014-08-30 NOTE — Progress Notes (Addendum)
Subjective:  Extubated. No CP. No SOB. Wants to get out of bed. Mildly confused.   Objective:  Vital Signs in the last 24 hours: Temp:  [97.4 F (36.3 C)-97.5 F (36.4 C)] 97.5 F (36.4 C) (08/04 2245) Pulse Rate:  [98-118] 110 (08/04 2245) Resp:  [14-31] 18 (08/05 0800) BP: (138-188)/(90-140) 178/122 mmHg (08/05 0800) SpO2:  [95 %-100 %] 96 % (08/05 0400) Weight:  [189 lb 9.5 oz (86 kg)-202 lb 13.2 oz (92 kg)] 189 lb 9.5 oz (86 kg) (08/05 0500)  Intake/Output from previous day: 08/04 0701 - 08/05 0700 In: 160 [IV Piggyback:160] Out: 3520 [Urine:20]   Physical Exam: General: Well developed, well nourished, in no distress. Head:  Normocephalic and atraumatic.  Lungs: Clear to auscultation and percussion. Heart: Normal S1 and S2.  No murmur, rubs or gallops.  Abdomen: soft, non-tender, positive bowel sounds. Obese/anasarca Extremities: No clubbing or cyanosis. Trace LE edema. Neurologic: follows commands, improved R subclav cath    Lab Results:  Recent Labs  08/28/14 0420 08/30/14 0220  WBC 8.6 11.9*  HGB 10.7* 9.4*  PLT 236 254    Recent Labs  08/29/14 0430 08/30/14 0220  NA 138 140  K 4.1 4.3  CL 104 103  CO2 25 23  GLUCOSE 101* 78  BUN 53* 32*  CREATININE 3.03* 2.76*   No results for input(s): TROPONINI in the last 72 hours.  Invalid input(s): CK, MB Hepatic Function Panel  Recent Labs  08/30/14 0220  ALBUMIN 2.3*   No results for input(s): CHOL in the last 72 hours. No results for input(s): PROTIME in the last 72 hours.  Imaging: Ct Head Wo Contrast  08/29/2014   CLINICAL DATA:  Trauma. Unwitnessed fall. Confusion and agitation today. Initial encounter.  EXAM: CT HEAD WITHOUT CONTRAST  CT CERVICAL SPINE WITHOUT CONTRAST  TECHNIQUE: Multidetector CT imaging of the head and cervical spine was performed following the standard protocol without intravenous contrast. Multiplanar CT image reconstructions of the cervical spine were also generated.   COMPARISON:  None.  FINDINGS: CT HEAD FINDINGS  Study is mildly motion degraded. Punctate hyperdense focus near the foramen of Magendie is consistent with calcification rather than hemorrhage. There is no evidence of acute intracranial hemorrhage. Ventricles and sulci are normal in size for age. There is no evidence of acute large territory infarct, mass, midline shift, or extra-axial fluid collection. Periventricular white-matter hypodensities are nonspecific but compatible with mild chronic small vessel ischemic disease.  Orbits are unremarkable. The visualized mastoid air cells are clear. There is mild right frontal and bilateral ethmoid sinus mucosal thickening. No skull fracture is identified.  CT CERVICAL SPINE FINDINGS  There is mild motion artifact through the mid to lower cervical spine despite repeating the study. There is straightening of the normal cervical lordosis. There is no listhesis. Moderate to severe disc space narrowing is present from C4-5 to C6-7 with associated degenerative endplate changes. No cervical spine fracture is identified. Moderate spinal stenosis is suspected at C4-5 and C5-6 due to broad-based posterior disc osteophyte complexes, and there is moderate to severe neural foraminal stenosis on the left at C4-5 and on the right at C5-6.  Anasarca is noted. A right jugular central venous catheter is partially visualized. A 2.5 cm hypoattenuating nodule is noted in the right thyroid lobe. Bilateral pleural effusions are partially visualized. Skin staples are noted in the right lower neck.  IMPRESSION: 1. Mildly motion degraded examination. 2. No evidence of acute intracranial abnormality. 3. No  acute osseous abnormality identified in the cervical spine. Multilevel cervical disc degeneration. 4. Anasarca and bilateral pleural effusions, incompletely visualized. 5. 2.5 cm right thyroid nodule. Further evaluation with outpatient thyroid ultrasound is suggested.   Electronically Signed   By:  Logan Bores   On: 08/29/2014 15:32   Ct Cervical Spine Wo Contrast  08/29/2014   CLINICAL DATA:  Trauma. Unwitnessed fall. Confusion and agitation today. Initial encounter.  EXAM: CT HEAD WITHOUT CONTRAST  CT CERVICAL SPINE WITHOUT CONTRAST  TECHNIQUE: Multidetector CT imaging of the head and cervical spine was performed following the standard protocol without intravenous contrast. Multiplanar CT image reconstructions of the cervical spine were also generated.  COMPARISON:  None.  FINDINGS: CT HEAD FINDINGS  Study is mildly motion degraded. Punctate hyperdense focus near the foramen of Magendie is consistent with calcification rather than hemorrhage. There is no evidence of acute intracranial hemorrhage. Ventricles and sulci are normal in size for age. There is no evidence of acute large territory infarct, mass, midline shift, or extra-axial fluid collection. Periventricular white-matter hypodensities are nonspecific but compatible with mild chronic small vessel ischemic disease.  Orbits are unremarkable. The visualized mastoid air cells are clear. There is mild right frontal and bilateral ethmoid sinus mucosal thickening. No skull fracture is identified.  CT CERVICAL SPINE FINDINGS  There is mild motion artifact through the mid to lower cervical spine despite repeating the study. There is straightening of the normal cervical lordosis. There is no listhesis. Moderate to severe disc space narrowing is present from C4-5 to C6-7 with associated degenerative endplate changes. No cervical spine fracture is identified. Moderate spinal stenosis is suspected at C4-5 and C5-6 due to broad-based posterior disc osteophyte complexes, and there is moderate to severe neural foraminal stenosis on the left at C4-5 and on the right at C5-6.  Anasarca is noted. A right jugular central venous catheter is partially visualized. A 2.5 cm hypoattenuating nodule is noted in the right thyroid lobe. Bilateral pleural effusions are  partially visualized. Skin staples are noted in the right lower neck.  IMPRESSION: 1. Mildly motion degraded examination. 2. No evidence of acute intracranial abnormality. 3. No acute osseous abnormality identified in the cervical spine. Multilevel cervical disc degeneration. 4. Anasarca and bilateral pleural effusions, incompletely visualized. 5. 2.5 cm right thyroid nodule. Further evaluation with outpatient thyroid ultrasound is suggested.   Electronically Signed   By: Logan Bores   On: 08/29/2014 15:32   Personally viewed.   Telemetry: No adverse arrhythmias, rare PAT Personally viewed.   EKG:  NSR  Cardiac Studies:  EF 10%, LVH  Scheduled Meds: . antiseptic oral rinse  7 mL Mouth Rinse BID  . carvedilol  12.5 mg Oral BID WC  . darbepoetin (ARANESP) injection - NON-DIALYSIS  150 mcg Subcutaneous Q Tue-1800  . feeding supplement (NEPRO CARB STEADY)  237 mL Oral BID BM  . heparin  5,000 Units Subcutaneous 3 times per day  . insulin aspart  0-20 Units Subcutaneous TID AC & HS  . piperacillin-tazobactam (ZOSYN)  IV  2.25 g Intravenous 3 times per day  . sodium chloride  10-40 mL Intracatheter Q12H   Continuous Infusions: . sodium chloride Stopped (08/24/14 0300)  . sodium chloride     PRN Meds:.sodium chloride, sodium chloride, bisacodyl, feeding supplement (NEPRO CARB STEADY), fentaNYL (SUBLIMAZE) injection, heparin, heparin, heparin, hydrALAZINE, lidocaine (PF), lidocaine-prilocaine, [DISCONTINUED] ondansetron **OR** ondansetron (ZOFRAN) IV, pentafluoroprop-tetrafluoroeth, sodium chloride   Assessment/Plan:  Principal Problem:   Anasarca Active Problems:  Acute on chronic combined systolic and diastolic CHF, NYHA class 2   CKD (chronic kidney disease) stage 5, GFR less than 15 ml/min   Benign essential HTN   DM (diabetes mellitus), type 2, uncontrolled, with renal complications   Troponin level elevated   Anemia of chronic disease   Cardiorenal syndrome with renal failure    Ascites   Acute respiratory failure, unspecified whether with hypoxia or hypercapnia   Cardiac arrest   Pressure ulcer   Acute respiratory failure with hypoxia  59 year old with ESRD, s/p PEA arrest on 7/27 following dialysis cath placement post arctic sun protocol, EF 10%.  1. Cardiac arrest - PEA, bradycardic. Post arctic sun. Follows commands. Improving neurologically.  2. Cardiomyopathy - acute on chronic systolic heart failure  - EF 10%  - metoprolol, Bb - will increase Coreg 12 BID (was on Coreg previously). Goal 25 BID.   - no ACE-I secondary to renal function  - will have EP see him as outpatient in more elective setting once his neurologic status is confirmed and he is able to tolerate HD.  - Narrow QRS (no BiV ICD)  - Dialysis for volume control  - May need cath in future to exclude CAD. 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    3. Acute respiratory failure  - resolved, extubated  4. Possible SBP as well as aspiration  - ABX per CCM  - Zosyn stopped today  5. Encephalopathy   - seems to be improving. Conversant today. Did have episode of confusion prior with BM on the floor, fall.    6. Essential hypertension  -  I suspect BP will continue to improve with further volume control But in the meantime, increase coreg. Eating breakfast, should be able to swallow pills hopefully.    Will follow.    SKAINS, [place] 08/30/2014, 8:59 AM

## 2014-08-30 NOTE — Evaluation (Addendum)
Physical Therapy Evaluation Patient Details Name: Kyle [name] MRN: [id number] DOB: 07-19-1955 Today's Date: 08/30/2014   History of Present Illness  Pt is a 59 y.o. male with PMH of chronic systolic and diastolic CHF, EF [id number], hypertension, CKD stage 5, diabetes who presented to [facility] ED with worsening abdominal distention, scrotal and leg swelling over past few weeks prior to this admission. He also reported weight gain over past few months and feeling very tired and shortof breath with even minimal exertion. Patient with anasarca.  Initial CXR 7/26 showed small-mod bilateral pleural effusions with bibasilar atelectasis. Pt had vascular surgery 7/27 and then had bradycardia/ PEA arrest with 10 mins CPR before ROSC, no purposeful mental status post-arrest. Intubated 7/27-8/3. Reportedly neuro improvements but continued confusion. Pt had a fall on 8/4 but head CT was clear.  Clinical Impression  Patient presents with problems listed below.  Will benefit from acute PT to maximize functional mobility prior to discharge.  Patient was independent pta.  Requires +2 assist for mobility - prolonged hospital stay.  Recommend Inpatient Rehab consult to return patient to highest functional level.    Follow Up Recommendations CIR;Supervision/Assistance - 24 hour    Equipment Recommendations  Rolling walker with 5" wheels;3in1 (PT)    Recommendations for Other Services Rehab consult;OT consult     Precautions / Restrictions Precautions Precautions: Fall Restrictions Weight Bearing Restrictions: No      Mobility  Bed Mobility Overal bed mobility: Needs Assistance;+2 for physical assistance Bed Mobility: Rolling;Sidelying to Sit;Sit to Sidelying Rolling: Mod assist Sidelying to sit: Max assist;+2 for physical assistance     Sit to sidelying: Max assist;+2 for physical assistance General bed mobility comments: Verbal cues for technique.  Assist to roll to left side with use of rail.  Required +2  assist to move LE's off of bed and raise trunk to upright sitting position.  Once upright, patient used UE's for support to maintain balance, along with min guard assist for safety.  Patient able to sit EOB x 6 minutes.  BP supine 150/100, and in sitting 150/96.  HR remained in 80's during session.  Returned to sidelying with assist to lower trunk and to bring LE's onto bed.  Patient fatigued quickly.  Transfers                    Ambulation/Gait                Stairs            Wheelchair Mobility    Modified Rankin (Stroke Patients Only)       Balance Overall balance assessment: Needs assistance Sitting-balance support: Single extremity supported;Feet supported Sitting balance-Leahy Scale: Poor Sitting balance - Comments: Flexed posture and posterior lean.  Use of UE support to maintain balance. 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     Hand Dominance   Dominant Hand: Right    Extremity/Trunk Assessment   Upper Extremity Assessment: Generalized weakness (Noted proximal weakness > distal)           Lower Extremity Assessment: Generalized weakness (Strength grossly 3-/5 to 3/5)      Cervical / Trunk Assessment: Other exceptions  Communication   Communication: Expressive difficulties (Low volume speech, difficult to understand at times)  Cognition  Arousal/Alertness: Awake/alert Behavior During Therapy: Flat affect;Impulsive Overall Cognitive Status: Impaired/Different from baseline Area of Impairment: Orientation;Safety/judgement;Awareness;Following commands Orientation Level: Disoriented to;Situation;Time     Following Commands: Follows one step commands inconsistently Safety/Judgement: Decreased awareness of deficits;Decreased awareness of safety          General Comments      Exercises        Assessment/Plan    PT Assessment Patient needs continued PT services  PT Diagnosis Difficulty walking;Generalized weakness;Altered mental status   PT Problem List Decreased strength;Decreased activity tolerance;Decreased balance;Decreased mobility;Decreased coordination;Decreased cognition;Decreased knowledge of use of DME;Decreased safety awareness;Cardiopulmonary status limiting activity  PT Treatment Interventions DME instruction;Gait training;Functional mobility training;Therapeutic activities;Therapeutic exercise;Balance training;Cognitive remediation;Patient/family education   PT Goals (Current goals can be found in the Care Plan section) Acute Rehab PT Goals Patient Stated Goal: None stated PT Goal Formulation: With patient Time For Goal Achievement: 09/13/14 Potential to Achieve Goals: Good    Frequency Min 3X/week   Barriers to discharge        Co-evaluation               End of Session Equipment Utilized During Treatment: Oxygen Activity Tolerance: Patient limited by fatigue Patient left: in bed;with call bell/phone within reach;with nursing/sitter in room Nurse Communication: Mobility status         Time: [id number] PT Time Calculation (min) (ACUTE ONLY): 20 min   Charges:   PT Evaluation $Initial PT Evaluation Tier I: 1 Procedure     PT G CodesDespina Pole 2014/09/28, 7:53 PM Carita Pian. Sanjuana Kava, [place] Pager [phone]

## 2014-08-31 LAB — GLUCOSE, CAPILLARY
GLUCOSE-CAPILLARY: 140 mg/dL — AB (ref 65–99)
GLUCOSE-CAPILLARY: 114 mg/dL — AB (ref 65–99)
Glucose-Capillary: 93 mg/dL (ref 65–99)

## 2014-08-31 LAB — CBC WITH DIFFERENTIAL/PLATELET
Basophils Absolute: 0 K/uL (ref 0.0–0.1)
Basophils Relative: 0 % (ref 0–1)
Eosinophils Absolute: 0.1 K/uL (ref 0.0–0.7)
Eosinophils Relative: 1 % (ref 0–5)
HCT: 27.8 % — ABNORMAL LOW (ref 39.0–52.0)
Hemoglobin: 8.9 g/dL — ABNORMAL LOW (ref 13.0–17.0)
Lymphocytes Relative: 9 % — ABNORMAL LOW (ref 12–46)
Lymphs Abs: 0.9 K/uL (ref 0.7–4.0)
MCH: 26.6 pg (ref 26.0–34.0)
MCHC: 32 g/dL (ref 30.0–36.0)
MCV: 83.2 fL (ref 78.0–100.0)
Monocytes Absolute: 0.8 K/uL (ref 0.1–1.0)
Monocytes Relative: 9 % (ref 3–12)
Neutro Abs: 7.5 K/uL (ref 1.7–7.7)
Neutrophils Relative %: 81 % — ABNORMAL HIGH (ref 43–77)
Platelets: 264 K/uL (ref 150–400)
RBC: 3.34 MIL/uL — ABNORMAL LOW (ref 4.22–5.81)
RDW: 19.4 % — ABNORMAL HIGH (ref 11.5–15.5)
WBC: 9.2 K/uL (ref 4.0–10.5)

## 2014-08-31 LAB — RENAL FUNCTION PANEL
Albumin: 2.2 g/dL — ABNORMAL LOW (ref 3.5–5.0)
Anion gap: 14 (ref 5–15)
BUN: 48 mg/dL — ABNORMAL HIGH (ref 6–20)
CO2: 22 mmol/L (ref 22–32)
Calcium: 8.4 mg/dL — ABNORMAL LOW (ref 8.9–10.3)
Chloride: 103 mmol/L (ref 101–111)
Creatinine, Ser: 4.12 mg/dL — ABNORMAL HIGH (ref 0.61–1.24)
GFR calc Af Amer: 17 mL/min — ABNORMAL LOW
GFR calc non Af Amer: 15 mL/min — ABNORMAL LOW
Glucose, Bld: 88 mg/dL (ref 65–99)
Phosphorus: 3.9 mg/dL (ref 2.5–4.6)
Potassium: 3.8 mmol/L (ref 3.5–5.1)
Sodium: 139 mmol/L (ref 135–145)

## 2014-08-31 LAB — MAGNESIUM: Magnesium: 2.3 mg/dL (ref 1.7–2.4)

## 2014-08-31 MED ORDER — HEPARIN SODIUM (PORCINE) 1000 UNIT/ML DIALYSIS
20.0000 [IU]/kg | INTRAMUSCULAR | Status: DC | PRN
  Filled 2014-08-31: qty 2

## 2014-08-31 NOTE — Progress Notes (Signed)
Subjective:  Patient is sleepy and mildly confused but will wake up.  No chest pain or shortness of breath.  Objective:  Vital Signs in the last 24 hours: BP 166/112 mmHg  Pulse 94  Temp(Src) 97.8 F (36.6 C) (Oral)  Resp 20  Ht 6' (1.829 m)  Wt 87 kg (191 lb 12.8 oz)  BMI 26.01 kg/m2  SpO2 100%  Physical Exam:  Lungs:  Clear  Cardiac:  Regular rhythm, normal S1 and S2, no S3 Abdomen:  Soft, nontender, no masses Extremities:  No edema present  Intake/Output from previous day: 08/05 0701 - 08/06 0700 In: 480 [P.O.:360; I.V.:70; IV Piggyback:50] Out: -  Weight Filed Weights   08/30/14 1500 08/31/14 0500 08/31/14 0559  Weight: 86 kg (189 lb 9.5 oz) 88 kg (194 lb 0.1 oz) 87 kg (191 lb 12.8 oz)    Lab Results: Basic Metabolic Panel:  Recent Labs  08/30/14 0220 08/31/14 0724  NA 140 139  K 4.3 3.8  CL 103 103  CO2 23 22  GLUCOSE 78 88  BUN 32* 48*  CREATININE 2.76* 4.12*    CBC:  Recent Labs  08/30/14 0220 08/31/14 0724  WBC 11.9* 9.2  NEUTROABS 9.7* 7.5  HGB 9.4* 8.9*  HCT 29.1* 27.8*  MCV 82.9 83.2  PLT 254 264    BNP    Component Value Date/Time   BNP >4500.0* 08/20/2014 1350    PROTIME: Lab Results  Component Value Date   INR 1.29 08/22/2014   INR 1.30 08/21/2014   INR 1.28 08/20/2014    Telemetry: Currently normal sinus rhythm  Assessment/Plan:  1.  Cardiomyopathy type undetermined probable hypertensive but yet 2.  Acute on chronic systolic heart failure compensated 3.  Prior data cardiac cardiac arrest with Arctic sign improving-supposed to have EP consult as an outpatient 4.  End-stage renal disease on hemodialysis  Recommendations:  Still somewhat confused and sleepy.  Coreg being titrated.No ACE due to renal function.     Kerry Hough  MD [facility]  08/31/2014, 11:02 AM

## 2014-08-31 NOTE — Progress Notes (Signed)
Subjective:  Moved to 3W- seems much better mentally   Objective Vital signs in last 24 hours: Filed Vitals:   08/30/14 2048 08/31/14 0500 08/31/14 0559 08/31/14 0912  BP:   167/113 166/112  Pulse:    94  Temp: 98.6 F (37 C)  97.8 F (36.6 C)   TempSrc: Oral     Resp:      Height:      Weight:  88 kg (194 lb 0.1 oz) 87 kg (191 lb 12.8 oz)   SpO2:       Weight change: -6 kg (-13 lb 3.6 oz)  Intake/Output Summary (Last 24 hours) at 08/31/14 0943 Last data filed at 08/31/14 0914  Gross per 24 hour  Intake    250 ml  Output      0 ml  Net    250 ml    Assessment/ Plan: Pt is a 59 y.o. yo male with known stage 5 CKD who was admitted on 08/20/2014 with SOB and worsening renal function.  Then had cardiac arrest in the setting of having Permcath placed on 7/27  Assessment/Plan: 1. Renal- progressed to ESRD recently-  was placed on CRRT due to events - on from 7/27-8/3- responded well-able to remove much fluid but still has a little ways to go.  Had IHD 8/4-  plan again for today.  Will need OP HD arrangements and perm access eventually prior to discharge- maybe early next week ?   VVS involved   2. Anemia- related to events and CKD- iron low- repleting and have added aranesp.   3. HTN/volume- massive volume overload- needed much volume removal- made dent with several days of CRRT- now on IHD- also coreg and PRN hydralazine-  4. Elytes-  K and phos stable-  5. VDRF- now extubated 6. S/p arrest- neuro improvements- but not to baseline 7. ID- ?peritonitis-  per CCM- completed course of abx  [place]    Labs: Basic Metabolic Panel:  Recent Labs Lab 08/29/14 0430 08/30/14 0220 08/31/14 0724  NA 138 140 139  K 4.1 4.3 3.8  CL 104 103 103  CO2 25 23 22   GLUCOSE 101* 78 88  BUN 53* 32* 48*  CREATININE 3.03* 2.76* 4.12*  CALCIUM 8.2* 8.2* 8.4*  PHOS 3.8 2.7 3.9   Liver Function Tests:  Recent Labs Lab 08/25/14 0430  08/26/14 0420  08/29/14 0430  08/30/14 0220 08/31/14 0724  AST 17  --  20  --   --   --   --   ALT 15*  --  14*  --   --   --   --   ALKPHOS 85  --  77  --   --   --   --   BILITOT 0.5  --  0.4  --   --   --   --   PROT 5.0*  --  5.1*  --   --   --   --   ALBUMIN 1.7*  < > 1.7*  < > 2.0* 2.3* 2.2*  < > = values in this interval not displayed. No results for input(s): LIPASE, AMYLASE in the last 168 hours. No results for input(s): AMMONIA in the last 168 hours. CBC:  Recent Labs Lab 08/25/14 0430 08/26/14 0420 08/28/14 0420 08/30/14 0220 08/31/14 0724  WBC 8.7 7.5 8.6 11.9* 9.2  NEUTROABS 7.2 6.0  --  9.7* 7.5  HGB 8.8* 8.6* 10.7* 9.4* 8.9*  HCT 27.2* 26.3* 34.1* 29.1* 27.8*  MCV 83.4 83.5 83.8 82.9 83.2  PLT 193 174 236 254 264   Cardiac Enzymes: No results for input(s): CKTOTAL, CKMB, CKMBINDEX, TROPONINI in the last 168 hours. CBG:  Recent Labs Lab 08/30/14 0759 08/30/14 1147 08/30/14 1632 08/30/14 2055 08/31/14 0746  GLUCAP 86 123* 107* 96 93    Iron Studies:  No results for input(s): IRON, TIBC, TRANSFERRIN, FERRITIN in the last 72 hours. Studies/Results: Ct Head Wo Contrast  08/29/2014   CLINICAL DATA:  Trauma. Unwitnessed fall. Confusion and agitation today. Initial encounter.  EXAM: CT HEAD WITHOUT CONTRAST  CT CERVICAL SPINE WITHOUT CONTRAST  TECHNIQUE: Multidetector CT imaging of the head and cervical spine was performed following the standard protocol without intravenous contrast. Multiplanar CT image reconstructions of the cervical spine were also generated.  COMPARISON:  None.  FINDINGS: CT HEAD FINDINGS  Study is mildly motion degraded. Punctate hyperdense focus near the foramen of Magendie is consistent with calcification rather than hemorrhage. There is no evidence of acute intracranial hemorrhage. Ventricles and sulci are normal in size for age. There is no evidence of acute large territory infarct, mass, midline shift, or extra-axial fluid collection. Periventricular white-matter  hypodensities are nonspecific but compatible with mild chronic small vessel ischemic disease.  Orbits are unremarkable. The visualized mastoid air cells are clear. There is mild right frontal and bilateral ethmoid sinus mucosal thickening. No skull fracture is identified.  CT CERVICAL SPINE FINDINGS  There is mild motion artifact through the mid to lower cervical spine despite repeating the study. There is straightening of the normal cervical lordosis. There is no listhesis. Moderate to severe disc space narrowing is present from C4-5 to C6-7 with associated degenerative endplate changes. No cervical spine fracture is identified. Moderate spinal stenosis is suspected at C4-5 and C5-6 due to broad-based posterior disc osteophyte complexes, and there is moderate to severe neural foraminal stenosis on the left at C4-5 and on the right at C5-6.  Anasarca is noted. A right jugular central venous catheter is partially visualized. A 2.5 cm hypoattenuating nodule is noted in the right thyroid lobe. Bilateral pleural effusions are partially visualized. Skin staples are noted in the right lower neck.  IMPRESSION: 1. Mildly motion degraded examination. 2. No evidence of acute intracranial abnormality. 3. No acute osseous abnormality identified in the cervical spine. Multilevel cervical disc degeneration. 4. Anasarca and bilateral pleural effusions, incompletely visualized. 5. 2.5 cm right thyroid nodule. Further evaluation with outpatient thyroid ultrasound is suggested.   Electronically Signed   By: Logan Bores   On: 08/29/2014 15:32   Ct Cervical Spine Wo Contrast  08/29/2014   CLINICAL DATA:  Trauma. Unwitnessed fall. Confusion and agitation today. Initial encounter.  EXAM: CT HEAD WITHOUT CONTRAST  CT CERVICAL SPINE WITHOUT CONTRAST  TECHNIQUE: Multidetector CT imaging of the head and cervical spine was performed following the standard protocol without intravenous contrast. Multiplanar CT image reconstructions of the  cervical spine were also generated.  COMPARISON:  None.  FINDINGS: CT HEAD FINDINGS  Study is mildly motion degraded. Punctate hyperdense focus near the foramen of Magendie is consistent with calcification rather than hemorrhage. There is no evidence of acute intracranial hemorrhage. Ventricles and sulci are normal in size for age. There is no evidence of acute large territory infarct, mass, midline shift, or extra-axial fluid collection. Periventricular white-matter hypodensities are nonspecific but compatible with mild chronic small vessel ischemic disease.  Orbits are unremarkable. The visualized mastoid air cells are clear. There is mild right frontal and bilateral  ethmoid sinus mucosal thickening. No skull fracture is identified.  CT CERVICAL SPINE FINDINGS  There is mild motion artifact through the mid to lower cervical spine despite repeating the study. There is straightening of the normal cervical lordosis. There is no listhesis. Moderate to severe disc space narrowing is present from C4-5 to C6-7 with associated degenerative endplate changes. No cervical spine fracture is identified. Moderate spinal stenosis is suspected at C4-5 and C5-6 due to broad-based posterior disc osteophyte complexes, and there is moderate to severe neural foraminal stenosis on the left at C4-5 and on the right at C5-6.  Anasarca is noted. A right jugular central venous catheter is partially visualized. A 2.5 cm hypoattenuating nodule is noted in the right thyroid lobe. Bilateral pleural effusions are partially visualized. Skin staples are noted in the right lower neck.  IMPRESSION: 1. Mildly motion degraded examination. 2. No evidence of acute intracranial abnormality. 3. No acute osseous abnormality identified in the cervical spine. Multilevel cervical disc degeneration. 4. Anasarca and bilateral pleural effusions, incompletely visualized. 5. 2.5 cm right thyroid nodule. Further evaluation with outpatient thyroid ultrasound is  suggested.   Electronically Signed   By: Logan Bores   On: 08/29/2014 15:32   Medications: Infusions: . sodium chloride Stopped (08/24/14 0300)  . sodium chloride      Scheduled Medications: . antiseptic oral rinse  7 mL Mouth Rinse BID  . carvedilol  12.5 mg Oral BID WC  . [START ON 09/03/2014] darbepoetin (ARANESP) injection - DIALYSIS  150 mcg Intravenous Q Tue-HD  . heparin  5,000 Units Subcutaneous 3 times per day  . insulin aspart  0-20 Units Subcutaneous TID AC & HS  . sodium chloride  10-40 mL Intracatheter Q12H    have reviewed scheduled and prn medications.  Physical Exam: General: extubated, alert - better mentally  Heart: RRR Lungs: dec BS at bases Abdomen: distended  Extremities:  pitting edema Dialysis Access: right sided PC    08/31/2014,9:43 AM  LOS: 11 days

## 2014-08-31 NOTE — Progress Notes (Addendum)
[facility] PROGRESS NOTE  Kyle [name] [id number] DOB: [birth year] DOA: 08/20/2014 PCP: No primary care provider on file.     PULMONARY / CRITICAL CARE transfer to [facility] service /3W on 8/6 Assessment/Plan: Principal Problem:   Anasarca Active Problems:   Acute on chronic combined systolic and diastolic CHF, NYHA class 2   CKD (chronic kidney disease) stage 5, GFR less than 15 ml/min   Benign essential HTN   DM (diabetes mellitus), type 2, uncontrolled, with renal complications   Troponin level elevated   Anemia of chronic disease   Cardiorenal syndrome with renal failure   Ascites   Acute respiratory failure, unspecified whether with hypoxia or hypercapnia   Cardiac arrest   Pressure ulcer   Acute respiratory failure with hypoxia    PEA cardiac arrest. Hx of HTN. Cardiology following, continue Coreg twice a day and diuresis with hemodialysis  Possible Aspiration Pneumonia - completing 7 day course of tx  Acute respiratory failure after cardiac arrest.pleural effusion rt?, pulm edema, gross overlaod  Currently on room air  End-stage renal disease- on CRRT from 7/27-8/3, now on hemodialysis, nephrology following, patient will need permanent access prior to discharge, VVS following  Nausea/diarrhea-if recurs will obtain abdominal KUB, rule out C. difficile  Diabetes mellitus-continue SSI,Accuchecks to qAC & HS  Acute encephalopathy after cardiac arrest - Improving,patient did suffer an unwitnessed fall on 8/4. CT scan of the head/neck was negative for any fracture or obvious trauma. Incidentally found thyroid nodule on CT scan of the neck will need to be followed up as an outpatient.  Anemia of chronic disease- nephrology added aranesp  Cardiomyopathy - acute on chronic systolic heart failure - EF 10% - metoprolol, Bb - will increase Coreg 12 BID (was on Coreg previously). Goal 25 BID.  - no ACE-I secondary to renal function - will have EP see him as  outpatient in more elective setting once his neurologic status is confirmed and he is able to tolerate HD, May need cath in future to exclude CAD. 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 Pt was tx to [facility] for placement of tunneled HD cath for initiation of dialysis. He went to OR 7/27 for placement of Tunneled cath (concious sedation only) and intra-operatively (post catheter placement) had bradycardia and ultimately PEA arrest with approx 10 mins CPR before ROSC. No purposeful mental status post arrest. Tx to ICU and PCCM consulted.       Consultants:  Critical care  Nephrology  Cardiology  Vascular surgery    Procedures: STUDIES:  7/27 paracentesis 7/28 Echo >> EF 10% 8/04 CT Head/Neck - negative for fracture or trauma  SIGNIFICANT EVENTS: 7/27 Cardiac arrest, intubated and to ICU, hypothermia 7/29 rewarmed 7/30 more agitated 8/03 extubated 8/04 unwitnessed fall  Antibiotics: Anti-infectives    Start     Dose/Rate Route Frequency Ordered Stop   08/29/14 1000  piperacillin-tazobactam (  ZOSYN) IVPB 2.25 g     2.25 g 100 mL/hr over 30 Minutes Intravenous 3 times per day 08/29/14 0855 08/31/14 0017   08/28/14 1200  piperacillin-tazobactam (ZOSYN) IVPB 3.375 g  Status:  Discontinued     3.375 g 100 mL/hr over 30 Minutes Intravenous 4 times per day 08/28/14 0905 08/28/14 0906   08/28/14 1100  piperacillin-tazobactam (ZOSYN) IVPB 3.375 g  Status:  Discontinued     3.375 g 100 mL/hr over 30 Minutes Intravenous Every 6  hours 08/28/14 0906 08/29/14 0855   08/24/14 1900  vancomycin (VANCOCIN) 1,500 mg in sodium chloride 0.9 % 500 mL IVPB  Status:  Discontinued     1,500 mg 250 mL/hr over 120 Minutes Intravenous Every 48 hours 08/22/14 1830 08/23/14 1332   08/24/14 1800  piperacillin-tazobactam (ZOSYN) IVPB 3.375 g  Status:  Discontinued     3.375 g 12.5 mL/hr over 240 Minutes Intravenous 4 times per day 08/24/14 1715 08/24/14 1719   08/24/14 1800  piperacillin-tazobactam (ZOSYN) IVPB 3.375 g  Status:  Discontinued     3.375 g 100 mL/hr over 30 Minutes Intravenous 4 times per day 08/24/14 1720 08/28/14 0905   08/24/14 1600  nafcillin 1 g in dextrose 5 % 50 mL IVPB  Status:  Discontinued     1 g 100 mL/hr over 30 Minutes Intravenous 6 times per day 08/24/14 1439 08/24/14 1715   08/24/14 1530  nafcillin injection 1 g  Status:  Discontinued     1 g Intravenous Every 4 hours 08/24/14 1428 08/24/14 1439   08/23/14 2100  vancomycin (VANCOCIN) IVPB 1000 mg/200 mL premix  Status:  Discontinued     1,000 mg 200 mL/hr over 60 Minutes Intravenous Every 24 hours 08/23/14 1340 08/24/14 1428   08/23/14 2000  vancomycin (VANCOCIN) IVPB 1000 mg/200 mL premix  Status:  Discontinued     1,000 mg 200 mL/hr over 60 Minutes Intravenous Every 24 hours 08/23/14 1333 08/23/14 1340   08/22/14 1900  vancomycin (VANCOCIN) 1,750 mg in sodium chloride 0.9 % 500 mL IVPB     1,750 mg 250 mL/hr over 120 Minutes Intravenous  Once 08/22/14 1826 08/22/14 2142   08/21/14 1353  ceFAZolin (ANCEF) IVPB 1 g/50 mL premix    Comments:  Send with pt to OR   1 g 100 mL/hr over 30 Minutes Intravenous To [facility] 08/21/14 0837 08/21/14 1500         HPI/Subjective: Patient has diarrhea and nausea,Denies any chest pain or pressure. Patient is somnolent and able to follow commands, reaction time is very slow, does not open his eyes,  Objective: Filed Vitals:   08/30/14 2048 08/31/14 0500 08/31/14 0559 08/31/14 0912  BP:   167/113  166/112  Pulse:    94  Temp: 98.6 F (37 C)  97.8 F (36.6 C)   TempSrc: Oral     Resp:      Height:      Weight:  88 kg (194 lb 0.1 oz) 87 kg (191 lb 12.8 oz)   SpO2:        Intake/Output Summary (Last 24 hours) at 08/31/14 1027 Last data filed at 08/31/14 0914  Gross per 24 hour  Intake    250 ml  Output      0 ml  Net    250 ml    Exam:  *General: Somnolent but able to follow commands Integument: Warm & dry. No rash on exposed skin. HEENT: Moist mucus membranes. No oral ulcers. No  scleral injection. Cardiovascular: Regular rate. Anasarca resolved. No JVD. Pulmonary: Improving bilateral breath sounds. Symmetric chest wall rise. Normal work of breathing on nasal cannula oxygen. Abdomen: Soft. Normal bowel sounds. Nondistended.  Neurological: Patient grossly nonfocal. Moving all 4 extremities equally. Patient is aware of the president but not the year or season. Non-combative.  Data Review   Micro Results Recent Results (from the past 240 hour(s))  Surgical pcr screen     Status: Abnormal   Collection Time: 08/21/14 11:55 AM  Result Value Ref Range Status   MRSA, PCR NEGATIVE NEGATIVE Final   Staphylococcus aureus POSITIVE (A) NEGATIVE Final    Comment:        The Xpert SA Assay (FDA approved for NASAL specimens in patients over 56 years of age), is one component of a comprehensive surveillance program.  Test performance has been validated by [facility] for patients greater than or equal to 70 year old. It is not intended to diagnose infection nor to guide or monitor treatment.   Culture, body fluid-bottle     Status: None   Collection Time: 08/21/14 12:43 PM  Result Value Ref Range Status   Specimen Description FLUID PARACENTESIS  Final   Special Requests NONE  Final   Gram Stain   Final    GRAM POSITIVE COCCI IN CLUSTERS IN BOTH AEROBIC AND ANAEROBIC BOTTLES CRITICAL RESULT CALLED TO, READ BACK BY AND VERIFIED WITH: F ABERION 08/22/14 @ 38 M  VESTAL    Culture STAPHYLOCOCCUS SPECIES (COAGULASE NEGATIVE)  Final   Report Status 08/25/2014 FINAL  Final   Organism ID, Bacteria STAPHYLOCOCCUS SPECIES (COAGULASE NEGATIVE)  Final      Susceptibility   Staphylococcus species (coagulase negative) - MIC*    CIPROFLOXACIN <=0.5 SENSITIVE Sensitive     ERYTHROMYCIN >=8 RESISTANT Resistant     GENTAMICIN <=0.5 SENSITIVE Sensitive     OXACILLIN <=0.25 SENSITIVE Sensitive     TETRACYCLINE <=1 SENSITIVE Sensitive     VANCOMYCIN 2 SENSITIVE Sensitive     TRIMETH/SULFA <=10 SENSITIVE Sensitive     CLINDAMYCIN >=8 RESISTANT Resistant     RIFAMPIN <=0.5 SENSITIVE Sensitive     Inducible Clindamycin NEGATIVE Sensitive     * STAPHYLOCOCCUS SPECIES (COAGULASE NEGATIVE)  Gram stain     Status: None   Collection Time: 08/21/14 12:43 PM  Result Value Ref Range Status   Specimen Description FLUID PARACENTESIS  Final   Special Requests NONE  Final   Gram Stain   Final    FEW WBC PRESENT,BOTH PMN AND MONONUCLEAR NO ORGANISMS SEEN    Report Status 08/22/2014 FINAL  Final  MRSA PCR Screening     Status: None   Collection Time: 08/21/14  4:33 PM  Result Value Ref Range Status   MRSA by PCR NEGATIVE NEGATIVE Final    Comment:        The GeneXpert MRSA Assay (FDA approved for NASAL specimens only), is one component of a comprehensive MRSA colonization surveillance program. It is not intended to diagnose MRSA infection nor to guide or monitor treatment for MRSA infections.   Culture, respiratory (NON-Expectorated)     Status: None   Collection Time: 08/24/14  6:52 PM  Result Value Ref Range Status   Specimen Description TRACHEAL ASPIRATE  Final   Special Requests Normal  Final   Gram Stain   Final    MODERATE WBC PRESENT, PREDOMINANTLY PMN NO SQUAMOUS EPITHELIAL CELLS SEEN FEW GRAM POSITIVE RODS Performed at [facility]  Culture   Final    MODERATE CANDIDA ALBICANS Performed at [facility]    Report Status  08/27/2014 FINAL  Final    Radiology Reports Dg Chest 2 View  08/20/2014   CLINICAL DATA:  Lower extremity edema, CHF and anasarca. History of renal failure.  EXAM: CHEST  2 VIEW  COMPARISON:  01/14/2014 and 06/20/2008 chest radiographs  FINDINGS: Small-moderate bilateral pleural effusions and bibasilar atelectasis again noted.  Mild pulmonary vascular congestion is present.  The visualized cardiomediastinal silhouette is unchanged.  There is no evidence of pneumothorax.  No acute bony abnormalities are identified.  IMPRESSION: Small-moderate bilateral pleural effusions with bibasilar atelectasis.  Mild pulmonary vascular congestion.   Electronically Signed   By: Margarette Canada M.D.   On: 08/20/2014 15:01   Ct Head Wo Contrast  08/29/2014   CLINICAL DATA:  Trauma. Unwitnessed fall. Confusion and agitation today. Initial encounter.  EXAM: CT HEAD WITHOUT CONTRAST  CT CERVICAL SPINE WITHOUT CONTRAST  TECHNIQUE: Multidetector CT imaging of the head and cervical spine was performed following the standard protocol without intravenous contrast. Multiplanar CT image reconstructions of the cervical spine were also generated.  COMPARISON:  None.  FINDINGS: CT HEAD FINDINGS  Study is mildly motion degraded. Punctate hyperdense focus near the foramen of Magendie is consistent with calcification rather than hemorrhage. There is no evidence of acute intracranial hemorrhage. Ventricles and sulci are normal in size for age. There is no evidence of acute large territory infarct, mass, midline shift, or extra-axial fluid collection. Periventricular white-matter hypodensities are nonspecific but compatible with mild chronic small vessel ischemic disease.  Orbits are unremarkable. The visualized mastoid air cells are clear. There is mild right frontal and bilateral ethmoid sinus mucosal thickening. No skull fracture is identified.  CT CERVICAL SPINE FINDINGS  There is mild motion artifact through the mid to lower cervical spine  despite repeating the study. There is straightening of the normal cervical lordosis. There is no listhesis. Moderate to severe disc space narrowing is present from C4-5 to C6-7 with associated degenerative endplate changes. No cervical spine fracture is identified. Moderate spinal stenosis is suspected at C4-5 and C5-6 due to broad-based posterior disc osteophyte complexes, and there is moderate to severe neural foraminal stenosis on the left at C4-5 and on the right at C5-6.  Anasarca is noted. A right jugular central venous catheter is partially visualized. A 2.5 cm hypoattenuating nodule is noted in the right thyroid lobe. Bilateral pleural effusions are partially visualized. Skin staples are noted in the right lower neck.  IMPRESSION: 1. Mildly motion degraded examination. 2. No evidence of acute intracranial abnormality. 3. No acute osseous abnormality identified in the cervical spine. Multilevel cervical disc degeneration. 4. Anasarca and bilateral pleural effusions, incompletely visualized. 5. 2.5 cm right thyroid nodule. Further evaluation with outpatient thyroid ultrasound is suggested.   Electronically Signed   By: Logan Bores   On: 08/29/2014 15:32   Ct Cervical Spine Wo Contrast  08/29/2014   CLINICAL DATA:  Trauma. Unwitnessed fall. Confusion and agitation today. Initial encounter.  EXAM: CT HEAD WITHOUT CONTRAST  CT CERVICAL SPINE WITHOUT CONTRAST  TECHNIQUE: Multidetector CT imaging of the head and cervical spine was performed following the standard protocol without intravenous contrast. Multiplanar CT image reconstructions of the cervical spine were also generated.  COMPARISON:  None.  FINDINGS: CT HEAD FINDINGS  Study is mildly motion degraded. Punctate hyperdense focus near the foramen of Magendie is consistent with calcification rather than hemorrhage. There is  no evidence of acute intracranial hemorrhage. Ventricles and sulci are normal in size for age. There is no evidence of acute large  territory infarct, mass, midline shift, or extra-axial fluid collection. Periventricular white-matter hypodensities are nonspecific but compatible with mild chronic small vessel ischemic disease.  Orbits are unremarkable. The visualized mastoid air cells are clear. There is mild right frontal and bilateral ethmoid sinus mucosal thickening. No skull fracture is identified.  CT CERVICAL SPINE FINDINGS  There is mild motion artifact through the mid to lower cervical spine despite repeating the study. There is straightening of the normal cervical lordosis. There is no listhesis. Moderate to severe disc space narrowing is present from C4-5 to C6-7 with associated degenerative endplate changes. No cervical spine fracture is identified. Moderate spinal stenosis is suspected at C4-5 and C5-6 due to broad-based posterior disc osteophyte complexes, and there is moderate to severe neural foraminal stenosis on the left at C4-5 and on the right at C5-6.  Anasarca is noted. A right jugular central venous catheter is partially visualized. A 2.5 cm hypoattenuating nodule is noted in the right thyroid lobe. Bilateral pleural effusions are partially visualized. Skin staples are noted in the right lower neck.  IMPRESSION: 1. Mildly motion degraded examination. 2. No evidence of acute intracranial abnormality. 3. No acute osseous abnormality identified in the cervical spine. Multilevel cervical disc degeneration. 4. Anasarca and bilateral pleural effusions, incompletely visualized. 5. 2.5 cm right thyroid nodule. Further evaluation with outpatient thyroid ultrasound is suggested.   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  PROCEDURE: An ultrasound guided paracentesis was thoroughly discussed with  the patient and questions answered. The benefits, risks, alternatives and complications were also discussed. The patient understands and wishes to proceed with the procedure. Written consent was obtained.  Ultrasound was performed to localize and mark an adequate pocket of fluid in the left lower quadrant of the abdomen. The area was then prepped and draped in the normal sterile fashion. 1% Lidocaine was used for local anesthesia. Under ultrasound guidance a 19 gauge Yueh catheter was introduced. Paracentesis was performed. The catheter was removed and a dressing applied.  COMPLICATIONS: None.  FINDINGS: A total of approximately 2.5 liters of clear yellow fluid was removed. A fluid sample was sent for laboratory analysis.  IMPRESSION: Successful ultrasound guided paracentesis yielding 2.5 liters of ascites.  Read by:  Gareth Eagle, PA-C   Electronically Signed   By: Sandi Mariscal M.D.   On: 08/21/2014 14:24   Dg Chest Port 1 View  08/26/2014   CLINICAL DATA:  CHF, acute respiratory failure, anasarca  EXAM: PORTABLE CHEST - 1 VIEW  COMPARISON:  Portable chest x-ray of August 25, 2014  FINDINGS: The lungs remain hypoinflated. There are persistent bilateral pleural effusions as well bibasilar atelectasis or infiltrate. There is no pneumothorax. The cardiac silhouette is largely obscured. The central pulmonary vascularity is more distinct today. The endotracheal tube tip lies 2 cm above the carina. The esophagogastric tube tip projects below the inferior margin of the image. The left internal jugular venous catheter tip projects over the midportion of the SVC. The large caliber dual-lumen right internal jugular catheter projects over lower third of the SVC.  IMPRESSION: Slight interval improvement in the appearance of the  will pulmonary interstitium and central pulmonary vascularity may reflect improving pulmonary edema. Bilateral pleural effusions and bibasilar atelectasis or infiltrate persists. The support tubes are in  reasonable position.   Electronically Signed   By: David  Martinique M.D.   On: 08/26/2014 07:12   Dg Chest Port 1 View  08/25/2014   CLINICAL DATA:  Cardiac arrest  EXAM: PORTABLE CHEST - 1 VIEW  COMPARISON:  Radiograph 08/24/2014  FINDINGS: Endotracheal tube is approximately 1 cm from the carina. LEFT central venous line and NG tube are unchanged. There are low lung volumes and basilar atelectasis. Small effusions noted. Central venous congestion present. No pneumothorax.  IMPRESSION: 1. Endotracheal tube is relatively low approximately 1 cm from carina. No significant change. 2. Low lung volumes, bibasilar atelectasis and small effusions.   Electronically Signed   By: Suzy Bouchard M.D.   On: 08/25/2014 09:09   Dg Chest Port 1 View  08/24/2014   CLINICAL DATA:  Acute respiratory failure, hypoxia. Exchange of endotracheal tube.  EXAM: PORTABLE CHEST - 1 VIEW  COMPARISON:  08/24/2014  FINDINGS: Endotracheal tube is 2.5 cm above the carina. Left central line and right dialysis catheter is well is NG tube are unchanged.  Bilateral perihilar and lower lobe opacities with layering effusions, unchanged.  IMPRESSION: Endotracheal tube 2.5 cm above the carina. Otherwise no significant change.   Electronically Signed   By: Rolm Baptise M.D.   On: 08/24/2014 18:29   Dg Chest Port 1 View  08/24/2014   CLINICAL DATA:  Respiratory failure  EXAM: PORTABLE CHEST - 1 VIEW  COMPARISON:  08/23/2014  FINDINGS: Layering moderate right pleural effusion. Small left pleural effusion.  Associated bilateral lower lobe opacities, likely atelectasis.  Pulmonary vascular congestion without frank interstitial edema.  Endotracheal tube terminates 5.5 cm above the carina.  The heart is normal in size.  Right IJ dual lumen dialysis catheter with its tip in the right atrium. Left IJ venous catheter with its tip in the mid SVC.  Enteric tube courses below the diaphragm.  IMPRESSION: Pulmonary vascular congestion without frank interstitial  edema.  Layering moderate right pleural effusion. Small left pleural effusion.  Associated bilateral lower lobe opacities, likely atelectasis.  Endotracheal tube terminates 5.5 cm above the carina. Additional support apparatus as above.   Electronically Signed   By: Julian Hy M.D.   On: 08/24/2014 08:17   Dg Chest Port 1 View  08/23/2014   CLINICAL DATA:  Respiratory difficulty  EXAM: PORTABLE CHEST - 1 VIEW  COMPARISON:  08/21/2014  FINDINGS: Tubular devices are stable. Normal heart size. Bilateral pleural effusions are stable. Central airspace disease is stable. No pneumothorax.  IMPRESSION: Stable bilateral pleural effusions and bilateral central airspace disease.   Electronically Signed   By: Marybelle Killings M.D.   On: 08/23/2014 07:44   Dg Chest Port 1 View  08/21/2014   CLINICAL DATA:  Hypoxia  EXAM: PORTABLE CHEST - 1 VIEW  COMPARISON:  August 20, 2014  FINDINGS: Endotracheal tube tip is 2.9 cm above the carina. Left jugular catheter tip is in the superior vena cava just beyond the junction with the left innominate vein. Dual-lumen catheter tip is in the right atrium. Nasogastric tube tip and side port are below the diaphragm. No pneumothorax. There is cardiomegaly with bilateral effusions and alveolar edema bilaterally. No adenopathy.  IMPRESSION: Congestive heart failure. Tube and catheter positions as described without pneumothorax.   Electronically Signed   By: Lowella Grip III M.D.  On: 08/21/2014 18:41   Dg Abd Portable 1v  08/25/2014   CLINICAL DATA:  Ileus.  EXAM: PORTABLE ABDOMEN - 1 VIEW  COMPARISON:  08/21/2014  FINDINGS: NG tube remains in the stomach. There is a nonobstructive bowel gas pattern. No gaseous distention to suggest ileus. No free air or visible organomegaly or suspicious calcification. No acute bony abnormality.  IMPRESSION: No evidence of obstruction or ileus.   Electronically Signed   By: Rolm Baptise M.D.   On: 08/25/2014 12:43   Dg Abd Portable 1v  08/21/2014    CLINICAL DATA:  OG tube placement  EXAM: PORTABLE ABDOMEN - 1 VIEW  COMPARISON:  None.  FINDINGS: NG tube with tip in the gastric body. Paucity of gas in the abdomen. Bilateral pleural effusions.  IMPRESSION: NG tube with tip in the stomach.   Electronically Signed   By: Suzy Bouchard M.D.   On: 08/21/2014 18:42   Dg Fluoro Guide Cv Line-no Report  08/21/2014   CLINICAL DATA:    FLOURO GUIDE CV LINE  Fluoroscopy was utilized by the requesting physician.  No radiographic  interpretation.      CBC  Recent Labs Lab 08/25/14 0430 08/26/14 0420 08/28/14 0420 08/30/14 0220 08/31/14 0724  WBC 8.7 7.5 8.6 11.9* 9.2  HGB 8.8* 8.6* 10.7* 9.4* 8.9*  HCT 27.2* 26.3* 34.1* 29.1* 27.8*  PLT 193 174 236 254 264  MCV 83.4 83.5 83.8 82.9 83.2  MCH 27.0 27.3 26.3 26.8 26.6  MCHC 32.4 32.7 31.4 32.3 32.0  RDW 19.3* 19.3* 19.6* 19.5* 19.4*  LYMPHSABS 0.8 0.8  --  0.9 0.9  MONOABS 0.6 0.6  --  1.2* 0.8  EOSABS 0.0 0.1  --  0.0 0.1  BASOSABS 0.0 0.0  --  0.0 0.0    Chemistries   Recent Labs Lab 08/25/14 0430  08/26/14 0420  08/27/14 0345  08/28/14 0420 08/28/14 1700 08/29/14 0430 08/30/14 0220 08/31/14 0724  NA 139  < > 136  < > 135  < > 137 138 138 140 139  K 4.3  < > 4.0  < > 4.0  < > 4.1 4.0 4.1 4.3 3.8  CL 105  < > 105  < > 100*  < > 103 105 104 103 103  CO2 26  < > 26  < > 25  < > 27 27 25 23 22   GLUCOSE 93  < > 122*  < > 124*  < > 157* 85 101* 78 88  BUN 62*  < > 51*  < > 46*  < > 41* 44* 53* 32* 48*  CREATININE 6.07*  < > 4.44*  < > 3.35*  < > 2.52* 2.49* 3.03* 2.76* 4.12*  CALCIUM 7.1*  < > 7.2*  < > 7.7*  < > 8.0* 8.2* 8.2* 8.2* 8.4*  MG 1.9  --  2.0  --  2.2  --  2.4  --  2.3 2.2 2.3  AST 17  --  20  --   --   --   --   --   --   --   --   ALT 15*  --  14*  --   --   --   --   --   --   --   --   ALKPHOS 85  --  77  --   --   --   --   --   --   --   --   BILITOT  0.5  --  0.4  --   --   --   --   --   --   --   --   < > = values in this interval not  displayed. ------------------------------------------------------------------------------------------------------------------ estimated creatinine clearance is 21.2 mL/min (by C-G formula based on Cr of 4.12). ------------------------------------------------------------------------------------------------------------------ No results for input(s): HGBA1C in the last 72 hours. ------------------------------------------------------------------------------------------------------------------ No results for input(s): CHOL, HDL, LDLCALC, TRIG, CHOLHDL, LDLDIRECT in the last 72 hours. ------------------------------------------------------------------------------------------------------------------ No results for input(s): TSH, T4TOTAL, T3FREE, THYROIDAB in the last 72 hours.  Invalid input(s): FREET3 ------------------------------------------------------------------------------------------------------------------ No results for input(s): VITAMINB12, FOLATE, FERRITIN, TIBC, IRON, RETICCTPCT in the last 72 hours.  Coagulation profile No results for input(s): INR, PROTIME in the last 168 hours.  No results for input(s): DDIMER in the last 72 hours.  Cardiac Enzymes No results for input(s): CKMB, TROPONINI, MYOGLOBIN in the last 168 hours.  Invalid input(s): CK ------------------------------------------------------------------------------------------------------------------ Invalid input(s): POCBNP   CBG:  Recent Labs Lab 08/30/14 0759 08/30/14 1147 08/30/14 1632 08/30/14 2055 08/31/14 0746  GLUCAP 86 123* 107* 96 93       Studies: Ct Head Wo Contrast  08/29/2014   CLINICAL DATA:  Trauma. Unwitnessed fall. Confusion and agitation today. Initial encounter.  EXAM: CT HEAD WITHOUT CONTRAST  CT CERVICAL SPINE WITHOUT CONTRAST  TECHNIQUE: Multidetector CT imaging of the head and cervical spine was performed following the standard protocol without intravenous contrast. Multiplanar CT image  reconstructions of the cervical spine were also generated.  COMPARISON:  None.  FINDINGS: CT HEAD FINDINGS  Study is mildly motion degraded. Punctate hyperdense focus near the foramen of Magendie is consistent with calcification rather than hemorrhage. There is no evidence of acute intracranial hemorrhage. Ventricles and sulci are normal in size for age. There is no evidence of acute large territory infarct, mass, midline shift, or extra-axial fluid collection. Periventricular white-matter hypodensities are nonspecific but compatible with mild chronic small vessel ischemic disease.  Orbits are unremarkable. The visualized mastoid air cells are clear. There is mild right frontal and bilateral ethmoid sinus mucosal thickening. No skull fracture is identified.  CT CERVICAL SPINE FINDINGS  There is mild motion artifact through the mid to lower cervical spine despite repeating the study. There is straightening of the normal cervical lordosis. There is no listhesis. Moderate to severe disc space narrowing is present from C4-5 to C6-7 with associated degenerative endplate changes. No cervical spine fracture is identified. Moderate spinal stenosis is suspected at C4-5 and C5-6 due to broad-based posterior disc osteophyte complexes, and there is moderate to severe neural foraminal stenosis on the left at C4-5 and on the right at C5-6.  Anasarca is noted. A right jugular central venous catheter is partially visualized. A 2.5 cm hypoattenuating nodule is noted in the right thyroid lobe. Bilateral pleural effusions are partially visualized. Skin staples are noted in the right lower neck.  IMPRESSION: 1. Mildly motion degraded examination. 2. No evidence of acute intracranial abnormality. 3. No acute osseous abnormality identified in the cervical spine. Multilevel cervical disc degeneration. 4. Anasarca and bilateral pleural effusions, incompletely visualized. 5. 2.5 cm right thyroid nodule. Further evaluation with outpatient  thyroid ultrasound is suggested.   Electronically Signed   By: Logan Bores   On: 08/29/2014 15:32   Ct Cervical Spine Wo Contrast  08/29/2014   CLINICAL DATA:  Trauma. Unwitnessed fall. Confusion and agitation today. Initial encounter.  EXAM: CT HEAD WITHOUT CONTRAST  CT CERVICAL SPINE WITHOUT CONTRAST  TECHNIQUE: Multidetector  CT imaging of the head and cervical spine was performed following the standard protocol without intravenous contrast. Multiplanar CT image reconstructions of the cervical spine were also generated.  COMPARISON:  None.  FINDINGS: CT HEAD FINDINGS  Study is mildly motion degraded. Punctate hyperdense focus near the foramen of Magendie is consistent with calcification rather than hemorrhage. There is no evidence of acute intracranial hemorrhage. Ventricles and sulci are normal in size for age. There is no evidence of acute large territory infarct, mass, midline shift, or extra-axial fluid collection. Periventricular white-matter hypodensities are nonspecific but compatible with mild chronic small vessel ischemic disease.  Orbits are unremarkable. The visualized mastoid air cells are clear. There is mild right frontal and bilateral ethmoid sinus mucosal thickening. No skull fracture is identified.  CT CERVICAL SPINE FINDINGS  There is mild motion artifact through the mid to lower cervical spine despite repeating the study. There is straightening of the normal cervical lordosis. There is no listhesis. Moderate to severe disc space narrowing is present from C4-5 to C6-7 with associated degenerative endplate changes. No cervical spine fracture is identified. Moderate spinal stenosis is suspected at C4-5 and C5-6 due to broad-based posterior disc osteophyte complexes, and there is moderate to severe neural foraminal stenosis on the left at C4-5 and on the right at C5-6.  Anasarca is noted. A right jugular central venous catheter is partially visualized. A 2.5 cm hypoattenuating nodule is noted in  the right thyroid lobe. Bilateral pleural effusions are partially visualized. Skin staples are noted in the right lower neck.  IMPRESSION: 1. Mildly motion degraded examination. 2. No evidence of acute intracranial abnormality. 3. No acute osseous abnormality identified in the cervical spine. Multilevel cervical disc degeneration. 4. Anasarca and bilateral pleural effusions, incompletely visualized. 5. 2.5 cm right thyroid nodule. Further evaluation with outpatient thyroid ultrasound is suggested.   Electronically Signed   By: Logan Bores   On: 08/29/2014 15:32      Lab Results  Component Value Date   HGBA1C 7.6* 08/20/2014   HGBA1C 6.5* 01/13/2014   Lab Results  Component Value Date   CREATININE 4.12* 08/31/2014       Scheduled Meds: . antiseptic oral rinse  7 mL Mouth Rinse BID  . carvedilol  12.5 mg Oral BID WC  . [START ON 09/03/2014] darbepoetin (ARANESP) injection - DIALYSIS  150 mcg Intravenous Q Tue-HD  . heparin  5,000 Units Subcutaneous 3 times per day  . insulin aspart  0-20 Units Subcutaneous TID AC & HS  . sodium chloride  10-40 mL Intracatheter Q12H   Continuous Infusions: . sodium chloride Stopped (08/24/14 0300)  . sodium chloride      Principal Problem:   Anasarca Active Problems:   Acute on chronic combined systolic and diastolic CHF, NYHA class 2   CKD (chronic kidney disease) stage 5, GFR less than 15 ml/min   Benign essential HTN   DM (diabetes mellitus), type 2, uncontrolled, with renal complications   Troponin level elevated   Anemia of chronic disease   Cardiorenal syndrome with renal failure   Ascites   Acute respiratory failure, unspecified whether with hypoxia or hypercapnia   Cardiac arrest   Pressure ulcer   Acute respiratory failure with hypoxia    Time spent: 45 minutes   [facility] Pager [phone]. If 7PM-7AM, please contact night-coverage at [email or web address], password [facility] 08/31/2014, 10:27 AM  LOS: 11 days

## 2014-09-01 LAB — GLUCOSE, CAPILLARY
GLUCOSE-CAPILLARY: 142 mg/dL — AB (ref 65–99)
GLUCOSE-CAPILLARY: 122 mg/dL — AB (ref 65–99)
Glucose-Capillary: 119 mg/dL — ABNORMAL HIGH (ref 65–99)
Glucose-Capillary: 143 mg/dL — ABNORMAL HIGH (ref 65–99)
Glucose-Capillary: 133 mg/dL — ABNORMAL HIGH (ref 65–99)

## 2014-09-01 LAB — RENAL FUNCTION PANEL
ALBUMIN: 2.2 g/dL — AB (ref 3.5–5.0)
ANION GAP: 11 (ref 5–15)
BUN: 26 mg/dL — AB (ref 6–20)
CHLORIDE: 102 mmol/L (ref 101–111)
CO2: 26 mmol/L (ref 22–32)
CREATININE: 3.2 mg/dL — AB (ref 0.61–1.24)
Calcium: 8.4 mg/dL — ABNORMAL LOW (ref 8.9–10.3)
GFR calc Af Amer: 23 mL/min — ABNORMAL LOW (ref >60)
GFR, EST NON AFRICAN AMERICAN: 20 mL/min — AB (ref >60)
Glucose, Bld: 99 mg/dL (ref 65–99)
PHOSPHORUS: 3 mg/dL (ref 2.5–4.6)
POTASSIUM: 3.7 mmol/L (ref 3.5–5.1)
Sodium: 139 mmol/L (ref 135–145)

## 2014-09-01 LAB — CBC WITH DIFFERENTIAL/PLATELET
Basophils Absolute: 0 10*3/uL (ref 0.0–0.1)
Basophils Relative: 0 % (ref 0–1)
Eosinophils Absolute: 0 10*3/uL (ref 0.0–0.7)
Eosinophils Relative: 0 % (ref 0–5)
HCT: 30.1 % — ABNORMAL LOW (ref 39.0–52.0)
Hemoglobin: 9.5 g/dL — ABNORMAL LOW (ref 13.0–17.0)
Lymphocytes Relative: 9 % — ABNORMAL LOW (ref 12–46)
Lymphs Abs: 0.8 10*3/uL (ref 0.7–4.0)
MCH: 26.5 pg (ref 26.0–34.0)
MCHC: 31.6 g/dL (ref 30.0–36.0)
MCV: 84.1 fL (ref 78.0–100.0)
MONOS PCT: 11 % (ref 3–12)
Monocytes Absolute: 1 10*3/uL (ref 0.1–1.0)
Neutro Abs: 7.6 10*3/uL (ref 1.7–7.7)
Neutrophils Relative %: 80 % — ABNORMAL HIGH (ref 43–77)
Platelets: 287 10*3/uL (ref 150–400)
RBC: 3.58 MIL/uL — ABNORMAL LOW (ref 4.22–5.81)
RDW: 19.9 % — ABNORMAL HIGH (ref 11.5–15.5)
WBC: 9.6 10*3/uL (ref 4.0–10.5)

## 2014-09-01 LAB — MAGNESIUM: Magnesium: 2.2 mg/dL (ref 1.7–2.4)

## 2014-09-01 MED ORDER — HYDRALAZINE HCL 25 MG PO TABS
25.0000 mg | ORAL_TABLET | Freq: Four times a day (QID) | ORAL | Status: DC
  Administered 2014-09-01 – 2014-09-07 (×20): 25 mg via ORAL
  Filled 2014-09-01 (×21): qty 1

## 2014-09-01 MED ORDER — CALCITRIOL 0.25 MCG PO CAPS
0.2500 ug | ORAL_CAPSULE | Freq: Every day | ORAL | Status: DC
  Administered 2014-09-01: 0.25 ug via ORAL
  Filled 2014-09-01: qty 1

## 2014-09-01 NOTE — Progress Notes (Signed)
Subjective:  He is much more awake today.  Conversant.  Denies angina.  Denies shortness of breath.  Objective:  Vital Signs in the last 24 hours: BP 170/115 mmHg  Pulse 96  Temp(Src) 98.2 F (36.8 C) (Axillary)  Resp 22  Ht 6' (1.829 m)  Wt 80 kg (176 lb 5.9 oz)  BMI 23.91 kg/m2  SpO2 100%  Physical Exam: Pleasant thin black male currently in no acute distress Lungs:  Clear  Cardiac:  Regular rhythm, normal S1 and S2, no S3, no murmur heard Abdomen:  Soft, nontender, no masses Extremities:  1+ edema present  Intake/Output from previous day: 08/06 0701 - 08/07 0700 In: 370 [P.O.:360; I.V.:10] Out: 5000  Weight Filed Weights   08/31/14 0500 08/31/14 0559 08/31/14 1811  Weight: 88 kg (194 lb 0.1 oz) 87 kg (191 lb 12.8 oz) 80 kg (176 lb 5.9 oz)    Lab Results: Basic Metabolic Panel:  Recent Labs  08/31/14 0724 09/01/14 0415  NA 139 139  K 3.8 3.7  CL 103 102  CO2 22 26  GLUCOSE 88 99  BUN 48* 26*  CREATININE 4.12* 3.20*    CBC:  Recent Labs  08/31/14 0724 09/01/14 0415  WBC 9.2 9.6  NEUTROABS 7.5 7.6  HGB 8.9* 9.5*  HCT 27.8* 30.1*  MCV 83.2 84.1  PLT 264 287    BNP    Component Value Date/Time   BNP >4500.0* 08/20/2014 1350    PROTIME: Lab Results  Component Value Date   INR 1.29 08/22/2014   INR 1.30 08/21/2014   INR 1.28 08/20/2014    Telemetry: Currently normal sinus rhythm  Assessment/Plan:  1.  Cardiomyopathy type undetermined probable hypertensive but yet to be determined 2.  Acute on chronic systolic heart failure compensated 3.  Prior data cardiac cardiac arrest with Arctic sign improving-supposed to have EP consult as an outpatient 4.  End-stage renal disease on hemodialysis 5.  Poorly controlled hypertension  Recommendations:  I would go ahead and add hydralazine to help with blood pressure control his pressure is still quite elevated.  Will need good blood pressure control.     Kerry Hough  MD  [facility]  09/01/2014, 10:14 AM

## 2014-09-01 NOTE — Progress Notes (Signed)
Subjective:  Had HD yest with 5 liters removed-  seems  better mentally - BP is still high  Objective Vital signs in last 24 hours: Filed Vitals:   08/31/14 1800 08/31/14 1811 08/31/14 2138 09/01/14 0743  BP: 141/86 150/98 161/109 170/115  Pulse: 76 81 91 96  Temp:  97.9 F (36.6 C) 98.2 F (36.8 C)   TempSrc:  Oral Axillary   Resp:  22    Height:      Weight:  80 kg (176 lb 5.9 oz)    SpO2:  100% 100%    Weight change: -6 kg (-13 lb 3.6 oz)  Intake/Output Summary (Last 24 hours) at 09/01/14 0950 Last data filed at 08/31/14 1811  Gross per 24 hour  Intake    240 ml  Output   5000 ml  Net  -4760 ml    Assessment/ Plan: Pt is a 59 y.o. yo male with known stage 5 CKD who was admitted on 08/20/2014 with SOB and worsening renal function.  Then had cardiac arrest in the setting of having Permcath placed on 7/27  Assessment/Plan: 1. Renal- progressed to ESRD recently-  was placed on CRRT due to events - on from 7/27-8/3- responded well-able to remove much fluid but still has a little ways to go.  Had IHD 8/4-  And on 8/6  Will need OP HD arrangements and perm access eventually prior to discharge- maybe early next week ?   [facility] has seen but wanted to wait for him to be more stable. Since volume good and numbers good will plan for next HD on Tuesday because anticipate a TTS schedule as op  2. Anemia- related to events and CKD- iron low- repleting and have added aranesp.   3. HTN/volume- massive volume overload- needed much volume removal- made dent with several days of CRRT- now on IHD- also coreg and PRN hydralazine- finally seems to be close to EDW- down 27 kg ! 4. Elytes-  K and phos stable- no recent PTH - last was 386- will give calcitriol  5. VDRF- now extubated 6. S/p arrest- neuro improvements- but not to baseline 7. ID- ?peritonitis-  per CCM- completed course of abx  Asucena Galer A    Labs: Basic Metabolic Panel:  Recent Labs Lab 08/30/14 0220 08/31/14 0724  09/01/14 0415  NA 140 139 139  K 4.3 3.8 3.7  CL 103 103 102  CO2 23 22 26   GLUCOSE 78 88 99  BUN 32* 48* 26*  CREATININE 2.76* 4.12* 3.20*  CALCIUM 8.2* 8.4* 8.4*  PHOS 2.7 3.9 3.0   Liver Function Tests:  Recent Labs Lab 08/26/14 0420  08/30/14 0220 08/31/14 0724 09/01/14 0415  AST 20  --   --   --   --   ALT 14*  --   --   --   --   ALKPHOS 77  --   --   --   --   BILITOT 0.4  --   --   --   --   PROT 5.1*  --   --   --   --   ALBUMIN 1.7*  < > 2.3* 2.2* 2.2*  < > = values in this interval not displayed. No results for input(s): LIPASE, AMYLASE in the last 168 hours. No results for input(s): AMMONIA in the last 168 hours. CBC:  Recent Labs Lab 08/26/14 0420 08/28/14 0420 08/30/14 0220 08/31/14 0724 09/01/14 0415  WBC 7.5 8.6 11.9* 9.2 9.6  NEUTROABS 6.0  --  9.7* 7.5 7.6  HGB 8.6* 10.7* 9.4* 8.9* 9.5*  HCT 26.3* 34.1* 29.1* 27.8* 30.1*  MCV 83.5 83.8 82.9 83.2 84.1  PLT 174 236 254 264 287   Cardiac Enzymes: No results for input(s): CKTOTAL, CKMB, CKMBINDEX, TROPONINI in the last 168 hours. CBG:  Recent Labs Lab 08/30/14 2055 08/31/14 0746 08/31/14 1153 08/31/14 2135 09/01/14 0734  GLUCAP 96 93 140* 114* 119*    Iron Studies:  No results for input(s): IRON, TIBC, TRANSFERRIN, FERRITIN in the last 72 hours. Studies/Results: No results found. Medications: Infusions: . sodium chloride Stopped (08/24/14 0300)  . sodium chloride      Scheduled Medications: . antiseptic oral rinse  7 mL Mouth Rinse BID  . carvedilol  12.5 mg Oral BID WC  . [START ON 09/03/2014] darbepoetin (ARANESP) injection - DIALYSIS  150 mcg Intravenous Q Tue-HD  . heparin  5,000 Units Subcutaneous 3 times per day  . insulin aspart  0-20 Units Subcutaneous TID AC & HS  . sodium chloride  10-40 mL Intracatheter Q12H    have reviewed scheduled and prn medications.  Physical Exam: General: extubated, alert - better mentally  Heart: RRR Lungs: dec BS at bases Abdomen:  distended  Extremities:  pitting edema Dialysis Access: right sided PC    09/01/2014,9:50 AM  LOS: 12 days

## 2014-09-01 NOTE — Progress Notes (Signed)
[facility] PROGRESS NOTE  Kyle [name] [id number] DOB: 04-25-1955 DOA: 08/20/2014 PCP: No primary care provider on file.     PULMONARY / CRITICAL CARE transfer to [facility] service /3W on 8/6 Assessment/Plan: Principal Problem:   Anasarca Active Problems:   Acute on chronic combined systolic and diastolic CHF, NYHA class 2   CKD (chronic kidney disease) stage 5, GFR less than 15 ml/min   Benign essential HTN   DM (diabetes mellitus), type 2, uncontrolled, with renal complications   Troponin level elevated   Anemia of chronic disease   Cardiorenal syndrome with renal failure   Ascites   Acute respiratory failure, unspecified whether with hypoxia or hypercapnia   Cardiac arrest   Pressure ulcer   Acute respiratory failure with hypoxia    PEA cardiac arrest. Hx of HTN. Cardiology following, continue Coreg twice a day and diuresis with hemodialysis EP consult as an outpatient  Possible Aspiration Pneumonia - completing 7 day course of tx  Acute respiratory failure after cardiac arrest.pleural effusion rt?, pulm edema, gross overlaod  Currently on room air  End-stage renal disease- on CRRT from 7/27-8/3, now on hemodialysis, Had IHD 8/4- And on 8/6 Will need OP HD  and perm access   prior to discharge- . [facility] has seen but wanted to wait for him to be more stable.  Anticipate future hemodialysis Tuesday Thursday Saturday  Nausea/diarrhea-if recurs will obtain abdominal KUB, rule out C. difficile  Diabetes mellitus-continue SSI,Accuchecks to qAC & HS  Acute encephalopathy after cardiac arrest - Improving,patient did suffer an unwitnessed fall on 8/4. CT scan of the head/neck was negative for any fracture or obvious trauma. Incidentally found thyroid nodule on CT scan of the neck will need to be followed up as an outpatient.  Anemia of chronic disease- nephrology added aranesp  Cardiomyopathy - acute on chronic systolic heart failure - EF 10% - metoprolol, Bb  - continue Coreg 12 BID (was on Coreg previously). Goal 25 BID. Started on hydralazine by cardiology - no ACE-I secondary to renal function - will have EP see him as outpatient in more elective setting once his neurologic status is confirmed and he is able to tolerate HD, May need cath in future to exclude CAD. More likely hypertensive cardiomyopathy     Code Status:      Code Status Orders        Start     Ordered   08/21/14 1649  Full code   Continuous     08/21/14 1652     Family Communication: family updated about patient's clinical progress Disposition Plan:  Physical therapy recommends CIR   Brief narrative: 59 yo male with PMHx of CKD Stage V (non-compliant with f/u), chronic combined CHF (EF 15%), HTN and T2DM initially admitted 7/26 to [facility] with acute on chronic CHF and acute on CKD with SCr 10.7. Pt was tx to [facility] for placement of tunneled HD cath for initiation of dialysis. He went to OR 7/27 for placement of Tunneled cath (concious sedation only) and intra-operatively (post catheter placement) had bradycardia and ultimately PEA arrest with approx 10 mins CPR before ROSC. No purposeful mental status post arrest. Tx to ICU and PCCM consulted.       Consultants:  Critical care  Nephrology  Cardiology  Vascular surgery    Procedures: STUDIES:  7/27 paracentesis 7/28 Echo >> EF 10% 8/04 CT Head/Neck - negative for fracture or trauma  SIGNIFICANT EVENTS: 7/27 Cardiac arrest,  intubated and to ICU, hypothermia 7/29 rewarmed 7/30 more agitated 8/03 extubated 8/04 unwitnessed fall  Antibiotics: Anti-infectives    Start     Dose/Rate Route Frequency Ordered Stop   08/29/14 1000  piperacillin-tazobactam (ZOSYN) IVPB 2.25 g     2.25 g 100 mL/hr over 30 Minutes Intravenous 3 times per day 08/29/14 0855 08/31/14 0017   08/28/14 1200  piperacillin-tazobactam (ZOSYN) IVPB 3.375 g  Status:  Discontinued     3.375 g 100 mL/hr over 30 Minutes  Intravenous 4 times per day 08/28/14 0905 08/28/14 0906   08/28/14 1100  piperacillin-tazobactam (ZOSYN) IVPB 3.375 g  Status:  Discontinued     3.375 g 100 mL/hr over 30 Minutes Intravenous Every 6 hours 08/28/14 0906 08/29/14 0855   08/24/14 1900  vancomycin (VANCOCIN) 1,500 mg in sodium chloride 0.9 % 500 mL IVPB  Status:  Discontinued     1,500 mg 250 mL/hr over 120 Minutes Intravenous Every 48 hours 08/22/14 1830 08/23/14 1332   08/24/14 1800  piperacillin-tazobactam (ZOSYN) IVPB 3.375 g  Status:  Discontinued     3.375 g 12.5 mL/hr over 240 Minutes Intravenous 4 times per day 08/24/14 1715 08/24/14 1719   08/24/14 1800  piperacillin-tazobactam (ZOSYN) IVPB 3.375 g  Status:  Discontinued     3.375 g 100 mL/hr over 30 Minutes Intravenous 4 times per day 08/24/14 1720 08/28/14 0905   08/24/14 1600  nafcillin 1 g in dextrose 5 % 50 mL IVPB  Status:  Discontinued     1 g 100 mL/hr over 30 Minutes Intravenous 6 times per day 08/24/14 1439 08/24/14 1715   08/24/14 1530  nafcillin injection 1 g  Status:  Discontinued     1 g Intravenous Every 4 hours 08/24/14 1428 08/24/14 1439   08/23/14 2100  vancomycin (VANCOCIN) IVPB 1000 mg/200 mL premix  Status:  Discontinued     1,000 mg 200 mL/hr over 60 Minutes Intravenous Every 24 hours 08/23/14 1340 08/24/14 1428   08/23/14 2000  vancomycin (VANCOCIN) IVPB 1000 mg/200 mL premix  Status:  Discontinued     1,000 mg 200 mL/hr over 60 Minutes Intravenous Every 24 hours 08/23/14 1333 08/23/14 1340   08/22/14 1900  vancomycin (VANCOCIN) 1,750 mg in sodium chloride 0.9 % 500 mL IVPB     1,750 mg 250 mL/hr over 120 Minutes Intravenous  Once 08/22/14 1826 08/22/14 2142   08/21/14 1353  ceFAZolin (ANCEF) IVPB 1 g/50 mL premix    Comments:  Send with pt to OR   1 g 100 mL/hr over 30 Minutes Intravenous To [facility] 08/21/14 0837 08/21/14 1500         HPI/Subjective: mildly confused but will wake up. No chest pain or shortness of  breath  Objective: Filed Vitals:   08/31/14 1811 08/31/14 2138 09/01/14 0743 09/01/14 1034  BP: 150/98 161/109 170/115 159/101  Pulse: 81 91 96   Temp: 97.9 F (36.6 C) 98.2 F (36.8 C)    TempSrc: Oral Axillary    Resp: 22     Height:      Weight: 80 kg (176 lb 5.9 oz)     SpO2: 100% 100%      Intake/Output Summary (Last 24 hours) at 09/01/14 1052 Last data filed at 09/01/14 0800  Gross per 24 hour  Intake    360 ml  Output   5000 ml  Net  -4640 ml    Exam:  *General: Somnolent but able to follow commands Integument: Warm & dry. No  rash on exposed skin. HEENT: Moist mucus membranes. No oral ulcers. No scleral injection. Cardiovascular: Regular rate. Anasarca resolved. No JVD. Pulmonary: Improving bilateral breath sounds. Symmetric chest wall rise. Normal work of breathing on nasal cannula oxygen. Abdomen: Soft. Normal bowel sounds. Nondistended.  Neurological: Patient grossly nonfocal. Moving all 4 extremities equally. Patient is aware of the president but not the year or season. Non-combative.  Data Review   Micro Results Recent Results (from the past 240 hour(s))  Culture, respiratory (NON-Expectorated)     Status: None   Collection Time: 08/24/14  6:52 PM  Result Value Ref Range Status   Specimen Description TRACHEAL ASPIRATE  Final   Special Requests Normal  Final   Gram Stain   Final    MODERATE WBC PRESENT, PREDOMINANTLY PMN NO SQUAMOUS EPITHELIAL CELLS SEEN FEW GRAM POSITIVE RODS Performed at [facility]    Culture   Final    MODERATE CANDIDA ALBICANS Performed at [facility]    Report Status 08/27/2014 FINAL  Final    Radiology Reports Dg Chest 2 View  08/20/2014   CLINICAL DATA:  Lower extremity edema, CHF and anasarca. History of renal failure.  EXAM: CHEST  2 VIEW  COMPARISON:  01/14/2014 and 06/20/2008 chest radiographs  FINDINGS: Small-moderate bilateral pleural effusions and bibasilar atelectasis again noted.  Mild  pulmonary vascular congestion is present.  The visualized cardiomediastinal silhouette is unchanged.  There is no evidence of pneumothorax.  No acute bony abnormalities are identified.  IMPRESSION: Small-moderate bilateral pleural effusions with bibasilar atelectasis.  Mild pulmonary vascular congestion.   Electronically Signed   By: Margarette Canada M.D.   On: 08/20/2014 15:01   Ct Head Wo Contrast  08/29/2014   CLINICAL DATA:  Trauma. Unwitnessed fall. Confusion and agitation today. Initial encounter.  EXAM: CT HEAD WITHOUT CONTRAST  CT CERVICAL SPINE WITHOUT CONTRAST  TECHNIQUE: Multidetector CT imaging of the head and cervical spine was performed following the standard protocol without intravenous contrast. Multiplanar CT image reconstructions of the cervical spine were also generated.  COMPARISON:  None.  FINDINGS: CT HEAD FINDINGS  Study is mildly motion degraded. Punctate hyperdense focus near the foramen of Magendie is consistent with calcification rather than hemorrhage. There is no evidence of acute intracranial hemorrhage. Ventricles and sulci are normal in size for age. There is no evidence of acute large territory infarct, mass, midline shift, or extra-axial fluid collection. Periventricular white-matter hypodensities are nonspecific but compatible with mild chronic small vessel ischemic disease.  Orbits are unremarkable. The visualized mastoid air cells are clear. There is mild right frontal and bilateral ethmoid sinus mucosal thickening. No skull fracture is identified.  CT CERVICAL SPINE FINDINGS  There is mild motion artifact through the mid to lower cervical spine despite repeating the study. There is straightening of the normal cervical lordosis. There is no listhesis. Moderate to severe disc space narrowing is present from C4-5 to C6-7 with associated degenerative endplate changes. No cervical spine fracture is identified. Moderate spinal stenosis is suspected at C4-5 and C5-6 due to broad-based  posterior disc osteophyte complexes, and there is moderate to severe neural foraminal stenosis on the left at C4-5 and on the right at C5-6.  Anasarca is noted. A right jugular central venous catheter is partially visualized. A 2.5 cm hypoattenuating nodule is noted in the right thyroid lobe. Bilateral pleural effusions are partially visualized. Skin staples are noted in the right lower neck.  IMPRESSION: 1. Mildly motion degraded examination. 2. No evidence of  acute intracranial abnormality. 3. No acute osseous abnormality identified in the cervical spine. Multilevel cervical disc degeneration. 4. Anasarca and bilateral pleural effusions, incompletely visualized. 5. 2.5 cm right thyroid nodule. Further evaluation with outpatient thyroid ultrasound is suggested.   Electronically Signed   By: Logan Bores   On: 08/29/2014 15:32   Ct Cervical Spine Wo Contrast  08/29/2014   CLINICAL DATA:  Trauma. Unwitnessed fall. Confusion and agitation today. Initial encounter.  EXAM: CT HEAD WITHOUT CONTRAST  CT CERVICAL SPINE WITHOUT CONTRAST  TECHNIQUE: Multidetector CT imaging of the head and cervical spine was performed following the standard protocol without intravenous contrast. Multiplanar CT image reconstructions of the cervical spine were also generated.  COMPARISON:  None.  FINDINGS: CT HEAD FINDINGS  Study is mildly motion degraded. Punctate hyperdense focus near the foramen of Magendie is consistent with calcification rather than hemorrhage. There is no evidence of acute intracranial hemorrhage. Ventricles and sulci are normal in size for age. There is no evidence of acute large territory infarct, mass, midline shift, or extra-axial fluid collection. Periventricular white-matter hypodensities are nonspecific but compatible with mild chronic small vessel ischemic disease.  Orbits are unremarkable. The visualized mastoid air cells are clear. There is mild right frontal and bilateral ethmoid sinus mucosal thickening. No  skull fracture is identified.  CT CERVICAL SPINE FINDINGS  There is mild motion artifact through the mid to lower cervical spine despite repeating the study. There is straightening of the normal cervical lordosis. There is no listhesis. Moderate to severe disc space narrowing is present from C4-5 to C6-7 with associated degenerative endplate changes. No cervical spine fracture is identified. Moderate spinal stenosis is suspected at C4-5 and C5-6 due to broad-based posterior disc osteophyte complexes, and there is moderate to severe neural foraminal stenosis on the left at C4-5 and on the right at C5-6.  Anasarca is noted. A right jugular central venous catheter is partially visualized. A 2.5 cm hypoattenuating nodule is noted in the right thyroid lobe. Bilateral pleural effusions are partially visualized. Skin staples are noted in the right lower neck.  IMPRESSION: 1. Mildly motion degraded examination. 2. No evidence of acute intracranial abnormality. 3. No acute osseous abnormality identified in the cervical spine. Multilevel cervical disc degeneration. 4. Anasarca and bilateral pleural effusions, incompletely visualized. 5. 2.5 cm right thyroid nodule. Further evaluation with outpatient thyroid ultrasound is suggested.   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  PROCEDURE: An ultrasound guided paracentesis was thoroughly discussed with the patient and questions answered. The benefits, risks, alternatives and complications were also discussed. The patient understands and wishes to proceed with the procedure. Written consent was obtained.  Ultrasound was performed to localize and mark an adequate pocket of fluid in the left lower quadrant of the abdomen. The area was then  prepped and draped in the normal sterile fashion. 1% Lidocaine was used for local anesthesia. Under ultrasound guidance a 19 gauge Yueh catheter was introduced. Paracentesis was performed. The catheter was removed and a dressing applied.  COMPLICATIONS: None.  FINDINGS: A total of approximately 2.5 liters of clear yellow fluid was removed. A fluid sample was sent for laboratory analysis.  IMPRESSION: Successful ultrasound guided paracentesis yielding 2.5 liters of ascites.  Read by:  Gareth Eagle, PA-C   Electronically Signed   By: Sandi Mariscal M.D.   On: 08/21/2014 14:24   Dg Chest Port 1 View  08/26/2014   CLINICAL DATA:  CHF, acute respiratory failure, anasarca  EXAM: PORTABLE CHEST - 1 VIEW  COMPARISON:  Portable chest x-ray of August 25, 2014  FINDINGS: The lungs remain hypoinflated. There are persistent bilateral pleural effusions as well bibasilar atelectasis or infiltrate. There is no pneumothorax. The cardiac silhouette is largely obscured. The central pulmonary vascularity is more distinct today. The endotracheal tube tip lies 2 cm above the carina. The esophagogastric tube tip projects below the inferior margin of the image. The left internal jugular venous catheter tip projects over the midportion of the SVC. The large caliber dual-lumen right internal jugular catheter projects over lower third of the SVC.  IMPRESSION: Slight interval improvement in the appearance of the will pulmonary interstitium and central pulmonary vascularity may reflect improving pulmonary edema. Bilateral pleural effusions and bibasilar atelectasis or infiltrate persists. The support tubes are in reasonable position.   Electronically Signed   By: David  Martinique M.D.   On: 08/26/2014 07:12   Dg Chest Port 1 View  08/25/2014   CLINICAL DATA:  Cardiac arrest  EXAM: PORTABLE CHEST - 1 VIEW  COMPARISON:  Radiograph 08/24/2014  FINDINGS: Endotracheal tube is approximately 1 cm from the carina. LEFT central venous line and NG tube are  unchanged. There are low lung volumes and basilar atelectasis. Small effusions noted. Central venous congestion present. No pneumothorax.  IMPRESSION: 1. Endotracheal tube is relatively low approximately 1 cm from carina. No significant change. 2. Low lung volumes, bibasilar atelectasis and small effusions.   Electronically Signed   By: Suzy Bouchard M.D.   On: 08/25/2014 09:09   Dg Chest Port 1 View  08/24/2014   CLINICAL DATA:  Acute respiratory failure, hypoxia. Exchange of endotracheal tube.  EXAM: PORTABLE CHEST - 1 VIEW  COMPARISON:  08/24/2014  FINDINGS: Endotracheal tube is 2.5 cm above the carina. Left central line and right dialysis catheter is well is NG tube are unchanged.  Bilateral perihilar and lower lobe opacities with layering effusions, unchanged.  IMPRESSION: Endotracheal tube 2.5 cm above the carina. Otherwise no significant change.   Electronically Signed   By: Rolm Baptise M.D.   On: 08/24/2014 18:29   Dg Chest Port 1 View  08/24/2014   CLINICAL DATA:  Respiratory failure  EXAM: PORTABLE CHEST - 1 VIEW  COMPARISON:  08/23/2014  FINDINGS: Layering moderate right pleural effusion. Small left pleural effusion.  Associated bilateral lower lobe opacities, likely atelectasis.  Pulmonary vascular congestion without frank interstitial edema.  Endotracheal tube terminates 5.5 cm above the carina.  The heart is normal in size.  Right IJ dual lumen dialysis catheter with its tip in the right atrium. Left IJ venous catheter with its tip in the mid SVC.  Enteric tube courses below the diaphragm.  IMPRESSION: Pulmonary vascular congestion without frank interstitial edema.  Layering moderate right pleural effusion. Small left pleural effusion.  Associated bilateral lower lobe opacities, likely atelectasis.  Endotracheal tube terminates 5.5 cm above the carina. Additional support apparatus as above.   Electronically Signed   By: Julian Hy M.D.   On: 08/24/2014 08:17   Dg Chest Port 1  View  08/23/2014   CLINICAL DATA:  Respiratory difficulty  EXAM: PORTABLE CHEST - 1 VIEW  COMPARISON:  08/21/2014  FINDINGS: Tubular devices are stable. Normal heart size. Bilateral pleural effusions are  stable. Central airspace disease is stable. No pneumothorax.  IMPRESSION: Stable bilateral pleural effusions and bilateral central airspace disease.   Electronically Signed   By: Marybelle Killings M.D.   On: 08/23/2014 07:44   Dg Chest Port 1 View  08/21/2014   CLINICAL DATA:  Hypoxia  EXAM: PORTABLE CHEST - 1 VIEW  COMPARISON:  August 20, 2014  FINDINGS: Endotracheal tube tip is 2.9 cm above the carina. Left jugular catheter tip is in the superior vena cava just beyond the junction with the left innominate vein. Dual-lumen catheter tip is in the right atrium. Nasogastric tube tip and side port are below the diaphragm. No pneumothorax. There is cardiomegaly with bilateral effusions and alveolar edema bilaterally. No adenopathy.  IMPRESSION: Congestive heart failure. Tube and catheter positions as described without pneumothorax.   Electronically Signed   By: Lowella Grip III M.D.   On: 08/21/2014 18:41   Dg Abd Portable 1v  08/25/2014   CLINICAL DATA:  Ileus.  EXAM: PORTABLE ABDOMEN - 1 VIEW  COMPARISON:  08/21/2014  FINDINGS: NG tube remains in the stomach. There is a nonobstructive bowel gas pattern. No gaseous distention to suggest ileus. No free air or visible organomegaly or suspicious calcification. No acute bony abnormality.  IMPRESSION: No evidence of obstruction or ileus.   Electronically Signed   By: Rolm Baptise M.D.   On: 08/25/2014 12:43   Dg Abd Portable 1v  08/21/2014   CLINICAL DATA:  OG tube placement  EXAM: PORTABLE ABDOMEN - 1 VIEW  COMPARISON:  None.  FINDINGS: NG tube with tip in the gastric body. Paucity of gas in the abdomen. Bilateral pleural effusions.  IMPRESSION: NG tube with tip in the stomach.   Electronically Signed   By: Suzy Bouchard M.D.   On: 08/21/2014 18:42   Dg Fluoro  Guide Cv Line-no Report  08/21/2014   CLINICAL DATA:    FLOURO GUIDE CV LINE  Fluoroscopy was utilized by the requesting physician.  No radiographic  interpretation.      CBC  Recent Labs Lab 08/26/14 0420 08/28/14 0420 08/30/14 0220 08/31/14 0724 09/01/14 0415  WBC 7.5 8.6 11.9* 9.2 9.6  HGB 8.6* 10.7* 9.4* 8.9* 9.5*  HCT 26.3* 34.1* 29.1* 27.8* 30.1*  PLT 174 236 254 264 287  MCV 83.5 83.8 82.9 83.2 84.1  MCH 27.3 26.3 26.8 26.6 26.5  MCHC 32.7 31.4 32.3 32.0 31.6  RDW 19.3* 19.6* 19.5* 19.4* 19.9*  LYMPHSABS 0.8  --  0.9 0.9 0.8  MONOABS 0.6  --  1.2* 0.8 1.0  EOSABS 0.1  --  0.0 0.1 0.0  BASOSABS 0.0  --  0.0 0.0 0.0    Chemistries   Recent Labs Lab 08/26/14 0420  08/28/14 0420 08/28/14 1700 08/29/14 0430 08/30/14 0220 08/31/14 0724 09/01/14 0415  NA 136  < > 137 138 138 140 139 139  K 4.0  < > 4.1 4.0 4.1 4.3 3.8 3.7  CL 105  < > 103 105 104 103 103 102  CO2 26  < > 27 27 25 23 22 26   GLUCOSE 122*  < > 157* 85 101* 78 88 99  BUN 51*  < > 41* 44* 53* 32* 48* 26*  CREATININE 4.44*  < > 2.52* 2.49* 3.03* 2.76* 4.12* 3.20*  CALCIUM 7.2*  < > 8.0* 8.2* 8.2* 8.2* 8.4* 8.4*  MG 2.0  < > 2.4  --  2.3 2.2 2.3 2.2  AST 20  --   --   --   --   --   --   --  ALT 14*  --   --   --   --   --   --   --   ALKPHOS 77  --   --   --   --   --   --   --   BILITOT 0.4  --   --   --   --   --   --   --   < > = values in this interval not displayed. ------------------------------------------------------------------------------------------------------------------ estimated creatinine clearance is 27.3 mL/min (by C-G formula based on Cr of 3.2). ------------------------------------------------------------------------------------------------------------------ No results for input(s): HGBA1C in the last 72 hours. ------------------------------------------------------------------------------------------------------------------ No results for input(s): CHOL, HDL, LDLCALC, TRIG,  CHOLHDL, LDLDIRECT in the last 72 hours. ------------------------------------------------------------------------------------------------------------------ No results for input(s): TSH, T4TOTAL, T3FREE, THYROIDAB in the last 72 hours.  Invalid input(s): FREET3 ------------------------------------------------------------------------------------------------------------------ No results for input(s): VITAMINB12, FOLATE, FERRITIN, TIBC, IRON, RETICCTPCT in the last 72 hours.  Coagulation profile No results for input(s): INR, PROTIME in the last 168 hours.  No results for input(s): DDIMER in the last 72 hours.  Cardiac Enzymes No results for input(s): CKMB, TROPONINI, MYOGLOBIN in the last 168 hours.  Invalid input(s): CK ------------------------------------------------------------------------------------------------------------------ Invalid input(s): POCBNP   CBG:  Recent Labs Lab 08/30/14 2055 08/31/14 0746 08/31/14 1153 08/31/14 2135 09/01/14 0734  GLUCAP 96 93 140* 114* 119*       Studies: No results found.    Lab Results  Component Value Date   HGBA1C 7.6* 08/20/2014   HGBA1C 6.5* 01/13/2014   Lab Results  Component Value Date   CREATININE 3.20* 09/01/2014       Scheduled Meds: . antiseptic oral rinse  7 mL Mouth Rinse BID  . calcitRIOL  0.25 mcg Oral Daily  . carvedilol  12.5 mg Oral BID WC  . [START ON 09/03/2014] darbepoetin (ARANESP) injection - DIALYSIS  150 mcg Intravenous Q Tue-HD  . heparin  5,000 Units Subcutaneous 3 times per day  . hydrALAZINE  25 mg Oral QID  . insulin aspart  0-20 Units Subcutaneous TID AC & HS  . sodium chloride  10-40 mL Intracatheter Q12H   Continuous Infusions: . sodium chloride Stopped (08/24/14 0300)  . sodium chloride      Principal Problem:   Anasarca Active Problems:   Acute on chronic combined systolic and diastolic CHF, NYHA class 2   CKD (chronic kidney disease) stage 5, GFR less than 15 ml/min   Benign  essential HTN   DM (diabetes mellitus), type 2, uncontrolled, with renal complications   Troponin level elevated   Anemia of chronic disease   Cardiorenal syndrome with renal failure   Ascites   Acute respiratory failure, unspecified whether with hypoxia or hypercapnia   Cardiac arrest   Pressure ulcer   Acute respiratory failure with hypoxia    Time spent: 45 minutes   [facility] Pager [phone]. If 7PM-7AM, please contact night-coverage at [email or web address], password [facility] 09/01/2014, 10:52 AM  LOS: 12 days

## 2014-09-02 DIAGNOSIS — I4729 Other ventricular tachycardia: Secondary | ICD-10-CM

## 2014-09-02 DIAGNOSIS — R5381 Other malaise: Secondary | ICD-10-CM | POA: Diagnosis present

## 2014-09-02 DIAGNOSIS — I472 Ventricular tachycardia: Secondary | ICD-10-CM

## 2014-09-02 HISTORY — DX: Ventricular tachycardia: I47.2

## 2014-09-02 HISTORY — DX: Other ventricular tachycardia: I47.29

## 2014-09-02 LAB — CBC WITH DIFFERENTIAL/PLATELET
BASOS ABS: 0 10*3/uL (ref 0.0–0.1)
BASOS PCT: 0 % (ref 0–1)
Eosinophils Absolute: 0.1 10*3/uL (ref 0.0–0.7)
Eosinophils Relative: 1 % (ref 0–5)
HCT: 29.3 % — ABNORMAL LOW (ref 39.0–52.0)
Hemoglobin: 9.5 g/dL — ABNORMAL LOW (ref 13.0–17.0)
Lymphocytes Relative: 11 % — ABNORMAL LOW (ref 12–46)
Lymphs Abs: 1.1 10*3/uL (ref 0.7–4.0)
MCH: 27.5 pg (ref 26.0–34.0)
MCHC: 32.4 g/dL (ref 30.0–36.0)
MCV: 84.9 fL (ref 78.0–100.0)
Monocytes Absolute: 1 10*3/uL (ref 0.1–1.0)
Monocytes Relative: 10 % (ref 3–12)
Neutro Abs: 7.3 10*3/uL (ref 1.7–7.7)
Neutrophils Relative %: 78 % — ABNORMAL HIGH (ref 43–77)
PLATELETS: 288 10*3/uL (ref 150–400)
RBC: 3.45 MIL/uL — AB (ref 4.22–5.81)
RDW: 20.5 % — AB (ref 11.5–15.5)
WBC: 9.4 10*3/uL (ref 4.0–10.5)

## 2014-09-02 LAB — RENAL FUNCTION PANEL
ALBUMIN: 2.1 g/dL — AB (ref 3.5–5.0)
Anion gap: 11 (ref 5–15)
BUN: 39 mg/dL — AB (ref 6–20)
CHLORIDE: 104 mmol/L (ref 101–111)
CO2: 24 mmol/L (ref 22–32)
Calcium: 8.2 mg/dL — ABNORMAL LOW (ref 8.9–10.3)
Creatinine, Ser: 4.19 mg/dL — ABNORMAL HIGH (ref 0.61–1.24)
GFR, EST AFRICAN AMERICAN: 16 mL/min — AB (ref >60)
GFR, EST NON AFRICAN AMERICAN: 14 mL/min — AB (ref >60)
Glucose, Bld: 73 mg/dL (ref 65–99)
PHOSPHORUS: 3.3 mg/dL (ref 2.5–4.6)
Potassium: 3.9 mmol/L (ref 3.5–5.1)
Sodium: 139 mmol/L (ref 135–145)

## 2014-09-02 LAB — GLUCOSE, CAPILLARY
GLUCOSE-CAPILLARY: 73 mg/dL (ref 65–99)
GLUCOSE-CAPILLARY: 171 mg/dL — AB (ref 65–99)
GLUCOSE-CAPILLARY: 162 mg/dL — AB (ref 65–99)
Glucose-Capillary: 172 mg/dL — ABNORMAL HIGH (ref 65–99)
Glucose-Capillary: 147 mg/dL — ABNORMAL HIGH (ref 65–99)

## 2014-09-02 LAB — MAGNESIUM: Magnesium: 2.1 mg/dL (ref 1.7–2.4)

## 2014-09-02 MED ORDER — RENA-VITE PO TABS
1.0000 | ORAL_TABLET | Freq: Every day | ORAL | Status: DC
  Administered 2014-09-02 – 2014-09-06 (×5): 1 via ORAL
  Filled 2014-09-02 (×5): qty 1

## 2014-09-02 MED ORDER — DOXERCALCIFEROL 4 MCG/2ML IV SOLN
2.0000 ug | INTRAVENOUS | Status: DC
  Administered 2014-09-03 – 2014-09-07 (×3): 2 ug via INTRAVENOUS
  Filled 2014-09-02 (×3): qty 2

## 2014-09-02 NOTE — Progress Notes (Signed)
S: Denies any new CO.  Says he is up walking around O:BP 126/87 mmHg  Pulse 87  Temp(Src) 98.7 F (37.1 C) (Oral)  Resp 19  Ht 6' (1.829 m)  Wt 78.9 kg (173 lb 15.1 oz)  BMI 23.59 kg/m2  SpO2 99%  Intake/Output Summary (Last 24 hours) at 09/02/14 0736 Last data filed at 09/01/14 2200  Gross per 24 hour  Intake    300 ml  Output      0 ml  Net    300 ml   Weight change: -1.1 kg (-2 lb 6.8 oz) AY:[id number] and alert [facility]:RRR Resp:Decreased BS bases Abd:+ BS NTND Ext: No edema NEURO: CNI Ox3, no asterixis Rt IJ Permcath   . antiseptic oral rinse  7 mL Mouth Rinse BID  . calcitRIOL  0.25 mcg Oral Daily  . carvedilol  12.5 mg Oral BID WC  . [START ON 09/03/2014] darbepoetin (ARANESP) injection - DIALYSIS  150 mcg Intravenous Q Tue-HD  . heparin  5,000 Units Subcutaneous 3 times per day  . hydrALAZINE  25 mg Oral QID  . insulin aspart  0-20 Units Subcutaneous TID AC & HS  . sodium chloride  10-40 mL Intracatheter Q12H   No results found. BMET    Component Value Date/Time   NA 139 09/02/2014 0426   K 3.9 09/02/2014 0426   CL 104 09/02/2014 0426   CO2 24 09/02/2014 0426   GLUCOSE 73 09/02/2014 0426   BUN 39* 09/02/2014 0426   CREATININE 4.19* 09/02/2014 0426   CALCIUM 8.2* 09/02/2014 0426   GFRNONAA 14* 09/02/2014 0426   GFRAA 16* 09/02/2014 0426   CBC    Component Value Date/Time   WBC 9.4 09/02/2014 0426   RBC 3.45* 09/02/2014 0426   RBC 3.59* 01/15/2014 0415   HGB 9.5* 09/02/2014 0426   HCT 29.3* 09/02/2014 0426   PLT 288 09/02/2014 0426   MCV 84.9 09/02/2014 0426   MCH 27.5 09/02/2014 0426   MCHC 32.4 09/02/2014 0426   RDW 20.5* 09/02/2014 0426   LYMPHSABS 1.1 09/02/2014 0426   MONOABS 1.0 09/02/2014 0426   EOSABS 0.1 09/02/2014 0426   BASOSABS 0.0 09/02/2014 0426     Assessment:  1. New ESRD 2. Anemia on aranesp 3. Sec HPTH   Plan: 1. HD tomorrow 2. Awaiting Outpt spot 3. Start renavite 4. DC calcitriol and start hectorol 5. Will ask [facility] to  revisit access as he is much more stable   Julionna Marczak T

## 2014-09-02 NOTE — Progress Notes (Signed)
Received prescreen request for inpatient rehab and noted rehab consult has been ordered. An admission coordinator will follow up after consult is completed. Thanks.  Nanetta Batty, PT Rehabilitation Admissions Coordinator [phone]

## 2014-09-02 NOTE — Progress Notes (Addendum)
[facility] PROGRESS NOTE  Kyle [name] [id number] DOB: [birth year] DOA: 08/20/2014 PCP: No primary care provider on file.     PULMONARY / CRITICAL CARE transfer to [facility] service /3W on 8/6 Assessment/Plan: Principal Problem:   Anasarca Active Problems:   Acute on chronic combined systolic and diastolic CHF, NYHA class 2   CKD (chronic kidney disease) stage 5, GFR less than 15 ml/min   Benign essential HTN   DM (diabetes mellitus), type 2, uncontrolled, with renal complications   Troponin level elevated   Anemia of chronic disease   Cardiorenal syndrome with renal failure   Ascites   Acute respiratory failure, unspecified whether with hypoxia or hypercapnia   Cardiac arrest   Pressure ulcer   Acute respiratory failure with hypoxia    PEA cardiac arrest. Hx of HTN. Cardiology following, continue Coreg twice a day and diuresis with hemodialysis Can the patient have an EP consult as an inpatient? Cardia cath timing to be determined by cardiology?  Prior data cardiac cardiac arrest with Arctic sign improving-supposed to have EP consult as an outpatient   Possible Aspiration Pneumonia - completing 7 day course of tx  Acute respiratory failure after cardiac arrest.pleural effusion rt?, pulm edema, gross overlaod  Currently on room air  End-stage renal disease- on CRRT from 7/27-8/3, now on hemodialysis, Had IHD 8/4- And on 8/6 Will need OP HD  and perm access   prior to discharge- . [facility]VS has seen but wanted to wait for him to be more stable.VVS to revisit access as he is much more stable.  Anticipate future hemodialysis Tuesday Thursday Saturday  Nausea/diarrhea-if recurs will obtain abdominal KUB, rule out C. difficile  Diabetes mellitus-continue SSI,Accuchecks to qAC & HS  Acute encephalopathy after cardiac arrest - Improving,patient did suffer an unwitnessed fall on 8/4. CT scan of the head/neck was negative for any fracture or obvious trauma. Incidentally  found thyroid nodule on CT scan of the neck will need to be followed up as an outpatient.  Anemia of chronic disease- nephrology added aranesp  Cardiomyopathy - acute on chronic systolic heart failure - EF 10%,-  continue Coreg 12 BID (was on Coreg previously). Goal 25 BID. Started on hydralazine by cardiology  - no ACE-I secondary to renal function -Cardiology would like EP to see him as outpatient . May need cath in future to exclude CAD. More likely hypertensive cardiomyopathy     Code Status:      Code Status Orders        Start     Ordered   08/21/14 1649  Full code   Continuous     08/21/14 1652     Family Communication: family updated about patient's clinical progress Disposition Plan:  Physical therapy recommends CIR   Brief narrative: 59 yo male with PMHx of CKD Stage V (non-compliant with f/u), chronic combined CHF (EF 15%), HTN and T2DM initially admitted 7/26 to [facility] with acute on chronic CHF and acute on CKD with SCr 10.7. Pt was tx to [facility] for placement of tunneled HD cath for initiation of dialysis. He went to OR 7/27 for placement of Tunneled cath (concious sedation only) and intra-operatively (post catheter placement) had bradycardia and ultimately PEA arrest with approx 10 mins CPR before ROSC. No purposeful mental status post arrest. Tx to ICU and PCCM consulted.       Consultants:  Critical care  Nephrology  Cardiology  Vascular surgery    Procedures: STUDIES:  7/27 paracentesis 7/28 Echo >> EF 10% 8/04 CT Head/Neck - negative for fracture or trauma  SIGNIFICANT EVENTS: 7/27 Cardiac arrest, intubated and to ICU, hypothermia 7/29 rewarmed 7/30 more agitated 8/03 extubated 8/04 unwitnessed fall  Antibiotics: Anti-infectives    Start     Dose/Rate Route Frequency Ordered Stop   08/29/14 1000  piperacillin-tazobactam (ZOSYN) IVPB 2.25 g     2.25 g 100 mL/hr over 30 Minutes Intravenous 3 times per day 08/29/14 0855 08/31/14  0017   08/28/14 1200  piperacillin-tazobactam (ZOSYN) IVPB 3.375 g  Status:  Discontinued     3.375 g 100 mL/hr over 30 Minutes Intravenous 4 times per day 08/28/14 0905 08/28/14 0906   08/28/14 1100  piperacillin-tazobactam (ZOSYN) IVPB 3.375 g  Status:  Discontinued     3.375 g 100 mL/hr over 30 Minutes Intravenous Every 6 hours 08/28/14 0906 08/29/14 0855   08/24/14 1900  vancomycin (VANCOCIN) 1,500 mg in sodium chloride 0.9 % 500 mL IVPB  Status:  Discontinued     1,500 mg 250 mL/hr over 120 Minutes Intravenous Every 48 hours 08/22/14 1830 08/23/14 1332   08/24/14 1800  piperacillin-tazobactam (ZOSYN) IVPB 3.375 g  Status:  Discontinued     3.375 g 12.5 mL/hr over 240 Minutes Intravenous 4 times per day 08/24/14 1715 08/24/14 1719   08/24/14 1800  piperacillin-tazobactam (ZOSYN) IVPB 3.375 g  Status:  Discontinued     3.375 g 100 mL/hr over 30 Minutes Intravenous 4 times per day 08/24/14 1720 08/28/14 0905   08/24/14 1600  nafcillin 1 g in dextrose 5 % 50 mL IVPB  Status:  Discontinued     1 g 100 mL/hr over 30 Minutes Intravenous 6 times per day 08/24/14 1439 08/24/14 1715   08/24/14 1530  nafcillin injection 1 g  Status:  Discontinued     1 g Intravenous Every 4 hours 08/24/14 1428 08/24/14 1439   08/23/14 2100  vancomycin (VANCOCIN) IVPB 1000 mg/200 mL premix  Status:  Discontinued     1,000 mg 200 mL/hr over 60 Minutes Intravenous Every 24 hours 08/23/14 1340 08/24/14 1428   08/23/14 2000  vancomycin (VANCOCIN) IVPB 1000 mg/200 mL premix  Status:  Discontinued     1,000 mg 200 mL/hr over 60 Minutes Intravenous Every 24 hours 08/23/14 1333 08/23/14 1340   08/22/14 1900  vancomycin (VANCOCIN) 1,750 mg in sodium chloride 0.9 % 500 mL IVPB     1,750 mg 250 mL/hr over 120 Minutes Intravenous  Once 08/22/14 1826 08/22/14 2142   08/21/14 1353  ceFAZolin (ANCEF) IVPB 1 g/50 mL premix    Comments:  Send with pt to OR   1 g 100 mL/hr over 30 Minutes Intravenous To [facility]  08/21/14 0837 08/21/14 1500         HPI/Subjective: mildly confused but will wake up. No chest pain or shortness of breath  Objective: Filed Vitals:   09/01/14 2151 09/02/14 0000 09/02/14 0400 09/02/14 0803  BP: 142/95 151/92 126/87 136/82  Pulse: 77 80 87 79  Temp: 98.1 F (36.7 C) 97.5 F (36.4 C) 98.7 F (37.1 C) 97.9 F (36.6 C)  TempSrc: Oral Oral Oral Oral  Resp:  20 19 20   Height:      Weight:   78.9 kg (173 lb 15.1 oz)   SpO2: 100% 100% 99% 100%    Intake/Output Summary (Last 24 hours) at 09/02/14 1147 Last data filed at 09/02/14 1010  Gross per 24 hour  Intake    540 ml  Output      0 ml  Net    540 ml    Exam:  PE: General:Pleasant affect, NAD Skin:Warm and dry, brisk capillary refill HEENT:normocephalic, sclera clear, mucus membranes moist Heart:S1S2 RRR without murmur, gallup, rub or click Lungs:clear without rales, rhonchi, or wheezes [id number], non tender, + BS, do not palpate liver spleen or masses Ext:no lower ext edema, 2+ pedal pulses, 2+ radial pulses Neuro:alert and oriented X 3, MAE, follows commands, + facial symmetry Tele: SR with PVCs  Data Review   Micro Results Recent Results (from the past 240 hour(s))  Culture, respiratory (NON-Expectorated)     Status: None   Collection Time: 08/24/14  6:52 PM  Result Value Ref Range Status   Specimen Description TRACHEAL ASPIRATE  Final   Special Requests Normal  Final   Gram Stain   Final    MODERATE WBC PRESENT, PREDOMINANTLY PMN NO SQUAMOUS EPITHELIAL CELLS SEEN FEW GRAM POSITIVE RODS Performed at [facility]    Culture   Final    MODERATE CANDIDA ALBICANS Performed at [facility]    Report Status 08/27/2014 FINAL  Final    Radiology Reports Dg Chest 2 View  08/20/2014   CLINICAL DATA:  Lower extremity edema, CHF and anasarca. History of renal failure.  EXAM: CHEST  2 VIEW  COMPARISON:  01/14/2014 and 06/20/2008 chest radiographs  FINDINGS: Small-moderate  bilateral pleural effusions and bibasilar atelectasis again noted.  Mild pulmonary vascular congestion is present.  The visualized cardiomediastinal silhouette is unchanged.  There is no evidence of pneumothorax.  No acute bony abnormalities are identified.  IMPRESSION: Small-moderate bilateral pleural effusions with bibasilar atelectasis.  Mild pulmonary vascular congestion.   Electronically Signed   By: Margarette Canada M.D.   On: 08/20/2014 15:01   Ct Head Wo Contrast  08/29/2014   CLINICAL DATA:  Trauma. Unwitnessed fall. Confusion and agitation today. Initial encounter.  EXAM: CT HEAD WITHOUT CONTRAST  CT CERVICAL SPINE WITHOUT CONTRAST  TECHNIQUE: Multidetector CT imaging of the head and cervical spine was performed following the standard protocol without intravenous contrast. Multiplanar CT image reconstructions of the cervical spine were also generated.  COMPARISON:  None.  FINDINGS: CT HEAD FINDINGS  Study is mildly motion degraded. Punctate hyperdense focus near the foramen of Magendie is consistent with calcification rather than hemorrhage. There is no evidence of acute intracranial hemorrhage. Ventricles and sulci are normal in size for age. There is no evidence of acute large territory infarct, mass, midline shift, or extra-axial fluid collection. Periventricular white-matter hypodensities are nonspecific but compatible with mild chronic small vessel ischemic disease.  Orbits are unremarkable. The visualized mastoid air cells are clear. There is mild right frontal and bilateral ethmoid sinus mucosal thickening. No skull fracture is identified.  CT CERVICAL SPINE FINDINGS  There is mild motion artifact through the mid to lower cervical spine despite repeating the study. There is straightening of the normal cervical lordosis. There is no listhesis. Moderate to severe disc space narrowing is present from C4-5 to C6-7 with associated degenerative endplate changes. No cervical spine fracture is identified.  Moderate spinal stenosis is suspected at C4-5 and C5-6 due to broad-based posterior disc osteophyte complexes, and there is moderate to severe neural foraminal stenosis on the left at C4-5 and on the right at C5-6.  Anasarca is noted. A right jugular central venous catheter is partially visualized. A 2.5 cm hypoattenuating nodule is noted in the right thyroid lobe. Bilateral pleural effusions are partially visualized.  Skin staples are noted in the right lower neck.  IMPRESSION: 1. Mildly motion degraded examination. 2. No evidence of acute intracranial abnormality. 3. No acute osseous abnormality identified in the cervical spine. Multilevel cervical disc degeneration. 4. Anasarca and bilateral pleural effusions, incompletely visualized. 5. 2.5 cm right thyroid nodule. Further evaluation with outpatient thyroid ultrasound is suggested.   Electronically Signed   By: Logan Bores   On: 08/29/2014 15:32   Ct Cervical Spine Wo Contrast  08/29/2014   CLINICAL DATA:  Trauma. Unwitnessed fall. Confusion and agitation today. Initial encounter.  EXAM: CT HEAD WITHOUT CONTRAST  CT CERVICAL SPINE WITHOUT CONTRAST  TECHNIQUE: Multidetector CT imaging of the head and cervical spine was performed following the standard protocol without intravenous contrast. Multiplanar CT image reconstructions of the cervical spine were also generated.  COMPARISON:  None.  FINDINGS: CT HEAD FINDINGS  Study is mildly motion degraded. Punctate hyperdense focus near the foramen of Magendie is consistent with calcification rather than hemorrhage. There is no evidence of acute intracranial hemorrhage. Ventricles and sulci are normal in size for age. There is no evidence of acute large territory infarct, mass, midline shift, or extra-axial fluid collection. Periventricular white-matter hypodensities are nonspecific but compatible with mild chronic small vessel ischemic disease.  Orbits are unremarkable. The visualized mastoid air cells are clear.  There is mild right frontal and bilateral ethmoid sinus mucosal thickening. No skull fracture is identified.  CT CERVICAL SPINE FINDINGS  There is mild motion artifact through the mid to lower cervical spine despite repeating the study. There is straightening of the normal cervical lordosis. There is no listhesis. Moderate to severe disc space narrowing is present from C4-5 to C6-7 with associated degenerative endplate changes. No cervical spine fracture is identified. Moderate spinal stenosis is suspected at C4-5 and C5-6 due to broad-based posterior disc osteophyte complexes, and there is moderate to severe neural foraminal stenosis on the left at C4-5 and on the right at C5-6.  Anasarca is noted. A right jugular central venous catheter is partially visualized. A 2.5 cm hypoattenuating nodule is noted in the right thyroid lobe. Bilateral pleural effusions are partially visualized. Skin staples are noted in the right lower neck.  IMPRESSION: 1. Mildly motion degraded examination. 2. No evidence of acute intracranial abnormality. 3. No acute osseous abnormality identified in the cervical spine. Multilevel cervical disc degeneration. 4. Anasarca and bilateral pleural effusions, incompletely visualized. 5. 2.5 cm right thyroid nodule. Further evaluation with outpatient thyroid ultrasound is suggested.   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  PROCEDURE: An ultrasound guided paracentesis was thoroughly discussed with the patient and questions answered. The benefits, risks, alternatives and complications were also discussed. The patient understands and wishes to proceed with the procedure. Written consent was obtained.  Ultrasound was performed to localize and mark an adequate  pocket of fluid in the left lower quadrant of the abdomen. The area was then prepped and draped in the normal sterile fashion. 1% Lidocaine was used for local anesthesia. Under ultrasound guidance a 19 gauge Yueh catheter was introduced. Paracentesis was performed. The catheter was removed and a dressing applied.  COMPLICATIONS: None.  FINDINGS: A total of approximately 2.5 liters of clear yellow fluid was removed. A fluid sample  was sent for laboratory analysis.  IMPRESSION: Successful ultrasound guided paracentesis yielding 2.5 liters of ascites.  Read by:  Gareth Eagle, PA-C   Electronically Signed   By: Sandi Mariscal M.D.   On: 08/21/2014 14:24   Dg Chest Port 1 View  08/26/2014   CLINICAL DATA:  CHF, acute respiratory failure, anasarca  EXAM: PORTABLE CHEST - 1 VIEW  COMPARISON:  Portable chest x-ray of August 25, 2014  FINDINGS: The lungs remain hypoinflated. There are persistent bilateral pleural effusions as well bibasilar atelectasis or infiltrate. There is no pneumothorax. The cardiac silhouette is largely obscured. The central pulmonary vascularity is more distinct today. The endotracheal tube tip lies 2 cm above the carina. The esophagogastric tube tip projects below the inferior margin of the image. The left internal jugular venous catheter tip projects over the midportion of the SVC. The large caliber dual-lumen right internal jugular catheter projects over lower third of the SVC.  IMPRESSION: Slight interval improvement in the appearance of the will pulmonary interstitium and central pulmonary vascularity may reflect improving pulmonary edema. Bilateral pleural effusions and bibasilar atelectasis or infiltrate persists. The support tubes are in reasonable position.   Electronically Signed   By: David  Martinique M.D.   On: 08/26/2014 07:12   Dg Chest Port 1 View  08/25/2014   CLINICAL DATA:  Cardiac arrest  EXAM: PORTABLE CHEST - 1 VIEW  COMPARISON:  Radiograph 08/24/2014  FINDINGS: Endotracheal tube is  approximately 1 cm from the carina. LEFT central venous line and NG tube are unchanged. There are low lung volumes and basilar atelectasis. Small effusions noted. Central venous congestion present. No pneumothorax.  IMPRESSION: 1. Endotracheal tube is relatively low approximately 1 cm from carina. No significant change. 2. Low lung volumes, bibasilar atelectasis and small effusions.   Electronically Signed   By: Suzy Bouchard M.D.   On: 08/25/2014 09:09   Dg Chest Port 1 View  08/24/2014   CLINICAL DATA:  Acute respiratory failure, hypoxia. Exchange of endotracheal tube.  EXAM: PORTABLE CHEST - 1 VIEW  COMPARISON:  08/24/2014  FINDINGS: Endotracheal tube is 2.5 cm above the carina. Left central line and right dialysis catheter is well is NG tube are unchanged.  Bilateral perihilar and lower lobe opacities with layering effusions, unchanged.  IMPRESSION: Endotracheal tube 2.5 cm above the carina. Otherwise no significant change.   Electronically Signed   By: Rolm Baptise M.D.   On: 08/24/2014 18:29   Dg Chest Port 1 View  08/24/2014   CLINICAL DATA:  Respiratory failure  EXAM: PORTABLE CHEST - 1 VIEW  COMPARISON:  08/23/2014  FINDINGS: Layering moderate right pleural effusion. Small left pleural effusion.  Associated bilateral lower lobe opacities, likely atelectasis.  Pulmonary vascular congestion without frank interstitial edema.  Endotracheal tube terminates 5.5 cm above the carina.  The heart is normal in size.  Right IJ dual lumen dialysis catheter with its tip in the right atrium. Left IJ venous catheter with its tip in the mid SVC.  Enteric tube courses below the diaphragm.  IMPRESSION: Pulmonary vascular congestion without frank interstitial edema.  Layering moderate right pleural effusion. Small left pleural effusion.  Associated bilateral lower lobe opacities, likely atelectasis.  Endotracheal tube terminates 5.5 cm above the carina. Additional support apparatus as above.   Electronically Signed    By: Julian Hy M.D.   On: 08/24/2014 08:17   Dg Chest Port 1 View  08/23/2014   CLINICAL DATA:  Respiratory difficulty  EXAM: PORTABLE CHEST -  1 VIEW  COMPARISON:  08/21/2014  FINDINGS: Tubular devices are stable. Normal heart size. Bilateral pleural effusions are stable. Central airspace disease is stable. No pneumothorax.  IMPRESSION: Stable bilateral pleural effusions and bilateral central airspace disease.   Electronically Signed   By: Marybelle Killings M.D.   On: 08/23/2014 07:44   Dg Chest Port 1 View  08/21/2014   CLINICAL DATA:  Hypoxia  EXAM: PORTABLE CHEST - 1 VIEW  COMPARISON:  August 20, 2014  FINDINGS: Endotracheal tube tip is 2.9 cm above the carina. Left jugular catheter tip is in the superior vena cava just beyond the junction with the left innominate vein. Dual-lumen catheter tip is in the right atrium. Nasogastric tube tip and side port are below the diaphragm. No pneumothorax. There is cardiomegaly with bilateral effusions and alveolar edema bilaterally. No adenopathy.  IMPRESSION: Congestive heart failure. Tube and catheter positions as described without pneumothorax.   Electronically Signed   By: Lowella Grip III M.D.   On: 08/21/2014 18:41   Dg Abd Portable 1v  08/25/2014   CLINICAL DATA:  Ileus.  EXAM: PORTABLE ABDOMEN - 1 VIEW  COMPARISON:  08/21/2014  FINDINGS: NG tube remains in the stomach. There is a nonobstructive bowel gas pattern. No gaseous distention to suggest ileus. No free air or visible organomegaly or suspicious calcification. No acute bony abnormality.  IMPRESSION: No evidence of obstruction or ileus.   Electronically Signed   By: Rolm Baptise M.D.   On: 08/25/2014 12:43   Dg Abd Portable 1v  08/21/2014   CLINICAL DATA:  OG tube placement  EXAM: PORTABLE ABDOMEN - 1 VIEW  COMPARISON:  None.  FINDINGS: NG tube with tip in the gastric body. Paucity of gas in the abdomen. Bilateral pleural effusions.  IMPRESSION: NG tube with tip in the stomach.   Electronically  Signed   By: Suzy Bouchard M.D.   On: 08/21/2014 18:42   Dg Fluoro Guide Cv Line-no Report  08/21/2014   CLINICAL DATA:    FLOURO GUIDE CV LINE  Fluoroscopy was utilized by the requesting physician.  No radiographic  interpretation.      CBC  Recent Labs Lab 08/28/14 0420 08/30/14 0220 08/31/14 0724 09/01/14 0415 09/02/14 0426  WBC 8.6 11.9* 9.2 9.6 9.4  HGB 10.7* 9.4* 8.9* 9.5* 9.5*  HCT 34.1* 29.1* 27.8* 30.1* 29.3*  PLT 236 254 264 287 288  MCV 83.8 82.9 83.2 84.1 84.9  MCH 26.3 26.8 26.6 26.5 27.5  MCHC 31.4 32.3 32.0 31.6 32.4  RDW 19.6* 19.5* 19.4* 19.9* 20.5*  LYMPHSABS  --  0.9 0.9 0.8 1.1  MONOABS  --  1.2* 0.8 1.0 1.0  EOSABS  --  0.0 0.1 0.0 0.1  BASOSABS  --  0.0 0.0 0.0 0.0    Chemistries   Recent Labs Lab 08/29/14 0430 08/30/14 0220 08/31/14 0724 09/01/14 0415 09/02/14 0426  NA 138 140 139 139 139  K 4.1 4.3 3.8 3.7 3.9  CL 104 103 103 102 104  CO2 25 23 22 26 24   GLUCOSE 101* 78 88 99 73  BUN 53* 32* 48* 26* 39*  CREATININE 3.03* 2.76* 4.12* 3.20* 4.19*  CALCIUM 8.2* 8.2* 8.4* 8.4* 8.2*  MG 2.3 2.2 2.3 2.2 2.1   ------------------------------------------------------------------------------------------------------------------ estimated creatinine clearance is 20.8 mL/min (by C-G formula based on Cr of 4.19). ------------------------------------------------------------------------------------------------------------------ No results for input(s): HGBA1C in the last 72 hours. ------------------------------------------------------------------------------------------------------------------ No results for input(s): CHOL, HDL, LDLCALC, TRIG, CHOLHDL, LDLDIRECT in the last 72  hours. ------------------------------------------------------------------------------------------------------------------ No results for input(s): TSH, T4TOTAL, T3FREE, THYROIDAB in the last 72 hours.  Invalid input(s):  FREET3 ------------------------------------------------------------------------------------------------------------------ No results for input(s): VITAMINB12, FOLATE, FERRITIN, TIBC, IRON, RETICCTPCT in the last 72 hours.  Coagulation profile No results for input(s): INR, PROTIME in the last 168 hours.  No results for input(s): DDIMER in the last 72 hours.  Cardiac Enzymes No results for input(s): CKMB, TROPONINI, MYOGLOBIN in the last 168 hours.  Invalid input(s): CK ------------------------------------------------------------------------------------------------------------------ Invalid input(s): POCBNP   CBG:  Recent Labs Lab 09/01/14 1136 09/01/14 1704 09/01/14 2023 09/01/14 2148 09/02/14 0735  GLUCAP 143* 142* 122* 133* 73       Studies: No results found.    Lab Results  Component Value Date   HGBA1C 7.6* 08/20/2014   HGBA1C 6.5* 01/13/2014   Lab Results  Component Value Date   CREATININE 4.19* 09/02/2014       Scheduled Meds: . antiseptic oral rinse  7 mL Mouth Rinse BID  . carvedilol  12.5 mg Oral BID WC  . [START ON 09/03/2014] darbepoetin (ARANESP) injection - DIALYSIS  150 mcg Intravenous Q Tue-HD  . [START ON 09/03/2014] doxercalciferol  2 mcg Intravenous Q T,Th,Sa-HD  . heparin  5,000 Units Subcutaneous 3 times per day  . hydrALAZINE  25 mg Oral QID  . insulin aspart  0-20 Units Subcutaneous TID AC & HS  . multivitamin  1 tablet Oral QHS  . sodium chloride  10-40 mL Intracatheter Q12H   Continuous Infusions: . sodium chloride Stopped (08/24/14 0300)  . sodium chloride      Principal Problem:   Anasarca Active Problems:   Acute on chronic combined systolic and diastolic CHF, NYHA class 2   CKD (chronic kidney disease) stage 5, GFR less than 15 ml/min   Benign essential HTN   DM (diabetes mellitus), type 2, uncontrolled, with renal complications   Troponin level elevated   Anemia of chronic disease   Cardiorenal syndrome with renal  failure   Ascites   Acute respiratory failure, unspecified whether with hypoxia or hypercapnia   Cardiac arrest   Pressure ulcer   Acute respiratory failure with hypoxia    Time spent: 45 minutes   [facility] Pager [phone]. If 7PM-7AM, please contact night-coverage at [email or web address], password [email or web address] 09/02/2014, 11:47 AM  LOS: 13 days

## 2014-09-02 NOTE — Progress Notes (Addendum)
Speech Language Pathology Treatment: Dysphagia  Patient Details Name: Kyle [name] MRN: [id number] DOB: 1955-02-03 Today's Date: 09/02/2014 Time: [id number] SLP Time Calculation (min) (ACUTE ONLY): 20 min  Assessment / Plan / Recommendation Clinical Impression  Dysphagia treatment provided today for diet tolerance/ trials of advanced solids. Pt with significant improvements cognitively compared to visit 3 days ago- conversant and pleasant, able to follow directions with ease. Observed pt finishing meal- no overt s/s of aspiration. Trials of regular solid provided with no s/s of aspiration. No recent CXR; however, pt showed no signs of respiratory changes during meal. Reported no difficulties with meals the past several days. Aspiration risk appears mild. Recommend advancing diet to regular/ thin liquids, meds whole with liquid, intermittent supervision to ensure pt in upright position/ cue small bites/ sips. SLP will sign off at this time as pt is tolerating POs well; please re-consult if needs arise.    HPI Other Pertinent Information: Pt is a 59 y.o. male with PMH of chronic systolic and diastolic CHF (baseline EF 15% and grade 2 diastolic dysfunction from 2 D ECHO in 12/2013), hypertension, CKD stage 5, diabetes who presented to [facility] ED with worsening abdominal distention, scrotal and leg swelling over past few weeks prior to this admission. He also reported weight gain over past few months and feeling very tired and shortof breath with even minimal exertion. Initial CXR 7/26 showed small-mod bilateral pleural effusions with bibasilar atelectasis. Pt had vascular surgery 7/27 and then had bradycardia/ PEA arrest with 10 mins CPR before ROSC, no purposeful mental status post-arrest. Intubated 7/27-8/3. Reportedly neuro improvements but continued confusion. Pt had a fall on 8/4 but head CT was clear. Bedside swallow eval ordered to assess swallow function post- prolonged intubation.   Pertinent Vitals  Pain Assessment: No/denies pain  SLP Plan  All goals met    Recommendations Diet recommendations: Regular;Thin liquid Liquids provided via: Cup;Straw Medication Administration: Whole meds with liquid Supervision: Patient able to self feed Compensations: Slow rate;Small sips/bites Postural Changes and/or Swallow Maneuvers: Seated upright 90 degrees              Oral Care Recommendations: Oral care BID Follow up Recommendations: None Plan: All goals met    GO     Kern Reap, MA, CCC-SLP 09/02/2014, 9:17 AM [phone]

## 2014-09-02 NOTE — Consult Note (Signed)
Physical Medicine and Rehabilitation Consult  Reason for Consult: Debility Referring Physician: Dr. Allyson Sabal.    HPI: Kyle [name] is a 59 y.o. male with history of HTN, DM type 2, CKD stage V (non-compliance with f/u), chronic systolic CHF with EF 0000000 who was admitted on 08/20/14 with worsening of renal failure with acute exacerbation of CHF and fluid overload.  He underwent paracentesis for anasarca.  He was started on IV diuretics and dialysis catheter placed for initiation of CRRT.  Intra- operatively patient developed bradycardia with PEA and required CPR X 10 minutes before ROSC. He was intubated and treated with hypothermia protocol. Cardiology following and etiology fo PEA unclear.  To have EP evaluation as outpatient. 2D echo with diffuse hypokinesis, EF 10%, large left pleural effusion and speckled myocardium question amyloidosis v/s ESRD.  He tolerated extubation on 08/4 but has had confusion as well as unwitnessed fall. CT head negative. Has been transitioned to HD and VVS to follow up for perm cath in anticipation of HD TTS.   BP medications being adjusted for better control and plans for cardiac cath/EP study this week as mentation has improved. Diet advanced to regular, thin liquids today as patient has made significant improvements cognitively.   PT evaluation done this weekend and CIR recommended for follow up therapy.   Ambulated guard assist 250 feet with PT today  Review of Systems  HENT: Negative for hearing loss.   Eyes: Negative for blurred vision and double vision.  Respiratory: Negative for shortness of breath.   Cardiovascular: Negative for chest pain and palpitations.  Gastrointestinal: Negative for heartburn, nausea and constipation.  Genitourinary: Negative for urgency and frequency.  Musculoskeletal: Negative for myalgias, back pain and falls.  Neurological: Negative for dizziness, tingling and headaches.  Psychiatric/Behavioral: The patient has insomnia.        Past Medical History  Diagnosis Date  . Hypertension   . Diabetes mellitus without complication     Past Surgical History  Procedure Laterality Date  . External ear surgery    . Tonsillectomy    . Insertion of dialysis catheter N/A 08/21/2014    Procedure: INSERTION OF DIALYSIS CATHETER RIGHT INTERNAL JUGULAR VEIN;  Surgeon: Conrad Wilson, MD;  Location: [place];  Service: Vascular;  Laterality: N/A;  MAC to General    Family History  Problem Relation Age of Onset  . Heart failure Mother     Social History:  Married. 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 He reports that he has quit smoking 32 years ago. His smoking use included Cigarettes. He has never used smokeless tobacco. He reports that he does not drink alcohol or use illicit drugs.    Allergies: No Known Allergies    Medications Prior to Admission  Medication Sig Dispense Refill  . aspirin 81 MG chewable tablet Chew 1 tablet (81 mg total) by mouth daily.    . calcitRIOL (ROCALTROL) 0.25 MCG capsule Take 1 capsule (0.25 mcg total) by mouth every Monday, Wednesday, and Friday. 30 capsule 1  . carvedilol (COREG) 12.5 MG tablet Take 0.5 tablets (6.25 mg total) by mouth 2 (two) times daily with a meal. 60 tablet 1  . ferrous sulfate 325 (65 FE) MG tablet Take 1 tablet (325 mg total) by mouth daily with breakfast. 30 tablet 3  . furosemide (LASIX) 80 MG tablet Take 1 tablet (80 mg total) by mouth 3 (three) times daily. 90 tablet 1  . glimepiride (  AMARYL) 2 MG tablet Take 1 tablet (2 mg total) by mouth daily with breakfast. 30 tablet 1  . hydrALAZINE (APRESOLINE) 25 MG tablet Take 1.5 tablets (37.5 mg total) by mouth 3 (three) times daily. (Patient not taking: Reported on 08/20/2014) 90 tablet 0  . isosorbide dinitrate (ISORDIL) 20 MG tablet Take 1 tablet (20 mg total) by mouth 3 (three) times daily. (Patient not taking: Reported on 08/20/2014) 90 tablet 0  . isosorbide-hydrALAZINE (BIDIL) 20-37.5 MG  per tablet Take 1 tablet by mouth 3 (three) times daily. 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 Functional Status:  Mobility: Bed Mobility Overal bed mobility: Needs Assistance, +2 for physical assistance Bed Mobility: Rolling, Sidelying to Sit, Sit to Sidelying Rolling: Mod assist Sidelying to sit: Max assist, +2 for physical assistance Sit to sidelying: Max assist, +2 for physical assistance General bed mobility comments: Verbal cues for technique.  Assist to roll to left side with use of rail.  Required +2 assist to move LE's off of bed and raise trunk to upright sitting position.  Once upright, patient used UE's for support to maintain balance, along with min guard assist for safety.  Patient able to sit EOB x 6 minutes.  BP supine 150/100, and in sitting 150/96.  HR remained in 80's during session.  Returned to sidelying with assist to lower trunk and to bring LE's onto bed.  Patient fatigued quickly.        ADL:    Cognition: Cognition Overall Cognitive Status: Impaired/Different from baseline Orientation Level: Oriented to person, Oriented to place, Oriented to situation (stated year was 2006, month was august & president is obama) Cognition Arousal/Alertness: Awake/alert Behavior During Therapy: Flat affect, Impulsive Overall Cognitive Status: Impaired/Different from baseline Area of Impairment: Orientation, Safety/judgement, Awareness, Following  commands Orientation Level: Disoriented to, Situation, Time Following Commands: Follows one step commands inconsistently Safety/Judgement: Decreased awareness of deficits, Decreased awareness of safety   Blood pressure 136/82, pulse 79, temperature 97.9 F (36.6 C), temperature source Oral, resp. rate 20, height 6' (1.829 m), weight 78.9 kg (173 lb 15.1 oz), SpO2 100 %. Physical Exam  Nursing note and vitals reviewed. Constitutional: He is oriented to person, place, and time. He appears well-developed and well-nourished.  HENT:  Head: Normocephalic and atraumatic.  Eyes: Conjunctivae are normal. Pupils are equal, round, and reactive to light.  Neck: Normal range of motion. Neck supple.  Cardiovascular: Normal rate and regular rhythm.   Murmur heard. Respiratory: Effort normal and breath sounds normal. No respiratory distress. He has no wheezes.  GI: Soft. Bowel sounds are normal. He exhibits no distension. There is no tenderness.  Musculoskeletal: He exhibits no edema or tenderness.  Neurological: He is alert and oriented to person, place, and time.  Speech clear. Able to follow commands without difficulty. Asked appropriated medical questions.   Skin: Skin is warm and dry.  Psychiatric: He has a normal mood and affect. His behavior is normal. Judgment and thought content normal.  4/5 strength bilateral deltoid, biceps, triceps, grip, hip flexor, knee extensor, ankle dorsi flexor plantar flexor  Results for orders placed or performed during the hospital encounter of 08/20/14 (from the past 24 hour(s))  Glucose, capillary     Status: Abnormal   Collection Time: 09/01/14 11:36 AM  Result Value Ref Range   Glucose-Capillary 143 (H) 65 - 99 mg/dL  Glucose, capillary     Status: Abnormal   Collection Time: 09/01/14  5:04 PM  Result Value Ref Range   Glucose-Capillary 142 (H) 65 - 99 mg/dL  Glucose, capillary     Status: Abnormal   Collection Time: 09/01/14  8:23 PM  Result Value Ref  Range   Glucose-Capillary 122 (H) 65 - 99 mg/dL  Glucose, capillary     Status: Abnormal   Collection Time: 09/01/14  9:48 PM  Result Value Ref Range   Glucose-Capillary 133 (H) 65 - 99 mg/dL  Magnesium     Status: None   Collection Time: 09/02/14  4:26 AM  Result Value Ref Range   Magnesium 2.1 1.7 - 2.4 mg/dL  CBC with Differential/Platelet     Status: Abnormal   Collection Time: 09/02/14  4:26 AM  Result Value Ref Range   WBC 9.4 4.0 - 10.5 K/uL   RBC 3.45 (L) 4.22 - 5.81 MIL/uL   Hemoglobin 9.5 (L) 13.0 - 17.0 g/dL   HCT 29.3 (L) 39.0 - 52.0 %   MCV 84.9 78.0 - 100.0 fL   MCH 27.5 26.0 - 34.0 pg   MCHC 32.4 30.0 - 36.0 g/dL   RDW 20.5 (H) 11.5 - 15.5 %   Platelets 288 150 - 400 K/uL   Neutrophils Relative % 78 (H) 43 - 77 %   Neutro Abs 7.3 1.7 - 7.7 K/uL   Lymphocytes Relative 11 (L) 12 - 46 %   Lymphs Abs 1.1 0.7 - 4.0 K/uL   Monocytes Relative 10 3 - 12 %   Monocytes Absolute 1.0 0.1 - 1.0 K/uL   Eosinophils Relative 1 0 - 5 %   Eosinophils Absolute 0.1 0.0 - 0.7 K/uL   Basophils Relative 0 0 - 1 %   Basophils Absolute 0.0 0.0 - 0.1 K/uL  Renal function panel     Status: Abnormal   Collection Time: 09/02/14  4:26 AM  Result Value Ref Range   Sodium 139 135 - 145 mmol/L   Potassium 3.9 3.5 - 5.1 mmol/L   Chloride 104 101 - 111 mmol/L   CO2 24 22 - 32 mmol/L   Glucose, Bld 73 65 - 99 mg/dL   BUN 39 (H) 6 - 20 mg/dL   Creatinine, Ser 4.19 (H) 0.61 - 1.24 mg/dL   Calcium 8.2 (L) 8.9 - 10.3 mg/dL   Phosphorus 3.3 2.5 - 4.6 mg/dL   Albumin 2.1 (L) 3.5 - 5.0 g/dL   GFR calc non Af Amer 14 (L) >60 mL/min   GFR calc Af Amer 16 (L) >60 mL/min   Anion gap 11 5 - 15  Glucose, capillary     Status: None   Collection Time: 09/02/14  7:35 AM  Result Value Ref Range   Glucose-Capillary 73 65 - 99 mg/dL   No results found.  Assessment/Plan: Diagnosis: Deconditioning secondary to acute on chronic renal failure 1. Does the need for close, 24 hr/day medical supervision in  concert with the patient's rehab needs make it unreasonable for this patient to be served in a less intensive setting? Potentially 2. Co-Morbidities requiring supervision/potential complications: Congestive heart failure 3. Due to bowel management, disease management and patient education, does the patient  require 24 hr/day rehab nursing? No 4. Does the patient require coordinated care of a physician, rehab nurse, PT, OT to address physical and functional deficits in the context of the above medical diagnosis(es)? Yes Addressing deficits in the following areas: balance, endurance, locomotion, strength, transferring, bowel/bladder control, bathing and dressing 5. Can the patient actively participate in an intensive therapy program of at least 3 hrs of therapy per day at least 5 days per week? Potentially 6. The potential for patient to make measurable gains while on inpatient rehab is not applicable 7. Anticipated functional outcomes upon discharge from inpatient rehab are n/a  with PT, n/a with OT, n/a with SLP. 8. Estimated rehab length of stay to reach the above functional goals is: Not applicable 9. Does the patient have adequate social supports and living environment to accommodate these discharge functional goals? Potentially 10. Anticipated D/C setting: Home 11. Anticipated post D/C treatments: [place] therapy 12. Overall Rehab/Functional Prognosis: fair  RECOMMENDATIONS: This patient's condition is appropriate for continued rehabilitative care in the following setting: [facility] Therapy Patient has agreed to participate in recommended program. Potentially Note that insurance prior authorization may be required for reimbursement for recommended care.  Comment: Too high level for CIR    09/02/2014

## 2014-09-02 NOTE — Progress Notes (Signed)
Subjective: No complaints, no chest pain, no SOB  Objective: Vital signs in last 24 hours: Temp:  [97.5 F (36.4 C)-98.7 F (37.1 C)] 97.9 F (36.6 C) (08/08 0803) Pulse Rate:  [77-87] 79 (08/08 0803) Resp:  [19-20] 20 (08/08 0803) BP: (126-159)/(82-108) 136/82 mmHg (08/08 0803) SpO2:  [99 %-100 %] 100 % (08/08 0803) Weight:  [173 lb 15.1 oz (78.9 kg)] 173 lb 15.1 oz (78.9 kg) (08/08 0400) Weight change: -2 lb 6.8 oz (-1.1 kg) Last BM Date: 09/01/14 Intake/Output from previous day:+300 08/07 0701 - 08/08 0700 In: 300 [P.O.:300] Out: -  Intake/Output this shift: Total I/O In: 120 [P.O.:120] Out: -   PE: General:Pleasant affect, NAD Skin:Warm and dry, brisk capillary refill HEENT:normocephalic, sclera clear, mucus membranes moist Heart:S1S2 RRR without murmur, gallup, rub or click Lungs:clear without rales, rhonchi, or wheezes [id number], non tender, + BS, do not palpate liver spleen or masses Ext:no lower ext edema, 2+ pedal pulses, 2+ radial pulses Neuro:alert and oriented X 3, MAE, follows commands, + facial symmetry Tele:  SR with PVCs   Lab Results:  Recent Labs  09/01/14 0415 09/02/14 0426  WBC 9.6 9.4  HGB 9.5* 9.5*  HCT 30.1* 29.3*  PLT 287 288   BMET  Recent Labs  09/01/14 0415 09/02/14 0426  NA 139 139  K 3.7 3.9  CL 102 104  CO2 26 24  GLUCOSE 99 73  BUN 26* 39*  CREATININE 3.20* 4.19*  CALCIUM 8.4* 8.2*   No results for input(s): TROPONINI in the last 72 hours.  Invalid input(s): CK, MB  No results found for: CHOL, HDL, LDLCALC, LDLDIRECT, TRIG, CHOLHDL Lab Results  Component Value Date   HGBA1C 7.6* 08/20/2014     Lab Results  Component Value Date   TSH 2.329 08/20/2014    Hepatic Function Panel  Recent Labs  09/02/14 0426  ALBUMIN 2.1*   No results for input(s): CHOL in the last 72 hours. No results for input(s): PROTIME in the last 72 hours.     Studies/Results: ECHO;  08/22/14 Study Conclusions  -  Left ventricle: Myocardium with speckled appearance, consider cardiac amyloidosis (versus ESRD). Smoke noted in LV. The cavity size was normal. Wall thickness was increased in a pattern of severe LVH. The estimated ejection fraction was 10%. Diffuse hypokinesis. Doppler parameters are consistent with abnormal left ventricular relaxation (grade 1 diastolic dysfunction). - Aortic valve: There was no stenosis. There was mild regurgitation. - Mitral valve: There was no significant regurgitation. - Left atrium: The atrium was mildly dilated. - Right ventricle: The cavity size was mildly dilated. Systolic function was severely reduced. - Right atrium: The atrium was mildly dilated. - Atrial septum: There appeared to be left to right flow across the interatrial septum, ?PFO but not fully visualized. - Pulmonary arteries: No complete TR doppler jet so unable to estimate PA systolic pressure. - Systemic veins: IVC measured 1.6 cm with < 50% respirophasic variation, suggesting RA pressure 8 mmHg. - Pericardium, extracardiac: Large left pleural effusion. A small circumferential pericardial effusion was identified.  Impressions:  - Normal LV size with severe LV hypertrophy. Speckled myocardium suggests possibility of cardiac amyloidosis (versus ESRD). EF 10% with diffuse hypokinesis, smoke noted in the LV. Mildly dilated RV with severe systolic dysfunction. Large left pleural effusion, small pericardial effusion Medications: I have reviewed the patient's current medications. Scheduled Meds: . antiseptic oral rinse  7 mL Mouth Rinse BID  . carvedilol  12.5 mg  Oral BID WC  . [START ON 09/03/2014] darbepoetin (ARANESP) injection - DIALYSIS  150 mcg Intravenous Q Tue-HD  . [START ON 09/03/2014] doxercalciferol  2 mcg Intravenous Q T,Th,Sa-HD  . heparin  5,000 Units Subcutaneous 3 times per day  . hydrALAZINE  25 mg Oral QID  . insulin aspart  0-20 Units Subcutaneous TID  AC & HS  . multivitamin  1 tablet Oral QHS  . sodium chloride  10-40 mL Intracatheter Q12H   Continuous Infusions: . sodium chloride Stopped (08/24/14 0300)  . sodium chloride     PRN Meds:.sodium chloride, sodium chloride, heparin, hydrALAZINE, lidocaine (PF), lidocaine-prilocaine, [DISCONTINUED] ondansetron **OR** ondansetron (ZOFRAN) IV, pentafluoroprop-tetrafluoroeth, sodium chloride   Assessment/Plan: Principal Problem:   Anasarca Active Problems:   Acute on chronic combined systolic and diastolic CHF, NYHA class 2   CKD (chronic kidney disease) stage 5, GFR less than 15 ml/min   Benign essential HTN   DM (diabetes mellitus), type 2, uncontrolled, with renal complications   Troponin level elevated   Anemia of chronic disease   Cardiorenal syndrome with renal failure   Ascites   Acute respiratory failure, unspecified whether with hypoxia or hypercapnia   Cardiac arrest   Pressure ulcer   Acute respiratory failure with hypoxia  1. Cardiomyopathy type undetermined probable hypertensive but yet to be determined, possible cardiac cath in the future EF 10%,  2. Acute on chronic systolic heart failure compensated 3. Prior data cardiac cardiac arrest with Arctic sign improving-supposed to have EP consult as an outpatient 4. End-stage renal disease on hemodialysis 5. Poorly controlled hypertension- hydralazine added now with improved control.  6.  Cardiac arrest PEA intra-operatively post catheter ( Tunneled cath for dialysis)-  He is post artic sun   LOS: 13 days   Time spent with pt. :15 minutes. [facility] R  Nurse Practitioner Certified Pager [phone] or after 5pm and on weekends call [phone] 09/02/2014, 9:43 AM  Agree with above assessment.  The patient is now over.  He has made a good neurologic recovery from his PEA arrest treated with Su Hilt. he has end-stage renal disease now on hemodialysis. I would favor an inpatient EP consult now that his neurologic  status has shown significant improvement.  Would also favor cardiac catheterization this admission, possibly Wednesday.

## 2014-09-02 NOTE — Consult Note (Signed)
Cardiologist:  Johnsie Cancel.  Never seen for follow-up.  Reason for Consult:  Severe cardiomyopathy  Referring Physician:   ASIM GERSTEN is an 59 y.o. male.  HPI:   The patient is a 59 yo male with a history of HTN, diabetes, chronic combined sys/dias HF, CKD stage 5.  The patient was admitted on 08/20/14 with anasarca.   Echoin 12/2013 revealed and EF of 15% with diffuse hypokinesis, mild LVH and dilation, G2DD, mod LA dilation, RA and RV mod dilation, Mod TR.  There was plan to do R/L heart caths once on dialysis.   He had a PEA arrest on 08/21/14 during right IJ procedure and was place on hypothermia protocol.    This patient really does not have a good understanding, or is not paying attention when his problems are being discussed with him.   He acted as though he was hearing it for the first time.  LEE has resolved.   Past Medical History  Diagnosis Date  . Hypertension   . Diabetes mellitus without complication        CKD, stage 5      Chronic sys/dist HF        Past Surgical History  Procedure Laterality Date  . External ear surgery    . Tonsillectomy    . Insertion of dialysis catheter N/A 08/21/2014    Procedure: INSERTION OF DIALYSIS CATHETER RIGHT INTERNAL JUGULAR VEIN;  Surgeon: Conrad Rosser, MD;  Location: [facility] [place];  Service: Vascular;  Laterality: N/A;  MAC to General    Family History  Problem Relation Age of Onset  . Heart failure Mother     Social History:  reports that he has quit smoking. His smoking use included Cigarettes. He has never used smokeless tobacco. He reports that he does not drink alcohol or use illicit drugs.  Allergies: No Known Allergies  Medications:  Scheduled Meds: . antiseptic oral rinse  7 mL Mouth Rinse BID  . carvedilol  12.5 mg Oral BID WC  . [START ON 09/03/2014] darbepoetin (ARANESP) injection - DIALYSIS  150 mcg Intravenous Q Tue-HD  . [START ON 09/03/2014] doxercalciferol  2 mcg Intravenous Q T,Th,Sa-HD  . heparin  5,000 Units  Subcutaneous 3 times per day  . hydrALAZINE  25 mg Oral QID  . insulin aspart  0-20 Units Subcutaneous TID AC & HS  . multivitamin  1 tablet Oral QHS  . sodium chloride  10-40 mL Intracatheter Q12H   Continuous Infusions: . sodium chloride Stopped (08/24/14 0300)  . sodium chloride     PRN Meds:.sodium chloride, sodium chloride, heparin, hydrALAZINE, lidocaine (PF), lidocaine-prilocaine, [DISCONTINUED] ondansetron **OR** ondansetron (ZOFRAN) IV, pentafluoroprop-tetrafluoroeth, sodium chloride   Results for orders placed or performed during the hospital encounter of 08/20/14 (from the past 48 hour(s))  Glucose, capillary     Status: Abnormal   Collection Time: 08/31/14  9:35 PM  Result Value Ref Range   Glucose-Capillary 114 (H) 65 - 99 mg/dL  Magnesium     Status: None   Collection Time: 09/01/14  4:15 AM  Result Value Ref Range   Magnesium 2.2 1.7 - 2.4 mg/dL  CBC with Differential/Platelet     Status: Abnormal   Collection Time: 09/01/14  4:15 AM  Result Value Ref Range   WBC 9.6 4.0 - 10.5 K/uL   RBC 3.58 (L) 4.22 - 5.81 MIL/uL   Hemoglobin 9.5 (L) 13.0 - 17.0 g/dL   HCT 30.1 (L) 39.0 - 52.0 %  MCV 84.1 78.0 - 100.0 fL   MCH 26.5 26.0 - 34.0 pg   MCHC 31.6 30.0 - 36.0 g/dL   RDW 19.9 (H) 11.5 - 15.5 %   Platelets 287 150 - 400 K/uL   Neutrophils Relative % 80 (H) 43 - 77 %   Neutro Abs 7.6 1.7 - 7.7 K/uL   Lymphocytes Relative 9 (L) 12 - 46 %   Lymphs Abs 0.8 0.7 - 4.0 K/uL   Monocytes Relative 11 3 - 12 %   Monocytes Absolute 1.0 0.1 - 1.0 K/uL   Eosinophils Relative 0 0 - 5 %   Eosinophils Absolute 0.0 0.0 - 0.7 K/uL   Basophils Relative 0 0 - 1 %   Basophils Absolute 0.0 0.0 - 0.1 K/uL  Renal function panel     Status: Abnormal   Collection Time: 09/01/14  4:15 AM  Result Value Ref Range   Sodium 139 135 - 145 mmol/L   Potassium 3.7 3.5 - 5.1 mmol/L   Chloride 102 101 - 111 mmol/L   CO2 26 22 - 32 mmol/L   Glucose, Bld 99 65 - 99 mg/dL   BUN 26 (H) 6 - 20  mg/dL   Creatinine, Ser 3.20 (H) 0.61 - 1.24 mg/dL   Calcium 8.4 (L) 8.9 - 10.3 mg/dL   Phosphorus 3.0 2.5 - 4.6 mg/dL   Albumin 2.2 (L) 3.5 - 5.0 g/dL   GFR calc non Af Amer 20 (L) >60 mL/min   GFR calc Af Amer 23 (L) >60 mL/min    Comment: (NOTE) The eGFR has been calculated using the CKD EPI equation. This calculation has not been validated in all clinical situations. eGFR's persistently <60 mL/min signify possible Chronic Kidney Disease.    Anion gap 11 5 - 15  Glucose, capillary     Status: Abnormal   Collection Time: 09/01/14  7:34 AM  Result Value Ref Range   Glucose-Capillary 119 (H) 65 - 99 mg/dL  Glucose, capillary     Status: Abnormal   Collection Time: 09/01/14 11:36 AM  Result Value Ref Range   Glucose-Capillary 143 (H) 65 - 99 mg/dL  Glucose, capillary     Status: Abnormal   Collection Time: 09/01/14  5:04 PM  Result Value Ref Range   Glucose-Capillary 142 (H) 65 - 99 mg/dL  Glucose, capillary     Status: Abnormal   Collection Time: 09/01/14  8:23 PM  Result Value Ref Range   Glucose-Capillary 122 (H) 65 - 99 mg/dL  Glucose, capillary     Status: Abnormal   Collection Time: 09/01/14  9:48 PM  Result Value Ref Range   Glucose-Capillary 133 (H) 65 - 99 mg/dL  Magnesium     Status: None   Collection Time: 09/02/14  4:26 AM  Result Value Ref Range   Magnesium 2.1 1.7 - 2.4 mg/dL  CBC with Differential/Platelet     Status: Abnormal   Collection Time: 09/02/14  4:26 AM  Result Value Ref Range   WBC 9.4 4.0 - 10.5 K/uL   RBC 3.45 (L) 4.22 - 5.81 MIL/uL   Hemoglobin 9.5 (L) 13.0 - 17.0 g/dL   HCT 29.3 (L) 39.0 - 52.0 %   MCV 84.9 78.0 - 100.0 fL   MCH 27.5 26.0 - 34.0 pg   MCHC 32.4 30.0 - 36.0 g/dL   RDW 20.5 (H) 11.5 - 15.5 %   Platelets 288 150 - 400 K/uL   Neutrophils Relative % 78 (H) 43 - 77 %  Neutro Abs 7.3 1.7 - 7.7 K/uL   Lymphocytes Relative 11 (L) 12 - 46 %   Lymphs Abs 1.1 0.7 - 4.0 K/uL   Monocytes Relative 10 3 - 12 %   Monocytes Absolute  1.0 0.1 - 1.0 K/uL   Eosinophils Relative 1 0 - 5 %   Eosinophils Absolute 0.1 0.0 - 0.7 K/uL   Basophils Relative 0 0 - 1 %   Basophils Absolute 0.0 0.0 - 0.1 K/uL  Renal function panel     Status: Abnormal   Collection Time: 09/02/14  4:26 AM  Result Value Ref Range   Sodium 139 135 - 145 mmol/L   Potassium 3.9 3.5 - 5.1 mmol/L   Chloride 104 101 - 111 mmol/L   CO2 24 22 - 32 mmol/L   Glucose, Bld 73 65 - 99 mg/dL   BUN 39 (H) 6 - 20 mg/dL   Creatinine, Ser 4.19 (H) 0.61 - 1.24 mg/dL   Calcium 8.2 (L) 8.9 - 10.3 mg/dL   Phosphorus 3.3 2.5 - 4.6 mg/dL   Albumin 2.1 (L) 3.5 - 5.0 g/dL   GFR calc non Af Amer 14 (L) >60 mL/min   GFR calc Af Amer 16 (L) >60 mL/min    Comment: (NOTE) The eGFR has been calculated using the CKD EPI equation. This calculation has not been validated in all clinical situations. eGFR's persistently <60 mL/min signify possible Chronic Kidney Disease.    Anion gap 11 5 - 15  Glucose, capillary     Status: None   Collection Time: 09/02/14  7:35 AM  Result Value Ref Range   Glucose-Capillary 73 65 - 99 mg/dL  Glucose, capillary     Status: Abnormal   Collection Time: 09/02/14 12:00 PM  Result Value Ref Range   Glucose-Capillary 171 (H) 65 - 99 mg/dL    No results found.  Review of Systems  Constitutional: Negative for fever and diaphoresis.  HENT: Negative for congestion.   Respiratory: Negative for cough and shortness of breath.   Cardiovascular: Negative for chest pain, orthopnea and leg swelling.  Gastrointestinal: Negative for nausea, vomiting and abdominal pain.  Musculoskeletal: Negative for myalgias.  Neurological: Negative for dizziness.   Blood pressure 153/86, pulse 77, temperature 97.8 F (36.6 C), temperature source Oral, resp. rate 18, height 6' (1.829 m), weight 173 lb 15.1 oz (78.9 kg), SpO2 100 %. Physical Exam  Nursing note and vitals reviewed. Constitutional: He is oriented to person, place, and time. He appears well-developed  and well-nourished. No distress.  HENT:  Head: Normocephalic and atraumatic.  Eyes: EOM are normal. Pupils are equal, round, and reactive to light. No scleral icterus.  Neck: Normal range of motion. Neck supple. JVD present.  Cardiovascular: Normal rate, regular rhythm, S1 normal and S2 normal.   Murmur heard.  Systolic murmur is present with a grade of 2/6  Pulses:      Radial pulses are 2+ on the right side, and 2+ on the left side.       Dorsalis pedis pulses are 2+ on the right side, and 2+ on the left side.  MM in the axillae  Respiratory: Effort normal. He has no wheezes. He has no rales.  Decreased BS on the right.   GI: Soft. Bowel sounds are normal. He exhibits distension. There is no tenderness.  Musculoskeletal: He exhibits no edema.  Neurological: He is alert and oriented to person, place, and time. He exhibits normal muscle tone.  Skin: Skin is warm and  dry.  Psychiatric: He has a normal mood and affect.    Assessment/Plan: Principal Problem:   Anasarca Active Problems:   Acute on chronic combined systolic and diastolic CHF, NYHA class 2   CKD (chronic kidney disease) stage 5, GFR less than 15 ml/min   Benign essential HTN   DM (diabetes mellitus), type 2, uncontrolled, with renal complications   Troponin level elevated   Anemia of chronic disease   Cardiorenal syndrome with renal failure   Ascites   Acute respiratory failure, unspecified whether with hypoxia or hypercapnia   Cardiac arrest   Pressure ulcer   Acute respiratory failure with hypoxia   NSVT (nonsustained ventricular tachycardia)  59 yo male with bi ventricular failure and no clear understanding about the current condition of his heart.  He has a has a narrow QRS complex and thus is not a candidate for CRT.  He is having NSVT, which was seen on telemetry today, with an EF of 10% and is likely a candidate for ICD device.   Dr. Caryl Comes will follow with recommendation.   Initial plan to do LHC on Wednesday.   HD tomorrow.      Tarri Fuller, [place] 09/02/2014, 2:05 PM    I saw the patient and examined him yesterday. It is refilled undertake catheterization to exclude ischemic heart disease. The other major issue of exclusion is amyloid raised by the echocardiogram although there is no low-voltage on the ECG. The presence of amyloid would inform therapeutic options as well as limitations. Hence, I think medical therapy and elucidation as to the potential presence of amyloid would make sense. African-American males or high risk for transthyretin amyloid and this can be assessed by genetic testing. Fat-pad biopsies and other tools   might also be appropriate.  For now we'll follow from a distance. Let us know if there is anything else that we can do.

## 2014-09-02 NOTE — Progress Notes (Signed)
Physical Therapy Treatment Patient Details Name: Kyle [name] MRN: [id number] DOB: Nov 13, 1955 Today's Date: 09/02/2014    History of Present Illness Pt is a 59 y.o. male with PMH of chronic systolic and diastolic CHF, EF [id number], hypertension, CKD stage 5, diabetes who presented to [facility] ED with worsening abdominal distention, scrotal and leg swelling over past few weeks prior to this admission. He also reported weight gain over past few months and feeling very tired and shortof breath with even minimal exertion. Patient with anasarca.  Initial CXR 7/26 showed small-mod bilateral pleural effusions with bibasilar atelectasis. Pt had vascular surgery 7/27 and then had bradycardia/ PEA arrest with 10 mins CPR before ROSC, no purposeful mental status post-arrest. Intubated 7/27-8/3. Reportedly neuro improvements but continued confusion. Pt had a fall on 8/4 but head CT was clear.    PT Comments    Patient was seated in recliner talking with guests upon PT arrival today, and was agreeable to participate in PT. Based on previous notes and his wife's report, his functional performance and cognition were greatly improved. Patient was able to ambulate, see flowsheet below for details on performance and improved cognition. Patient will benefit from continued PT to progress independence and mobility.   Follow Up Recommendations  CIR;Supervision - Intermittent;Supervision for mobility/OOB     Equipment Recommendations  Rolling walker with 5" wheels    Recommendations for Other Services       Precautions / Restrictions Precautions Precautions: Fall Restrictions Weight Bearing Restrictions: No    Mobility  Bed Mobility               General bed mobility comments: Patient sitting up in chair on PT arrival.  Transfers Overall transfer level: Modified independent   Transfers: Sit to/from Stand Sit to Stand: Supervision (Needs safety cues.)         General transfer comment: Was able to  stand up independently but needed cues for RW use and manual assistance once in standing.  Ambulation/Gait Ambulation/Gait assistance: Min guard Ambulation Distance (Feet): 250 Feet Assistive device: Rolling walker (2 wheeled) Gait Pattern/deviations: Step-to pattern;Step-through pattern;Narrow base of support;Drifts right/left (Inconsistent step-to vs. step-through.)   Gait velocity interpretation: Below normal speed for age/gender General Gait Details: Required cues for safe use of RW and guarding, as pt drifted to the side he was looking towards if he turned his head and was generally unsteady on his feet, although no LOB episodes were noted.   Stairs            Wheelchair Mobility    Modified Rankin (Stroke Patients Only)       Balance Overall balance assessment: Needs assistance Sitting-balance support: Feet supported;Bilateral upper extremity supported Sitting balance-Leahy Scale: Normal Sitting balance - Comments: Patient able to sit in chair with no apparent deficits.   Standing balance support: Bilateral upper extremity supported Standing balance-Leahy Scale: Poor Standing balance comment: Pt requires assistance in standing to maintain balance.                    Cognition Arousal/Alertness: Awake/alert Behavior During Therapy: WFL for tasks assessed/performed Overall Cognitive Status: Within Functional Limits for tasks assessed Area of Impairment: Safety/judgement         Safety/Judgement: Decreased awareness of safety;Decreased awareness of deficits     General Comments: Patient was much improved and close to baseline function per wife report. He was oriented to place, self, time and situation and followed commands with ease, although he has decreased awareness  of deficits and need for safety. Patient was agreeable to safety recommendations after session.    Exercises      General Comments        Pertinent Vitals/Pain Pain Assessment:  No/denies pain    Home Living                      Prior Function            PT Goals (current goals can now be found in the care plan section) Acute Rehab PT Goals Patient Stated Goal: return home and to work PT Goal Formulation: With patient Time For Goal Achievement: 09/13/14 Potential to Achieve Goals: Good Progress towards PT goals: Progressing toward goals    Frequency  Min 3X/week    PT Plan Current plan remains appropriate    Co-evaluation             End of Session Equipment Utilized During Treatment: Gait belt Activity Tolerance: Patient tolerated treatment well Patient left: in chair;with call bell/phone within reach;with chair alarm set;with family/visitor present     Time: [id number] PT Time Calculation (min) (ACUTE ONLY): 21 min  Charges:  $Gait Training: 8-22 mins                    G CodesRoanna Epley, SPT [phone]  09/02/2014, 3:31 PM

## 2014-09-03 LAB — RENAL FUNCTION PANEL
ANION GAP: 12 (ref 5–15)
Albumin: 2 g/dL — ABNORMAL LOW (ref 3.5–5.0)
BUN: 45 mg/dL — AB (ref 6–20)
CO2: 24 mmol/L (ref 22–32)
Calcium: 7.9 mg/dL — ABNORMAL LOW (ref 8.9–10.3)
Chloride: 101 mmol/L (ref 101–111)
Creatinine, Ser: 5.03 mg/dL — ABNORMAL HIGH (ref 0.61–1.24)
GFR calc non Af Amer: 11 mL/min — ABNORMAL LOW (ref >60)
GFR, EST AFRICAN AMERICAN: 13 mL/min — AB (ref >60)
Glucose, Bld: 72 mg/dL (ref 65–99)
Phosphorus: 3.6 mg/dL (ref 2.5–4.6)
Potassium: 3.8 mmol/L (ref 3.5–5.1)
SODIUM: 137 mmol/L (ref 135–145)

## 2014-09-03 LAB — CBC WITH DIFFERENTIAL/PLATELET
Basophils Absolute: 0 10*3/uL (ref 0.0–0.1)
Basophils Relative: 0 % (ref 0–1)
EOS ABS: 0.2 10*3/uL (ref 0.0–0.7)
Eosinophils Relative: 2 % (ref 0–5)
HCT: 28.9 % — ABNORMAL LOW (ref 39.0–52.0)
Hemoglobin: 9.5 g/dL — ABNORMAL LOW (ref 13.0–17.0)
LYMPHS ABS: 1.4 10*3/uL (ref 0.7–4.0)
Lymphocytes Relative: 15 % (ref 12–46)
MCH: 27.4 pg (ref 26.0–34.0)
MCHC: 32.9 g/dL (ref 30.0–36.0)
MCV: 83.3 fL (ref 78.0–100.0)
MONOS PCT: 7 % (ref 3–12)
Monocytes Absolute: 0.6 10*3/uL (ref 0.1–1.0)
NEUTROS ABS: 7 10*3/uL (ref 1.7–7.7)
Neutrophils Relative %: 76 % (ref 43–77)
Platelets: 291 10*3/uL (ref 150–400)
RBC: 3.47 MIL/uL — ABNORMAL LOW (ref 4.22–5.81)
RDW: 20.4 % — AB (ref 11.5–15.5)
WBC: 9.2 10*3/uL (ref 4.0–10.5)

## 2014-09-03 LAB — GLUCOSE, CAPILLARY
GLUCOSE-CAPILLARY: 162 mg/dL — AB (ref 65–99)
Glucose-Capillary: 78 mg/dL (ref 65–99)
Glucose-Capillary: 335 mg/dL — ABNORMAL HIGH (ref 65–99)

## 2014-09-03 LAB — MAGNESIUM: Magnesium: 2 mg/dL (ref 1.7–2.4)

## 2014-09-03 MED ORDER — DOXERCALCIFEROL 4 MCG/2ML IV SOLN
INTRAVENOUS | Status: AC
  Filled 2014-09-03: qty 2

## 2014-09-03 MED ORDER — DARBEPOETIN ALFA 150 MCG/0.3ML IJ SOSY
PREFILLED_SYRINGE | INTRAMUSCULAR | Status: AC
  Filled 2014-09-03: qty 0.3

## 2014-09-03 NOTE — Progress Notes (Addendum)
[facility] PROGRESS NOTE  Kyle [name] [id number] DOB: 1955/02/04 DOA: 08/20/2014 PCP: No primary care provider on file.     PULMONARY / CRITICAL CARE transfer to [facility] service /3W on 8/6 Assessment/Plan: Principal Problem:   Anasarca Active Problems:   Acute on chronic combined systolic and diastolic CHF, NYHA class 2   CKD (chronic kidney disease) stage 5, GFR less than 15 ml/min   Benign essential HTN   DM (diabetes mellitus), type 2, uncontrolled, with renal complications   Troponin level elevated   Anemia of chronic disease   Cardiorenal syndrome with renal failure   Ascites   Acute respiratory failure, unspecified whether with hypoxia or hypercapnia   Cardiac arrest   Pressure ulcer   Acute respiratory failure with hypoxia   NSVT (nonsustained ventricular tachycardia)   Debility    PEA cardiac arrest. Hx of HTN. Cardiology following, continue Coreg twice a day and diuresis with hemodialysis Cardiology recommends left heart cath on Wednesday, EP recommendations pending   Possible Aspiration Pneumonia - completing 7 day course of tx  Acute respiratory failure after cardiac arrest.pleural effusion rt?, pulm edema, gross overlaod  Currently on room air  End-stage renal disease- on CRRT from 7/27-8/3, now on hemodialysis, Had IHD 8/4-   8/6, 8/9 Will need OP HD  and perm access   prior to discharge- . VVS has seen but wanted to wait for him to be more stable.VVS to revisit access as he is much more stable.  Anticipate future hemodialysis Tuesday Thursday Saturday  Nausea/diarrhea-if recurs will obtain abdominal KUB, rule out C. difficile  Diabetes mellitus-continue SSI,Accuchecks to qAC & HS  Acute encephalopathy after cardiac arrest - completely back to baseline,patient did suffer an unwitnessed fall on 8/4. CT scan of the head/neck was negative for any fracture or obvious trauma. Incidentally found thyroid nodule on CT scan of the neck will need to  be followed up as an outpatient.  Anemia of chronic disease- nephrology added aranesp  Cardiomyopathy - acute on chronic systolic heart failure - EF 10%,-  continue Coreg 12 BID (was on Coreg previously). Goal 25 BID. Started on hydralazine by cardiology . Hopefully improve with hemodialysis - no ACE-I secondary to renal function   Code Status:      Code Status Orders        Start     Ordered   08/21/14 1649  Full code   Continuous     08/21/14 1652     Family Communication: family updated about patient's clinical progress Disposition Plan:  Physical therapy recommends CIR   Brief narrative: 59 yo male with PMHx of CKD Stage V (non-compliant with f/u), chronic combined CHF (EF 15%), HTN and T2DM initially admitted 7/26 to [facility] with acute on chronic CHF and acute on CKD with SCr 10.7. Pt was tx to [facility] for placement of tunneled HD cath for initiation of dialysis. He went to OR 7/27 for placement of Tunneled cath (concious sedation only) and intra-operatively (post catheter placement) had bradycardia and ultimately PEA arrest with approx 10 mins CPR before ROSC. No purposeful mental status post arrest. Tx to ICU and PCCM consulted.       Consultants:  Critical care  Nephrology  Cardiology  Vascular surgery    Procedures: STUDIES:  7/27 paracentesis 7/28 Echo >> EF 10% 8/04 CT Head/Neck - negative for fracture or trauma  SIGNIFICANT EVENTS: 7/27 Cardiac arrest, intubated and to ICU, hypothermia 7/29 rewarmed 7/30 more  agitated 8/03 extubated 8/04 unwitnessed fall  Antibiotics: Anti-infectives    Start     Dose/Rate Route Frequency Ordered Stop   08/29/14 1000  piperacillin-tazobactam (ZOSYN) IVPB 2.25 g     2.25 g 100 mL/hr over 30 Minutes Intravenous 3 times per day 08/29/14 0855 08/31/14 0017   08/28/14 1200  piperacillin-tazobactam (ZOSYN) IVPB 3.375 g  Status:  Discontinued     3.375 g 100 mL/hr over 30 Minutes Intravenous 4 times per day  08/28/14 0905 08/28/14 0906   08/28/14 1100  piperacillin-tazobactam (ZOSYN) IVPB 3.375 g  Status:  Discontinued     3.375 g 100 mL/hr over 30 Minutes Intravenous Every 6 hours 08/28/14 0906 08/29/14 0855   08/24/14 1900  vancomycin (VANCOCIN) 1,500 mg in sodium chloride 0.9 % 500 mL IVPB  Status:  Discontinued     1,500 mg 250 mL/hr over 120 Minutes Intravenous Every 48 hours 08/22/14 1830 08/23/14 1332   08/24/14 1800  piperacillin-tazobactam (ZOSYN) IVPB 3.375 g  Status:  Discontinued     3.375 g 12.5 mL/hr over 240 Minutes Intravenous 4 times per day 08/24/14 1715 08/24/14 1719   08/24/14 1800  piperacillin-tazobactam (ZOSYN) IVPB 3.375 g  Status:  Discontinued     3.375 g 100 mL/hr over 30 Minutes Intravenous 4 times per day 08/24/14 1720 08/28/14 0905   08/24/14 1600  nafcillin 1 g in dextrose 5 % 50 mL IVPB  Status:  Discontinued     1 g 100 mL/hr over 30 Minutes Intravenous 6 times per day 08/24/14 1439 08/24/14 1715   08/24/14 1530  nafcillin injection 1 g  Status:  Discontinued     1 g Intravenous Every 4 hours 08/24/14 1428 08/24/14 1439   08/23/14 2100  vancomycin (VANCOCIN) IVPB 1000 mg/200 mL premix  Status:  Discontinued     1,000 mg 200 mL/hr over 60 Minutes Intravenous Every 24 hours 08/23/14 1340 08/24/14 1428   08/23/14 2000  vancomycin (VANCOCIN) IVPB 1000 mg/200 mL premix  Status:  Discontinued     1,000 mg 200 mL/hr over 60 Minutes Intravenous Every 24 hours 08/23/14 1333 08/23/14 1340   08/22/14 1900  vancomycin (VANCOCIN) 1,750 mg in sodium chloride 0.9 % 500 mL IVPB     1,750 mg 250 mL/hr over 120 Minutes Intravenous  Once 08/22/14 1826 08/22/14 2142   08/21/14 1353  ceFAZolin (ANCEF) IVPB 1 g/50 mL premix    Comments:  Send with pt to OR   1 g 100 mL/hr over 30 Minutes Intravenous To [facility] 08/21/14 0837 08/21/14 1500         HPI/Subjective: mildly confused but will wake up. No chest pain or shortness of breath  Objective: Filed Vitals:    09/02/14 1202 09/02/14 2019 09/02/14 2225 09/03/14 0523  BP: 153/86 122/64 152/97 157/98  Pulse: 77 62  74  Temp: 97.8 F (36.6 C) 98 F (36.7 C)  98.8 F (37.1 C)  TempSrc: Oral Oral  Oral  Resp: 18 18  18   Height:      Weight:    78.5 kg (173 lb 1 oz)  SpO2: 100% 100%  100%    Intake/Output Summary (Last 24 hours) at 09/03/14 1220 Last data filed at 09/03/14 0753  Gross per 24 hour  Intake    480 ml  Output      0 ml  Net    480 ml    Exam:  PE: General:Pleasant affect, NAD Skin:Warm and dry, brisk capillary refill HEENT:normocephalic, sclera clear,  mucus membranes moist Heart:S1S2 RRR without murmur, gallup, rub or click Lungs:clear without rales, rhonchi, or wheezes [id number], non tender, + BS, do not palpate liver spleen or masses Ext:no lower ext edema, 2+ pedal pulses, 2+ radial pulses Neuro:alert and oriented X 3, MAE, follows commands, + facial symmetry Tele: SR with PVCs  Data Review   Micro Results Recent Results (from the past 240 hour(s))  Culture, respiratory (NON-Expectorated)     Status: None   Collection Time: 08/24/14  6:52 PM  Result Value Ref Range Status   Specimen Description TRACHEAL ASPIRATE  Final   Special Requests Normal  Final   Gram Stain   Final    MODERATE WBC PRESENT, PREDOMINANTLY PMN NO SQUAMOUS EPITHELIAL CELLS SEEN FEW GRAM POSITIVE RODS Performed at [facility]    Culture   Final    [place] Performed at [facility]    Report Status 08/27/2014 FINAL  Final    Radiology Reports Dg Chest 2 View  08/20/2014   CLINICAL DATA:  Lower extremity edema, CHF and anasarca. History of renal failure.  EXAM: CHEST  2 VIEW  COMPARISON:  01/14/2014 and 06/20/2008 chest radiographs  FINDINGS: Small-moderate bilateral pleural effusions and bibasilar atelectasis again noted.  Mild pulmonary vascular congestion is present.  The visualized cardiomediastinal silhouette is unchanged.  There is no evidence of  pneumothorax.  No acute bony abnormalities are identified.  IMPRESSION: Small-moderate bilateral pleural effusions with bibasilar atelectasis.  Mild pulmonary vascular congestion.   Electronically Signed   By: Margarette Canada M.D.   On: 08/20/2014 15:01   Ct Head Wo Contrast  08/29/2014   CLINICAL DATA:  Trauma. Unwitnessed fall. Confusion and agitation today. Initial encounter.  EXAM: CT HEAD WITHOUT CONTRAST  CT CERVICAL SPINE WITHOUT CONTRAST  TECHNIQUE: Multidetector CT imaging of the head and cervical spine was performed following the standard protocol without intravenous contrast. Multiplanar CT image reconstructions of the cervical spine were also generated.  COMPARISON:  None.  FINDINGS: CT HEAD FINDINGS  Study is mildly motion degraded. Punctate hyperdense focus near the foramen of Magendie is consistent with calcification rather than hemorrhage. There is no evidence of acute intracranial hemorrhage. Ventricles and sulci are normal in size for age. There is no evidence of acute large territory infarct, mass, midline shift, or extra-axial fluid collection. Periventricular white-matter hypodensities are nonspecific but compatible with mild chronic small vessel ischemic disease.  Orbits are unremarkable. The visualized mastoid air cells are clear. There is mild right frontal and bilateral ethmoid sinus mucosal thickening. No skull fracture is identified.  CT CERVICAL SPINE FINDINGS  There is mild motion artifact through the mid to lower cervical spine despite repeating the study. There is straightening of the normal cervical lordosis. There is no listhesis. Moderate to severe disc space narrowing is present from C4-5 to C6-7 with associated degenerative endplate changes. No cervical spine fracture is identified. Moderate spinal stenosis is suspected at C4-5 and C5-6 due to broad-based posterior disc osteophyte complexes, and there is moderate to severe neural foraminal stenosis on the left at C4-5 and on the  right at C5-6.  Anasarca is noted. A right jugular central venous catheter is partially visualized. A 2.5 cm hypoattenuating nodule is noted in the right thyroid lobe. Bilateral pleural effusions are partially visualized. Skin staples are noted in the right lower neck.  IMPRESSION: 1. Mildly motion degraded examination. 2. No evidence of acute intracranial abnormality. 3. No acute osseous abnormality identified in the cervical spine.  Multilevel cervical disc degeneration. 4. Anasarca and bilateral pleural effusions, incompletely visualized. 5. 2.5 cm right thyroid nodule. Further evaluation with outpatient thyroid ultrasound is suggested.   Electronically Signed   By: Logan Bores   On: 08/29/2014 15:32   Ct Cervical Spine Wo Contrast  08/29/2014   CLINICAL DATA:  Trauma. Unwitnessed fall. Confusion and agitation today. Initial encounter.  EXAM: CT HEAD WITHOUT CONTRAST  CT CERVICAL SPINE WITHOUT CONTRAST  TECHNIQUE: Multidetector CT imaging of the head and cervical spine was performed following the standard protocol without intravenous contrast. Multiplanar CT image reconstructions of the cervical spine were also generated.  COMPARISON:  None.  FINDINGS: CT HEAD FINDINGS  Study is mildly motion degraded. Punctate hyperdense focus near the foramen of Magendie is consistent with calcification rather than hemorrhage. There is no evidence of acute intracranial hemorrhage. Ventricles and sulci are normal in size for age. There is no evidence of acute large territory infarct, mass, midline shift, or extra-axial fluid collection. Periventricular white-matter hypodensities are nonspecific but compatible with mild chronic small vessel ischemic disease.  Orbits are unremarkable. The visualized mastoid air cells are clear. There is mild right frontal and bilateral ethmoid sinus mucosal thickening. No skull fracture is identified.  CT CERVICAL SPINE FINDINGS  There is mild motion artifact through the mid to lower cervical  spine despite repeating the study. There is straightening of the normal cervical lordosis. There is no listhesis. Moderate to severe disc space narrowing is present from C4-5 to C6-7 with associated degenerative endplate changes. No cervical spine fracture is identified. Moderate spinal stenosis is suspected at C4-5 and C5-6 due to broad-based posterior disc osteophyte complexes, and there is moderate to severe neural foraminal stenosis on the left at C4-5 and on the right at C5-6.  Anasarca is noted. A right jugular central venous catheter is partially visualized. A 2.5 cm hypoattenuating nodule is noted in the right thyroid lobe. Bilateral pleural effusions are partially visualized. Skin staples are noted in the right lower neck.  IMPRESSION: 1. Mildly motion degraded examination. 2. No evidence of acute intracranial abnormality. 3. No acute osseous abnormality identified in the cervical spine. Multilevel cervical disc degeneration. 4. Anasarca and bilateral pleural effusions, incompletely visualized. 5. 2.5 cm right thyroid nodule. Further evaluation with outpatient thyroid ultrasound is suggested.   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  PROCEDURE: An ultrasound guided paracentesis was thoroughly discussed with the patient and questions answered. The benefits, risks, alternatives and complications were also discussed. The patient understands and wishes to proceed with the procedure. Written consent was obtained.  Ultrasound was performed to localize and mark an adequate pocket of fluid in the left lower quadrant of the abdomen. The area was then prepped and draped in the normal sterile fashion. 1% Lidocaine was used for local anesthesia. Under ultrasound guidance a 19  gauge Yueh catheter was introduced. Paracentesis was performed. The catheter was removed and a dressing applied.  COMPLICATIONS: None.  FINDINGS: A total of approximately 2.5 liters of clear yellow fluid was removed. A fluid sample was sent for laboratory analysis.  IMPRESSION: Successful ultrasound guided paracentesis yielding 2.5 liters of ascites.  Read by:  Gareth Eagle, PA-C   Electronically Signed   By: Sandi Mariscal  M.D.   On: 08/21/2014 14:24   Dg Chest Port 1 View  08/26/2014   CLINICAL DATA:  CHF, acute respiratory failure, anasarca  EXAM: PORTABLE CHEST - 1 VIEW  COMPARISON:  Portable chest x-ray of August 25, 2014  FINDINGS: The lungs remain hypoinflated. There are persistent bilateral pleural effusions as well bibasilar atelectasis or infiltrate. There is no pneumothorax. The cardiac silhouette is largely obscured. The central pulmonary vascularity is more distinct today. The endotracheal tube tip lies 2 cm above the carina. The esophagogastric tube tip projects below the inferior margin of the image. The left internal jugular venous catheter tip projects over the midportion of the SVC. The large caliber dual-lumen right internal jugular catheter projects over lower third of the SVC.  IMPRESSION: Slight interval improvement in the appearance of the will pulmonary interstitium and central pulmonary vascularity may reflect improving pulmonary edema. Bilateral pleural effusions and bibasilar atelectasis or infiltrate persists. The support tubes are in reasonable position.   Electronically Signed   By: David  Martinique M.D.   On: 08/26/2014 07:12   Dg Chest Port 1 View  08/25/2014   CLINICAL DATA:  Cardiac arrest  EXAM: PORTABLE CHEST - 1 VIEW  COMPARISON:  Radiograph 08/24/2014  FINDINGS: Endotracheal tube is approximately 1 cm from the carina. LEFT central venous line and NG tube are unchanged. There are low lung volumes and basilar atelectasis. Small effusions noted. Central venous congestion present. No  pneumothorax.  IMPRESSION: 1. Endotracheal tube is relatively low approximately 1 cm from carina. No significant change. 2. Low lung volumes, bibasilar atelectasis and small effusions.   Electronically Signed   By: Suzy Bouchard M.D.   On: 08/25/2014 09:09   Dg Chest Port 1 View  08/24/2014   CLINICAL DATA:  Acute respiratory failure, hypoxia. Exchange of endotracheal tube.  EXAM: PORTABLE CHEST - 1 VIEW  COMPARISON:  08/24/2014  FINDINGS: Endotracheal tube is 2.5 cm above the carina. Left central line and right dialysis catheter is well is NG tube are unchanged.  Bilateral perihilar and lower lobe opacities with layering effusions, unchanged.  IMPRESSION: Endotracheal tube 2.5 cm above the carina. Otherwise no significant change.   Electronically Signed   By: Rolm Baptise M.D.   On: 08/24/2014 18:29   Dg Chest Port 1 View  08/24/2014   CLINICAL DATA:  Respiratory failure  EXAM: PORTABLE CHEST - 1 VIEW  COMPARISON:  08/23/2014  FINDINGS: Layering moderate right pleural effusion. Small left pleural effusion.  Associated bilateral lower lobe opacities, likely atelectasis.  Pulmonary vascular congestion without frank interstitial edema.  Endotracheal tube terminates 5.5 cm above the carina.  The heart is normal in size.  Right IJ dual lumen dialysis catheter with its tip in the right atrium. Left IJ venous catheter with its tip in the mid SVC.  Enteric tube courses below the diaphragm.  IMPRESSION: Pulmonary vascular congestion without frank interstitial edema.  Layering moderate right pleural effusion. Small left pleural effusion.  Associated bilateral lower lobe opacities, likely atelectasis.  Endotracheal tube terminates 5.5 cm above the carina. Additional support apparatus as above.   Electronically Signed   By: Julian Hy M.D.   On: 08/24/2014 08:17   Dg Chest Port 1 View  08/23/2014   CLINICAL DATA:  Respiratory difficulty  EXAM: PORTABLE CHEST - 1 VIEW  COMPARISON:  08/21/2014  FINDINGS:  Tubular devices are stable. Normal heart size. Bilateral pleural effusions are stable. Central airspace disease is stable. No pneumothorax.  IMPRESSION: Stable bilateral pleural  effusions and bilateral central airspace disease.   Electronically Signed   By: Marybelle Killings M.D.   On: 08/23/2014 07:44   Dg Chest Port 1 View  08/21/2014   CLINICAL DATA:  Hypoxia  EXAM: PORTABLE CHEST - 1 VIEW  COMPARISON:  August 20, 2014  FINDINGS: Endotracheal tube tip is 2.9 cm above the carina. Left jugular catheter tip is in the superior vena cava just beyond the junction with the left innominate vein. Dual-lumen catheter tip is in the right atrium. Nasogastric tube tip and side port are below the diaphragm. No pneumothorax. There is cardiomegaly with bilateral effusions and alveolar edema bilaterally. No adenopathy.  IMPRESSION: Congestive heart failure. Tube and catheter positions as described without pneumothorax.   Electronically Signed   By: Lowella Grip III M.D.   On: 08/21/2014 18:41   Dg Abd Portable 1v  08/25/2014   CLINICAL DATA:  Ileus.  EXAM: PORTABLE ABDOMEN - 1 VIEW  COMPARISON:  08/21/2014  FINDINGS: NG tube remains in the stomach. There is a nonobstructive bowel gas pattern. No gaseous distention to suggest ileus. No free air or visible organomegaly or suspicious calcification. No acute bony abnormality.  IMPRESSION: No evidence of obstruction or ileus.   Electronically Signed   By: Rolm Baptise M.D.   On: 08/25/2014 12:43   Dg Abd Portable 1v  08/21/2014   CLINICAL DATA:  OG tube placement  EXAM: PORTABLE ABDOMEN - 1 VIEW  COMPARISON:  None.  FINDINGS: NG tube with tip in the gastric body. Paucity of gas in the abdomen. Bilateral pleural effusions.  IMPRESSION: NG tube with tip in the stomach.   Electronically Signed   By: Suzy Bouchard M.D.   On: 08/21/2014 18:42   Dg Fluoro Guide Cv Line-no Report  08/21/2014   CLINICAL DATA:    FLOURO GUIDE CV LINE  Fluoroscopy was utilized by the requesting  physician.  No radiographic  interpretation.      CBC  Recent Labs Lab 08/30/14 0220 08/31/14 0724 09/01/14 0415 09/02/14 0426 09/03/14 0505  WBC 11.9* 9.2 9.6 9.4 9.2  HGB 9.4* 8.9* 9.5* 9.5* 9.5*  HCT 29.1* 27.8* 30.1* 29.3* 28.9*  PLT 254 264 287 288 291  MCV 82.9 83.2 84.1 84.9 83.3  MCH 26.8 26.6 26.5 27.5 27.4  MCHC 32.3 32.0 31.6 32.4 32.9  RDW 19.5* 19.4* 19.9* 20.5* 20.4*  LYMPHSABS 0.9 0.9 0.8 1.1 1.4  MONOABS 1.2* 0.8 1.0 1.0 0.6  EOSABS 0.0 0.1 0.0 0.1 0.2  BASOSABS 0.0 0.0 0.0 0.0 0.0    Chemistries   Recent Labs Lab 08/30/14 0220 08/31/14 0724 09/01/14 0415 09/02/14 0426 09/03/14 0505  NA 140 139 139 139 137  K 4.3 3.8 3.7 3.9 3.8  CL 103 103 102 104 101  CO2 23 22 26 24 24   GLUCOSE 78 88 99 73 72  BUN 32* 48* 26* 39* 45*  CREATININE 2.76* 4.12* 3.20* 4.19* 5.03*  CALCIUM 8.2* 8.4* 8.4* 8.2* 7.9*  MG 2.2 2.3 2.2 2.1 2.0   ------------------------------------------------------------------------------------------------------------------ estimated creatinine clearance is 17.4 mL/min (by C-G formula based on Cr of 5.03). ------------------------------------------------------------------------------------------------------------------ No results for input(s): HGBA1C in the last 72 hours. ------------------------------------------------------------------------------------------------------------------ No results for input(s): CHOL, HDL, LDLCALC, TRIG, CHOLHDL, LDLDIRECT in the last 72 hours. ------------------------------------------------------------------------------------------------------------------ No results for input(s): TSH, T4TOTAL, T3FREE, THYROIDAB in the last 72 hours.  Invalid input(s): FREET3 ------------------------------------------------------------------------------------------------------------------ No results for input(s): VITAMINB12, FOLATE, FERRITIN, TIBC, IRON, RETICCTPCT in the last 72 hours.  Coagulation profile No results  for input(s): INR, PROTIME in the last 168 hours.  No results for input(s): DDIMER in the last 72 hours.  Cardiac Enzymes No results for input(s): CKMB, TROPONINI, MYOGLOBIN in the last 168 hours.  Invalid input(s): CK ------------------------------------------------------------------------------------------------------------------ Invalid input(s): POCBNP   CBG:  Recent Labs Lab 09/02/14 1648 09/02/14 2048 09/02/14 2244 09/03/14 0640 09/03/14 1116  GLUCAP 172* 147* 162* 78 162*       Studies: No results found.    Lab Results  Component Value Date   HGBA1C 7.6* 08/20/2014   HGBA1C 6.5* 01/13/2014   Lab Results  Component Value Date   CREATININE 5.03* 09/03/2014       Scheduled Meds: . antiseptic oral rinse  7 mL Mouth Rinse BID  . carvedilol  12.5 mg Oral BID WC  . darbepoetin (ARANESP) injection - DIALYSIS  150 mcg Intravenous Q Tue-HD  . doxercalciferol  2 mcg Intravenous Q T,Th,Sa-HD  . heparin  5,000 Units Subcutaneous 3 times per day  . hydrALAZINE  25 mg Oral QID  . insulin aspart  0-20 Units Subcutaneous TID AC & HS  . multivitamin  1 tablet Oral QHS  . sodium chloride  10-40 mL Intracatheter Q12H   Continuous Infusions: . sodium chloride Stopped (08/24/14 0300)  . sodium chloride      Principal Problem:   Anasarca Active Problems:   Acute on chronic combined systolic and diastolic CHF, NYHA class 2   CKD (chronic kidney disease) stage 5, GFR less than 15 ml/min   Benign essential HTN   DM (diabetes mellitus), type 2, uncontrolled, with renal complications   Troponin level elevated   Anemia of chronic disease   Cardiorenal syndrome with renal failure   Ascites   Acute respiratory failure, unspecified whether with hypoxia or hypercapnia   Cardiac arrest   Pressure ulcer   Acute respiratory failure with hypoxia   NSVT (nonsustained ventricular tachycardia)   Debility    Time spent: 45 minutes   [address]  [facility] Pager [phone]. If 7PM-7AM, please contact night-coverage at [email or web address], password [facility] 09/03/2014, 12:20 PM  LOS: 14 days

## 2014-09-03 NOTE — Progress Notes (Addendum)
Patient Name: Kyle [name] Date of Encounter: 09/03/2014   SUBJECTIVE  Feels better, denies chest pain, sob or palpitation.   CURRENT MEDS . antiseptic oral rinse  7 mL Mouth Rinse BID  . carvedilol  12.5 mg Oral BID WC  . darbepoetin (ARANESP) injection - DIALYSIS  150 mcg Intravenous Q Tue-HD  . doxercalciferol  2 mcg Intravenous Q T,Th,Sa-HD  . heparin  5,000 Units Subcutaneous 3 times per day  . hydrALAZINE  25 mg Oral QID  . insulin aspart  0-20 Units Subcutaneous TID AC & HS  . multivitamin  1 tablet Oral QHS  . sodium chloride  10-40 mL Intracatheter Q12H    OBJECTIVE  Filed Vitals:   09/02/14 1202 09/02/14 2019 09/02/14 2225 09/03/14 0523  BP: 153/86 122/64 152/97 157/98  Pulse: 77 62  74  Temp: 97.8 F (36.6 C) 98 F (36.7 C)  98.8 F (37.1 C)  TempSrc: Oral Oral  Oral  Resp: 18 18  18   Height:      Weight:    173 lb 1 oz (78.5 kg)  SpO2: 100% 100%  100%    Intake/Output Summary (Last 24 hours) at 09/03/14 0800 Last data filed at 09/03/14 0753  Gross per 24 hour  Intake    720 ml  Output      0 ml  Net    720 ml   Filed Weights   08/31/14 1811 09/02/14 0400 09/03/14 0523  Weight: 176 lb 5.9 oz (80 kg) 173 lb 15.1 oz (78.9 kg) 173 lb 1 oz (78.5 kg)    PHYSICAL EXAM  General: Pleasant, NAD. Neuro: Alert and oriented X 3. Moves all extremities spontaneously. Psych: Normal affect. HEENT:  Normal  Neck: Supple without bruits or JVD. Lungs:  Resp regular and unlabored, CTA. Heart: RRR no s3, s4, or murmurs. Abdomen: Soft, non-tender, non-distended, BS + x 4.  Extremities: No clubbing, cyanosis or edema. DP/PT/Radials 2+ and equal bilaterally.  Accessory Clinical Findings  CBC  Recent Labs  09/02/14 0426 09/03/14 0505  WBC 9.4 9.2  NEUTROABS 7.3 7.0  HGB 9.5* 9.5*  HCT 29.3* 28.9*  MCV 84.9 83.3  PLT 288 [id number]   Basic Metabolic Panel  Recent Labs  09/02/14 0426 09/03/14 0505  NA 139 137  K 3.9 3.8  CL 104 101  CO2 24 24    GLUCOSE 73 72  BUN 39* 45*  CREATININE 4.19* 5.03*  CALCIUM 8.2* 7.9*  MG 2.1 2.0  PHOS 3.3 3.6   Liver Function Tests  Recent Labs  09/02/14 0426 09/03/14 0505  ALBUMIN 2.1* 2.0*    TELE  SR with PVCs.   Radiology/Studies ECHO; 08/22/14 Study Conclusions  - Left ventricle: Myocardium with speckled appearance, consider cardiac amyloidosis (versus ESRD). Smoke noted in LV. The cavity size was normal. Wall thickness was increased in a pattern of severe LVH. The estimated ejection fraction was 10%. Diffuse hypokinesis. Doppler parameters are consistent with abnormal left ventricular relaxation (grade 1 diastolic dysfunction). - Aortic valve: There was no stenosis. There was mild regurgitation. - Mitral valve: There was no significant regurgitation. - Left atrium: The atrium was mildly dilated. - Right ventricle: The cavity size was mildly dilated. Systolic function was severely reduced. - Right atrium: The atrium was mildly dilated. - Atrial septum: There appeared to be left to right flow across the interatrial septum, ?PFO but not fully visualized. - Pulmonary arteries: No complete TR doppler jet so unable to estimate PA systolic pressure. -  Systemic veins: IVC measured 1.6 cm with < 50% respirophasic variation, suggesting RA pressure 8 mmHg. - Pericardium, extracardiac: Large left pleural effusion. A small circumferential pericardial effusion was identified.  Impressions:  - Normal LV size with severe LV hypertrophy. Speckled myocardium suggests possibility of cardiac amyloidosis (versus ESRD). EF 10% with diffuse hypokinesis, smoke noted in the LV. Mildly dilated RV with severe systolic dysfunction. Large left pleural effusion, small pericardial effusion  ASSESSMENT AND PLAN  1. Cardiomyopathy type undetermined probable hypertensive but yet to be determined, possible cardiac cath Wednesday, EF 10%. Will discuss with MD.  2.  Acute on chronic systolic heart failure compensated - EF 10% on echo. Continue Coreg, not on ACE due to ESRD 3. End-stage renal disease on hemodialysis - HD today, VVS will reassess for access prior to discharge.  4. Poorly controlled hypertension- BP improved on hydralazine , still elevated at time - hopefully improves after HD 5. Cardiac arrest PEA intra-operatively post catheter ( Tunneled cath for dialysis)- He is post artic sun. Pending final EP recommendation.  6. Anemia of chronic disease- nephrology added aranesp  Signed, Bhagat,Bhavinkumar PA-C Pager [phone]  The patient has not been having any chest discomfort.  He denies shortness of breath.  EP recommendation is pending.  He is post PEA cardiac arrest.  Lungs are clear.  The heart reveals no murmur gallop or rub. He will have dialysis today. Discussed with Dr. Caryl Comes.  Will plan for left heart cath Wednesday.

## 2014-09-03 NOTE — Progress Notes (Signed)
S: eating well, sitting in chair.  No new CO O:BP 157/98 mmHg  Pulse 74  Temp(Src) 98.8 F (37.1 C) (Oral)  Resp 18  Ht 6' (1.829 m)  Wt 78.5 kg (173 lb 1 oz)  BMI 23.47 kg/m2  SpO2 100%  Intake/Output Summary (Last 24 hours) at 09/03/14 0726 Last data filed at 09/02/14 2235  Gross per 24 hour  Intake    600 ml  Output      0 ml  Net    600 ml   Weight change: -0.4 kg (-14.1 oz) [id number] and alert CVS:RRR Resp:Decreased BS bases Abd:+ BS NTND Ext: No edema NEURO: CNI Ox3, no asterixis Rt IJ Permcath   . antiseptic oral rinse  7 mL Mouth Rinse BID  . carvedilol  12.5 mg Oral BID WC  . darbepoetin (ARANESP) injection - DIALYSIS  150 mcg Intravenous Q Tue-HD  . doxercalciferol  2 mcg Intravenous Q T,Th,Sa-HD  . heparin  5,000 Units Subcutaneous 3 times per day  . hydrALAZINE  25 mg Oral QID  . insulin aspart  0-20 Units Subcutaneous TID AC & HS  . multivitamin  1 tablet Oral QHS  . sodium chloride  10-40 mL Intracatheter Q12H   No results found. BMET    Component Value Date/Time   NA 137 09/03/2014 0505   K 3.8 09/03/2014 0505   CL 101 09/03/2014 0505   CO2 24 09/03/2014 0505   GLUCOSE 72 09/03/2014 0505   BUN 45* 09/03/2014 0505   CREATININE 5.03* 09/03/2014 0505   CALCIUM 7.9* 09/03/2014 0505   GFRNONAA 11* 09/03/2014 0505   GFRAA 13* 09/03/2014 0505   CBC    Component Value Date/Time   WBC 9.2 09/03/2014 0505   RBC 3.47* 09/03/2014 0505   RBC 3.59* 01/15/2014 0415   HGB 9.5* 09/03/2014 0505   HCT 28.9* 09/03/2014 0505   PLT 291 09/03/2014 0505   MCV 83.3 09/03/2014 0505   MCH 27.4 09/03/2014 0505   MCHC 32.9 09/03/2014 0505   RDW 20.4* 09/03/2014 0505   LYMPHSABS 1.4 09/03/2014 0505   MONOABS 0.6 09/03/2014 0505   EOSABS 0.2 09/03/2014 0505   BASOSABS 0.0 09/03/2014 0505     Assessment:  1. New ESRD 2. Anemia on aranesp 3. Sec HPTH on hectorol   Plan: 1. HD today 2.  Ask [facility]VS to see to consider access placement prior to DC 3. Note  cardiac plans for ht cath   Kyle [name] T

## 2014-09-03 NOTE — Progress Notes (Signed)
CSW (Clinical Education officer, museum) received consult for SNF. Per notes, plan is more appropriate for pt to dc home with home health services. RNCM already working with pt and aware of needs. Please reconsult if new social work needs arise. CSW signing off.  [place], [place]

## 2014-09-03 NOTE — Progress Notes (Signed)
Physical Therapy Treatment Patient Details Name: Kyle [name] MRN: [id number] DOB: 1955/10/12 Today's Date: 09/03/2014    History of Present Illness Pt is a 59 y.o. male with PMH of chronic systolic and diastolic CHF, EF [id number], hypertension, CKD stage 5, diabetes who presented to [facility] ED with worsening abdominal distention, scrotal and leg swelling over past few weeks prior to this admission. He also reported weight gain over past few months and feeling very tired and shortof breath with even minimal exertion. Patient with anasarca.  Initial CXR 7/26 showed small-mod bilateral pleural effusions with bibasilar atelectasis. Pt had vascular surgery 7/27 and then had bradycardia/ PEA arrest with 10 mins CPR before ROSC, no purposeful mental status post-arrest. Intubated 7/27-8/3. Reportedly neuro improvements but continued confusion. Pt had a fall on 8/4 but head CT was clear.    PT Comments    Pt continues to have some mild cognitive deficits related to safety and awareness.  He was able to walk without the RW today with min hand held assist for balance.  He has difficulty when walking and talking and walking and turning his head (immediate staggering/LOB).  Pt will need 24/7 assist and supervision at home and will need [facility] services at discharge.    Follow Up Recommendations  Home health PT;Supervision/Assistance - 24 hour     Equipment Recommendations  Rolling walker with 5" wheels    Recommendations for Other Services OT consult     Precautions / Restrictions Precautions Precautions: Fall Precaution Comments: decreased awareness    Mobility  Bed Mobility Overal bed mobility: Modified Independent             General bed mobility comments: using railing for leverage, but otherwise, unassisted.  Transfers Overall transfer level: Needs assistance Equipment used: Rolling walker (2 wheeled) Transfers: Sit to/from Stand Sit to Stand: Supervision         General transfer comment:  supervision for safety as once on his feet, his balance is impaired.   Ambulation/Gait Ambulation/Gait assistance: Min guard;Min assist Ambulation Distance (Feet): 250 Feet Assistive device: Rolling walker (2 wheeled);1 person hand held assist Gait Pattern/deviations: Step-through pattern;Staggering left;Staggering right Gait velocity: decreased Gait velocity interpretation: Below normal speed for age/gender General Gait Details: used RW for first half of gait.  Pt much better today with RW as far as his steadiness.  Took RW away and used hand held assist to walk the rest of the way.  Pt with staggering gait pattern and up to min assist to help prevent falls.  Challendged pt with head turns during gait.           Balance Overall balance assessment: Needs assistance Sitting-balance support: Feet supported;No upper extremity supported Sitting balance-Leahy Scale: Good     Standing balance support: No upper extremity supported;Bilateral upper extremity supported;Single extremity supported Standing balance-Leahy Scale: Fair                      Cognition Arousal/Alertness: Awake/alert Behavior During Therapy: Agitated Overall Cognitive Status: Impaired/Different from baseline Area of Impairment: Safety/judgement;Awareness         Safety/Judgement: Decreased awareness of safety;Decreased awareness of deficits Awareness: Emergent   General Comments: Pt with continued almost denial of his deficits and how much he needs to work to get back to baseline.     Exercises Other Exercises Other Exercises: tandem stand bil and SLS bil standing at the sink, multiple trials up to min physical assist while practicing.  Pertinent Vitals/Pain Pain Assessment: No/denies pain           PT Goals (current goals can now be found in the care plan section) Acute Rehab PT Goals Patient Stated Goal: return home and to work Progress towards PT goals: Progressing toward  goals    Frequency  Min 3X/week    PT Plan Discharge plan needs to be updated       End of Session Equipment Utilized During Treatment: Gait belt Activity Tolerance: Patient tolerated treatment well Patient left: in bed;with call bell/phone within reach;with bed alarm set     Time: 1345-1417 PT Time Calculation (min) (ACUTE ONLY): 32 min  Charges:  $Gait Training: 8-22 mins $Neuromuscular Re-education: 8-22 mins                      Itzayanna Kaster B. [place], [place], DPT [phone]   09/03/2014, 2:31 PM

## 2014-09-03 NOTE — Progress Notes (Signed)
Rehab admissions - Patient doing too well to meet criteria for acute inpatient rehab admission.  Recommend home with [facility] therapies per rehab MD consult.  Call me for questions.  [id number]

## 2014-09-03 NOTE — Care Management Note (Signed)
Case Management Note  Patient Details  Name: SANTFORD SOBALVARRO MRN: [id number] Date of Birth: Feb 14, 1955  Subjective/Objective:   Pt admitted for abdominal distention, swelling. Pt with Hx Acute on Chronic CHF and RF.  PEA arrest on 08/21/14 during right IJ procedure and was place on hypothermia protocol. Initiated on HD. EP consulting.                     Action/Plan: CM will continue to monitor for disposition needs.    Expected Discharge Date:  08/27/14               Expected Discharge Plan:  [place]  In-House Referral:     Discharge planning Services  CM Consult  Post Acute Care Choice:    Choice offered to:     DME Arranged:    DME Agency:     HH Arranged:    HH Agency:     Status of Service:  In process, will continue to follow  Medicare Important Message Given:    Date Medicare IM Given:    Medicare IM give by:    Date Additional Medicare IM Given:    Additional Medicare Important Message give by:     If discussed at [place] of Stay Meetings, dates discussed:    Additional Comments:  Bethena Roys, RN 09/03/2014, 3:14 PM

## 2014-09-03 NOTE — Progress Notes (Signed)
   Daily Progress Note  EP planning on [facility] Wednesday.  Will check on pt later this week to check his status.  Adele Barthel, MD [facility] Office: [phone] Pager: [phone]  09/03/2014, 7:45 AM

## 2014-09-04 ENCOUNTER — Encounter (HOSPITAL_COMMUNITY)
Admission: EM | Disposition: A | Discharge status: Home Health | Payer: Self-pay | Source: Home / Self Care | Attending: Pulmonary Disease

## 2014-09-04 DIAGNOSIS — I2510 Atherosclerotic heart disease of native coronary artery without angina pectoris: Secondary | ICD-10-CM

## 2014-09-04 DIAGNOSIS — I429 Cardiomyopathy, unspecified: Secondary | ICD-10-CM

## 2014-09-04 DIAGNOSIS — N186 End stage renal disease: Secondary | ICD-10-CM

## 2014-09-04 DIAGNOSIS — D649 Anemia, unspecified: Secondary | ICD-10-CM

## 2014-09-04 HISTORY — PX: CARDIAC CATHETERIZATION: SHX172

## 2014-09-04 LAB — CBC WITH DIFFERENTIAL/PLATELET
Basophils Absolute: 0 10*3/uL (ref 0.0–0.1)
Basophils Relative: 0 % (ref 0–1)
EOS ABS: 0.1 10*3/uL (ref 0.0–0.7)
Eosinophils Relative: 1 % (ref 0–5)
HEMATOCRIT: 29.3 % — AB (ref 39.0–52.0)
Hemoglobin: 9.4 g/dL — ABNORMAL LOW (ref 13.0–17.0)
LYMPHS PCT: 10 % — AB (ref 12–46)
Lymphs Abs: 1 10*3/uL (ref 0.7–4.0)
MCH: 27.4 pg (ref 26.0–34.0)
MCHC: 32.1 g/dL (ref 30.0–36.0)
MCV: 85.4 fL (ref 78.0–100.0)
Monocytes Absolute: 0.7 10*3/uL (ref 0.1–1.0)
Monocytes Relative: 7 % (ref 3–12)
NEUTROS ABS: 8.5 10*3/uL — AB (ref 1.7–7.7)
Neutrophils Relative %: 82 % — ABNORMAL HIGH (ref 43–77)
PLATELETS: 265 10*3/uL (ref 150–400)
RBC: 3.43 MIL/uL — ABNORMAL LOW (ref 4.22–5.81)
RDW: 20.9 % — ABNORMAL HIGH (ref 11.5–15.5)
WBC: 10.4 10*3/uL (ref 4.0–10.5)

## 2014-09-04 LAB — RENAL FUNCTION PANEL
Albumin: 2 g/dL — ABNORMAL LOW (ref 3.5–5.0)
Anion gap: 8 (ref 5–15)
BUN: 24 mg/dL — AB (ref 6–20)
CALCIUM: 7.6 mg/dL — AB (ref 8.9–10.3)
CHLORIDE: 102 mmol/L (ref 101–111)
CO2: 28 mmol/L (ref 22–32)
CREATININE: 3.64 mg/dL — AB (ref 0.61–1.24)
GFR calc Af Amer: 20 mL/min — ABNORMAL LOW (ref >60)
GFR calc non Af Amer: 17 mL/min — ABNORMAL LOW (ref >60)
GLUCOSE: 107 mg/dL — AB (ref 65–99)
Phosphorus: 2.4 mg/dL — ABNORMAL LOW (ref 2.5–4.6)
Potassium: 4.1 mmol/L (ref 3.5–5.1)
Sodium: 138 mmol/L (ref 135–145)

## 2014-09-04 LAB — POCT I-STAT 3, VENOUS BLOOD GAS (G3P V)
Acid-Base Excess: 2 mmol/L (ref 0.0–2.0)
BICARBONATE: 26.9 meq/L — AB (ref 20.0–24.0)
O2 Saturation: 69 %
PO2 VEN: 36 mmHg (ref 30.0–45.0)
TCO2: 28 mmol/L (ref 0–100)
pCO2, Ven: 43.1 mmHg — ABNORMAL LOW (ref 45.0–50.0)
pH, Ven: 7.404 — ABNORMAL HIGH (ref 7.250–7.300)

## 2014-09-04 LAB — CREATININE, SERUM
Creatinine, Ser: 4.17 mg/dL — ABNORMAL HIGH (ref 0.61–1.24)
GFR calc non Af Amer: 14 mL/min — ABNORMAL LOW (ref >60)
GFR, EST AFRICAN AMERICAN: 17 mL/min — AB (ref >60)

## 2014-09-04 LAB — POCT I-STAT 3, ART BLOOD GAS (G3+)
Acid-Base Excess: 3 mmol/L — ABNORMAL HIGH (ref 0.0–2.0)
BICARBONATE: 27.1 meq/L — AB (ref 20.0–24.0)
O2 Saturation: 95 %
PH ART: 7.428 (ref 7.350–7.450)
TCO2: 28 mmol/L (ref 0–100)
pCO2 arterial: 41 mmHg (ref 35.0–45.0)
pO2, Arterial: 72 mmHg — ABNORMAL LOW (ref 80.0–100.0)

## 2014-09-04 LAB — CBC
HCT: 28.5 % — ABNORMAL LOW (ref 39.0–52.0)
Hemoglobin: 9.2 g/dL — ABNORMAL LOW (ref 13.0–17.0)
MCH: 27.2 pg (ref 26.0–34.0)
MCHC: 32.3 g/dL (ref 30.0–36.0)
MCV: 84.3 fL (ref 78.0–100.0)
Platelets: 245 10*3/uL (ref 150–400)
RBC: 3.38 MIL/uL — ABNORMAL LOW (ref 4.22–5.81)
RDW: 20.6 % — AB (ref 11.5–15.5)
WBC: 8 10*3/uL (ref 4.0–10.5)

## 2014-09-04 LAB — MAGNESIUM: Magnesium: 2 mg/dL (ref 1.7–2.4)

## 2014-09-04 LAB — GLUCOSE, CAPILLARY
GLUCOSE-CAPILLARY: 73 mg/dL (ref 65–99)
Glucose-Capillary: 82 mg/dL (ref 65–99)
Glucose-Capillary: 98 mg/dL (ref 65–99)
Glucose-Capillary: 288 mg/dL — ABNORMAL HIGH (ref 65–99)
Glucose-Capillary: 135 mg/dL — ABNORMAL HIGH (ref 65–99)

## 2014-09-04 LAB — PROTIME-INR
INR: 1.14 (ref 0.00–1.49)
Prothrombin Time: 14.8 seconds (ref 11.6–15.2)

## 2014-09-04 SURGERY — RIGHT/LEFT HEART CATH AND CORONARY ANGIOGRAPHY

## 2014-09-04 MED ORDER — NITROGLYCERIN 1 MG/10 ML FOR IR/CATH LAB
INTRA_ARTERIAL | Status: DC | PRN
  Administered 2014-09-04: 11:00:00

## 2014-09-04 MED ORDER — HEPARIN SODIUM (PORCINE) 5000 UNIT/ML IJ SOLN
5000.0000 [IU] | Freq: Three times a day (TID) | INTRAMUSCULAR | Status: AC
  Administered 2014-09-04 – 2014-09-05 (×2): 5000 [IU] via SUBCUTANEOUS
  Filled 2014-09-04 (×2): qty 1

## 2014-09-04 MED ORDER — SODIUM CHLORIDE 0.9 % IV SOLN: 250.0000 mL | INTRAVENOUS | Status: DC | PRN

## 2014-09-04 MED ORDER — FENTANYL CITRATE (PF) 100 MCG/2ML IJ SOLN
INTRAMUSCULAR | Status: DC | PRN
  Administered 2014-09-04: 25 ug via INTRAVENOUS

## 2014-09-04 MED ORDER — SODIUM CHLORIDE 0.9 % IV SOLN: INTRAVENOUS | Status: DC

## 2014-09-04 MED ORDER — MIDAZOLAM HCL 2 MG/2ML IJ SOLN
INTRAMUSCULAR | Status: AC
  Filled 2014-09-04: qty 4

## 2014-09-04 MED ORDER — ASPIRIN 81 MG PO CHEW: 81.0000 mg | CHEWABLE_TABLET | ORAL | Status: DC

## 2014-09-04 MED ORDER — SODIUM CHLORIDE 0.9 % IJ SOLN: 3.0000 mL | Freq: Two times a day (BID) | INTRAMUSCULAR | Status: DC

## 2014-09-04 MED ORDER — SODIUM CHLORIDE 0.9 % IJ SOLN
3.0000 mL | Freq: Two times a day (BID) | INTRAMUSCULAR | Status: DC
  Administered 2014-09-04 – 2014-09-07 (×5): 3 mL via INTRAVENOUS

## 2014-09-04 MED ORDER — SODIUM CHLORIDE 0.9 % IJ SOLN
3.0000 mL | Freq: Two times a day (BID) | INTRAMUSCULAR | Status: DC
  Administered 2014-09-04: 3 mL via INTRAVENOUS

## 2014-09-04 MED ORDER — FENTANYL CITRATE (PF) 100 MCG/2ML IJ SOLN
INTRAMUSCULAR | Status: AC
  Filled 2014-09-04: qty 4

## 2014-09-04 MED ORDER — MIDAZOLAM HCL 2 MG/2ML IJ SOLN
INTRAMUSCULAR | Status: DC | PRN
Start: 2014-09-04 — End: 2014-09-04
  Administered 2014-09-04: 2 mg via INTRAVENOUS

## 2014-09-04 MED ORDER — IOHEXOL 350 MG/ML SOLN
INTRAVENOUS | Status: DC | PRN
  Administered 2014-09-04: 55 mL via INTRA_ARTERIAL

## 2014-09-04 MED ORDER — SODIUM CHLORIDE 0.9 % IV SOLN
INTRAVENOUS | Status: DC
  Administered 2014-09-04: 06:00:00 via INTRAVENOUS

## 2014-09-04 MED ORDER — SODIUM CHLORIDE 0.9 % IJ SOLN: 3.0000 mL | INTRAMUSCULAR | Status: DC | PRN

## 2014-09-04 MED ORDER — ASPIRIN 81 MG PO CHEW
81.0000 mg | CHEWABLE_TABLET | ORAL | Status: AC
  Administered 2014-09-04: 81 mg via ORAL
  Filled 2014-09-04: qty 1

## 2014-09-04 MED ORDER — HEPARIN (PORCINE) IN NACL 2-0.9 UNIT/ML-% IJ SOLN
INTRAMUSCULAR | Status: AC
  Filled 2014-09-04: qty 1000

## 2014-09-04 MED ORDER — LIDOCAINE HCL (PF) 1 % IJ SOLN
INTRAMUSCULAR | Status: AC
  Filled 2014-09-04: qty 30

## 2014-09-04 SURGICAL SUPPLY — 12 items
CATH INFINITI 5FR MULTPACK ANG (CATHETERS) ×2 IMPLANT
CATH SWAN GANZ 7F STRAIGHT (CATHETERS) ×2 IMPLANT
GLIDESHEATH SLEND SS 6F .021 (SHEATH) ×3 IMPLANT
KIT HEART LEFT (KITS) ×3 IMPLANT
KIT HEART RIGHT NAMIC (KITS) ×2 IMPLANT
PACK CARDIAC CATHETERIZATION (CUSTOM PROCEDURE TRAY) ×3 IMPLANT
SHEATH PINNACLE 5F 10CM (SHEATH) ×2 IMPLANT
SHEATH PINNACLE 7F 10CM (SHEATH) ×2 IMPLANT
SYR MEDRAD MARK V 150ML (SYRINGE) ×3 IMPLANT
TRANSDUCER W/STOPCOCK (MISCELLANEOUS) ×3 IMPLANT
WIRE EMERALD 3MM-J .035X150CM (WIRE) ×2 IMPLANT
WIRE SAFE-T 1.5MM-J .035X260CM (WIRE) ×3 IMPLANT

## 2014-09-04 NOTE — Interval H&P Note (Signed)
History and Physical Interval Note:  09/04/2014 10:14 AM  Bird E Burtt  has presented today for surgery, with the diagnosis of abnormal nuclear stress test  The various methods of treatment have been discussed with the patient and family. After consideration of risks, benefits and other options for treatment, the patient has consented to  Procedure(s): Right/Left Heart Cath and Coronary Angiography (N/A) as a surgical intervention .  [name] history has been reviewed, patient examined, no change in status, stable for surgery.  I have reviewed the patient's chart and labs.  Questions were answered to the patient's satisfaction.   Cath Lab Visit (complete for each Cath Lab visit)  Clinical Evaluation Leading to the Procedure:   ACS: No.  Non-ACS:    Anginal Classification: CCS IV  Anti-ischemic medical therapy: Minimal Therapy (1 class of medications)  Non-Invasive Test Results: No non-invasive testing performed  Prior CABG: No previous CABG        [name] 09/04/2014 10:14 AM

## 2014-09-04 NOTE — Progress Notes (Addendum)
Site area: rt fa and fv sheaths Site Prior to Removal:  Level   0   Pressure Applied For:   20 minutes Manual:   yes Patient Status During Pull:  Stable   Post Pull Site:  Level 0 Post Pull Instructions Given:  yes Post Pull Pulses Present: yes Dressing Applied:  tegbaderm Bedrest begins @ 1140 Comments:      0.  IV saline locked.

## 2014-09-04 NOTE — Progress Notes (Signed)
[facility] PROGRESS NOTE  Kyle [name] [id number] DOB: 02-07-1955 DOA: 08/20/2014 PCP: No primary care provider on file.     PULMONARY / CRITICAL CARE transfer to [facility] service /3W on 8/6 Assessment/Plan: Principal Problem:   Anasarca Active Problems:   Acute on chronic combined systolic and diastolic CHF, NYHA class 2   CKD (chronic kidney disease) stage 5, GFR less than 15 ml/min   Benign essential HTN   DM (diabetes mellitus), type 2, uncontrolled, with renal complications   Troponin level elevated   Anemia of chronic disease   Cardiorenal syndrome with renal failure   Ascites   Acute respiratory failure, unspecified whether with hypoxia or hypercapnia   Cardiac arrest   Pressure ulcer   Acute respiratory failure with hypoxia   NSVT (nonsustained ventricular tachycardia)   Debility    PEA cardiac arrestintra-operatively post catheter ( Tunneled cath for dialysis  .  Patient has cardiomyopathy, EF of 10% Cardiology following, continue Coreg twice a day and diuresis with hemodialysis Cardiology recommends left heart cath today, EP recommended cath and "exclusion of amyloid" EP to make further recommendations    Possible Aspiration Pneumonia - completing 7 day course of tx  Acute respiratory failure after cardiac arrest.pleural effusion rt?, pulm edema, gross overlaod  Currently on room air  End-stage renal disease- on CRRT from 7/27-8/3, now on hemodialysis, Had  IHD 8/4-8/6, 8/9, now 8/11. Will need OP HD  and perm access  prior to discharge- .awaiting out[facility] spot [facility]. [facility] has seen but wanted to wait for him to be more stable.VVS to revisit access as he is much more stable.  Anticipate future hemodialysis Tuesday Thursday Saturday  Nausea/diarrhea-if recurs will obtain abdominal KUB, rule out C. difficile  Diabetes mellitus-continue SSI,Accuchecks to qAC & HS  Acute encephalopathy after cardiac arrest - completely back to baseline,patient  did suffer an unwitnessed fall on 8/4. CT scan of the head/neck was negative for any fracture or obvious trauma. Incidentally found thyroid nodule on CT scan of the neck will need to be followed up as an outpatient.  Anemia of chronic disease- nephrology added aranesp, hemoglobin stable around 9-9.5  Cardiomyopathy - acute on chronic systolic heart failure - EF 10%,-  continue Coreg 12 BID (was on Coreg previously). Goal 25 BID. Started on hydralazine by cardiology . Hopefully improve with hemodialysis not on ACE due to ESRD   Code Status:      Code Status Orders        Start     Ordered   08/21/14 1649  Full code   Continuous     08/21/14 1652     Family Communication: family updated about patient's clinical progress Disposition Plan:  Physical therapy recommends home health, 24-hour supervision, anticipate discharge Thursday or Friday    Brief narrative: 59 yo male with PMHx of CKD Stage V (non-compliant with f/u), chronic combined CHF (EF 15%), HTN and T2DM initially admitted 7/26 to [facility] with acute on chronic CHF and acute on CKD with SCr 10.7. Pt was tx to [facility] for placement of tunneled HD cath for initiation of dialysis. He went to OR 7/27 for placement of Tunneled cath (concious sedation only) and intra-operatively (post catheter placement) had bradycardia and ultimately PEA arrest with approx 10 mins CPR before ROSC. No purposeful mental status post arrest. Tx to ICU and PCCM consulted.       Consultants:  Critical care  Nephrology  Cardiology  Vascular  surgery    Procedures: STUDIES:  7/27 paracentesis 7/28 Echo >> EF 10% 8/04 CT Head/Neck - negative for fracture or trauma  SIGNIFICANT EVENTS: 7/27 Cardiac arrest, intubated and to ICU, hypothermia 7/29 rewarmed 7/30 more agitated 8/03 extubated 8/04 unwitnessed fall  Antibiotics: Anti-infectives    Start     Dose/Rate Route Frequency Ordered Stop   08/29/14 1000  piperacillin-tazobactam  (ZOSYN) IVPB 2.25 g     2.25 g 100 mL/hr over 30 Minutes Intravenous 3 times per day 08/29/14 0855 08/31/14 0017   08/28/14 1200  piperacillin-tazobactam (ZOSYN) IVPB 3.375 g  Status:  Discontinued     3.375 g 100 mL/hr over 30 Minutes Intravenous 4 times per day 08/28/14 0905 08/28/14 0906   08/28/14 1100  piperacillin-tazobactam (ZOSYN) IVPB 3.375 g  Status:  Discontinued     3.375 g 100 mL/hr over 30 Minutes Intravenous Every 6 hours 08/28/14 0906 08/29/14 0855   08/24/14 1900  vancomycin (VANCOCIN) 1,500 mg in sodium chloride 0.9 % 500 mL IVPB  Status:  Discontinued     1,500 mg 250 mL/hr over 120 Minutes Intravenous Every 48 hours 08/22/14 1830 08/23/14 1332   08/24/14 1800  piperacillin-tazobactam (ZOSYN) IVPB 3.375 g  Status:  Discontinued     3.375 g 12.5 mL/hr over 240 Minutes Intravenous 4 times per day 08/24/14 1715 08/24/14 1719   08/24/14 1800  piperacillin-tazobactam (ZOSYN) IVPB 3.375 g  Status:  Discontinued     3.375 g 100 mL/hr over 30 Minutes Intravenous 4 times per day 08/24/14 1720 08/28/14 0905   08/24/14 1600  nafcillin 1 g in dextrose 5 % 50 mL IVPB  Status:  Discontinued     1 g 100 mL/hr over 30 Minutes Intravenous 6 times per day 08/24/14 1439 08/24/14 1715   08/24/14 1530  nafcillin injection 1 g  Status:  Discontinued     1 g Intravenous Every 4 hours 08/24/14 1428 08/24/14 1439   08/23/14 2100  vancomycin (VANCOCIN) IVPB 1000 mg/200 mL premix  Status:  Discontinued     1,000 mg 200 mL/hr over 60 Minutes Intravenous Every 24 hours 08/23/14 1340 08/24/14 1428   08/23/14 2000  vancomycin (VANCOCIN) IVPB 1000 mg/200 mL premix  Status:  Discontinued     1,000 mg 200 mL/hr over 60 Minutes Intravenous Every 24 hours 08/23/14 1333 08/23/14 1340   08/22/14 1900  vancomycin (VANCOCIN) 1,750 mg in sodium chloride 0.9 % 500 mL IVPB     1,750 mg 250 mL/hr over 120 Minutes Intravenous  Once 08/22/14 1826 08/22/14 2142   08/21/14 1353  ceFAZolin (ANCEF) IVPB 1 g/50 mL  premix    Comments:  Send with pt to OR   1 g 100 mL/hr over 30 Minutes Intravenous To [facility] 08/21/14 0837 08/21/14 1500         HPI/Subjective: mildly confused but will wake up. No chest pain or shortness of breath  Objective: Filed Vitals:   09/04/14 1051 09/04/14 1056 09/04/14 1101 09/04/14 1105  BP: 137/91 137/91 137/91 132/79  Pulse: 65 65 65 65  Temp:      TempSrc:      Resp: 12 12 12 18   Height:      Weight:      SpO2: 99% 0% 0% 95%    Intake/Output Summary (Last 24 hours) at 09/04/14 1127 Last data filed at 09/03/14 2300  Gross per 24 hour  Intake    240 ml  Output   3014 ml  Net  -2774  ml    Exam:  PE: General:Pleasant affect, NAD Skin:Warm and dry, brisk capillary refill HEENT:normocephalic, sclera clear, mucus membranes moist Heart:S1S2 RRR without murmur, gallup, rub or click Lungs:clear without rales, rhonchi, or wheezes [id number], non tender, + BS, do not palpate liver spleen or masses Ext:no lower ext edema, 2+ pedal pulses, 2+ radial pulses Neuro:alert and oriented X 3, MAE, follows commands, + facial symmetry Tele: SR with PVCs  Data Review   Micro Results No results found for this or any previous visit (from the past 240 hour(s)).  Radiology Reports Dg Chest 2 View  08/20/2014   CLINICAL DATA:  Lower extremity edema, CHF and anasarca. History of renal failure.  EXAM: CHEST  2 VIEW  COMPARISON:  01/14/2014 and 06/20/2008 chest radiographs  FINDINGS: Small-moderate bilateral pleural effusions and bibasilar atelectasis again noted.  Mild pulmonary vascular congestion is present.  The visualized cardiomediastinal silhouette is unchanged.  There is no evidence of pneumothorax.  No acute bony abnormalities are identified.  IMPRESSION: Small-moderate bilateral pleural effusions with bibasilar atelectasis.  Mild pulmonary vascular congestion.   Electronically Signed   By: Margarette Canada M.D.   On: 08/20/2014 15:01   Ct Head Wo  Contrast  08/29/2014   CLINICAL DATA:  Trauma. Unwitnessed fall. Confusion and agitation today. Initial encounter.  EXAM: CT HEAD WITHOUT CONTRAST  CT CERVICAL SPINE WITHOUT CONTRAST  TECHNIQUE: Multidetector CT imaging of the head and cervical spine was performed following the standard protocol without intravenous contrast. Multiplanar CT image reconstructions of the cervical spine were also generated.  COMPARISON:  None.  FINDINGS: CT HEAD FINDINGS  Study is mildly motion degraded. Punctate hyperdense focus near the foramen of Magendie is consistent with calcification rather than hemorrhage. There is no evidence of acute intracranial hemorrhage. Ventricles and sulci are normal in size for age. There is no evidence of acute large territory infarct, mass, midline shift, or extra-axial fluid collection. Periventricular white-matter hypodensities are nonspecific but compatible with mild chronic small vessel ischemic disease.  Orbits are unremarkable. The visualized mastoid air cells are clear. There is mild right frontal and bilateral ethmoid sinus mucosal thickening. No skull fracture is identified.  CT CERVICAL SPINE FINDINGS  There is mild motion artifact through the mid to lower cervical spine despite repeating the study. There is straightening of the normal cervical lordosis. There is no listhesis. Moderate to severe disc space narrowing is present from C4-5 to C6-7 with associated degenerative endplate changes. No cervical spine fracture is identified. Moderate spinal stenosis is suspected at C4-5 and C5-6 due to broad-based posterior disc osteophyte complexes, and there is moderate to severe neural foraminal stenosis on the left at C4-5 and on the right at C5-6.  Anasarca is noted. A right jugular central venous catheter is partially visualized. A 2.5 cm hypoattenuating nodule is noted in the right thyroid lobe. Bilateral pleural effusions are partially visualized. Skin staples are noted in the right lower  neck.  IMPRESSION: 1. Mildly motion degraded examination. 2. No evidence of acute intracranial abnormality. 3. No acute osseous abnormality identified in the cervical spine. Multilevel cervical disc degeneration. 4. Anasarca and bilateral pleural effusions, incompletely visualized. 5. 2.5 cm right thyroid nodule. Further evaluation with outpatient thyroid ultrasound is suggested.   Electronically Signed   By: Logan Bores   On: 08/29/2014 15:32   Ct Cervical Spine Wo Contrast  08/29/2014   CLINICAL DATA:  Trauma. Unwitnessed fall. Confusion and agitation today. Initial encounter.  EXAM: CT HEAD WITHOUT CONTRAST  CT CERVICAL SPINE WITHOUT CONTRAST  TECHNIQUE: Multidetector CT imaging of the head and cervical spine was performed following the standard protocol without intravenous contrast. Multiplanar CT image reconstructions of the cervical spine were also generated.  COMPARISON:  None.  FINDINGS: CT HEAD FINDINGS  Study is mildly motion degraded. Punctate hyperdense focus near the foramen of Magendie is consistent with calcification rather than hemorrhage. There is no evidence of acute intracranial hemorrhage. Ventricles and sulci are normal in size for age. There is no evidence of acute large territory infarct, mass, midline shift, or extra-axial fluid collection. Periventricular white-matter hypodensities are nonspecific but compatible with mild chronic small vessel ischemic disease.  Orbits are unremarkable. The visualized mastoid air cells are clear. There is mild right frontal and bilateral ethmoid sinus mucosal thickening. No skull fracture is identified.  CT CERVICAL SPINE FINDINGS  There is mild motion artifact through the mid to lower cervical spine despite repeating the study. There is straightening of the normal cervical lordosis. There is no listhesis. Moderate to severe disc space narrowing is present from C4-5 to C6-7 with associated degenerative endplate changes. No cervical spine fracture is  identified. Moderate spinal stenosis is suspected at C4-5 and C5-6 due to broad-based posterior disc osteophyte complexes, and there is moderate to severe neural foraminal stenosis on the left at C4-5 and on the right at C5-6.  Anasarca is noted. A right jugular central venous catheter is partially visualized. A 2.5 cm hypoattenuating nodule is noted in the right thyroid lobe. Bilateral pleural effusions are partially visualized. Skin staples are noted in the right lower neck.  IMPRESSION: 1. Mildly motion degraded examination. 2. No evidence of acute intracranial abnormality. 3. No acute osseous abnormality identified in the cervical spine. Multilevel cervical disc degeneration. 4. Anasarca and bilateral pleural effusions, incompletely visualized. 5. 2.5 cm right thyroid nodule. Further evaluation with outpatient thyroid ultrasound is suggested.   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  PROCEDURE: An ultrasound guided paracentesis was thoroughly discussed with the patient and questions answered. The benefits, risks, alternatives and complications were also discussed. The patient understands and wishes to proceed with the procedure. Written consent was obtained.  Ultrasound was performed to localize and mark an adequate pocket of fluid in the left lower quadrant of the abdomen. The area was then prepped and draped in the normal sterile fashion. 1% Lidocaine was used for local anesthesia. Under ultrasound guidance a 19 gauge Yueh catheter was introduced. Paracentesis was performed. The catheter was removed and a dressing applied.  COMPLICATIONS: None.  FINDINGS: A total of approximately 2.5 liters of clear yellow fluid was removed. A fluid sample was sent for laboratory  analysis.  IMPRESSION: Successful ultrasound guided paracentesis yielding 2.5 liters of ascites.  Read by:  Gareth Eagle, PA-C   Electronically Signed   By: Sandi Mariscal M.D.   On: 08/21/2014 14:24   Dg Chest Port 1 View  08/26/2014   CLINICAL DATA:  CHF, acute respiratory failure, anasarca  EXAM: PORTABLE CHEST - 1 VIEW  COMPARISON:  Portable chest x-ray of August 25, 2014  FINDINGS: The lungs remain hypoinflated. There are persistent bilateral pleural effusions as well bibasilar atelectasis or infiltrate. There is no pneumothorax. The cardiac silhouette is largely obscured. The  central pulmonary vascularity is more distinct today. The endotracheal tube tip lies 2 cm above the carina. The esophagogastric tube tip projects below the inferior margin of the image. The left internal jugular venous catheter tip projects over the midportion of the SVC. The large caliber dual-lumen right internal jugular catheter projects over lower third of the SVC.  IMPRESSION: Slight interval improvement in the appearance of the will pulmonary interstitium and central pulmonary vascularity may reflect improving pulmonary edema. Bilateral pleural effusions and bibasilar atelectasis or infiltrate persists. The support tubes are in reasonable position.   Electronically Signed   By: David  Martinique M.D.   On: 08/26/2014 07:12   Dg Chest Port 1 View  08/25/2014   CLINICAL DATA:  Cardiac arrest  EXAM: PORTABLE CHEST - 1 VIEW  COMPARISON:  Radiograph 08/24/2014  FINDINGS: Endotracheal tube is approximately 1 cm from the carina. LEFT central venous line and NG tube are unchanged. There are low lung volumes and basilar atelectasis. Small effusions noted. Central venous congestion present. No pneumothorax.  IMPRESSION: 1. Endotracheal tube is relatively low approximately 1 cm from carina. No significant change. 2. Low lung volumes, bibasilar atelectasis and small effusions.   Electronically Signed   By: Suzy Bouchard M.D.   On: 08/25/2014  09:09   Dg Chest Port 1 View  08/24/2014   CLINICAL DATA:  Acute respiratory failure, hypoxia. Exchange of endotracheal tube.  EXAM: PORTABLE CHEST - 1 VIEW  COMPARISON:  08/24/2014  FINDINGS: Endotracheal tube is 2.5 cm above the carina. Left central line and right dialysis catheter is well is NG tube are unchanged.  Bilateral perihilar and lower lobe opacities with layering effusions, unchanged.  IMPRESSION: Endotracheal tube 2.5 cm above the carina. Otherwise no significant change.   Electronically Signed   By: Rolm Baptise M.D.   On: 08/24/2014 18:29   Dg Chest Port 1 View  08/24/2014   CLINICAL DATA:  Respiratory failure  EXAM: PORTABLE CHEST - 1 VIEW  COMPARISON:  08/23/2014  FINDINGS: Layering moderate right pleural effusion. Small left pleural effusion.  Associated bilateral lower lobe opacities, likely atelectasis.  Pulmonary vascular congestion without frank interstitial edema.  Endotracheal tube terminates 5.5 cm above the carina.  The heart is normal in size.  Right IJ dual lumen dialysis catheter with its tip in the right atrium. Left IJ venous catheter with its tip in the mid SVC.  Enteric tube courses below the diaphragm.  IMPRESSION: Pulmonary vascular congestion without frank interstitial edema.  Layering moderate right pleural effusion. Small left pleural effusion.  Associated bilateral lower lobe opacities, likely atelectasis.  Endotracheal tube terminates 5.5 cm above the carina. Additional support apparatus as above.   Electronically Signed   By: Julian Hy M.D.   On: 08/24/2014 08:17   Dg Chest Port 1 View  08/23/2014   CLINICAL DATA:  Respiratory difficulty  EXAM: PORTABLE CHEST - 1 VIEW  COMPARISON:  08/21/2014  FINDINGS: Tubular devices are stable. Normal heart size. Bilateral pleural effusions are stable. Central airspace disease is stable. No pneumothorax.  IMPRESSION: Stable bilateral pleural effusions and bilateral central airspace disease.   Electronically Signed   By:  Marybelle Killings M.D.   On: 08/23/2014 07:44   Dg Chest Port 1 View  08/21/2014   CLINICAL DATA:  Hypoxia  EXAM: PORTABLE CHEST - 1 VIEW  COMPARISON:  August 20, 2014  FINDINGS: Endotracheal tube tip is 2.9 cm above the carina. Left jugular catheter tip is in the superior vena cava  just beyond the junction with the left innominate vein. Dual-lumen catheter tip is in the right atrium. Nasogastric tube tip and side port are below the diaphragm. No pneumothorax. There is cardiomegaly with bilateral effusions and alveolar edema bilaterally. No adenopathy.  IMPRESSION: Congestive heart failure. Tube and catheter positions as described without pneumothorax.   Electronically Signed   By: Lowella Grip III M.D.   On: 08/21/2014 18:41   Dg Abd Portable 1v  08/25/2014   CLINICAL DATA:  Ileus.  EXAM: PORTABLE ABDOMEN - 1 VIEW  COMPARISON:  08/21/2014  FINDINGS: NG tube remains in the stomach. There is a nonobstructive bowel gas pattern. No gaseous distention to suggest ileus. No free air or visible organomegaly or suspicious calcification. No acute bony abnormality.  IMPRESSION: No evidence of obstruction or ileus.   Electronically Signed   By: Rolm Baptise M.D.   On: 08/25/2014 12:43   Dg Abd Portable 1v  08/21/2014   CLINICAL DATA:  OG tube placement  EXAM: PORTABLE ABDOMEN - 1 VIEW  COMPARISON:  None.  FINDINGS: NG tube with tip in the gastric body. Paucity of gas in the abdomen. Bilateral pleural effusions.  IMPRESSION: NG tube with tip in the stomach.   Electronically Signed   By: Suzy Bouchard M.D.   On: 08/21/2014 18:42   Dg Fluoro Guide Cv Line-no Report  08/21/2014   CLINICAL DATA:    FLOURO GUIDE CV LINE  Fluoroscopy was utilized by the requesting physician.  No radiographic  interpretation.      CBC  Recent Labs Lab 08/31/14 0724 09/01/14 0415 09/02/14 0426 09/03/14 0505 09/04/14 0425  WBC 9.2 9.6 9.4 9.2 10.4  HGB 8.9* 9.5* 9.5* 9.5* 9.4*  HCT 27.8* 30.1* 29.3* 28.9* 29.3*  PLT 264 287  288 291 265  MCV 83.2 84.1 84.9 83.3 85.4  MCH 26.6 26.5 27.5 27.4 27.4  MCHC 32.0 31.6 32.4 32.9 32.1  RDW 19.4* 19.9* 20.5* 20.4* 20.9*  LYMPHSABS 0.9 0.8 1.1 1.4 1.0  MONOABS 0.8 1.0 1.0 0.6 0.7  EOSABS 0.1 0.0 0.1 0.2 0.1  BASOSABS 0.0 0.0 0.0 0.0 0.0    Chemistries   Recent Labs Lab 08/31/14 0724 09/01/14 0415 09/02/14 0426 09/03/14 0505 09/04/14 0425  NA 139 139 139 137 138  K 3.8 3.7 3.9 3.8 4.1  CL 103 102 104 101 102  CO2 22 26 24 24 28   GLUCOSE 88 99 73 72 107*  BUN 48* 26* 39* 45* 24*  CREATININE 4.12* 3.20* 4.19* 5.03* 3.64*  CALCIUM 8.4* 8.4* 8.2* 7.9* 7.6*  MG 2.3 2.2 2.1 2.0 2.0   ------------------------------------------------------------------------------------------------------------------ estimated creatinine clearance is 24 mL/min (by C-G formula based on Cr of 3.64). ------------------------------------------------------------------------------------------------------------------ No results for input(s): HGBA1C in the last 72 hours. ------------------------------------------------------------------------------------------------------------------ No results for input(s): CHOL, HDL, LDLCALC, TRIG, CHOLHDL, LDLDIRECT in the last 72 hours. ------------------------------------------------------------------------------------------------------------------ No results for input(s): TSH, T4TOTAL, T3FREE, THYROIDAB in the last 72 hours.  Invalid input(s): FREET3 ------------------------------------------------------------------------------------------------------------------ No results for input(s): VITAMINB12, FOLATE, FERRITIN, TIBC, IRON, RETICCTPCT in the last 72 hours.  Coagulation profile  Recent Labs Lab 09/04/14 0425  INR 1.14    No results for input(s): DDIMER in the last 72 hours.  Cardiac Enzymes No results for input(s): CKMB, TROPONINI, MYOGLOBIN in the last 168 hours.  Invalid input(s):  CK ------------------------------------------------------------------------------------------------------------------ Invalid input(s): POCBNP   CBG:  Recent Labs Lab 09/03/14 0640 09/03/14 1116 09/03/14 2122 09/04/14 0725 09/04/14 1117  GLUCAP 78 162* 335* 82 73  Studies: No results found.    Lab Results  Component Value Date   HGBA1C 7.6* 08/20/2014   HGBA1C 6.5* 01/13/2014   Lab Results  Component Value Date   CREATININE 3.64* 09/04/2014       Scheduled Meds: . [MAR Hold] antiseptic oral rinse  7 mL Mouth Rinse BID  . [MAR Hold] carvedilol  12.5 mg Oral BID WC  . [MAR Hold] darbepoetin (ARANESP) injection - DIALYSIS  150 mcg Intravenous Q Tue-HD  . [MAR Hold] doxercalciferol  2 mcg Intravenous Q T,Th,Sa-HD  . [MAR Hold] heparin  5,000 Units Subcutaneous 3 times per day  . heparin  5,000 Units Subcutaneous 3 times per day  . [MAR Hold] hydrALAZINE  25 mg Oral QID  . [MAR Hold] insulin aspart  0-20 Units Subcutaneous TID AC & HS  . [MAR Hold] multivitamin  1 tablet Oral QHS  . [MAR Hold] sodium chloride  10-40 mL Intracatheter Q12H  . sodium chloride  3 mL Intravenous Q12H  . sodium chloride  3 mL Intravenous Q12H   Continuous Infusions: . sodium chloride Stopped (08/24/14 0300)  . sodium chloride    . sodium chloride 10 mL/hr at 09/04/14 0559    Principal Problem:   Anasarca Active Problems:   Acute on chronic combined systolic and diastolic CHF, NYHA class 2   CKD (chronic kidney disease) stage 5, GFR less than 15 ml/min   Benign essential HTN   DM (diabetes mellitus), type 2, uncontrolled, with renal complications   Troponin level elevated   Anemia of chronic disease   Cardiorenal syndrome with renal failure   Ascites   Acute respiratory failure, unspecified whether with hypoxia or hypercapnia   Cardiac arrest   Pressure ulcer   Acute respiratory failure with hypoxia   NSVT (nonsustained ventricular tachycardia)   Debility    Time  spent: 45 minutes   [facility] Pager [phone]. If 7PM-7AM, please contact night-coverage at [email or web address], password [facility] 09/04/2014, 11:27 AM  LOS: 15 days

## 2014-09-04 NOTE — Progress Notes (Signed)
Transferred in bed back to [phone]

## 2014-09-04 NOTE — Progress Notes (Signed)
Patient Name: Kyle [name] Date of Encounter: 09/04/2014   SUBJECTIVE  Feels better, denies chest pain, sob or palpitation.   CURRENT MEDS . antiseptic oral rinse  7 mL Mouth Rinse BID  . carvedilol  12.5 mg Oral BID WC  . darbepoetin (ARANESP) injection - DIALYSIS  150 mcg Intravenous Q Tue-HD  . doxercalciferol  2 mcg Intravenous Q T,Th,Sa-HD  . heparin  5,000 Units Subcutaneous 3 times per day  . hydrALAZINE  25 mg Oral QID  . insulin aspart  0-20 Units Subcutaneous TID AC & HS  . multivitamin  1 tablet Oral QHS  . sodium chloride  10-40 mL Intracatheter Q12H  . sodium chloride  3 mL Intravenous Q12H    OBJECTIVE  Filed Vitals:   09/03/14 1900 09/03/14 1920 09/03/14 2256 09/04/14 0641  BP: 135/98 145/90 183/94 139/92  Pulse: 75 73 79 83  Temp:  97.1 F (36.2 C)  99.1 F (37.3 C)  TempSrc:  Oral  Oral  Resp: 16 12  18   Height:      Weight:      SpO2:    100%    Intake/Output Summary (Last 24 hours) at 09/04/14 0915 Last data filed at 09/03/14 2300  Gross per 24 hour  Intake    240 ml  Output   3014 ml  Net  -2774 ml   Filed Weights   08/31/14 1811 09/02/14 0400 09/03/14 0523  Weight: 176 lb 5.9 oz (80 kg) 173 lb 15.1 oz (78.9 kg) 173 lb 1 oz (78.5 kg)    PHYSICAL EXAM  General: Pleasant, NAD. Neuro: Alert and oriented X 3. Moves all extremities spontaneously. Psych: Normal affect. HEENT:  Normal  Neck: Supple without bruits or JVD. Rt IJ Permcath. Lungs:  Resp regular and unlabored, CTA. Heart: RRR no s3, s4, or murmurs. Abdomen: Soft, non-tender, non-distended, BS + x 4.  Extremities: No clubbing, cyanosis or edema. DP/PT/Radials 2+ and equal bilaterally.  Accessory Clinical Findings  CBC  Recent Labs  09/03/14 0505 09/04/14 0425  WBC 9.2 10.4  NEUTROABS 7.0 8.5*  HGB 9.5* 9.4*  HCT 28.9* 29.3*  MCV 83.3 85.4  PLT 291 [id number]   Basic Metabolic Panel  Recent Labs  09/03/14 0505 09/04/14 0425  NA 137 138  K 3.8 4.1  CL 101 102    CO2 24 28  GLUCOSE 72 107*  BUN 45* 24*  CREATININE 5.03* 3.64*  CALCIUM 7.9* 7.6*  MG 2.0 2.0  PHOS 3.6 2.4*   Liver Function Tests  Recent Labs  09/03/14 0505 09/04/14 0425  ALBUMIN 2.0* 2.0*    TELE  SR with PVCs.   Radiology/Studies ECHO; 08/22/14 Study Conclusions  - Left ventricle: Myocardium with speckled appearance, consider cardiac amyloidosis (versus ESRD). Smoke noted in LV. The cavity size was normal. Wall thickness was increased in a pattern of severe LVH. The estimated ejection fraction was 10%. Diffuse hypokinesis. Doppler parameters are consistent with abnormal left ventricular relaxation (grade 1 diastolic dysfunction). - Aortic valve: There was no stenosis. There was mild regurgitation. - Mitral valve: There was no significant regurgitation. - Left atrium: The atrium was mildly dilated. - Right ventricle: The cavity size was mildly dilated. Systolic function was severely reduced. - Right atrium: The atrium was mildly dilated. - Atrial septum: There appeared to be left to right flow across the interatrial septum, ?PFO but not fully visualized. - Pulmonary arteries: No complete TR doppler jet so unable to estimate PA systolic pressure. - Systemic  veins: IVC measured 1.6 cm with < 50% respirophasic variation, suggesting RA pressure 8 mmHg. - Pericardium, extracardiac: Large left pleural effusion. A small circumferential pericardial effusion was identified.  Impressions:  - Normal LV size with severe LV hypertrophy. Speckled myocardium suggests possibility of cardiac amyloidosis (versus ESRD). EF 10% with diffuse hypokinesis, smoke noted in the LV. Mildly dilated RV with severe systolic dysfunction. Large left pleural effusion, small pericardial effusion  ASSESSMENT AND PLAN  1. Cardiomyopathy type undetermined probable hypertensive but yet to be determined, possible cardiac cath Wednesday, EF 10%. Will discuss  with MD.  2. Acute on chronic systolic heart failure compensated - EF 10% on echo. Continue Coreg, not on ACE due to ESRD 3. End-stage renal disease on hemodialysis -  plan HD for tomorrow.  VVS will reassess for access prior to discharge. Creatinine of 3.64. 4. Poorly controlled hypertension- BP improved on hydralazine , still elevated at time - hopefully improves after HD 5. Cardiac arrest PEA intra-operatively post catheter ( Tunneled cath for dialysis)- He is post artic sun. EP recommended cath and "exclusion is amyloid raised by the echocardiogram although there is no low-voltage on the ECG. The presence of amyloid would inform therapeutic options as well as limitations. Hence, I think medical therapy and elucidation as to the potential presence of amyloid would make sense. African-American males or or transthyretin amyloid and this can be assessed by genetic testing. Fat-pad biopsies and other tools , might also be appropriate." Further management per primary. Cath today.  6. Anemia of chronic disease- nephrology added aranesp  Signed, Bhagat,Bhavinkumar PA-C Pager [phone] Cardiac catheterization today showed single-vessel obstructive coronary disease involving a nondominant right coronary artery.  There was moderate pulmonary hypertension with normal left ventricular filling pressures.  Medical management was recommended.

## 2014-09-04 NOTE — Progress Notes (Signed)
S: No new CO  Tolerated HD yest well O:BP 139/92 mmHg  Pulse 83  Temp(Src) 99.1 F (37.3 C) (Oral)  Resp 18  Ht 6' (1.829 m)  Wt 78.5 kg (173 lb 1 oz)  BMI 23.47 kg/m2  SpO2 100%  Intake/Output Summary (Last 24 hours) at 09/04/14 0716 Last data filed at 09/03/14 2300  Gross per 24 hour  Intake    480 ml  Output   3014 ml  Net  -2534 ml   Weight change:  [id number] and alert CVS:RRR Resp:Decreased BS bases Abd:+ BS NTND Ext: No edema NEURO: CNI Ox3, no asterixis Rt IJ Permcath   . antiseptic oral rinse  7 mL Mouth Rinse BID  . carvedilol  12.5 mg Oral BID WC  . darbepoetin (ARANESP) injection - DIALYSIS  150 mcg Intravenous Q Tue-HD  . doxercalciferol  2 mcg Intravenous Q T,Th,Sa-HD  . heparin  5,000 Units Subcutaneous 3 times per day  . hydrALAZINE  25 mg Oral QID  . insulin aspart  0-20 Units Subcutaneous TID AC & HS  . multivitamin  1 tablet Oral QHS  . sodium chloride  10-40 mL Intracatheter Q12H  . sodium chloride  3 mL Intravenous Q12H   No results found. BMET    Component Value Date/Time   NA 138 09/04/2014 0425   K 4.1 09/04/2014 0425   CL 102 09/04/2014 0425   CO2 28 09/04/2014 0425   GLUCOSE 107* 09/04/2014 0425   BUN 24* 09/04/2014 0425   CREATININE 3.64* 09/04/2014 0425   CALCIUM 7.6* 09/04/2014 0425   GFRNONAA 17* 09/04/2014 0425   GFRAA 20* 09/04/2014 0425   CBC    Component Value Date/Time   WBC 10.4 09/04/2014 0425   RBC 3.43* 09/04/2014 0425   RBC 3.59* 01/15/2014 0415   HGB 9.4* 09/04/2014 0425   HCT 29.3* 09/04/2014 0425   PLT 265 09/04/2014 0425   MCV 85.4 09/04/2014 0425   MCH 27.4 09/04/2014 0425   MCHC 32.1 09/04/2014 0425   RDW 20.9* 09/04/2014 0425   LYMPHSABS 1.0 09/04/2014 0425   MONOABS 0.7 09/04/2014 0425   EOSABS 0.1 09/04/2014 0425   BASOSABS 0.0 09/04/2014 0425     Assessment:  1. New ESRD, awaiting outpt spot at [facility] 2. Anemia on aranesp 3. Sec HPTH on hectorol   Plan: 1. For heart cath today 2.   HD tomorrow   [name] Nurse T

## 2014-09-04 NOTE — H&P (View-Only) (Signed)
Patient Name: Kyle [name] Date of Encounter: 09/03/2014   SUBJECTIVE  Feels better, denies chest pain, sob or palpitation.   CURRENT MEDS . antiseptic oral rinse  7 mL Mouth Rinse BID  . carvedilol  12.5 mg Oral BID WC  . darbepoetin (ARANESP) injection - DIALYSIS  150 mcg Intravenous Q Tue-HD  . doxercalciferol  2 mcg Intravenous Q T,Th,Sa-HD  . heparin  5,000 Units Subcutaneous 3 times per day  . hydrALAZINE  25 mg Oral QID  . insulin aspart  0-20 Units Subcutaneous TID AC & HS  . multivitamin  1 tablet Oral QHS  . sodium chloride  10-40 mL Intracatheter Q12H    OBJECTIVE  Filed Vitals:   09/02/14 1202 09/02/14 2019 09/02/14 2225 09/03/14 0523  BP: 153/86 122/64 152/97 157/98  Pulse: 77 62  74  Temp: 97.8 F (36.6 C) 98 F (36.7 C)  98.8 F (37.1 C)  TempSrc: Oral Oral  Oral  Resp: 18 18  18   Height:      Weight:    173 lb 1 oz (78.5 kg)  SpO2: 100% 100%  100%    Intake/Output Summary (Last 24 hours) at 09/03/14 0800 Last data filed at 09/03/14 0753  Gross per 24 hour  Intake    720 ml  Output      0 ml  Net    720 ml   Filed Weights   08/31/14 1811 09/02/14 0400 09/03/14 0523  Weight: 176 lb 5.9 oz (80 kg) 173 lb 15.1 oz (78.9 kg) 173 lb 1 oz (78.5 kg)    PHYSICAL EXAM  General: Pleasant, NAD. Neuro: Alert and oriented X 3. Moves all extremities spontaneously. Psych: Normal affect. HEENT:  Normal  Neck: Supple without bruits or JVD. Lungs:  Resp regular and unlabored, CTA. Heart: RRR no s3, s4, or murmurs. Abdomen: Soft, non-tender, non-distended, BS + x 4.  Extremities: No clubbing, cyanosis or edema. DP/PT/Radials 2+ and equal bilaterally.  Accessory Clinical Findings  CBC  Recent Labs  09/02/14 0426 09/03/14 0505  WBC 9.4 9.2  NEUTROABS 7.3 7.0  HGB 9.5* 9.5*  HCT 29.3* 28.9*  MCV 84.9 83.3  PLT 288 [id number]   Basic Metabolic Panel  Recent Labs  09/02/14 0426 09/03/14 0505  NA 139 137  K 3.9 3.8  CL 104 101  CO2 24 24    GLUCOSE 73 72  BUN 39* 45*  CREATININE 4.19* 5.03*  CALCIUM 8.2* 7.9*  MG 2.1 2.0  PHOS 3.3 3.6   Liver Function Tests  Recent Labs  09/02/14 0426 09/03/14 0505  ALBUMIN 2.1* 2.0*    TELE  SR with PVCs.   Radiology/Studies ECHO; 08/22/14 Study Conclusions  - Left ventricle: Myocardium with speckled appearance, consider cardiac amyloidosis (versus ESRD). Smoke noted in LV. The cavity size was normal. Wall thickness was increased in a pattern of severe LVH. The estimated ejection fraction was 10%. Diffuse hypokinesis. Doppler parameters are consistent with abnormal left ventricular relaxation (grade 1 diastolic dysfunction). - Aortic valve: There was no stenosis. There was mild regurgitation. - Mitral valve: There was no significant regurgitation. - Left atrium: The atrium was mildly dilated. - Right ventricle: The cavity size was mildly dilated. Systolic function was severely reduced. - Right atrium: The atrium was mildly dilated. - Atrial septum: There appeared to be left to right flow across the interatrial septum, ?PFO but not fully visualized. - Pulmonary arteries: No complete TR doppler jet so unable to estimate PA systolic pressure. -  Systemic veins: IVC measured 1.6 cm with < 50% respirophasic variation, suggesting RA pressure 8 mmHg. - Pericardium, extracardiac: Large left pleural effusion. A small circumferential pericardial effusion was identified.  Impressions:  - Normal LV size with severe LV hypertrophy. Speckled myocardium suggests possibility of cardiac amyloidosis (versus ESRD). EF 10% with diffuse hypokinesis, smoke noted in the LV. Mildly dilated RV with severe systolic dysfunction. Large left pleural effusion, small pericardial effusion  ASSESSMENT AND PLAN  1. Cardiomyopathy type undetermined probable hypertensive but yet to be determined, possible cardiac cath Wednesday, EF 10%. Will discuss with MD.  2.  Acute on chronic systolic heart failure compensated - EF 10% on echo. Continue Coreg, not on ACE due to ESRD 3. End-stage renal disease on hemodialysis - HD today, VVS will reassess for access prior to discharge.  4. Poorly controlled hypertension- BP improved on hydralazine , still elevated at time - hopefully improves after HD 5. Cardiac arrest PEA intra-operatively post catheter ( Tunneled cath for dialysis)- He is post artic sun. Pending final EP recommendation.  6. Anemia of chronic disease- nephrology added aranesp  Signed, Bhagat,Bhavinkumar PA-C Pager [phone]  The patient has not been having any chest discomfort.  He denies shortness of breath.  EP recommendation is pending.  He is post PEA cardiac arrest.  Lungs are clear.  The heart reveals no murmur gallop or rub. He will have dialysis today. Discussed with Dr. Caryl Comes.  Will plan for left heart cath Wednesday.

## 2014-09-05 ENCOUNTER — Encounter (HOSPITAL_COMMUNITY): Payer: Self-pay | Admitting: Cardiology

## 2014-09-05 LAB — CBC WITH DIFFERENTIAL/PLATELET
BASOS ABS: 0 10*3/uL (ref 0.0–0.1)
Basophils Relative: 0 % (ref 0–1)
Eosinophils Absolute: 0.1 10*3/uL (ref 0.0–0.7)
Eosinophils Relative: 1 % (ref 0–5)
HEMATOCRIT: 27.7 % — AB (ref 39.0–52.0)
Hemoglobin: 8.8 g/dL — ABNORMAL LOW (ref 13.0–17.0)
LYMPHS ABS: 1.3 10*3/uL (ref 0.7–4.0)
Lymphocytes Relative: 13 % (ref 12–46)
MCH: 27.3 pg (ref 26.0–34.0)
MCHC: 31.8 g/dL (ref 30.0–36.0)
MCV: 86 fL (ref 78.0–100.0)
Monocytes Absolute: 0.7 10*3/uL (ref 0.1–1.0)
Monocytes Relative: 7 % (ref 3–12)
NEUTROS ABS: 7.9 10*3/uL — AB (ref 1.7–7.7)
Neutrophils Relative %: 79 % — ABNORMAL HIGH (ref 43–77)
Platelets: 249 10*3/uL (ref 150–400)
RBC: 3.22 MIL/uL — ABNORMAL LOW (ref 4.22–5.81)
RDW: 21.2 % — AB (ref 11.5–15.5)
WBC: 10 10*3/uL (ref 4.0–10.5)

## 2014-09-05 LAB — RENAL FUNCTION PANEL
Albumin: 2 g/dL — ABNORMAL LOW (ref 3.5–5.0)
Anion gap: 12 (ref 5–15)
BUN: 32 mg/dL — AB (ref 6–20)
CHLORIDE: 101 mmol/L (ref 101–111)
CO2: 25 mmol/L (ref 22–32)
CREATININE: 4.81 mg/dL — AB (ref 0.61–1.24)
Calcium: 7.6 mg/dL — ABNORMAL LOW (ref 8.9–10.3)
GFR calc Af Amer: 14 mL/min — ABNORMAL LOW (ref >60)
GFR calc non Af Amer: 12 mL/min — ABNORMAL LOW (ref >60)
GLUCOSE: 104 mg/dL — AB (ref 65–99)
Phosphorus: 2.7 mg/dL (ref 2.5–4.6)
Potassium: 4.3 mmol/L (ref 3.5–5.1)
Sodium: 138 mmol/L (ref 135–145)

## 2014-09-05 LAB — MAGNESIUM: Magnesium: 1.9 mg/dL (ref 1.7–2.4)

## 2014-09-05 LAB — GLUCOSE, CAPILLARY
GLUCOSE-CAPILLARY: 190 mg/dL — AB (ref 65–99)
GLUCOSE-CAPILLARY: 144 mg/dL — AB (ref 65–99)

## 2014-09-05 MED ORDER — DOXERCALCIFEROL 4 MCG/2ML IV SOLN
INTRAVENOUS | Status: AC
  Filled 2014-09-05: qty 2

## 2014-09-05 MED ORDER — HEPARIN SODIUM (PORCINE) 5000 UNIT/ML IJ SOLN
5000.0000 [IU] | Freq: Three times a day (TID) | INTRAMUSCULAR | Status: DC
  Administered 2014-09-07 (×2): 5000 [IU] via SUBCUTANEOUS
  Filled 2014-09-05 (×2): qty 1

## 2014-09-05 MED ORDER — NEPRO/CARBSTEADY PO LIQD
237.0000 mL | Freq: Two times a day (BID) | ORAL | Status: DC
  Administered 2014-09-05 – 2014-09-07 (×3): 237 mL via ORAL
  Filled 2014-09-05 (×8): qty 237

## 2014-09-05 NOTE — Progress Notes (Signed)
   Daily Progress Note  Assessment/Planning: S/p TDC complicated by PEA arrest, s/p cardiac cath yesterday   Discussed placement of AVG in this patient.  Pt is reluctant to proceed given recent cardiac arrest during [facility]  In my recent experiences, Cardiology has recommended delaying access placement for at least one month after episodes of cardiac arrest  Will need their clearance to proceed to clear anesthesia   Subjective  - 1 Day Post-Op  Feeling better  Objective Filed Vitals:   09/04/14 1354 09/04/14 1424 09/04/14 2143 09/05/14 0603  BP: 129/73 140/81 126/67 152/75  Pulse: 72 69 72 79  Temp:   98.6 F (37 C) 98.4 F (36.9 C)  TempSrc:   Oral Oral  Resp: 30 22 18 18   Height:      Weight:      SpO2: 99% 98% 100% 100%    Intake/Output Summary (Last 24 hours) at 09/05/14 0904 Last data filed at 09/04/14 1300  Gross per 24 hour  Intake    120 ml  Output      0 ml  Net    120 ml   GEN Non-toxic, visibly improved fluid status PULM  CTAB CV  RRR VASC  L brachial palpable, no edema  Laboratory CBC    Component Value Date/Time   WBC 10.0 09/05/2014 0311   HGB 8.8* 09/05/2014 0311   HCT 27.7* 09/05/2014 0311   PLT 249 09/05/2014 0311    BMET    Component Value Date/Time   NA 138 09/05/2014 0311   K 4.3 09/05/2014 0311   CL 101 09/05/2014 0311   CO2 25 09/05/2014 0311   GLUCOSE 104* 09/05/2014 0311   BUN 32* 09/05/2014 0311   CREATININE 4.81* 09/05/2014 0311   CALCIUM 7.6* 09/05/2014 0311   GFRNONAA 12* 09/05/2014 0311   GFRAA 14* 09/05/2014 0311    Adele Barthel, MD [facility]: [phone] Pager: [phone]  09/05/2014, 9:04 AM

## 2014-09-05 NOTE — Progress Notes (Signed)
[facility] PROGRESS NOTE  Kyle [name] [id number] DOB: January 06, 1956 DOA: 08/20/2014 PCP: No primary care provider on file.     PULMONARY / CRITICAL CARE transfer to [facility] service /3W on 8/6 Assessment/Plan: Principal Problem:   Anasarca Active Problems:   Acute on chronic combined systolic and diastolic CHF, NYHA class 2   CKD (chronic kidney disease) stage 5, GFR less than 15 ml/min   Benign essential HTN   DM (diabetes mellitus), type 2, uncontrolled, with renal complications   Troponin level elevated   Anemia of chronic disease   Cardiorenal syndrome with renal failure   Ascites   Acute respiratory failure, unspecified whether with hypoxia or hypercapnia   Cardiac arrest   Pressure ulcer   Acute respiratory failure with hypoxia   NSVT (nonsustained ventricular tachycardia)   Debility    PEA cardiac arrest - intra-operatively post catheter ( Tunneled cath for dialysis)  .  - Patient has cardiomyopathy, EF of 10% - Cardiology following, continue Coreg twice a day and diuresis with hemodialysis - Cardiac catheterization 8/10 showed single-vessel obstructive coronary disease involving a nondominant right coronary artery. -  EP recommended cath and "exclusion of amyloid", possible fat pad biopsy by IR in a.m.    Possible Aspiration Pneumonia  - completed 7 day course of tx  Acute respiratory failure after cardiac arrest.pleural effusion rt?, pulm edema, gross overlaod - Resolved, currently on room air  End-stage renal disease - Initially on on CRRT from 7/27-8/3, now on hemodialysis. -  Will need OP HD  and perm access  prior to discharge , awaiting outpt spot at [facility]. -  VVS following, okay by cardiology to proceed with permanent fistula placement anytime  Nausea/diarrhea - resolved  Diabetes mellitus - Labile, continue with insulin sliding scale  Acute encephalopathy after cardiac arrest -  - Resolved, completely back to baseline,patient did  suffer an unwitnessed fall on 8/4. CT scan of the head/neck was negative for any fracture or obvious trauma. Incidentally found thyroid nodule on CT scan of the neck will need to be followed up as an outpatient.  Anemia of chronic disease -  On Aranesp.  Cardiomyopathy - acute on chronic systolic heart failure - EF 10%,-  continue Coreg , and hydralazine . Hopefully improve with hemodialysis,  not on ACE due to ESRD   Code Status:      Code Status Orders        Start     Ordered   08/21/14 1649  Full code   Continuous     08/21/14 1652     Family Communication: family updated about patient's clinical progress Disposition Plan:  Physical therapy recommends home health, 24-hour supervision, anticipate discharge Thursday or Friday    Brief narrative: 59 yo male with PMHx of CKD Stage V (non-compliant with f/u), chronic combined CHF (EF 15%), HTN and T2DM initially admitted 7/26 to [facility] with acute on chronic CHF and acute on CKD with SCr 10.7. Pt was tx to [facility] for placement of tunneled HD cath for initiation of dialysis. He went to OR 7/27 for placement of Tunneled cath (concious sedation only) and intra-operatively (post catheter placement) had bradycardia and ultimately PEA arrest with approx 10 mins CPR before ROSC. No purposeful mental status post arrest. Tx to ICU and PCCM consulted.       Consultants:  Critical care  Nephrology  Cardiology  Vascular surgery    Procedures: STUDIES:  7/27 paracentesis 7/28 Echo >>  EF 10% 8/04 CT Head/Neck - negative for fracture or trauma  SIGNIFICANT EVENTS: 7/27 Cardiac arrest, intubated and to ICU, hypothermia 7/29 rewarmed 7/30 more agitated 8/03 extubated 8/04 unwitnessed fall  Antibiotics: Anti-infectives    Start     Dose/Rate Route Frequency Ordered Stop   08/29/14 1000  piperacillin-tazobactam (ZOSYN) IVPB 2.25 g     2.25 g 100 mL/hr over 30 Minutes Intravenous 3 times per day 08/29/14 0855 08/31/14  0017   08/28/14 1200  piperacillin-tazobactam (ZOSYN) IVPB 3.375 g  Status:  Discontinued     3.375 g 100 mL/hr over 30 Minutes Intravenous 4 times per day 08/28/14 0905 08/28/14 0906   08/28/14 1100  piperacillin-tazobactam (ZOSYN) IVPB 3.375 g  Status:  Discontinued     3.375 g 100 mL/hr over 30 Minutes Intravenous Every 6 hours 08/28/14 0906 08/29/14 0855   08/24/14 1900  vancomycin (VANCOCIN) 1,500 mg in sodium chloride 0.9 % 500 mL IVPB  Status:  Discontinued     1,500 mg 250 mL/hr over 120 Minutes Intravenous Every 48 hours 08/22/14 1830 08/23/14 1332   08/24/14 1800  piperacillin-tazobactam (ZOSYN) IVPB 3.375 g  Status:  Discontinued     3.375 g 12.5 mL/hr over 240 Minutes Intravenous 4 times per day 08/24/14 1715 08/24/14 1719   08/24/14 1800  piperacillin-tazobactam (ZOSYN) IVPB 3.375 g  Status:  Discontinued     3.375 g 100 mL/hr over 30 Minutes Intravenous 4 times per day 08/24/14 1720 08/28/14 0905   08/24/14 1600  nafcillin 1 g in dextrose 5 % 50 mL IVPB  Status:  Discontinued     1 g 100 mL/hr over 30 Minutes Intravenous 6 times per day 08/24/14 1439 08/24/14 1715   08/24/14 1530  nafcillin injection 1 g  Status:  Discontinued     1 g Intravenous Every 4 hours 08/24/14 1428 08/24/14 1439   08/23/14 2100  vancomycin (VANCOCIN) IVPB 1000 mg/200 mL premix  Status:  Discontinued     1,000 mg 200 mL/hr over 60 Minutes Intravenous Every 24 hours 08/23/14 1340 08/24/14 1428   08/23/14 2000  vancomycin (VANCOCIN) IVPB 1000 mg/200 mL premix  Status:  Discontinued     1,000 mg 200 mL/hr over 60 Minutes Intravenous Every 24 hours 08/23/14 1333 08/23/14 1340   08/22/14 1900  vancomycin (VANCOCIN) 1,750 mg in sodium chloride 0.9 % 500 mL IVPB     1,750 mg 250 mL/hr over 120 Minutes Intravenous  Once 08/22/14 1826 08/22/14 2142   08/21/14 1353  ceFAZolin (ANCEF) IVPB 1 g/50 mL premix    Comments:  Send with pt to OR   1 g 100 mL/hr over 30 Minutes Intravenous To [facility]  08/21/14 0837 08/21/14 1500         HPI/Subjective: No complaints, denies shortness of breath, chest pain.   Objective: Filed Vitals:   09/04/14 1424 09/04/14 2143 09/05/14 0603 09/05/14 1508  BP: 140/81 126/67 152/75 161/97  Pulse: 69 72 79 84  Temp:  98.6 F (37 C) 98.4 F (36.9 C) 98.1 F (36.7 C)  TempSrc:  Oral Oral Oral  Resp: 22 18 18    Height:      Weight:      SpO2: 98% 100% 100% 99%   No intake or output data in the 24 hours ending 09/05/14 1628  Exam:  PE: General:Pleasant affect, NAD Skin:Warm and dry, brisk capillary refill HEENT:normocephalic, sclera clear, mucus membranes moist Heart:S1S2 RRR without murmur, gallup, rub or click Lungs:clear without rales, rhonchi, or  wheezes [id number], non tender, + BS, do not palpate liver spleen or masses Ext:no lower ext edema, pulses felt Neuro:alert and oriented X 3, MAE, follows commands, + facial symmetry  Tele: SR with PVCs  Data Review   Micro Results No results found for this or any previous visit (from the past 240 hour(s)).  Radiology Reports Dg Chest 2 View  08/20/2014   CLINICAL DATA:  Lower extremity edema, CHF and anasarca. History of renal failure.  EXAM: CHEST  2 VIEW  COMPARISON:  01/14/2014 and 06/20/2008 chest radiographs  FINDINGS: Small-moderate bilateral pleural effusions and bibasilar atelectasis again noted.  Mild pulmonary vascular congestion is present.  The visualized cardiomediastinal silhouette is unchanged.  There is no evidence of pneumothorax.  No acute bony abnormalities are identified.  IMPRESSION: Small-moderate bilateral pleural effusions with bibasilar atelectasis.  Mild pulmonary vascular congestion.   Electronically Signed   By: Margarette Canada M.D.   On: 08/20/2014 15:01   Ct Head Wo Contrast  08/29/2014   CLINICAL DATA:  Trauma. Unwitnessed fall. Confusion and agitation today. Initial encounter.  EXAM: CT HEAD WITHOUT CONTRAST  CT CERVICAL SPINE WITHOUT CONTRAST  TECHNIQUE:  Multidetector CT imaging of the head and cervical spine was performed following the standard protocol without intravenous contrast. Multiplanar CT image reconstructions of the cervical spine were also generated.  COMPARISON:  None.  FINDINGS: CT HEAD FINDINGS  Study is mildly motion degraded. Punctate hyperdense focus near the foramen of Magendie is consistent with calcification rather than hemorrhage. There is no evidence of acute intracranial hemorrhage. Ventricles and sulci are normal in size for age. There is no evidence of acute large territory infarct, mass, midline shift, or extra-axial fluid collection. Periventricular white-matter hypodensities are nonspecific but compatible with mild chronic small vessel ischemic disease.  Orbits are unremarkable. The visualized mastoid air cells are clear. There is mild right frontal and bilateral ethmoid sinus mucosal thickening. No skull fracture is identified.  CT CERVICAL SPINE FINDINGS  There is mild motion artifact through the mid to lower cervical spine despite repeating the study. There is straightening of the normal cervical lordosis. There is no listhesis. Moderate to severe disc space narrowing is present from C4-5 to C6-7 with associated degenerative endplate changes. No cervical spine fracture is identified. Moderate spinal stenosis is suspected at C4-5 and C5-6 due to broad-based posterior disc osteophyte complexes, and there is moderate to severe neural foraminal stenosis on the left at C4-5 and on the right at C5-6.  Anasarca is noted. A right jugular central venous catheter is partially visualized. A 2.5 cm hypoattenuating nodule is noted in the right thyroid lobe. Bilateral pleural effusions are partially visualized. Skin staples are noted in the right lower neck.  IMPRESSION: 1. Mildly motion degraded examination. 2. No evidence of acute intracranial abnormality. 3. No acute osseous abnormality identified in the cervical spine. Multilevel cervical disc  degeneration. 4. Anasarca and bilateral pleural effusions, incompletely visualized. 5. 2.5 cm right thyroid nodule. Further evaluation with outpatient thyroid ultrasound is suggested.   Electronically Signed   By: Logan Bores   On: 08/29/2014 15:32   Ct Cervical Spine Wo Contrast  08/29/2014   CLINICAL DATA:  Trauma. Unwitnessed fall. Confusion and agitation today. Initial encounter.  EXAM: CT HEAD WITHOUT CONTRAST  CT CERVICAL SPINE WITHOUT CONTRAST  TECHNIQUE: Multidetector CT imaging of the head and cervical spine was performed following the standard protocol without intravenous contrast. Multiplanar CT image reconstructions of the cervical spine were also generated.  COMPARISON:  None.  FINDINGS: CT HEAD FINDINGS  Study is mildly motion degraded. Punctate hyperdense focus near the foramen of Magendie is consistent with calcification rather than hemorrhage. There is no evidence of acute intracranial hemorrhage. Ventricles and sulci are normal in size for age. There is no evidence of acute large territory infarct, mass, midline shift, or extra-axial fluid collection. Periventricular white-matter hypodensities are nonspecific but compatible with mild chronic small vessel ischemic disease.  Orbits are unremarkable. The visualized mastoid air cells are clear. There is mild right frontal and bilateral ethmoid sinus mucosal thickening. No skull fracture is identified.  CT CERVICAL SPINE FINDINGS  There is mild motion artifact through the mid to lower cervical spine despite repeating the study. There is straightening of the normal cervical lordosis. There is no listhesis. Moderate to severe disc space narrowing is present from C4-5 to C6-7 with associated degenerative endplate changes. No cervical spine fracture is identified. Moderate spinal stenosis is suspected at C4-5 and C5-6 due to broad-based posterior disc osteophyte complexes, and there is moderate to severe neural foraminal stenosis on the left at C4-5 and  on the right at C5-6.  Anasarca is noted. A right jugular central venous catheter is partially visualized. A 2.5 cm hypoattenuating nodule is noted in the right thyroid lobe. Bilateral pleural effusions are partially visualized. Skin staples are noted in the right lower neck.  IMPRESSION: 1. Mildly motion degraded examination. 2. No evidence of acute intracranial abnormality. 3. No acute osseous abnormality identified in the cervical spine. Multilevel cervical disc degeneration. 4. Anasarca and bilateral pleural effusions, incompletely visualized. 5. 2.5 cm right thyroid nodule. Further evaluation with outpatient thyroid ultrasound is suggested.   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  PROCEDURE: An ultrasound guided paracentesis was thoroughly discussed with the patient and questions answered. The benefits, risks, alternatives and complications were also discussed. The patient understands and wishes to proceed with the procedure. Written consent was obtained.  Ultrasound was performed to localize and mark an adequate pocket of fluid in the left lower quadrant of the abdomen. The area was then prepped and draped in the normal sterile fashion. 1% Lidocaine was used for local anesthesia. Under ultrasound guidance a 19 gauge Yueh catheter was introduced. Paracentesis was performed. The catheter was removed and a dressing applied.  COMPLICATIONS: None.  FINDINGS: A total of approximately 2.5 liters of clear yellow fluid was removed. A fluid sample was sent for laboratory analysis.  IMPRESSION: Successful ultrasound guided paracentesis yielding 2.5 liters of ascites.  Read by:  Gareth Eagle, PA-C   Electronically Signed   By: Sandi Mariscal M.D.   On: 08/21/2014 14:24   Dg Chest  Port 1 View  08/26/2014   CLINICAL DATA:  CHF, acute respiratory failure, anasarca  EXAM: PORTABLE CHEST - 1 VIEW  COMPARISON:  Portable chest x-ray of August 25, 2014  FINDINGS: The lungs remain hypoinflated. There are persistent bilateral pleural effusions as well bibasilar atelectasis or infiltrate. There is no pneumothorax. The cardiac silhouette is largely obscured. The central pulmonary vascularity is more distinct today. The endotracheal tube tip lies 2 cm above the carina. The esophagogastric tube tip projects below the inferior margin of the image. The left internal jugular venous catheter tip projects over the  midportion of the SVC. The large caliber dual-lumen right internal jugular catheter projects over lower third of the SVC.  IMPRESSION: Slight interval improvement in the appearance of the will pulmonary interstitium and central pulmonary vascularity may reflect improving pulmonary edema. Bilateral pleural effusions and bibasilar atelectasis or infiltrate persists. The support tubes are in reasonable position.   Electronically Signed   By: David  Martinique M.D.   On: 08/26/2014 07:12   Dg Chest Port 1 View  08/25/2014   CLINICAL DATA:  Cardiac arrest  EXAM: PORTABLE CHEST - 1 VIEW  COMPARISON:  Radiograph 08/24/2014  FINDINGS: Endotracheal tube is approximately 1 cm from the carina. LEFT central venous line and NG tube are unchanged. There are low lung volumes and basilar atelectasis. Small effusions noted. Central venous congestion present. No pneumothorax.  IMPRESSION: 1. Endotracheal tube is relatively low approximately 1 cm from carina. No significant change. 2. Low lung volumes, bibasilar atelectasis and small effusions.   Electronically Signed   By: Suzy Bouchard M.D.   On: 08/25/2014 09:09   Dg Chest Port 1 View  08/24/2014   CLINICAL DATA:  Acute respiratory failure, hypoxia. Exchange of endotracheal tube.  EXAM: PORTABLE CHEST - 1 VIEW  COMPARISON:  08/24/2014  FINDINGS: Endotracheal tube  is 2.5 cm above the carina. Left central line and right dialysis catheter is well is NG tube are unchanged.  Bilateral perihilar and lower lobe opacities with layering effusions, unchanged.  IMPRESSION: Endotracheal tube 2.5 cm above the carina. Otherwise no significant change.   Electronically Signed   By: Rolm Baptise M.D.   On: 08/24/2014 18:29   Dg Chest Port 1 View  08/24/2014   CLINICAL DATA:  Respiratory failure  EXAM: PORTABLE CHEST - 1 VIEW  COMPARISON:  08/23/2014  FINDINGS: Layering moderate right pleural effusion. Small left pleural effusion.  Associated bilateral lower lobe opacities, likely atelectasis.  Pulmonary vascular congestion without frank interstitial edema.  Endotracheal tube terminates 5.5 cm above the carina.  The heart is normal in size.  Right IJ dual lumen dialysis catheter with its tip in the right atrium. Left IJ venous catheter with its tip in the mid SVC.  Enteric tube courses below the diaphragm.  IMPRESSION: Pulmonary vascular congestion without frank interstitial edema.  Layering moderate right pleural effusion. Small left pleural effusion.  Associated bilateral lower lobe opacities, likely atelectasis.  Endotracheal tube terminates 5.5 cm above the carina. Additional support apparatus as above.   Electronically Signed   By: Julian Hy M.D.   On: 08/24/2014 08:17   Dg Chest Port 1 View  08/23/2014   CLINICAL DATA:  Respiratory difficulty  EXAM: PORTABLE CHEST - 1 VIEW  COMPARISON:  08/21/2014  FINDINGS: Tubular devices are stable. Normal heart size. Bilateral pleural effusions are stable. Central airspace disease is stable. No pneumothorax.  IMPRESSION: Stable bilateral pleural effusions and bilateral central airspace disease.   Electronically Signed   By: Marybelle Killings M.D.   On: 08/23/2014 07:44   Dg Chest Port 1 View  08/21/2014   CLINICAL DATA:  Hypoxia  EXAM: PORTABLE CHEST - 1 VIEW  COMPARISON:  August 20, 2014  FINDINGS: Endotracheal tube tip is 2.9 cm above  the carina. Left jugular catheter tip is in the superior vena cava just beyond the junction with the left innominate vein. Dual-lumen catheter tip is in the right atrium. Nasogastric tube tip and side port are below the diaphragm. No pneumothorax. There is cardiomegaly with bilateral effusions and alveolar edema bilaterally.  No adenopathy.  IMPRESSION: Congestive heart failure. Tube and catheter positions as described without pneumothorax.   Electronically Signed   By: Lowella Grip III M.D.   On: 08/21/2014 18:41   Dg Abd Portable 1v  08/25/2014   CLINICAL DATA:  Ileus.  EXAM: PORTABLE ABDOMEN - 1 VIEW  COMPARISON:  08/21/2014  FINDINGS: NG tube remains in the stomach. There is a nonobstructive bowel gas pattern. No gaseous distention to suggest ileus. No free air or visible organomegaly or suspicious calcification. No acute bony abnormality.  IMPRESSION: No evidence of obstruction or ileus.   Electronically Signed   By: Rolm Baptise M.D.   On: 08/25/2014 12:43   Dg Abd Portable 1v  08/21/2014   CLINICAL DATA:  OG tube placement  EXAM: PORTABLE ABDOMEN - 1 VIEW  COMPARISON:  None.  FINDINGS: NG tube with tip in the gastric body. Paucity of gas in the abdomen. Bilateral pleural effusions.  IMPRESSION: NG tube with tip in the stomach.   Electronically Signed   By: Suzy Bouchard M.D.   On: 08/21/2014 18:42   Dg Fluoro Guide Cv Line-no Report  08/21/2014   CLINICAL DATA:    FLOURO GUIDE CV LINE  Fluoroscopy was utilized by the requesting physician.  No radiographic  interpretation.      CBC  Recent Labs Lab 09/01/14 0415 09/02/14 0426 09/03/14 0505 09/04/14 0425 09/04/14 1258 09/05/14 0311  WBC 9.6 9.4 9.2 10.4 8.0 10.0  HGB 9.5* 9.5* 9.5* 9.4* 9.2* 8.8*  HCT 30.1* 29.3* 28.9* 29.3* 28.5* 27.7*  PLT 287 288 291 265 245 249  MCV 84.1 84.9 83.3 85.4 84.3 86.0  MCH 26.5 27.5 27.4 27.4 27.2 27.3  MCHC 31.6 32.4 32.9 32.1 32.3 31.8  RDW 19.9* 20.5* 20.4* 20.9* 20.6* 21.2*  LYMPHSABS 0.8  1.1 1.4 1.0  --  1.3  MONOABS 1.0 1.0 0.6 0.7  --  0.7  EOSABS 0.0 0.1 0.2 0.1  --  0.1  BASOSABS 0.0 0.0 0.0 0.0  --  0.0    Chemistries   Recent Labs Lab 09/01/14 0415 09/02/14 0426 09/03/14 0505 09/04/14 0425 09/04/14 1258 09/05/14 0311  NA 139 139 137 138  --  138  K 3.7 3.9 3.8 4.1  --  4.3  CL 102 104 101 102  --  101  CO2 26 24 24 28   --  25  GLUCOSE 99 73 72 107*  --  104*  BUN 26* 39* 45* 24*  --  32*  CREATININE 3.20* 4.19* 5.03* 3.64* 4.17* 4.81*  CALCIUM 8.4* 8.2* 7.9* 7.6*  --  7.6*  MG 2.2 2.1 2.0 2.0  --  1.9   ------------------------------------------------------------------------------------------------------------------ estimated creatinine clearance is 18.1 mL/min (by C-G formula based on Cr of 4.81). ------------------------------------------------------------------------------------------------------------------ No results for input(s): HGBA1C in the last 72 hours. ------------------------------------------------------------------------------------------------------------------ No results for input(s): CHOL, HDL, LDLCALC, TRIG, CHOLHDL, LDLDIRECT in the last 72 hours. ------------------------------------------------------------------------------------------------------------------ No results for input(s): TSH, T4TOTAL, T3FREE, THYROIDAB in the last 72 hours.  Invalid input(s): FREET3 ------------------------------------------------------------------------------------------------------------------ No results for input(s): VITAMINB12, FOLATE, FERRITIN, TIBC, IRON, RETICCTPCT in the last 72 hours.  Coagulation profile  Recent Labs Lab 09/04/14 0425  INR 1.14    No results for input(s): DDIMER in the last 72 hours.  Cardiac Enzymes No results for input(s): CKMB, TROPONINI, MYOGLOBIN in the last 168 hours.  Invalid input(s):  CK ------------------------------------------------------------------------------------------------------------------ Invalid input(s): POCBNP   CBG:  Recent Labs Lab 09/04/14 1210 09/04/14 1648 09/04/14 2131 09/05/14 0742 09/05/14 1147  GLUCAP 98 288* 135* 190* 144*       Studies: No results found.    Lab Results  Component Value Date   HGBA1C 7.6* 08/20/2014   HGBA1C 6.5* 01/13/2014   Lab Results  Component Value Date   CREATININE 4.81* 09/05/2014       Scheduled Meds: . antiseptic oral rinse  7 mL Mouth Rinse BID  . carvedilol  12.5 mg Oral BID WC  . darbepoetin (ARANESP) injection - DIALYSIS  150 mcg Intravenous Q Tue-HD  . doxercalciferol  2 mcg Intravenous Q T,Th,Sa-HD  . feeding supplement (NEPRO CARB STEADY)  237 mL Oral BID BM  . heparin  5,000 Units Subcutaneous 3 times per day  . [START ON 09/07/2014] heparin subcutaneous  5,000 Units Subcutaneous 3 times per day  . hydrALAZINE  25 mg Oral QID  . insulin aspart  0-20 Units Subcutaneous TID AC & HS  . multivitamin  1 tablet Oral QHS  . sodium chloride  10-40 mL Intracatheter Q12H  . sodium chloride  3 mL Intravenous Q12H   Continuous Infusions: . sodium chloride Stopped (08/24/14 0300)  . sodium chloride      Principal Problem:   Anasarca Active Problems:   Acute on chronic combined systolic and diastolic CHF, NYHA class 2   CKD (chronic kidney disease) stage 5, GFR less than 15 ml/min   Benign essential HTN   DM (diabetes mellitus), type 2, uncontrolled, with renal complications   Troponin level elevated   Anemia of chronic disease   Cardiorenal syndrome with renal failure   Ascites   Acute respiratory failure, unspecified whether with hypoxia or hypercapnia   Cardiac arrest   Pressure ulcer   Acute respiratory failure with hypoxia   NSVT (nonsustained ventricular tachycardia)   Debility    Time spent: 35 minutes   Aadi Bordner MD [facility] Pager [phone]. If  7PM-7AM, please contact night-coverage at [email or web address], password [facility] 09/05/2014, 4:28 PM  LOS: 16 days

## 2014-09-05 NOTE — Progress Notes (Signed)
Discussed with Dr. Mare Ferrari. Ok to proceed with permanent fistula placement any time.  Reeve Mallo, PA-C

## 2014-09-05 NOTE — Progress Notes (Signed)
S: No new CO  Results of ht cath noted O:BP 152/75 mmHg  Pulse 79  Temp(Src) 98.4 F (36.9 C) (Oral)  Resp 18  Ht 6' (1.829 m)  Wt 79 kg (174 lb 2.6 oz)  BMI 23.62 kg/m2  SpO2 100%  Intake/Output Summary (Last 24 hours) at 09/05/14 [id number] Last data filed at 09/04/14 1300  Gross per 24 hour  Intake    120 ml  Output      0 ml  Net    120 ml   Weight change:  [id number] and alert CVS:RRR Resp: clear Abd:+ BS NTND Ext: No edema NEURO: CNI Ox3, no asterixis Rt IJ Permcath   . antiseptic oral rinse  7 mL Mouth Rinse BID  . carvedilol  12.5 mg Oral BID WC  . darbepoetin (ARANESP) injection - DIALYSIS  150 mcg Intravenous Q Tue-HD  . doxercalciferol  2 mcg Intravenous Q T,Th,Sa-HD  . heparin  5,000 Units Subcutaneous 3 times per day  . hydrALAZINE  25 mg Oral QID  . insulin aspart  0-20 Units Subcutaneous TID AC & HS  . multivitamin  1 tablet Oral QHS  . sodium chloride  10-40 mL Intracatheter Q12H  . sodium chloride  3 mL Intravenous Q12H   No results found. BMET    Component Value Date/Time   NA 138 09/05/2014 0311   K 4.3 09/05/2014 0311   CL 101 09/05/2014 0311   CO2 25 09/05/2014 0311   GLUCOSE 104* 09/05/2014 0311   BUN 32* 09/05/2014 0311   CREATININE 4.81* 09/05/2014 0311   CALCIUM 7.6* 09/05/2014 0311   GFRNONAA 12* 09/05/2014 0311   GFRAA 14* 09/05/2014 0311   CBC    Component Value Date/Time   WBC 10.0 09/05/2014 0311   RBC 3.22* 09/05/2014 0311   RBC 3.59* 01/15/2014 0415   HGB 8.8* 09/05/2014 0311   HCT 27.7* 09/05/2014 0311   PLT 249 09/05/2014 0311   MCV 86.0 09/05/2014 0311   MCH 27.3 09/05/2014 0311   MCHC 31.8 09/05/2014 0311   RDW 21.2* 09/05/2014 0311   LYMPHSABS 1.3 09/05/2014 0311   MONOABS 0.7 09/05/2014 0311   EOSABS 0.1 09/05/2014 0311   BASOSABS 0.0 09/05/2014 0311     Assessment:  1. New ESRD, outpt spot at [facility]  TTS 2. Anemia on aranesp 3. 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 Cardiomyopathy  EF 10%   Plan: 1. HD today   Kyle [name] T

## 2014-09-05 NOTE — Progress Notes (Signed)
IR received request for fat pad biopsy. Patient is unsure if he would like the procedure and wishes to continue to eat today rather than be NPO for sedation. The procedure was offered with local anesthetic only so he could eat and he declines at this time. Will make NPO after midnight and check in with the patient tomorrow in case he has changed his mind.   Tsosie Billing PA-C Interventional Radiology  09/05/14  11:30 AM

## 2014-09-05 NOTE — Progress Notes (Signed)
Nutrition Follow-up  DOCUMENTATION CODES:   Not applicable  INTERVENTION:    Nepro Shake po BID, each supplement provides 425 kcal and 19 grams protein  NUTRITION DIAGNOSIS:   Increased nutrient needs related to chronic illness, wound healing as evidenced by estimated needs.  Ongoing  GOAL:   Patient will meet greater than or equal to 90% of their needs  Progressing  MONITOR:   PO intake, Supplement acceptance, Labs, Weight trends   ASSESSMENT:   Kyle [name] is a 59 yo male with PMHx of CKD Stage V and chronic systolic and diastolic CHF (EF 0000000) who was admitted for acute on chronic kidney and heart failure. Patient was anasarcous on admission and went for paracentesis yesterday. He later went for placement of a tunneled HD catheter and developed bradycardia after placement and went into PEA arrest of unclear etiology. Patient was intubated, resuscitated and place on hypothermia protocol.   PO intake has improved, patient is consuming 50-100% of meals. Patient is new to HD. He has been seen by a representative from the dialysis center where he will be receiving HD. He had a few questions about his diet, but didn't want to discuss a lot about it at this time. Answered his questions. He wants to review the packet of information provided by the dialysis center and talk to them when he is discharged. He does not want any more information at this time. Patient with increased nutrition needs for HD, would benefit from a PO supplement to ensure adequate calorie and protein intake for healing and to prevent weight loss.   Diet Order:  Diet renal with fluid restriction Fluid restriction:: 1200 mL Fluid; Room service appropriate?: Yes; Fluid consistency:: Thin  Skin:  Wound (see comment) (stage II to sacrum, incision to right neck)  Last BM:  8/10  Height:   Ht Readings from Last 1 Encounters:  08/21/14 6' (1.829 m)    Weight:   Wt Readings from Last 1 Encounters:  09/04/14 174  lb 2.6 oz (79 kg)    Ideal Body Weight:  80.9 kg  BMI:  Body mass index is 23.62 kg/(m^2).  Estimated Nutritional Needs:   Kcal:  [id number]  Protein:  105-120 gm  Fluid:  1.2 L  EDUCATION NEEDS:   Education needs addressed   Molli Barrows, [place], [place], [place] Pager [phone] After Hours Pager [phone]

## 2014-09-05 NOTE — Progress Notes (Signed)
Patient Name: Kyle [name] Date of Encounter: 09/05/2014   SUBJECTIVE  Feels better, denies chest pain, sob or palpitation.   CURRENT MEDS . antiseptic oral rinse  7 mL Mouth Rinse BID  . carvedilol  12.5 mg Oral BID WC  . darbepoetin (ARANESP) injection - DIALYSIS  150 mcg Intravenous Q Tue-HD  . doxercalciferol  2 mcg Intravenous Q T,Th,Sa-HD  . heparin  5,000 Units Subcutaneous 3 times per day  . hydrALAZINE  25 mg Oral QID  . insulin aspart  0-20 Units Subcutaneous TID AC & HS  . multivitamin  1 tablet Oral QHS  . sodium chloride  10-40 mL Intracatheter Q12H  . sodium chloride  3 mL Intravenous Q12H    OBJECTIVE  Filed Vitals:   09/04/14 1354 09/04/14 1424 09/04/14 2143 09/05/14 0603  BP: 129/73 140/81 126/67 152/75  Pulse: 72 69 72 79  Temp:   98.6 F (37 C) 98.4 F (36.9 C)  TempSrc:   Oral Oral  Resp: 30 22 18 18   Height:      Weight:      SpO2: 99% 98% 100% 100%    Intake/Output Summary (Last 24 hours) at 09/05/14 0923 Last data filed at 09/04/14 1300  Gross per 24 hour  Intake    120 ml  Output      0 ml  Net    120 ml   Filed Weights   09/02/14 0400 09/03/14 0523 09/04/14 0800  Weight: 173 lb 15.1 oz (78.9 kg) 173 lb 1 oz (78.5 kg) 174 lb 2.6 oz (79 kg)    PHYSICAL EXAM  General: Pleasant, NAD. Neuro: Alert and oriented X 3. Moves all extremities spontaneously. Psych: Normal affect. HEENT:  Normal  Neck: Supple without bruits or JVD. Rt IJ Permcath. Lungs:  Resp regular and unlabored, CTA. Heart: RRR no s3, s4, or murmurs. Abdomen: Soft, non-tender, non-distended, BS + x 4.  Extremities: No clubbing, cyanosis or edema. DP/PT/Radials 2+ and equal bilaterally.  Accessory Clinical Findings  CBC  Recent Labs  09/04/14 0425 09/04/14 1258 09/05/14 0311  WBC 10.4 8.0 10.0  NEUTROABS 8.5*  --  7.9*  HGB 9.4* 9.2* 8.8*  HCT 29.3* 28.5* 27.7*  MCV 85.4 84.3 86.0  PLT 265 245 0000000   Basic Metabolic Panel  Recent Labs  09/04/14 0425  09/04/14 1258 09/05/14 0311  NA 138  --  138  K 4.1  --  4.3  CL 102  --  101  CO2 28  --  25  GLUCOSE 107*  --  104*  BUN 24*  --  32*  CREATININE 3.64* 4.17* 4.81*  CALCIUM 7.6*  --  7.6*  MG 2.0  --  1.9  PHOS 2.4*  --  2.7   Liver Function Tests  Recent Labs  09/04/14 0425 09/05/14 0311  ALBUMIN 2.0* 2.0*    TELE  SR with PVCs.   Radiology/Studies ECHO; 08/22/14 Study Conclusions  - Left ventricle: Myocardium with speckled appearance, consider cardiac amyloidosis (versus ESRD). Smoke noted in LV. The cavity size was normal. Wall thickness was increased in a pattern of severe LVH. The estimated ejection fraction was 10%. Diffuse hypokinesis. Doppler parameters are consistent with abnormal left ventricular relaxation (grade 1 diastolic dysfunction). - Aortic valve: There was no stenosis. There was mild regurgitation. - Mitral valve: There was no significant regurgitation. - Left atrium: The atrium was mildly dilated. - Right ventricle: The cavity size was mildly dilated. Systolic function was severely reduced. -  Right atrium: The atrium was mildly dilated. - Atrial septum: There appeared to be left to right flow across the interatrial septum, ?PFO but not fully visualized. - Pulmonary arteries: No complete TR doppler jet so unable to estimate PA systolic pressure. - Systemic veins: IVC measured 1.6 cm with < 50% respirophasic variation, suggesting RA pressure 8 mmHg. - Pericardium, extracardiac: Large left pleural effusion. A small circumferential pericardial effusion was identified.  Impressions:  - Normal LV size with severe LV hypertrophy. Speckled myocardium suggests possibility of cardiac amyloidosis (versus ESRD). EF 10% with diffuse hypokinesis, smoke noted in the LV. Mildly dilated RV with severe systolic dysfunction. Large left pleural effusion, small pericardial effusion  ASSESSMENT AND PLAN  1. Cardiomyopathy  type undetermined probable hypertensive but yet to be determined, EF 10%.   2. Acute on chronic systolic heart failure compensated - EF 10% on echo. Continue Coreg, not on ACE due to ESRD  3. End-stage renal disease on hemodialysis - HD today.  VVS planning to placement of AVG - Recommending cardiology clearance.   4. Poorly controlled hypertension- BP improved on hydralazine , still elevated at time - hopefully improves after HD  5. Cardiac arrest PEA intra-operatively post catheter ( Tunneled cath for dialysis)- He is post artic sun. - Cardiac catheterization showed single-vessel obstructive coronary disease involving a nondominant right coronary artery.  There was moderate pulmonary hypertension with normal left ventricular filling pressures.  Medical management was recommended. - Spoken with [facility] Radiologist regarding fat pad biopsy- he will touch base later today  6. Anemia of chronic disease- nephrology added aranesp   Signed, Bhagat,Bhavinkumar PA-C Pager [phone]  Patient denies any chest discomfort or shortness of breath.  He is awaiting hemodialysis today.  He has eaten lunch.  His fat pad biopsy will probably take place tomorrow in interventional Radiology. Lungs are clear.

## 2014-09-05 NOTE — Progress Notes (Signed)
Chaplain followed up with Pt. Chaplain supported Pt with a surgery decision. Chaplain prayed with Pt . Chaplain will continue to follow.   09/05/14 1400  Clinical Encounter Type  Visited With Patient  Visit Type Spiritual support  Referral From Family  Spiritual Encounters  Spiritual Needs Emotional;Prayer

## 2014-09-06 ENCOUNTER — Inpatient Hospital Stay (HOSPITAL_COMMUNITY): Payer: 59

## 2014-09-06 ENCOUNTER — Inpatient Hospital Stay (HOSPITAL_COMMUNITY): Admit: 2014-09-06 | Payer: 59

## 2014-09-06 ENCOUNTER — Encounter (HOSPITAL_COMMUNITY): Payer: Self-pay | Admitting: Radiology

## 2014-09-06 ENCOUNTER — Other Ambulatory Visit (HOSPITAL_COMMUNITY): Payer: Self-pay | Admitting: Interventional Radiology

## 2014-09-06 DIAGNOSIS — Z992 Dependence on renal dialysis: Secondary | ICD-10-CM

## 2014-09-06 DIAGNOSIS — N186 End stage renal disease: Secondary | ICD-10-CM | POA: Diagnosis present

## 2014-09-06 LAB — GLUCOSE, CAPILLARY
GLUCOSE-CAPILLARY: 137 mg/dL — AB (ref 65–99)
GLUCOSE-CAPILLARY: 257 mg/dL — AB (ref 65–99)
Glucose-Capillary: 221 mg/dL — ABNORMAL HIGH (ref 65–99)
Glucose-Capillary: 97 mg/dL (ref 65–99)
Glucose-Capillary: 233 mg/dL — ABNORMAL HIGH (ref 65–99)

## 2014-09-06 LAB — CBC WITH DIFFERENTIAL/PLATELET
BASOS ABS: 0 10*3/uL (ref 0.0–0.1)
BASOS PCT: 0 % (ref 0–1)
Eosinophils Absolute: 0.1 10*3/uL (ref 0.0–0.7)
Eosinophils Relative: 1 % (ref 0–5)
HEMATOCRIT: 26.4 % — AB (ref 39.0–52.0)
Hemoglobin: 8.6 g/dL — ABNORMAL LOW (ref 13.0–17.0)
LYMPHS PCT: 13 % (ref 12–46)
Lymphs Abs: 1.3 10*3/uL (ref 0.7–4.0)
MCH: 27.8 pg (ref 26.0–34.0)
MCHC: 32.6 g/dL (ref 30.0–36.0)
MCV: 85.4 fL (ref 78.0–100.0)
MONO ABS: 0.8 10*3/uL (ref 0.1–1.0)
Monocytes Relative: 8 % (ref 3–12)
NEUTROS ABS: 8 10*3/uL — AB (ref 1.7–7.7)
NEUTROS PCT: 78 % — AB (ref 43–77)
PLATELETS: 226 10*3/uL (ref 150–400)
RBC: 3.09 MIL/uL — ABNORMAL LOW (ref 4.22–5.81)
RDW: 20.5 % — ABNORMAL HIGH (ref 11.5–15.5)
WBC: 10.3 10*3/uL (ref 4.0–10.5)

## 2014-09-06 LAB — RENAL FUNCTION PANEL
Albumin: 2 g/dL — ABNORMAL LOW (ref 3.5–5.0)
Anion gap: 8 (ref 5–15)
BUN: 25 mg/dL — ABNORMAL HIGH (ref 6–20)
CO2: 27 mmol/L (ref 22–32)
Calcium: 7.6 mg/dL — ABNORMAL LOW (ref 8.9–10.3)
Chloride: 100 mmol/L — ABNORMAL LOW (ref 101–111)
Creatinine, Ser: 4.22 mg/dL — ABNORMAL HIGH (ref 0.61–1.24)
GFR calc Af Amer: 16 mL/min — ABNORMAL LOW (ref >60)
GFR, EST NON AFRICAN AMERICAN: 14 mL/min — AB (ref >60)
Glucose, Bld: 187 mg/dL — ABNORMAL HIGH (ref 65–99)
Phosphorus: 2.1 mg/dL — ABNORMAL LOW (ref 2.5–4.6)
Potassium: 3.6 mmol/L (ref 3.5–5.1)
Sodium: 135 mmol/L (ref 135–145)

## 2014-09-06 LAB — MAGNESIUM: MAGNESIUM: 1.7 mg/dL (ref 1.7–2.4)

## 2014-09-06 MED ORDER — LISINOPRIL 10 MG PO TABS
10.0000 mg | ORAL_TABLET | Freq: Every day | ORAL | Status: DC
  Filled 2014-09-06: qty 1

## 2014-09-06 MED ORDER — LIDOCAINE-EPINEPHRINE 1 %-1:100000 IJ SOLN
INTRAMUSCULAR | Status: AC
  Filled 2014-09-06: qty 1

## 2014-09-06 MED ORDER — FENTANYL CITRATE (PF) 100 MCG/2ML IJ SOLN
INTRAMUSCULAR | Status: AC | PRN
  Administered 2014-09-06: 50 ug via INTRAVENOUS

## 2014-09-06 MED ORDER — FENTANYL CITRATE (PF) 100 MCG/2ML IJ SOLN
INTRAMUSCULAR | Status: AC
  Filled 2014-09-06: qty 4

## 2014-09-06 MED ORDER — MIDAZOLAM HCL 2 MG/2ML IJ SOLN
INTRAMUSCULAR | Status: AC
  Filled 2014-09-06: qty 4

## 2014-09-06 MED ORDER — MIDAZOLAM HCL 2 MG/2ML IJ SOLN
INTRAMUSCULAR | Status: AC | PRN
  Administered 2014-09-06: 1 mg via INTRAVENOUS

## 2014-09-06 NOTE — H&P (Signed)
Chief Complaint: Patient was seen in consultation today for Cardiomyopathy Chief Complaint  Patient presents with  . Bloated  . Hypertension   at the request of Cardiology   Referring Physician(s): Cardiology   History of Present Illness: Kyle [name] is a 59 y.o. male with cardiomyopathy EF 10% unknown etiology s/p heart catheterization with single vessel obstructive disease,  acute on chronic systolic CHF, [id number], Echo 08/22/14 speckled myocardium with possibility of cardiac amyloidosis versus ESRD. IR received request for fat pad biopsy. He denies any chest pain, shortness of breath or palpitations. He denies any active signs of bleeding or excessive bruising. He denies any recent fever or chills. The patient denies any history of sleep apnea or chronic oxygen use. He has previously tolerated sedation without complications.    Past Medical History  Diagnosis Date  . Hypertension   . Diabetes mellitus without complication     Past Surgical History  Procedure Laterality Date  . External ear surgery    . Tonsillectomy    . Insertion of dialysis catheter N/A 08/21/2014    Procedure: INSERTION OF DIALYSIS CATHETER RIGHT INTERNAL JUGULAR VEIN;  Surgeon: Conrad Collbran, MD;  Location: [facility];  Service: Vascular;  Laterality: N/A;  MAC to General  . Cardiac catheterization N/A 09/04/2014    Procedure: Right/Left Heart Cath and Coronary Angiography;  Surgeon: Peter M Martinique, MD;  Location: [facility];  Service: Cardiovascular;  Laterality: N/A;    Allergies: Review of patient's allergies indicates no known allergies.  Medications: Prior to Admission medications   Medication Sig Start Date End Date Taking? Authorizing Provider  aspirin 81 MG chewable tablet Chew 1 tablet (81 mg total) by mouth daily. 01/17/14  Yes Barton Dubois, MD  calcitRIOL (ROCALTROL) 0.25 MCG capsule Take 1 capsule (0.25 mcg total) by mouth every Monday, Wednesday, and Friday. 01/17/14  Yes Barton Dubois, MD  carvedilol (COREG) 12.5 MG tablet Take 0.5 tablets (6.25 mg total) by mouth 2 (two) times daily with a meal. 01/17/14  Yes Barton Dubois, MD  ferrous sulfate 325 (65 FE) MG tablet Take 1 tablet (325 mg total) by mouth daily with breakfast. 01/18/14  Yes Barton Dubois, MD  furosemide (LASIX) 80 MG tablet Take 1 tablet (80 mg total) by mouth 3 (three) times daily. 01/17/14  Yes Barton Dubois, MD  glimepiride (AMARYL) 2 MG tablet Take 1 tablet (2 mg total) by mouth daily with breakfast. 01/17/14  Yes Barton Dubois, MD  hydrALAZINE (APRESOLINE) 25 MG tablet Take 1.5 tablets (37.5 mg total) by mouth 3 (three) times daily. Patient not taking: Reported on 08/20/2014 01/17/14 01/17/15  Etta Quill, DO  isosorbide dinitrate (ISORDIL) 20 MG tablet Take 1 tablet (20 mg total) by mouth 3 (three) times daily. Patient not taking: Reported on 08/20/2014 01/17/14   Etta Quill, DO  isosorbide-hydrALAZINE (BIDIL) 20-37.5 MG per tablet Take 1 tablet by mouth 3 (three) times daily. Patient not taking: Reported on 08/20/2014 01/17/14   Barton Dubois, MD     Family History  Problem Relation Age of Onset  . Heart failure Mother     Social History   Social History  . Marital Status: Married    Spouse Name: N/A  . Number of Children: N/A  . Years of Education: N/A   Social History Main Topics  . Smoking status: Former Smoker    Types: Cigarettes  . Smokeless tobacco: Never Used  . Alcohol Use: No  . Drug  Use: No  . Sexual Activity: Not Asked   Other Topics Concern  . None   Social History Narrative   Review of Systems: A 12 point ROS discussed and pertinent positives are indicated in the HPI above.  All other systems are negative.  Review of Systems  Vital Signs: BP 148/74 mmHg  Pulse 89  Temp(Src) 98.5 F (36.9 C) (Oral)  Resp 16  Ht 6' (1.829 m)  Wt 178 lb 9.2 oz (81 kg)  BMI 24.21 kg/m2  SpO2 97%  Physical Exam  Constitutional: He is oriented to person, place, and  time. No distress.  HENT:  Head: Normocephalic and atraumatic.  Neck: No tracheal deviation present.  Cardiovascular: Normal rate and regular rhythm.  Exam reveals no gallop and no friction rub.   No murmur heard. Pulmonary/Chest: Effort normal and breath sounds normal. No respiratory distress. He has no wheezes. He has no rales.  Abdominal: Soft. Bowel sounds are normal. He exhibits no distension. There is no tenderness.  Neurological: He is alert and oriented to person, place, and time.  Skin: He is not diaphoretic.    Mallampati Score:  MD Evaluation Airway: WNL Heart: WNL Abdomen: WNL Chest/ Lungs: WNL ASA  Classification: 3 Mallampati/Airway Score: Two  Imaging: Dg Chest 2 View  08/20/2014   CLINICAL DATA:  Lower extremity edema, CHF and anasarca. History of renal failure.  EXAM: CHEST  2 VIEW  COMPARISON:  01/14/2014 and 06/20/2008 chest radiographs  FINDINGS: Small-moderate bilateral pleural effusions and bibasilar atelectasis again noted.  Mild pulmonary vascular congestion is present.  The visualized cardiomediastinal silhouette is unchanged.  There is no evidence of pneumothorax.  No acute bony abnormalities are identified.  IMPRESSION: Small-moderate bilateral pleural effusions with bibasilar atelectasis.  Mild pulmonary vascular congestion.   Electronically Signed   By: Margarette Canada M.D.   On: 08/20/2014 15:01   Ct Head Wo Contrast  08/29/2014   CLINICAL DATA:  Trauma. Unwitnessed fall. Confusion and agitation today. Initial encounter.  EXAM: CT HEAD WITHOUT CONTRAST  CT CERVICAL SPINE WITHOUT CONTRAST  TECHNIQUE: Multidetector CT imaging of the head and cervical spine was performed following the standard protocol without intravenous contrast. Multiplanar CT image reconstructions of the cervical spine were also generated.  COMPARISON:  None.  FINDINGS: CT HEAD FINDINGS  Study is mildly motion degraded. Punctate hyperdense focus near the foramen of Magendie is consistent with  calcification rather than hemorrhage. There is no evidence of acute intracranial hemorrhage. Ventricles and sulci are normal in size for age. There is no evidence of acute large territory infarct, mass, midline shift, or extra-axial fluid collection. Periventricular white-matter hypodensities are nonspecific but compatible with mild chronic small vessel ischemic disease.  Orbits are unremarkable. The visualized mastoid air cells are clear. There is mild right frontal and bilateral ethmoid sinus mucosal thickening. No skull fracture is identified.  CT CERVICAL SPINE FINDINGS  There is mild motion artifact through the mid to lower cervical spine despite repeating the study. There is straightening of the normal cervical lordosis. There is no listhesis. Moderate to severe disc space narrowing is present from C4-5 to C6-7 with associated degenerative endplate changes. No cervical spine fracture is identified. Moderate spinal stenosis is suspected at C4-5 and C5-6 due to broad-based posterior disc osteophyte complexes, and there is moderate to severe neural foraminal stenosis on the left at C4-5 and on the right at C5-6.  Anasarca is noted. A right jugular central venous catheter is partially visualized. A 2.5 cm hypoattenuating  nodule is noted in the right thyroid lobe. Bilateral pleural effusions are partially visualized. Skin staples are noted in the right lower neck.  IMPRESSION: 1. Mildly motion degraded examination. 2. No evidence of acute intracranial abnormality. 3. No acute osseous abnormality identified in the cervical spine. Multilevel cervical disc degeneration. 4. Anasarca and bilateral pleural effusions, incompletely visualized. 5. 2.5 cm right thyroid nodule. Further evaluation with outpatient thyroid ultrasound is suggested.   Electronically Signed   By: Logan Bores   On: 08/29/2014 15:32   Ct Cervical Spine Wo Contrast  08/29/2014   CLINICAL DATA:  Trauma. Unwitnessed fall. Confusion and agitation  today. Initial encounter.  EXAM: CT HEAD WITHOUT CONTRAST  CT CERVICAL SPINE WITHOUT CONTRAST  TECHNIQUE: Multidetector CT imaging of the head and cervical spine was performed following the standard protocol without intravenous contrast. Multiplanar CT image reconstructions of the cervical spine were also generated.  COMPARISON:  None.  FINDINGS: CT HEAD FINDINGS  Study is mildly motion degraded. Punctate hyperdense focus near the foramen of Magendie is consistent with calcification rather than hemorrhage. There is no evidence of acute intracranial hemorrhage. Ventricles and sulci are normal in size for age. There is no evidence of acute large territory infarct, mass, midline shift, or extra-axial fluid collection. Periventricular white-matter hypodensities are nonspecific but compatible with mild chronic small vessel ischemic disease.  Orbits are unremarkable. The visualized mastoid air cells are clear. There is mild right frontal and bilateral ethmoid sinus mucosal thickening. No skull fracture is identified.  CT CERVICAL SPINE FINDINGS  There is mild motion artifact through the mid to lower cervical spine despite repeating the study. There is straightening of the normal cervical lordosis. There is no listhesis. Moderate to severe disc space narrowing is present from C4-5 to C6-7 with associated degenerative endplate changes. No cervical spine fracture is identified. Moderate spinal stenosis is suspected at C4-5 and C5-6 due to broad-based posterior disc osteophyte complexes, and there is moderate to severe neural foraminal stenosis on the left at C4-5 and on the right at C5-6.  Anasarca is noted. A right jugular central venous catheter is partially visualized. A 2.5 cm hypoattenuating nodule is noted in the right thyroid lobe. Bilateral pleural effusions are partially visualized. Skin staples are noted in the right lower neck.  IMPRESSION: 1. Mildly motion degraded examination. 2. No evidence of acute  intracranial abnormality. 3. No acute osseous abnormality identified in the cervical spine. Multilevel cervical disc degeneration. 4. Anasarca and bilateral pleural effusions, incompletely visualized. 5. 2.5 cm right thyroid nodule. Further evaluation with outpatient thyroid ultrasound is suggested.   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  PROCEDURE: An ultrasound guided paracentesis was thoroughly discussed with the patient and questions answered. The benefits, risks, alternatives and complications were also discussed. The patient understands and wishes to proceed with the procedure. Written consent was obtained.  Ultrasound was performed to localize and mark an adequate pocket of fluid in the left lower quadrant of the abdomen. The area was then prepped and draped in the normal sterile fashion. 1% Lidocaine was used for local anesthesia. Under ultrasound guidance a 19 gauge Yueh catheter was introduced. Paracentesis was performed. The catheter was removed and a dressing applied.  COMPLICATIONS: None.  FINDINGS: A  total of approximately 2.5 liters of clear yellow fluid was removed. A fluid sample was sent for laboratory analysis.  IMPRESSION: Successful ultrasound guided paracentesis yielding 2.5 liters of ascites.  Read by:  Gareth Eagle, PA-C   Electronically Signed   By: Sandi Mariscal M.D.   On: 08/21/2014 14:24   Dg Chest Port 1 View  08/26/2014   CLINICAL DATA:  CHF, acute respiratory failure, anasarca  EXAM: PORTABLE CHEST - 1 VIEW  COMPARISON:  Portable chest x-ray of August 25, 2014  FINDINGS: The lungs remain hypoinflated. There are persistent bilateral pleural effusions as well bibasilar atelectasis or infiltrate. There is no pneumothorax. The cardiac silhouette is  largely obscured. The central pulmonary vascularity is more distinct today. The endotracheal tube tip lies 2 cm above the carina. The esophagogastric tube tip projects below the inferior margin of the image. The left internal jugular venous catheter tip projects over the midportion of the SVC. The large caliber dual-lumen right internal jugular catheter projects over lower third of the SVC.  IMPRESSION: Slight interval improvement in the appearance of the will pulmonary interstitium and central pulmonary vascularity may reflect improving pulmonary edema. Bilateral pleural effusions and bibasilar atelectasis or infiltrate persists. The support tubes are in reasonable position.   Electronically Signed   By: David  Martinique M.D.   On: 08/26/2014 07:12   Dg Chest Port 1 View  08/25/2014   CLINICAL DATA:  Cardiac arrest  EXAM: PORTABLE CHEST - 1 VIEW  COMPARISON:  Radiograph 08/24/2014  FINDINGS: Endotracheal tube is approximately 1 cm from the carina. LEFT central venous line and NG tube are unchanged. There are low lung volumes and basilar atelectasis. Small effusions noted. Central venous congestion present. No pneumothorax.  IMPRESSION: 1. Endotracheal tube is relatively low approximately 1 cm from carina. No significant change. 2. Low lung volumes, bibasilar atelectasis and small effusions.   Electronically Signed   By: Suzy Bouchard M.D.   On: 08/25/2014 09:09   Dg Chest Port 1 View  08/24/2014   CLINICAL DATA:  Acute respiratory failure, hypoxia. Exchange of endotracheal tube.  EXAM: PORTABLE CHEST - 1 VIEW  COMPARISON:  08/24/2014  FINDINGS: Endotracheal tube is 2.5 cm above the carina. Left central line and right dialysis catheter is well is NG tube are unchanged.  Bilateral perihilar and lower lobe opacities with layering effusions, unchanged.  IMPRESSION: Endotracheal tube 2.5 cm above the carina. Otherwise no significant change.   Electronically Signed   By: Rolm Baptise M.D.   On: 08/24/2014 18:29    Dg Chest Port 1 View  08/24/2014   CLINICAL DATA:  Respiratory failure  EXAM: PORTABLE CHEST - 1 VIEW  COMPARISON:  08/23/2014  FINDINGS: Layering moderate right pleural effusion. Small left pleural effusion.  Associated bilateral lower lobe opacities, likely atelectasis.  Pulmonary vascular congestion without frank interstitial edema.  Endotracheal tube terminates 5.5 cm above the carina.  The heart is normal in size.  Right IJ dual lumen dialysis catheter with its tip in the right atrium. Left IJ venous catheter with its tip in the mid SVC.  Enteric tube courses below the diaphragm.  IMPRESSION: Pulmonary vascular congestion without frank interstitial edema.  Layering moderate right pleural effusion. Small left pleural effusion.  Associated bilateral lower lobe opacities, likely atelectasis.  Endotracheal tube terminates 5.5 cm above the carina. Additional support apparatus as above.   Electronically Signed   By: Julian Hy M.D.   On: 08/24/2014 08:17   Dg Chest Port 1  View  08/23/2014   CLINICAL DATA:  Respiratory difficulty  EXAM: PORTABLE CHEST - 1 VIEW  COMPARISON:  08/21/2014  FINDINGS: Tubular devices are stable. Normal heart size. Bilateral pleural effusions are stable. Central airspace disease is stable. No pneumothorax.  IMPRESSION: Stable bilateral pleural effusions and bilateral central airspace disease.   Electronically Signed   By: Marybelle Killings M.D.   On: 08/23/2014 07:44   Dg Chest Port 1 View  08/21/2014   CLINICAL DATA:  Hypoxia  EXAM: PORTABLE CHEST - 1 VIEW  COMPARISON:  August 20, 2014  FINDINGS: Endotracheal tube tip is 2.9 cm above the carina. Left jugular catheter tip is in the superior vena cava just beyond the junction with the left innominate vein. Dual-lumen catheter tip is in the right atrium. Nasogastric tube tip and side port are below the diaphragm. No pneumothorax. There is cardiomegaly with bilateral effusions and alveolar edema bilaterally. No adenopathy.   IMPRESSION: Congestive heart failure. Tube and catheter positions as described without pneumothorax.   Electronically Signed   By: Lowella Grip III M.D.   On: 08/21/2014 18:41   Dg Abd Portable 1v  08/25/2014   CLINICAL DATA:  Ileus.  EXAM: PORTABLE ABDOMEN - 1 VIEW  COMPARISON:  08/21/2014  FINDINGS: NG tube remains in the stomach. There is a nonobstructive bowel gas pattern. No gaseous distention to suggest ileus. No free air or visible organomegaly or suspicious calcification. No acute bony abnormality.  IMPRESSION: No evidence of obstruction or ileus.   Electronically Signed   By: Rolm Baptise M.D.   On: 08/25/2014 12:43   Dg Abd Portable 1v  08/21/2014   CLINICAL DATA:  OG tube placement  EXAM: PORTABLE ABDOMEN - 1 VIEW  COMPARISON:  None.  FINDINGS: NG tube with tip in the gastric body. Paucity of gas in the abdomen. Bilateral pleural effusions.  IMPRESSION: NG tube with tip in the stomach.   Electronically Signed   By: Suzy Bouchard M.D.   On: 08/21/2014 18:42   Dg Fluoro Guide Cv Line-no Report  08/21/2014   CLINICAL DATA:    FLOURO GUIDE CV LINE  Fluoroscopy was utilized by the requesting physician.  No radiographic  interpretation.     Labs:  CBC:  Recent Labs  09/04/14 0425 09/04/14 1258 09/05/14 0311 09/06/14 0518  WBC 10.4 8.0 10.0 10.3  HGB 9.4* 9.2* 8.8* 8.6*  HCT 29.3* 28.5* 27.7* 26.4*  PLT 265 245 249 226    COAGS:  Recent Labs  08/20/14 2058 08/21/14 1700 08/22/14 0115 09/04/14 0425  INR 1.28 1.30 1.29 1.14  APTT 36 34 35  --     BMP:  Recent Labs  09/03/14 0505 09/04/14 0425 09/04/14 1258 09/05/14 0311 09/06/14 0518  NA 137 138  --  138 135  K 3.8 4.1  --  4.3 3.6  CL 101 102  --  101 100*  CO2 24 28  --  25 27  GLUCOSE 72 107*  --  104* 187*  BUN 45* 24*  --  32* 25*  CALCIUM 7.9* 7.6*  --  7.6* 7.6*  CREATININE 5.03* 3.64* 4.17* 4.81* 4.22*  GFRNONAA 11* 17* 14* 12* 14*  GFRAA 13* 20* 17* 14* 16*    LIVER FUNCTION  TESTS:  Recent Labs  08/21/14 0802 08/21/14 1700  08/25/14 0430  08/26/14 0420  09/03/14 0505 09/04/14 0425 09/05/14 0311 09/06/14 0518  BILITOT 0.5 0.6  --  0.5  --  0.4  --   --   --   --   --  AST 24 31  --  17  --  20  --   --   --   --   --   ALT 38 40  --  15*  --  14*  --   --   --   --   --   ALKPHOS 130* 116  --  85  --  77  --   --   --   --   --   PROT 5.5* 5.1*  --  5.0*  --  5.1*  --   --   --   --   --   ALBUMIN 1.9* 1.8*  < > 1.7*  < > 1.7*  < > 2.0* 2.0* 2.0* 2.0*  < > = values in this interval not displayed.   Assessment and Plan: Cardiomyopathy EF 10% unknown etiology s/p heart catheterization with single vessel obstructive disease Acute on chronic systolic CHF, [id number], Echo 08/22/14 speckled myocardium with possibility of cardiac amyloidosis  S/p PEA cardiac arrest intra-operatively post tunneled catheter  ESRD on HD via right perm catheter, last HD yesterday Request for fat pad biopsy The patient has been NPO, no blood thinners taken, labs and vitals have been reviewed. Risks and Benefits discussed with the patient including, but not limited to bleeding, infection, damage to adjacent structures or low yield requiring additional tests. All of the patient's questions were answered, patient is agreeable to proceed. Consent signed and in chart. Anemia on chronic disease   Thank you for this interesting consult.  I greatly enjoyed meeting CURRY CONK and look forward to participating in their care.  A copy of this report was sent to the requesting provider on this date.  SignedHedy Jacob 09/06/2014, 9:50 AM   I spent a total of 20 Minutes in face to face in clinical consultation, greater than 50% of which was counseling/coordinating care for

## 2014-09-06 NOTE — Progress Notes (Signed)
S: No new CO  Results of ht cath noted O:BP 148/74 mmHg  Pulse 89  Temp(Src) 98.5 F (36.9 C) (Oral)  Resp 16  Ht 6' (1.829 m)  Wt 81 kg (178 lb 9.2 oz)  BMI 24.21 kg/m2  SpO2 97%  Intake/Output Summary (Last 24 hours) at 09/06/14 0709 Last data filed at 09/05/14 2023  Gross per 24 hour  Intake      0 ml  Output   3000 ml  Net  -3000 ml   Weight change: 7.3 kg (16 lb 1.5 oz) [id number] and alert CVS:RRR Resp: clear Abd:+ BS NTND Ext: No edema NEURO: CNI Ox3, no asterixis Rt IJ Permcath   . antiseptic oral rinse  7 mL Mouth Rinse BID  . carvedilol  12.5 mg Oral BID WC  . darbepoetin (ARANESP) injection - DIALYSIS  150 mcg Intravenous Q Tue-HD  . doxercalciferol  2 mcg Intravenous Q T,Th,Sa-HD  . feeding supplement (NEPRO CARB STEADY)  237 mL Oral BID BM  . [START ON 09/07/2014] heparin subcutaneous  5,000 Units Subcutaneous 3 times per day  . hydrALAZINE  25 mg Oral QID  . insulin aspart  0-20 Units Subcutaneous TID AC & HS  . multivitamin  1 tablet Oral QHS  . sodium chloride  10-40 mL Intracatheter Q12H  . sodium chloride  3 mL Intravenous Q12H   No results found. BMET    Component Value Date/Time   NA 135 09/06/2014 0518   K 3.6 09/06/2014 0518   CL 100* 09/06/2014 0518   CO2 27 09/06/2014 0518   GLUCOSE 187* 09/06/2014 0518   BUN 25* 09/06/2014 0518   CREATININE 4.22* 09/06/2014 0518   CALCIUM 7.6* 09/06/2014 0518   GFRNONAA 14* 09/06/2014 0518   GFRAA 16* 09/06/2014 0518   CBC    Component Value Date/Time   WBC 10.3 09/06/2014 0518   RBC 3.09* 09/06/2014 0518   RBC 3.59* 01/15/2014 0415   HGB 8.6* 09/06/2014 0518   HCT 26.4* 09/06/2014 0518   PLT 226 09/06/2014 0518   MCV 85.4 09/06/2014 0518   MCH 27.8 09/06/2014 0518   MCHC 32.6 09/06/2014 0518   RDW 20.5* 09/06/2014 0518   LYMPHSABS 1.3 09/06/2014 0518   MONOABS 0.8 09/06/2014 0518   EOSABS 0.1 09/06/2014 0518   BASOSABS 0.0 09/06/2014 0518     Assessment:  1. New ESRD, outpt spot at  [facility]  TTS 2. Anemia on aranesp 3. 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 Cardiomyopathy  EF 10%  ? Amyloid, for fat pad bx this AM   Plan: 1.  HD tomorrow.  Keep in mind that delaying access placement poses the real risk of sepsis and death from a catheter infection.   2. He is now committed to HD so an ACE/ARB could be used 3. Fat pad bx today Kyle [name] T

## 2014-09-06 NOTE — Progress Notes (Signed)
Kyle Aquas, RN, BS, CDN, from [employer] provided CKD education treatment options today @ 9 am to Kyle [name]. Will visit again at outpatient HD center when discharged to provide additional education.

## 2014-09-06 NOTE — Progress Notes (Signed)
Tray ordered.

## 2014-09-06 NOTE — Procedures (Signed)
Technically successful US guided biopsy of anterior lower abdominal wall fat pad.   No immediate complications.   Ronny Bacon, MD Pager #: [phone]

## 2014-09-06 NOTE — Progress Notes (Signed)
.vbpro   Patient Name: Kyle [name] Date of Encounter: 09/06/2014   SUBJECTIVE  Feels better, denies chest pain, sob or palpitation.   CURRENT MEDS . antiseptic oral rinse  7 mL Mouth Rinse BID  . carvedilol  12.5 mg Oral BID WC  . darbepoetin (ARANESP) injection - DIALYSIS  150 mcg Intravenous Q Tue-HD  . doxercalciferol  2 mcg Intravenous Q T,Th,Sa-HD  . feeding supplement (NEPRO CARB STEADY)  237 mL Oral BID BM  . [START ON 09/07/2014] heparin subcutaneous  5,000 Units Subcutaneous 3 times per day  . hydrALAZINE  25 mg Oral QID  . insulin aspart  0-20 Units Subcutaneous TID AC & HS  . multivitamin  1 tablet Oral QHS  . sodium chloride  10-40 mL Intracatheter Q12H  . sodium chloride  3 mL Intravenous Q12H    OBJECTIVE  Filed Vitals:   09/05/14 2000 09/05/14 2023 09/05/14 2035 09/06/14 0526  BP: 161/100 158/98 143/89 148/74  Pulse: 77 72 79 89  Temp:  97.8 F (36.6 C)  98.5 F (36.9 C)  TempSrc:  Oral  Oral  Resp:  18  16  Height:      Weight:  178 lb 9.2 oz (81 kg)    SpO2:  96%  97%    Intake/Output Summary (Last 24 hours) at 09/06/14 0852 Last data filed at 09/05/14 2023  Gross per 24 hour  Intake      0 ml  Output   3000 ml  Net  -3000 ml   Filed Weights   09/04/14 0800 09/05/14 1712 09/05/14 2023  Weight: 174 lb 2.6 oz (79 kg) 190 lb 4.1 oz (86.3 kg) 178 lb 9.2 oz (81 kg)    PHYSICAL EXAM  General: Pleasant, NAD. Neuro: Alert and oriented X 3. Moves all extremities spontaneously. Psych: Normal affect. HEENT:  Normal  Neck: Supple without bruits or JVD. Rt IJ Permcath. Lungs:  Resp regular and unlabored, CTA. Heart: RRR no s3, s4, or murmurs. Abdomen: Soft, non-tender, non-distended, BS + x 4.  Extremities: No clubbing, cyanosis or edema. DP/PT/Radials 2+ and equal bilaterally.  Accessory Clinical Findings  CBC  Recent Labs  09/05/14 0311 09/06/14 0518  WBC 10.0 10.3  NEUTROABS 7.9* 8.0*  HGB 8.8* 8.6*  HCT 27.7* 26.4*  MCV 86.0 85.4    PLT 249 [id number]   Basic Metabolic Panel  Recent Labs  09/05/14 0311 09/06/14 0518  NA 138 135  K 4.3 3.6  CL 101 100*  CO2 25 27  GLUCOSE 104* 187*  BUN 32* 25*  CREATININE 4.81* 4.22*  CALCIUM 7.6* 7.6*  MG 1.9 1.7  PHOS 2.7 2.1*   Liver Function Tests  Recent Labs  09/05/14 0311 09/06/14 0518  ALBUMIN 2.0* 2.0*    TELE  SR with PVCs.   Radiology/Studies ECHO; 08/22/14 Study Conclusions  - Left ventricle: Myocardium with speckled appearance, consider cardiac amyloidosis (versus ESRD). Smoke noted in LV. The cavity size was normal. Wall thickness was increased in a pattern of severe LVH. The estimated ejection fraction was 10%. Diffuse hypokinesis. Doppler parameters are consistent with abnormal left ventricular relaxation (grade 1 diastolic dysfunction). - Aortic valve: There was no stenosis. There was mild regurgitation. - Mitral valve: There was no significant regurgitation. - Left atrium: The atrium was mildly dilated. - Right ventricle: The cavity size was mildly dilated. Systolic function was severely reduced. - Right atrium: The atrium was mildly dilated. - Atrial septum: There appeared to be left to right flow  across the interatrial septum, ?PFO but not fully visualized. - Pulmonary arteries: No complete TR doppler jet so unable to estimate PA systolic pressure. - Systemic veins: IVC measured 1.6 cm with < 50% respirophasic variation, suggesting RA pressure 8 mmHg. - Pericardium, extracardiac: Large left pleural effusion. A small circumferential pericardial effusion was identified.  Impressions:  - Normal LV size with severe LV hypertrophy. Speckled myocardium suggests possibility of cardiac amyloidosis (versus ESRD). EF 10% with diffuse hypokinesis, smoke noted in the LV. Mildly dilated RV with severe systolic dysfunction. Large left pleural effusion, small pericardial effusion  ASSESSMENT AND PLAN  1.  Cardiomyopathy type undetermined probable hypertensive but yet to be determined, EF 10%.   2. Acute on chronic systolic heart failure compensated - EF 10% on echo. Continue Coreg, not on ACE due to ESRD  3. End-stage renal disease on hemodialysis - Had HD yesterday.  Ok or proceed with permanent fistula placement now.   4. Poorly controlled hypertension- BP improved on hydralazine , still elevated at time - hopefully improves after HD  5. Cardiac arrest PEA intra-operatively post catheter ( Tunneled cath for dialysis)- He is post [employer]. - Cardiac catheterization showed single-vessel obstructive coronary disease involving a nondominant right coronary artery.  There was moderate pulmonary hypertension with normal left ventricular filling pressures.  Medical management was recommended. - Patient is NPO - Likely Biopsy today.   6. Anemia of chronic disease- nephrology added Jene Every PA-C Pager [phone] Agree with above assessment and plan. No chest pain or dyspnea. He will be having his fat pad biopsy later today, to rule out amyloidosis. Lungs are clear. Heart no gallop.

## 2014-09-06 NOTE — Progress Notes (Addendum)
[facility] PROGRESS NOTE  Kyle [name] [id number] DOB: 15-Jul-1955 DOA: 08/20/2014 PCP: No primary care provider on file.  Brief narrative 59 year old male with chronic kidney disease stage V (noncompliance with outpatient follow-up), chronic combined CHF with last EF of 15%, hypertension and type 2 diabetes mellitus initially admitted at [facility] on 7/26 with acute on chronic CHF with acute on chronic kidney disease with serum creatinine of 10.7. He was then transferred to [facility] for placement of tunneled HD catheter to initiate dialysis. He was taken to the OR on 7/27 for dialysis catheter placement under conscious sedation and intraoperatively had bradycardia and then went into PEA with almost 10 minutes of CPR before he was revived. Patient was then transferred to ICU. Did not have prolonged mental status changes post cardiac arrest. Transfer to hospitalist service.   Assessment/Plan: Cardiac arrest During intraoperative tunneled catheter for dialysis placement. Has cardiomyopathy with EF of 10%. Continue Coreg twice a day and diureses with hemodialysis. 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 -Patient underwent anterior abd wall fat pad bx today by IR to rule out amyloidosis.   Severe cardiomyopathy EF of 10%. Continue Coreg and hydralazine. Okay for starting ACE inhibitor per renal as patient is committed for outpatient dialysis. Will start him on lisinopril.  Possible aspiration pneumonia   completed a seven-day course of antibiotics.  Acute hypoxic respiratory failure Following cardiac arrest with acute pulmonary edema. Now currently resolved following dialysis.  End-stage renal disease Initially on on CRRT from 7/27-8/3, now on hemodialysis. - Will need OP HD and perm access prior to discharge , awaiting outpt spot at [facility]. - VVS following, okay by cardiology to proceed  with permanent fistula placement.  Anemia of chronic disease On Aranesp  Acute encephalopathy following cardiac arrest. Now resolved.  Incidental thyroid nodule on CT scan of the neck Needs outpatient follow-up.    Code Status: Full code Family Communication: None at bedside Disposition Plan: Home possibly tomorrow, i if outpt HD set up and ok per renal for outpt permeant HD access placement   Consultants:  The etiology  Renal  IR  Procedures:  HD catheter placement  2-D echo  Cardiac biopsy  Antibiotics: None   HPI/Subjective: Since seen and examined. Denies any chest pain or shortness of breath.  Objective: Filed Vitals:   09/06/14 1358  BP: 139/85  Pulse: 63  Temp:   Resp: 16    Intake/Output Summary (Last 24 hours) at 09/06/14 1627 Last data filed at 09/05/14 2023  Gross per 24 hour  Intake      0 ml  Output   3000 ml  Net  -3000 ml   Filed Weights   09/04/14 0800 09/05/14 1712 09/05/14 2023  Weight: 79 kg (174 lb 2.6 oz) 86.3 kg (190 lb 4.1 oz) 81 kg (178 lb 9.2 oz)    Exam:   General: middle-aged male in no acute distress  HEENT: Pallor present, moist oral mucosa, supple neck  Chest: HD catheter in place, date auscultation bilaterally  Cardiovascular:S1 and S2, no murmurs rub or gallop  GI: Soft, nondistended, nontender, bowel sounds present  Musculoskeletal: him, no edema  CNS: Alert and oriented   Data Reviewed: Basic Metabolic Panel:  Recent Labs Lab 09/02/14 0426 09/03/14 0505 09/04/14 0425 09/04/14 1258 09/05/14 0311 09/06/14 0518  NA 139 137 138  --  138 135  K 3.9 3.8 4.1  --  4.3 3.6  CL  104 101 102  --  101 100*  CO2 24 24 28   --  25 27  GLUCOSE 73 72 107*  --  104* 187*  BUN 39* 45* 24*  --  32* 25*  CREATININE 4.19* 5.03* 3.64* 4.17* 4.81* 4.22*  CALCIUM 8.2* 7.9* 7.6*  --  7.6* 7.6*  MG 2.1 2.0 2.0  --  1.9 1.7  PHOS 3.3 3.6 2.4*  --  2.7 2.1*   Liver Function Tests:  Recent Labs Lab  09/02/14 0426 09/03/14 0505 09/04/14 0425 09/05/14 0311 09/06/14 0518  ALBUMIN 2.1* 2.0* 2.0* 2.0* 2.0*   No results for input(s): LIPASE, AMYLASE in the last 168 hours. No results for input(s): AMMONIA in the last 168 hours. CBC:  Recent Labs Lab 09/02/14 0426 09/03/14 0505 09/04/14 0425 09/04/14 1258 09/05/14 0311 09/06/14 0518  WBC 9.4 9.2 10.4 8.0 10.0 10.3  NEUTROABS 7.3 7.0 8.5*  --  7.9* 8.0*  HGB 9.5* 9.5* 9.4* 9.2* 8.8* 8.6*  HCT 29.3* 28.9* 29.3* 28.5* 27.7* 26.4*  MCV 84.9 83.3 85.4 84.3 86.0 85.4  PLT 288 291 265 245 249 226   Cardiac Enzymes: No results for input(s): CKTOTAL, CKMB, CKMBINDEX, TROPONINI in the last 168 hours. BNP (last 3 results)  Recent Labs  08/20/14 1350  BNP >4500.0*    ProBNP (last 3 results)  Recent Labs  01/14/14 1800  PROBNP 54690.0*    CBG:  Recent Labs Lab 09/04/14 2131 09/05/14 0742 09/05/14 1147 09/06/14 0735 09/06/14 1155  GLUCAP 135* 190* 144* 137* 97    No results found for this or any previous visit (from the past 240 hour(s)).   Studies: Ir US Guide Bx Asp/drain  09/06/2014   INDICATION: Cardiomyopathy of uncertain etiology. Please perform a fat pad biopsy to evaluate for amyloidosis.  EXAM: ULTRASOUND GUIDED FAT PAD BIOPSY  COMPARISON:  None.  MEDICATIONS: None  ANESTHESIA/SEDATION: Conscious sedation was achieved with intravenous Versed and Fentanyl  Total Moderate Sedation time  15 minutes  COMPLICATIONS: None immediate  PROCEDURE: Informed written consent was obtained from the patient after a discussion of the risks, benefits and alternatives to treatment. The patient understands and consents the procedure. A timeout was performed prior to the initiation of the procedure.  The skin overlying the anterior lower abdominal pannus was prepped and draped in usual sterile fashion. The overlying soft tissues were anesthetized with 1% lidocaine with epinephrine. Under direct ultrasound guidance, the subcutaneous  fat 7 core biopsies with an 18 gauge core device under direct ultrasound guidance. Multiple ultrasound images were saved for documentation purposes.  Hemostasis was obtained with manual compression. Post procedural scanning was negative for definitive area of hemorrhage or additional complication. A dressing was placed. The patient tolerated the procedure well without immediate post procedural complication.  IMPRESSION: Technically successful ultrasound guided core needle biopsy of anterior abdominal wall fat pad.   Electronically Signed   By: Sandi Mariscal M.D.   On: 09/06/2014 15:28    Scheduled Meds: . antiseptic oral rinse  7 mL Mouth Rinse BID  . carvedilol  12.5 mg Oral BID WC  . darbepoetin (ARANESP) injection - DIALYSIS  150 mcg Intravenous Q Tue-HD  . doxercalciferol  2 mcg Intravenous Q T,Th,Sa-HD  . feeding supplement (NEPRO CARB STEADY)  237 mL Oral BID BM  . fentaNYL      . [START ON 09/07/2014] heparin subcutaneous  5,000 Units Subcutaneous 3 times per day  . hydrALAZINE  25 mg Oral QID  . insulin aspart  0-20 Units Subcutaneous TID AC & HS  . lidocaine-EPINEPHrine      . midazolam      . multivitamin  1 tablet Oral QHS  . sodium chloride  10-40 mL Intracatheter Q12H  . sodium chloride  3 mL Intravenous Q12H   Continuous Infusions: . sodium chloride Stopped (08/24/14 0300)  . sodium chloride       Time spent: 25 minutes    Gaylon Melchor  [facility] Pager [phone] If 7PM-7AM, please contact night-coverage at [email or web address], password [facility] 09/06/2014, 4:27 PM  LOS: 17 days

## 2014-09-07 DIAGNOSIS — I429 Cardiomyopathy, unspecified: Secondary | ICD-10-CM | POA: Insufficient documentation

## 2014-09-07 DIAGNOSIS — I2510 Atherosclerotic heart disease of native coronary artery without angina pectoris: Secondary | ICD-10-CM | POA: Insufficient documentation

## 2014-09-07 LAB — GLUCOSE, CAPILLARY
GLUCOSE-CAPILLARY: 303 mg/dL — AB (ref 65–99)
Glucose-Capillary: 248 mg/dL — ABNORMAL HIGH (ref 65–99)
Glucose-Capillary: 221 mg/dL — ABNORMAL HIGH (ref 65–99)

## 2014-09-07 LAB — CBC WITH DIFFERENTIAL/PLATELET
BASOS ABS: 0 10*3/uL (ref 0.0–0.1)
Basophils Relative: 0 % (ref 0–1)
Eosinophils Absolute: 0.1 10*3/uL (ref 0.0–0.7)
Eosinophils Relative: 2 % (ref 0–5)
HEMATOCRIT: 27.4 % — AB (ref 39.0–52.0)
HEMOGLOBIN: 8.6 g/dL — AB (ref 13.0–17.0)
LYMPHS ABS: 1.2 10*3/uL (ref 0.7–4.0)
Lymphocytes Relative: 15 % (ref 12–46)
MCH: 26.6 pg (ref 26.0–34.0)
MCHC: 31.4 g/dL (ref 30.0–36.0)
MCV: 84.8 fL (ref 78.0–100.0)
Monocytes Absolute: 0.7 10*3/uL (ref 0.1–1.0)
Monocytes Relative: 9 % (ref 3–12)
Neutro Abs: 6.1 10*3/uL (ref 1.7–7.7)
Neutrophils Relative %: 74 % (ref 43–77)
Platelets: 235 10*3/uL (ref 150–400)
RBC: 3.23 MIL/uL — ABNORMAL LOW (ref 4.22–5.81)
RDW: 20.9 % — AB (ref 11.5–15.5)
WBC: 8.1 10*3/uL (ref 4.0–10.5)

## 2014-09-07 LAB — RENAL FUNCTION PANEL
ALBUMIN: 2.1 g/dL — AB (ref 3.5–5.0)
Anion gap: 8 (ref 5–15)
BUN: 40 mg/dL — AB (ref 6–20)
CO2: 26 mmol/L (ref 22–32)
Calcium: 7.9 mg/dL — ABNORMAL LOW (ref 8.9–10.3)
Chloride: 100 mmol/L — ABNORMAL LOW (ref 101–111)
Creatinine, Ser: 5.34 mg/dL — ABNORMAL HIGH (ref 0.61–1.24)
GFR calc Af Amer: 12 mL/min — ABNORMAL LOW (ref >60)
GFR, EST NON AFRICAN AMERICAN: 11 mL/min — AB (ref >60)
Glucose, Bld: 200 mg/dL — ABNORMAL HIGH (ref 65–99)
POTASSIUM: 3.9 mmol/L (ref 3.5–5.1)
Phosphorus: 2.5 mg/dL (ref 2.5–4.6)
Sodium: 134 mmol/L — ABNORMAL LOW (ref 135–145)

## 2014-09-07 LAB — MAGNESIUM: MAGNESIUM: 1.7 mg/dL (ref 1.7–2.4)

## 2014-09-07 LAB — LIPID PANEL
Cholesterol: 158 mg/dL (ref 0–200)
HDL: 46 mg/dL (ref >40)
LDL CALC: 89 mg/dL (ref 0–99)
Total CHOL/HDL Ratio: 3.4 RATIO
Triglycerides: 116 mg/dL (ref <150)
VLDL: 23 mg/dL (ref 0–40)

## 2014-09-07 MED ORDER — LISINOPRIL 10 MG PO TABS: 10.0000 mg | ORAL_TABLET | Freq: Every day | ORAL | Status: DC

## 2014-09-07 MED ORDER — DOXERCALCIFEROL 4 MCG/2ML IV SOLN
INTRAVENOUS | Status: AC
  Administered 2014-09-07: 2 ug via INTRAVENOUS
  Filled 2014-09-07: qty 2

## 2014-09-07 MED ORDER — CARVEDILOL 12.5 MG PO TABS: 12.5000 mg | ORAL_TABLET | Freq: Two times a day (BID) | ORAL | Status: DC

## 2014-09-07 MED ORDER — RENA-VITE PO TABS: 1.0000 | ORAL_TABLET | Freq: Every day | ORAL | Status: DC

## 2014-09-07 MED ORDER — NEPRO/CARBSTEADY PO LIQD: 237.0000 mL | Freq: Two times a day (BID) | ORAL | Status: DC

## 2014-09-07 MED ORDER — HYDRALAZINE HCL 25 MG PO TABS: 50.0000 mg | ORAL_TABLET | Freq: Three times a day (TID) | ORAL | Status: DC

## 2014-09-07 NOTE — Consult Note (Addendum)
Pt apparently cleared by Cardiology to have access place.  I will leave a message with our office to arrange this with Dr Bridgett Larsson as outpt since being discharged today.  Most likely this will be Thursday August 18  Ruta Hinds, MD [facility] Office: [phone] Pager: [phone]

## 2014-09-07 NOTE — Progress Notes (Signed)
CM went to patient's room to speak about discharge planning but pt is still in HD. CM will continue to monitor.

## 2014-09-07 NOTE — Progress Notes (Signed)
Primary cardiologist: Dr. Lyman Bishop  Seen for followup: Cardiomyopathy, CAD  Subjective:    Currently in hemodialysis. No chest pain or dyspnea at rest.  Objective:   Temp:  [97.9 F (36.6 C)-98.9 F (37.2 C)] 97.9 F (36.6 C) (08/13 0940) Pulse Rate:  [62-92] 69 (08/13 1300) Resp:  [14-18] 16 (08/13 0940) BP: (116-164)/(62-91) 127/77 mmHg (08/13 1300) SpO2:  [95 %-100 %] 95 % (08/13 0940) Weight:  [177 lb 14.6 oz (80.7 kg)] 177 lb 14.6 oz (80.7 kg) (08/13 0940) Last BM Date: 09/06/14  Filed Weights   09/05/14 1712 09/05/14 2023 09/07/14 0940  Weight: 190 lb 4.1 oz (86.3 kg) 178 lb 9.2 oz (81 kg) 177 lb 14.6 oz (80.7 kg)    Intake/Output Summary (Last 24 hours) at 09/07/14 1320 Last data filed at 09/07/14 0949  Gross per 24 hour  Intake    253 ml  Output      0 ml  Net    253 ml    Telemetry: Sinus rhythm.  Exam:  General: Appears comfortable.  Lungs: Clear, nonlabored.  Cardiac: RRR without gallop.  Extremities: No pitting.  Lab Results:  Basic Metabolic Panel:  Recent Labs Lab 09/05/14 0311 09/06/14 0518 09/07/14 0551  NA 138 135 134*  K 4.3 3.6 3.9  CL 101 100* 100*  CO2 25 27 26   GLUCOSE 104* 187* 200*  BUN 32* 25* 40*  CREATININE 4.81* 4.22* 5.34*  CALCIUM 7.6* 7.6* 7.9*  MG 1.9 1.7 1.7    Liver Function Tests:  Recent Labs Lab 09/05/14 0311 09/06/14 0518 09/07/14 0551  ALBUMIN 2.0* 2.0* 2.1*    CBC:  Recent Labs Lab 09/05/14 0311 09/06/14 0518 09/07/14 0551  WBC 10.0 10.3 8.1  HGB 8.8* 8.6* 8.6*  HCT 27.7* 26.4* 27.4*  MCV 86.0 85.4 84.8  PLT 249 226 235    Echocardiogram 08/22/2014: Study Conclusions  - Left ventricle: Myocardium with speckled appearance, consider cardiac amyloidosis (versus ESRD). Smoke noted in LV. The cavity size was normal. Wall thickness was increased in a pattern of severe LVH. The estimated ejection fraction was 10%. Diffuse hypokinesis. Doppler parameters are consistent  with abnormal left ventricular relaxation (grade 1 diastolic dysfunction). - Aortic valve: There was no stenosis. There was mild regurgitation. - Mitral valve: There was no significant regurgitation. - Left atrium: The atrium was mildly dilated. - Right ventricle: The cavity size was mildly dilated. Systolic function was severely reduced. - Right atrium: The atrium was mildly dilated. - Atrial septum: There appeared to be left to right flow across the interatrial septum, ?PFO but not fully visualized. - Pulmonary arteries: No complete TR doppler jet so unable to estimate PA systolic pressure. - Systemic veins: IVC measured 1.6 cm with < 50% respirophasic variation, suggesting RA pressure 8 mmHg. - Pericardium, extracardiac: Large left pleural effusion. A small circumferential pericardial effusion was identified.  Impressions:  - Normal LV size with severe LV hypertrophy. Speckled myocardium suggests possibility of cardiac amyloidosis (versus ESRD). EF 10% with diffuse hypokinesis, smoke noted in the LV. Mildly dilated RV with severe systolic dysfunction. Large left pleural effusion, small pericardial effusion.  Medications:   Scheduled Medications: . antiseptic oral rinse  7 mL Mouth Rinse BID  . carvedilol  12.5 mg Oral BID WC  . darbepoetin (ARANESP) injection - DIALYSIS  150 mcg Intravenous Q Tue-HD  . doxercalciferol  2 mcg Intravenous Q T,Th,Sa-HD  . feeding supplement (NEPRO CARB STEADY)  237 mL Oral BID BM  .  heparin subcutaneous  5,000 Units Subcutaneous 3 times per day  . hydrALAZINE  25 mg Oral QID  . insulin aspart  0-20 Units Subcutaneous TID AC & HS  . lisinopril  10 mg Oral Daily  . multivitamin  1 tablet Oral QHS  . sodium chloride  10-40 mL Intracatheter Q12H  . sodium chloride  3 mL Intravenous Q12H    Infusions: . sodium chloride Stopped (08/24/14 0300)  . sodium chloride      PRN Medications: sodium chloride, sodium chloride,  sodium chloride, heparin, hydrALAZINE, lidocaine (PF), lidocaine-prilocaine, [DISCONTINUED] ondansetron **OR** ondansetron (ZOFRAN) IV, pentafluoroprop-tetrafluoroeth, sodium chloride, sodium chloride   Assessment:   1. 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 LVEF 10%.  2. Acute on chronic systolic heart failure compensated - EF 10% on echocardiogram. Continue Coreg and Hydralazine.  3.End-stage renal disease on hemodialysis - HD today. Ok or proceed with permanent fistula placement.   4.Essential hypertension.  5.Cardiac arrest PEA intra-operatively post catheter (Tunneled cath for dialysis) -He is post [employer]. Cardiac catheterization showed single-vessel obstructive coronary disease involving a nondominant right coronary artery. There was moderate pulmonary hypertension with normal left ventricular filling pressures. Medical management was recommended.  6. Anemia of chronic disease.   Plan/Discussion:    Continue medical therapy. Discussion in chart noted about possible home discharge, although not certain about details of timing. For VVS access placement, still pending.    Satira Sark, M.D., F.A.C.C.

## 2014-09-07 NOTE — Progress Notes (Signed)
S: No new CO  Had fat pad bx yest O:BP 142/72 mmHg  Pulse 89  Temp(Src) 98.9 F (37.2 C) (Oral)  Resp 16  Ht 6' (1.829 m)  Wt 81 kg (178 lb 9.2 oz)  BMI 24.21 kg/m2  SpO2 96%  Intake/Output Summary (Last 24 hours) at 09/07/14 0738 Last data filed at 09/06/14 2141  Gross per 24 hour  Intake     13 ml  Output      0 ml  Net     13 ml   Weight change:  [id number] and alert CVS:RRR Resp: clear Abd:+ BS NTND Ext: No edema NEURO: CNI Ox3, no asterixis Rt IJ Permcath   . antiseptic oral rinse  7 mL Mouth Rinse BID  . carvedilol  12.5 mg Oral BID WC  . darbepoetin (ARANESP) injection - DIALYSIS  150 mcg Intravenous Q Tue-HD  . doxercalciferol  2 mcg Intravenous Q T,Th,Sa-HD  . feeding supplement (NEPRO CARB STEADY)  237 mL Oral BID BM  . heparin subcutaneous  5,000 Units Subcutaneous 3 times per day  . hydrALAZINE  25 mg Oral QID  . insulin aspart  0-20 Units Subcutaneous TID AC & HS  . lisinopril  10 mg Oral Daily  . multivitamin  1 tablet Oral QHS  . sodium chloride  10-40 mL Intracatheter Q12H  . sodium chloride  3 mL Intravenous Q12H   Ir US Guide Bx Asp/drain  09/06/2014   INDICATION: Cardiomyopathy of uncertain etiology. Please perform a fat pad biopsy to evaluate for amyloidosis.  EXAM: ULTRASOUND GUIDED FAT PAD BIOPSY  COMPARISON:  None.  MEDICATIONS: None  ANESTHESIA/SEDATION: Conscious sedation was achieved with intravenous Versed and Fentanyl  Total Moderate Sedation time  15 minutes  COMPLICATIONS: None immediate  PROCEDURE: Informed written consent was obtained from the patient after a discussion of the risks, benefits and alternatives to treatment. The patient understands and consents the procedure. A timeout was performed prior to the initiation of the procedure.  The skin overlying the anterior lower abdominal pannus was prepped and draped in usual sterile fashion. The overlying soft tissues were anesthetized with 1% lidocaine with epinephrine. Under direct ultrasound  guidance, the subcutaneous fat 7 core biopsies with an 18 gauge core device under direct ultrasound guidance. Multiple ultrasound images were saved for documentation purposes.  Hemostasis was obtained with manual compression. Post procedural scanning was negative for definitive area of hemorrhage or additional complication. A dressing was placed. The patient tolerated the procedure well without immediate post procedural complication.  IMPRESSION: Technically successful ultrasound guided core needle biopsy of anterior abdominal wall fat pad.   Electronically Signed   By: Sandi Mariscal M.D.   On: 09/06/2014 15:28   BMET    Component Value Date/Time   NA 135 09/06/2014 0518   K 3.6 09/06/2014 0518   CL 100* 09/06/2014 0518   CO2 27 09/06/2014 0518   GLUCOSE 187* 09/06/2014 0518   BUN 25* 09/06/2014 0518   CREATININE 4.22* 09/06/2014 0518   CALCIUM 7.6* 09/06/2014 0518   GFRNONAA 14* 09/06/2014 0518   GFRAA 16* 09/06/2014 0518   CBC    Component Value Date/Time   WBC 10.3 09/06/2014 0518   RBC 3.09* 09/06/2014 0518   RBC 3.59* 01/15/2014 0415   HGB 8.6* 09/06/2014 0518   HCT 26.4* 09/06/2014 0518   PLT 226 09/06/2014 0518   MCV 85.4 09/06/2014 0518   MCH 27.8 09/06/2014 0518   MCHC 32.6 09/06/2014 0518   RDW 20.5* 09/06/2014  0518   LYMPHSABS 1.3 09/06/2014 0518   MONOABS 0.8 09/06/2014 0518   EOSABS 0.1 09/06/2014 0518   BASOSABS 0.0 09/06/2014 0518     Assessment:  1. New ESRD, outpt spot at [facility]  TTS 2. Anemia on aranesp 3. 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 Cardiomyopathy  EF 10%  ? Amyloid, SP fat pad bx   Plan: 1.  HD today.  I would like cardiology to OK his accees surgery so VVS can get it scheduled and it can be placed sooner rather than later.  If plan is to DC then will need this set up as outpt     Agustin Swatek T

## 2014-09-07 NOTE — Discharge Instructions (Addendum)
Cardiomyopathy Cardiomyopathy means a disease of the heart muscle. The heart muscle becomes enlarged or stiff. The heart is not able to pump enough blood or deliver enough oxygen to the body. This leads to heart failure and is the number one reason for heart transplants.  TYPES OF CARDIOMYOPATHY INCLUDE: DILATED  The most common type. The heart muscle is stretched out and weak so there is less blood pumped out.   Some causes:  Disease of the arteries of the heart (ischemia).  Heart attack with muscle scar.  Leaky or damaged valves.  After a viral illness.  Smoking.  High cholesterol.  Diabetes or overactive thyroid.  Alcohol or drug abuse.  High blood pressure.  May be reversible. HYPERTROPHIC The heart muscle grows bigger so there is less room for blood in the ventricle, and not enough blood is pumped out.   Causes include:  Mitral valve leaks.  Inherited tendency (from your family).  No explanation (idiopathic).  May be a cause of sudden death in young athletes with no symptoms. RESTRICTIVE The heart muscle becomes stiff, but not always larger. The heart has to work harder and will get weaker. Abnormal heart beats or rhythm (arrhythmia) are common.  Some causes:  Diseases in other parts of the body which may produce abnormal deposits in the heart muscle.  Probably not inherited.  A result of radiation treatment for cancer. SYMPTOMS OF ALL TYPES:  Less able to exercise or tolerate physical activity.  Palpitations.  Irregular heart beat, heart arrhythmias.  Shortness of breath, even at rest.  Chest pain.  Lightheadedness or fainting. TREATMENT  Life-style changes including reducing salt, lowering cholesterol, stop smoking.  Manage contributing causes with medications.  Medicines to help reduce the fluids in the body.  An implanted cardioverter defibrillator (ICD) to improve heart function and correct arrhythmias.  Medications to relax the blood  vessels and make it easier for the heart to pump.  Drugs that help regulate heart beat and improve heart relaxation, reducing the work of the heart.  Myomectomy for patients with hypertrophic cardiomyopathy and severe problems. This is a surgical procedure that removes a portion of the thickened muscle wall in order to improve heart output and provide symptom relief.  A heart transplant is an option in carefully applied circumstances. SEEK IMMEDIATE MEDICAL CARE IF:   You have severe chest pain, especially if the pain is crushing or pressure-like and spreads to the arms, back, neck, or jaw, or if you have sweating, feeling sick to your stomach (nausea), or shortness of breath. THIS IS AN EMERGENCY. Do not wait to see if the pain will go away. Get medical help at once. Call your local emergency services (911 in [place] DO NOT drive yourself to the hospital.  You develop severe shortness of breath.  You begin to cough up bloody sputum.  You are unable to sleep because you cannot breathe.  You gain weight due to fluid retention.  You develop painful swelling in your calf or leg.  You feel your heart racing and it does not go away or happens when you are resting. Document Released: 03/26/2004 Document Revised: 04/05/2011 Document Reviewed: 08/30/2007 [facility] Patient Information 2015 [place], [place]. This information is not intended to replace advice given to you by your health care provider. Make sure you discuss any questions you have with your health care provider.  Kidney Failure Kidney failure happens when the kidneys cannot remove waste and excess fluid that naturally builds up in your blood after  your body breaks down food. This leads to a dangerous buildup of waste products and fluid in the blood. HOME CARE  Follow your diet as told by your doctor.  Take all medicines as told by your doctor.  Keep all of your dialysis appointments. Call if you are unable to keep an  appointment. GET HELP RIGHT AWAY IF:   You make a lot more or very little pee (urine).  Your face or ankles puff up (swell).  You develop shortness of breath.  You develop weakness, feel tired, or you do not feel hungry (appetite loss).  You feel poorly for no known reason. MAKE SURE YOU:   Understand these instructions.  Will watch your condition.  Will get help right away if you are not doing well or get worse. Document Released: 04/07/2009 Document Revised: 04/05/2011 Document Reviewed: 05/14/2009 [facility] Patient Information 2015 [place], [place]. This information is not intended to replace advice given to you by your health care provider. Make sure you discuss any questions you have with your health care provider.

## 2014-09-07 NOTE — Discharge Summary (Addendum)
Physician Discharge Summary  Kyle [name] [id number] DOB: 1955-05-31 DOA: 08/20/2014  PCP: No primary care provider on file.  Admit date: 08/20/2014 Discharge date: 09/07/2014  Time spent: 35  minutes  Recommendations for Outpatient Follow-up:  1. Discharge home with outpt [facility]. Please follow fat pad biopsy from 8/12 to r/o amyloid deposition. 2. Needs follow up with cardiology in 2 weeks. 3. Follow up with scheduled HD at [facility]. 4. Follow up with vascular surgery for permanent HD access ( will be called from office on 8/15) 5. Needs outpt thyroid ultrasound to evaluate incidental 2.5 cm thyroid nodule seen on cervical CT.   Discharge Diagnoses:  Principal Problem:   Cardiac arrest  Active Problems:   Acute on chronic combined systolic and diastolic CHF, NYHA class 2   Benign essential HTN   DM (diabetes mellitus), type 2, uncontrolled, with renal complications   Troponin level elevated   Anemia of chronic disease   Cardiorenal syndrome with renal failure   Ascites   Cardiac arrest   Pressure ulcer   Acute respiratory failure with hypoxia   NSVT (nonsustained ventricular tachycardia)   ESRD (end stage renal disease) on dialysis   Discharge Condition: fair  Diet recommendation: heart healthy/ renal  Filed Weights   09/04/14 0800 09/05/14 1712 09/05/14 2023  Weight: 79 kg (174 lb 2.6 oz) 86.3 kg (190 lb 4.1 oz) 81 kg (178 lb 9.2 oz)    History of present illness:   59 year old male with chronic kidney disease stage V (noncompliance with outpatient follow-up), chronic combined CHF with last EF of 15%, hypertension and type 2 diabetes mellitus initially admitted at [facility] on 7/26 with acute on chronic CHF with acute on chronic kidney disease with serum creatinine of 10.7. He was then transferred to [facility] for placement of tunneled HD catheter to initiate dialysis. He was taken to the OR on 7/27 for dialysis catheter  placement under conscious sedation and intraoperatively had bradycardia and then went into PEA with almost 10 minutes of CPR before he was revived. Patient was then transferred to ICU. Did not have prolonged mental status changes post cardiac arrest. Transferred to hospitalist service.furher evaluation showing severe cardiomyopathy with EF of 10%.. Patient started on scheduled HD with outpt HD set up.     Hospital Course:  Cardiac arrest During intraoperative tunneled catheter for dialysis placement. Has cardiomyopathy with EF of 10%.      Severe cardiomyopathy EF of 10%. Okay for starting ACE inhibitor per renal as patient is committed for outpatient dialysis.  started him on lisinopril. Lipid panel within normal range. Continue Coreg twice a day and diureses with hemodialysis. continue hydralazine. ( dose adjusted).continue ASA.  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 abdominal wall fat pad biopsy done on 8/12 to r/o amyloid. . Follow result as outpt  Possible aspiration pneumonia  completed a seven-day course of antibiotics.  Acute hypoxic respiratory failure Following cardiac arrest with acute pulmonary edema. Now currently resolved following dialysis.  End-stage renal disease Initially on on CRRT from 7/27-8/3, now on hemodialysis. Has HD catheter. - Will need OP HD and perm access . Spoke with vascular surgeon Dr Loree Fee who will arrange for outpt follow up within 1 week. -cleared  by cardiology to proceed with permanent fistula placement.  Anemia of chronic disease On Aranesp  Acute encephalopathy following cardiac arrest. Now resolved.  Incidental thyroid nodule on CT scan  of the neck Needs outpatient follow-up.  Type 2 DM  controlled. continue glipizide.   Code Status: Full code Family Communication: wife at bedside Disposition Plan: Home with outpt follow up   Consultants:  The  etiology  Renal  IR  Procedures:  HD catheter placement  2-D echo  Cardiac biopsy  Antibiotics: Completed for possible aspiration  Discharge Exam: Filed Vitals:   09/07/14 0556  BP: 142/72  Pulse: 89  Temp: 98.9 F (37.2 C)  Resp: 16     General:  no acute distress  HEENT: Pallor present, moist oral mucosa, supple neck  Chest: HD catheter in place, date auscultation bilaterally  Cardiovascular:S1 and S2, no murmurs rub or gallop  GI: Soft, nondistended, nontender, bowel sounds present  Musculoskeletal: warm no edema  CNS: Alert and oriented  Discharge Instructions    Current Discharge Medication List    START taking these medications   Details  lisinopril (PRINIVIL,ZESTRIL) 10 MG tablet Take 1 tablet (10 mg total) by mouth daily. Qty: 30 tablet, Refills: 0    multivitamin (RENA-VIT) TABS tablet Take 1 tablet by mouth at bedtime. Qty: 30 tablet, Refills: 0    Nutritional Supplements (FEEDING SUPPLEMENT, NEPRO CARB STEADY,) LIQD Take 237 mLs by mouth 2 (two) times daily between meals. Qty: 60 Can, Refills: 0      CONTINUE these medications which have CHANGED   Details  carvedilol (COREG) 12.5 MG tablet Take 1 tablet (12.5 mg total) by mouth 2 (two) times daily with a meal. Qty: 60 tablet, Refills: 1    hydrALAZINE (APRESOLINE) 25 MG tablet Take 2 tablets (50 mg total) by mouth 3 (three) times daily. Qty: 120 tablet, Refills: 0      CONTINUE these medications which have NOT CHANGED   Details  aspirin 81 MG chewable tablet Chew 1 tablet (81 mg total) by mouth daily.    calcitRIOL (ROCALTROL) 0.25 MCG capsule Take 1 capsule (0.25 mcg total) by mouth every Monday, Wednesday, and Friday. Qty: 30 capsule, Refills: 1    ferrous sulfate 325 (65 FE) MG tablet Take 1 tablet (325 mg total) by mouth daily with breakfast. Qty: 30 tablet, Refills: 3    glimepiride (AMARYL) 2 MG tablet Take 1 tablet (2 mg total) by mouth daily with breakfast. Qty: 30  tablet, Refills: 1      STOP taking these medications     furosemide (LASIX) 80 MG tablet      isosorbide dinitrate (ISORDIL) 20 MG tablet      isosorbide-hydrALAZINE (BIDIL) 20-37.5 MG per tablet        No Known Allergies Follow-up Information    Follow up with [facility]. Go on 09/10/2014.   Why:  for follow up @ 10:00am    Contact information:   [place] [phone]      Follow up with Warren Danes, MD. Call in 2 weeks.   Specialty:  Cardiology   Why:  please call offcie to confirm your appointment   Contact information:   [address] [place] [address] [phone]       Follow up with Ruta Hinds, MD.   Specialties:  Vascular Surgery, Cardiology   Why:  office will call for appointment early next week. please call the office within 1 week if you do not get a call.   Contact information:   [address] [place] [address] [phone]  The results of significant diagnostics from this hospitalization (including imaging, microbiology, ancillary and laboratory) are listed below for reference.    Significant Diagnostic Studies: Dg Chest 2 View  08/20/2014   CLINICAL DATA:  Lower extremity edema, CHF and anasarca. History of renal failure.  EXAM: CHEST  2 VIEW  COMPARISON:  01/14/2014 and 06/20/2008 chest radiographs  FINDINGS: Small-moderate bilateral pleural effusions and bibasilar atelectasis again noted.  Mild pulmonary vascular congestion is present.  The visualized cardiomediastinal silhouette is unchanged.  There is no evidence of pneumothorax.  No acute bony abnormalities are identified.  IMPRESSION: Small-moderate bilateral pleural effusions with bibasilar atelectasis.  Mild pulmonary vascular congestion.   Electronically Signed   By: Margarette Canada M.D.   On: 08/20/2014 15:01   Ct Head Wo Contrast  08/29/2014   CLINICAL DATA:  Trauma. Unwitnessed fall. Confusion and  agitation today. Initial encounter.  EXAM: CT HEAD WITHOUT CONTRAST  CT CERVICAL SPINE WITHOUT CONTRAST  TECHNIQUE: Multidetector CT imaging of the head and cervical spine was performed following the standard protocol without intravenous contrast. Multiplanar CT image reconstructions of the cervical spine were also generated.  COMPARISON:  None.  FINDINGS: CT HEAD FINDINGS  Study is mildly motion degraded. Punctate hyperdense focus near the foramen of Magendie is consistent with calcification rather than hemorrhage. There is no evidence of acute intracranial hemorrhage. Ventricles and sulci are normal in size for age. There is no evidence of acute large territory infarct, mass, midline shift, or extra-axial fluid collection. Periventricular white-matter hypodensities are nonspecific but compatible with mild chronic small vessel ischemic disease.  Orbits are unremarkable. The visualized mastoid air cells are clear. There is mild right frontal and bilateral ethmoid sinus mucosal thickening. No skull fracture is identified.  CT CERVICAL SPINE FINDINGS  There is mild motion artifact through the mid to lower cervical spine despite repeating the study. There is straightening of the normal cervical lordosis. There is no listhesis. Moderate to severe disc space narrowing is present from C4-5 to C6-7 with associated degenerative endplate changes. No cervical spine fracture is identified. Moderate spinal stenosis is suspected at C4-5 and C5-6 due to broad-based posterior disc osteophyte complexes, and there is moderate to severe neural foraminal stenosis on the left at C4-5 and on the right at C5-6.  Anasarca is noted. A right jugular central venous catheter is partially visualized. A 2.5 cm hypoattenuating nodule is noted in the right thyroid lobe. Bilateral pleural effusions are partially visualized. Skin staples are noted in the right lower neck.  IMPRESSION: 1. Mildly motion degraded examination. 2. No evidence of acute  intracranial abnormality. 3. No acute osseous abnormality identified in the cervical spine. Multilevel cervical disc degeneration. 4. Anasarca and bilateral pleural effusions, incompletely visualized. 5. 2.5 cm right thyroid nodule. Further evaluation with outpatient thyroid ultrasound is suggested.   Electronically Signed   By: Logan Bores   On: 08/29/2014 15:32   Ct Cervical Spine Wo Contrast  08/29/2014   CLINICAL DATA:  Trauma. Unwitnessed fall. Confusion and agitation today. Initial encounter.  EXAM: CT HEAD WITHOUT CONTRAST  CT CERVICAL SPINE WITHOUT CONTRAST  TECHNIQUE: Multidetector CT imaging of the head and cervical spine was performed following the standard protocol without intravenous contrast. Multiplanar CT image reconstructions of the cervical spine were also generated.  COMPARISON:  None.  FINDINGS: CT HEAD FINDINGS  Study is mildly motion degraded. Punctate hyperdense focus near the foramen of Magendie is consistent with calcification rather than hemorrhage. There is no evidence of acute  intracranial hemorrhage. Ventricles and sulci are normal in size for age. There is no evidence of acute large territory infarct, mass, midline shift, or extra-axial fluid collection. Periventricular white-matter hypodensities are nonspecific but compatible with mild chronic small vessel ischemic disease.  Orbits are unremarkable. The visualized mastoid air cells are clear. There is mild right frontal and bilateral ethmoid sinus mucosal thickening. No skull fracture is identified.  CT CERVICAL SPINE FINDINGS  There is mild motion artifact through the mid to lower cervical spine despite repeating the study. There is straightening of the normal cervical lordosis. There is no listhesis. Moderate to severe disc space narrowing is present from C4-5 to C6-7 with associated degenerative endplate changes. No cervical spine fracture is identified. Moderate spinal stenosis is suspected at C4-5 and C5-6 due to broad-based  posterior disc osteophyte complexes, and there is moderate to severe neural foraminal stenosis on the left at C4-5 and on the right at C5-6.  Anasarca is noted. A right jugular central venous catheter is partially visualized. A 2.5 cm hypoattenuating nodule is noted in the right thyroid lobe. Bilateral pleural effusions are partially visualized. Skin staples are noted in the right lower neck.  IMPRESSION: 1. Mildly motion degraded examination. 2. No evidence of acute intracranial abnormality. 3. No acute osseous abnormality identified in the cervical spine. Multilevel cervical disc degeneration. 4. Anasarca and bilateral pleural effusions, incompletely visualized. 5. 2.5 cm right thyroid nodule. Further evaluation with outpatient thyroid ultrasound is suggested.   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  PROCEDURE: An ultrasound guided paracentesis was thoroughly discussed with the patient and questions answered. The benefits, risks, alternatives and complications were also discussed. The patient understands and wishes to proceed with the procedure. Written consent was obtained.  Ultrasound was performed to localize and mark an adequate pocket of fluid in the left lower quadrant of the abdomen. The area was then prepped and draped in the normal sterile fashion. 1% Lidocaine was used for local anesthesia. Under ultrasound guidance a 19 gauge Yueh catheter was introduced. Paracentesis was performed. The catheter was removed and a dressing applied.  COMPLICATIONS: None.  FINDINGS: A total of approximately 2.5 liters of clear yellow fluid was removed. A fluid sample was sent for laboratory analysis.  IMPRESSION: Successful ultrasound guided paracentesis yielding 2.5 liters of  ascites.  Read by:  Gareth Eagle, PA-C   Electronically Signed   By: Sandi Mariscal M.D.   On: 08/21/2014 14:24   Ir US Guide Bx Asp/drain  09/06/2014   INDICATION: Cardiomyopathy of uncertain etiology. Please perform a fat pad biopsy to evaluate for amyloidosis.  EXAM: ULTRASOUND GUIDED FAT PAD BIOPSY  COMPARISON:  None.  MEDICATIONS: None  ANESTHESIA/SEDATION: Conscious sedation was achieved with intravenous Versed and Fentanyl  Total Moderate Sedation time  15 minutes  COMPLICATIONS: None immediate  PROCEDURE: Informed written consent was obtained from the patient after a discussion of the risks, benefits and alternatives to treatment. The patient understands and consents the procedure. A timeout was performed prior to the initiation of the procedure.  The skin overlying the anterior lower abdominal pannus was prepped and draped in usual sterile fashion. The overlying soft tissues were anesthetized with 1% lidocaine with epinephrine. Under direct ultrasound guidance, the subcutaneous fat  7 core biopsies with an 18 gauge core device under direct ultrasound guidance. Multiple ultrasound images were saved for documentation purposes.  Hemostasis was obtained with manual compression. Post procedural scanning was negative for definitive area of hemorrhage or additional complication. A dressing was placed. The patient tolerated the procedure well without immediate post procedural complication.  IMPRESSION: Technically successful ultrasound guided core needle biopsy of anterior abdominal wall fat pad.   Electronically Signed   By: Sandi Mariscal M.D.   On: 09/06/2014 15:28   Dg Chest Port 1 View  08/26/2014   CLINICAL DATA:  CHF, acute respiratory failure, anasarca  EXAM: PORTABLE CHEST - 1 VIEW  COMPARISON:  Portable chest x-ray of August 25, 2014  FINDINGS: The lungs remain hypoinflated. There are persistent bilateral pleural effusions as well bibasilar atelectasis or infiltrate. There is no pneumothorax. The cardiac  silhouette is largely obscured. The central pulmonary vascularity is more distinct today. The endotracheal tube tip lies 2 cm above the carina. The esophagogastric tube tip projects below the inferior margin of the image. The left internal jugular venous catheter tip projects over the midportion of the SVC. The large caliber dual-lumen right internal jugular catheter projects over lower third of the SVC.  IMPRESSION: Slight interval improvement in the appearance of the will pulmonary interstitium and central pulmonary vascularity may reflect improving pulmonary edema. Bilateral pleural effusions and bibasilar atelectasis or infiltrate persists. The support tubes are in reasonable position.   Electronically Signed   By: David  Martinique M.D.   On: 08/26/2014 07:12   Dg Chest Port 1 View  08/25/2014   CLINICAL DATA:  Cardiac arrest  EXAM: PORTABLE CHEST - 1 VIEW  COMPARISON:  Radiograph 08/24/2014  FINDINGS: Endotracheal tube is approximately 1 cm from the carina. LEFT central venous line and NG tube are unchanged. There are low lung volumes and basilar atelectasis. Small effusions noted. Central venous congestion present. No pneumothorax.  IMPRESSION: 1. Endotracheal tube is relatively low approximately 1 cm from carina. No significant change. 2. Low lung volumes, bibasilar atelectasis and small effusions.   Electronically Signed   By: Suzy Bouchard M.D.   On: 08/25/2014 09:09   Dg Chest Port 1 View  08/24/2014   CLINICAL DATA:  Acute respiratory failure, hypoxia. Exchange of endotracheal tube.  EXAM: PORTABLE CHEST - 1 VIEW  COMPARISON:  08/24/2014  FINDINGS: Endotracheal tube is 2.5 cm above the carina. Left central line and right dialysis catheter is well is NG tube are unchanged.  Bilateral perihilar and lower lobe opacities with layering effusions, unchanged.  IMPRESSION: Endotracheal tube 2.5 cm above the carina. Otherwise no significant change.   Electronically Signed   By: Rolm Baptise M.D.   On:  08/24/2014 18:29   Dg Chest Port 1 View  08/24/2014   CLINICAL DATA:  Respiratory failure  EXAM: PORTABLE CHEST - 1 VIEW  COMPARISON:  08/23/2014  FINDINGS: Layering moderate right pleural effusion. Small left pleural effusion.  Associated bilateral lower lobe opacities, likely atelectasis.  Pulmonary vascular congestion without frank interstitial edema.  Endotracheal tube terminates 5.5 cm above the carina.  The heart is normal in size.  Right IJ dual lumen dialysis catheter with its tip in the right atrium. Left IJ venous catheter with its tip in the mid SVC.  Enteric tube courses below the diaphragm.  IMPRESSION: Pulmonary vascular congestion without frank interstitial edema.  Layering moderate right pleural effusion. Small left pleural effusion.  Associated bilateral lower lobe opacities, likely atelectasis.  Endotracheal  tube terminates 5.5 cm above the carina. Additional support apparatus as above.   Electronically Signed   By: Julian Hy M.D.   On: 08/24/2014 08:17   Dg Chest Port 1 View  08/23/2014   CLINICAL DATA:  Respiratory difficulty  EXAM: PORTABLE CHEST - 1 VIEW  COMPARISON:  08/21/2014  FINDINGS: Tubular devices are stable. Normal heart size. Bilateral pleural effusions are stable. Central airspace disease is stable. No pneumothorax.  IMPRESSION: Stable bilateral pleural effusions and bilateral central airspace disease.   Electronically Signed   By: Marybelle Killings M.D.   On: 08/23/2014 07:44   Dg Chest Port 1 View  08/21/2014   CLINICAL DATA:  Hypoxia  EXAM: PORTABLE CHEST - 1 VIEW  COMPARISON:  August 20, 2014  FINDINGS: Endotracheal tube tip is 2.9 cm above the carina. Left jugular catheter tip is in the superior vena cava just beyond the junction with the left innominate vein. Dual-lumen catheter tip is in the right atrium. Nasogastric tube tip and side port are below the diaphragm. No pneumothorax. There is cardiomegaly with bilateral effusions and alveolar edema bilaterally. No  adenopathy.  IMPRESSION: Congestive heart failure. Tube and catheter positions as described without pneumothorax.   Electronically Signed   By: Lowella Grip III M.D.   On: 08/21/2014 18:41   Dg Abd Portable 1v  08/25/2014   CLINICAL DATA:  Ileus.  EXAM: PORTABLE ABDOMEN - 1 VIEW  COMPARISON:  08/21/2014  FINDINGS: NG tube remains in the stomach. There is a nonobstructive bowel gas pattern. No gaseous distention to suggest ileus. No free air or visible organomegaly or suspicious calcification. No acute bony abnormality.  IMPRESSION: No evidence of obstruction or ileus.   Electronically Signed   By: Rolm Baptise M.D.   On: 08/25/2014 12:43   Dg Abd Portable 1v  08/21/2014   CLINICAL DATA:  OG tube placement  EXAM: PORTABLE ABDOMEN - 1 VIEW  COMPARISON:  None.  FINDINGS: NG tube with tip in the gastric body. Paucity of gas in the abdomen. Bilateral pleural effusions.  IMPRESSION: NG tube with tip in the stomach.   Electronically Signed   By: Suzy Bouchard M.D.   On: 08/21/2014 18:42   Dg Fluoro Guide Cv Line-no Report  08/21/2014   CLINICAL DATA:    FLOURO GUIDE CV LINE  Fluoroscopy was utilized by the requesting physician.  No radiographic  interpretation.     Microbiology: No results found for this or any previous visit (from the past 240 hour(s)).   Labs: Basic Metabolic Panel:  Recent Labs Lab 09/03/14 0505 09/04/14 0425 09/04/14 1258 09/05/14 0311 09/06/14 0518 09/07/14 0551  NA 137 138  --  138 135 134*  K 3.8 4.1  --  4.3 3.6 3.9  CL 101 102  --  101 100* 100*  CO2 24 28  --  25 27 26   GLUCOSE 72 107*  --  104* 187* 200*  BUN 45* 24*  --  32* 25* 40*  CREATININE 5.03* 3.64* 4.17* 4.81* 4.22* 5.34*  CALCIUM 7.9* 7.6*  --  7.6* 7.6* 7.9*  MG 2.0 2.0  --  1.9 1.7 1.7  PHOS 3.6 2.4*  --  2.7 2.1* 2.5   Liver Function Tests:  Recent Labs Lab 09/03/14 0505 09/04/14 0425 09/05/14 0311 09/06/14 0518 09/07/14 0551  ALBUMIN 2.0* 2.0* 2.0* 2.0* 2.1*   No results for  input(s): LIPASE, AMYLASE in the last 168 hours. No results for input(s): AMMONIA in the last 168  hours. CBC:  Recent Labs Lab 09/03/14 0505 09/04/14 0425 09/04/14 1258 09/05/14 0311 09/06/14 0518 09/07/14 0551  WBC 9.2 10.4 8.0 10.0 10.3 8.1  NEUTROABS 7.0 8.5*  --  7.9* 8.0* 6.1  HGB 9.5* 9.4* 9.2* 8.8* 8.6* 8.6*  HCT 28.9* 29.3* 28.5* 27.7* 26.4* 27.4*  MCV 83.3 85.4 84.3 86.0 85.4 84.8  PLT 291 265 245 249 226 235   Cardiac Enzymes: No results for input(s): CKTOTAL, CKMB, CKMBINDEX, TROPONINI in the last 168 hours. BNP: BNP (last 3 results)  Recent Labs  08/20/14 1350  BNP >4500.0*    ProBNP (last 3 results)  Recent Labs  01/14/14 1800  PROBNP 54690.0*    CBG:  Recent Labs Lab 09/06/14 0735 09/06/14 1155 09/06/14 1635 09/06/14 2126 09/07/14 0753  GLUCAP 137* 97 233* 257* 248*       Signed:  Delmas Faucett  [facility] 09/07/2014, 10:23 AM

## 2014-09-07 NOTE — Care Management Note (Addendum)
Case Management Note  Patient Details  Name: Kyle [name] MRN: [id number] Date of Birth: August 28, 1955  Subjective/Objective:                  Cardiomyopathy, CAD  Action/Plan: Cm spoke to patient and family at the bedside. Patient just returned to room from HD. CM spoke to pt about DME and that PT reccommended [facility] and pt declines to get another as he states that he already has one at home along with a raised toilet seat. Pt denies the need for additional DME. CM offered choice of [place] agency for [name] needs and pt chose [facility]. CM called Tiffany with [facility] and advised of HH PT, OT, and RN. CM asked about 24 hours supervision and pt states that his wife will be home with him. No further CM needs communicated.    Expected Discharge Date:  09/07/14             Expected Discharge Plan:  [place]  In-House Referral:     Discharge planning Services  CM Consult  Post Acute Care Choice:  Home Health Choice offered to:  Patient  DME Arranged:    DME Agency:     HH Arranged:  PT, OT, RN [place] Agency:  [facility]  Status of Service:  Completed, signed off  Medicare Important Message Given:    Date Medicare IM Given:    Medicare IM give by:    Date Additional Medicare IM Given:    Additional Medicare Important Message give by:     If discussed at [place] of Stay Meetings, dates discussed:    Additional Comments:  Guido Sander, RN 09/07/2014, 3:12 PM

## 2014-09-09 ENCOUNTER — Other Ambulatory Visit: Payer: Self-pay

## 2014-09-10 ENCOUNTER — Ambulatory Visit: Payer: 59 | Admitting: Internal Medicine

## 2014-09-10 MED ORDER — CHLORHEXIDINE GLUCONATE CLOTH 2 % EX PADS: 6.0000 | MEDICATED_PAD | Freq: Once | CUTANEOUS | Status: DC

## 2014-09-10 MED ORDER — DEXTROSE 5 % IV SOLN
1.5000 g | INTRAVENOUS | Status: AC
  Administered 2014-09-11: 1.5 g via INTRAVENOUS
  Filled 2014-09-10: qty 1.5

## 2014-09-10 MED ORDER — SODIUM CHLORIDE 0.9 % IV SOLN
INTRAVENOUS | Status: DC
  Administered 2014-09-11 (×3): via INTRAVENOUS

## 2014-09-10 NOTE — Progress Notes (Signed)
.  I was unable to reach patient by phone.  I left  A message on voice mail.  I instructed the patient to arrive at [facility] entrance at 11:20am  , nothing to eat or drink after midnight.   I instructed the patient to take the following medications in the am with just enough water to get them down:Aspirin, Carvedilol, Hydralazine  I asked patient to not wear any lotions, powders, cologne, jewelry, piercing, make-up or nail polish.  I asked the patient to call [phone], in the am if there were any questions or problems.

## 2014-09-10 NOTE — Progress Notes (Signed)
Anesthesia Chart Review: SAME DAY WORK-UP.  Patient is a 59 year old male scheduled for LUE AVF versus AVGG tomorrow by Dr. Oneida Alar. Our PAT RN has not yet been able to reach patient for his phone interview. He was a no show for his office visit with Dr. Doreene Burke this morning at [facility] and [facility]. He is scheduled to start his first out-patient hemodialysis treatment at [facility] this afternoon.  Patient was admitted to [facility] from 08/20/14 to 09/07/14.  - 08/20/14 admitted with acute on chronic combined systolic and diastolic CHF (BNP > 123XX123; EF 15% 12/2013), CKD stage V (Cr 10.77, up from 5.63), and elevated troponin (0.05). (Had failed to follow-up with nephrology or cardiology following his 123XX123 acute systolic CHF admission.) Was seen by [facility]. LHC/RHC planned after HD initiated.  - 08/21/14 underwent right IJ HD dialysis catheter insertion. Procedure was started as MAC but he then developed significant bradycardia (59 to 29 bpm), hypotension and given atropine and epinephrine, CPR for PEA arrest initiated. Patient was intubated, femoral line inserted. CPR was administered for approximately 12 minutes with ROSC. He was transferred to ICU for PCCM and cardiology evaluations. [employer] protocol initiated. EF 10% on 08/22/14 echo was down to 10% with question of cardiac amyloidosis. - 09/02/14 EP cardiology consult by Dr. Caryl Comes. He is not a candidate for CRT due to narrow QRS complex. Possible candidate for ICD; however, would first need to exclude ischemic heart disease and amyloid. "The presence of amyloid would inform therapeutic options as well as limitations. Hence, I think medical therapy and elucidation as to the potential presence of amyloid would make sense. African-American males or high risk for transthyretin amyloid and this can be assessed by genetic testing. Fat-pad biopsies and other toolsmight also be appropriate".  - 09/04/14 Cath showed 1V CAD in non-dominant  RCA. Medical therapy recommended.  - 09/06/14 U/S guided core needle biopsy of anterior abdominal wall fat pad. Requested by cardiology to evaluate for possible amyloid. (I don't see path results as of yet.)  - 09/07/14 Discharge home with plans for out-patient hemodialysis at [facility]. Nephrology wanted permanent hemodialysis access sooner than later. Seen by cardiologist Dr. Domenic Polite prior to discharge with note stating, "Ok to proceed with permanent fistula placement."  Other history includes former smoker, HTN, DM2, medical non-compliance.   09/04/14 LHC/RHC:   Ost LAD to Mid LAD lesion, 25% stenosed.  1st Mrg lesion, 20% stenosed.  Prox Cx to Mid Cx lesion, 20% stenosed.  Prox RCA lesion, 80% stenosed. 1. Single vessel obstructive CAD involving a nondominant RCA 2. Moderate pulmonary HTN with normal LV filling pressures.  Recommendations: medical management.   08/22/14 Echo: Study Conclusions - Left ventricle: Myocardium with speckled appearance, consider cardiac amyloidosis (versus ESRD). Smoke noted in LV. The cavity size was normal. Wall thickness was increased in a pattern of severe LVH. The estimated ejection fraction was 10%. Diffuse hypokinesis. Doppler parameters are consistent with abnormal left ventricular relaxation (grade 1 diastolic dysfunction). - Aortic valve: There was no stenosis. There was mild regurgitation. - Mitral valve: There was no significant regurgitation. - Left atrium: The atrium was mildly dilated. - Right ventricle: The cavity size was mildly dilated. Systolic function was severely reduced. - Right atrium: The atrium was mildly dilated. - Atrial septum: There appeared to be left to right flow across the interatrial septum, ?PFO but not fully visualized. - Pulmonary arteries: No complete TR doppler jet so unable to estimate PA systolic pressure. -  Systemic veins: IVC measured 1.6 cm with < 50% respirophasic variation, suggesting RA pressure 8  mmHg. - Pericardium, extracardiac: Large left pleural effusion. A small circumferential pericardial effusion was identified. Impressions: Normal LV size with severe LV hypertrophy. Speckled myocardium suggests possibility of cardiac amyloidosis (versus ESRD). EF 10% with diffuse hypokinesis, smoke noted in the LV. Mildly dilated RV with severe systolic dysfunction. Large left pleural effusion,small pericardial effusion. (EF on 01/14/14 echo was 15%.)  08/22/14 EKG: SR versus low atrial rhythm, LAD, non-specific T wave abnormality.   08/26/14 1V CXR: IMPRESSION: Slight interval improvement in the appearance of the will pulmonary interstitium and central pulmonary vascularity may reflect improving pulmonary edema. Bilateral pleural effusions and bibasilar atelectasis or infiltrate persists. The support tubes are in reasonable position.  He is for labs on arrival.   I updated anesthesiologist Dr. Orene Desanctis regarding above. Further evaluation on the day of surgery to ensure no acute changes and that labs are acceptable. Case is posted for MAC.  George Hugh [facility] Short Stay Center/Anesthesiology Phone [phone] 09/10/2014 1:09 PM

## 2014-09-11 ENCOUNTER — Ambulatory Visit (HOSPITAL_COMMUNITY): Payer: 59 | Admitting: Vascular Surgery

## 2014-09-11 ENCOUNTER — Encounter (HOSPITAL_COMMUNITY)
Admission: RE | Disposition: A | Discharge status: Home / Self Care | Payer: Self-pay | Source: Ambulatory Visit | Attending: Vascular Surgery

## 2014-09-11 ENCOUNTER — Encounter (HOSPITAL_COMMUNITY): Payer: Self-pay | Admitting: *Deleted

## 2014-09-11 ENCOUNTER — Ambulatory Visit (HOSPITAL_COMMUNITY)
Admission: RE | Admit: 2014-09-11 | Discharge: 2014-09-11 | Disposition: A | Discharge status: Home / Self Care | Payer: 59 | Source: Ambulatory Visit | Attending: Vascular Surgery | Admitting: Vascular Surgery

## 2014-09-11 DIAGNOSIS — I2510 Atherosclerotic heart disease of native coronary artery without angina pectoris: Secondary | ICD-10-CM | POA: Insufficient documentation

## 2014-09-11 DIAGNOSIS — Z87891 Personal history of nicotine dependence: Secondary | ICD-10-CM | POA: Insufficient documentation

## 2014-09-11 DIAGNOSIS — Z9119 Patient's noncompliance with other medical treatment and regimen: Secondary | ICD-10-CM | POA: Insufficient documentation

## 2014-09-11 DIAGNOSIS — Z992 Dependence on renal dialysis: Secondary | ICD-10-CM | POA: Diagnosis not present

## 2014-09-11 DIAGNOSIS — N186 End stage renal disease: Secondary | ICD-10-CM | POA: Diagnosis present

## 2014-09-11 DIAGNOSIS — I120 Hypertensive chronic kidney disease with stage 5 chronic kidney disease or end stage renal disease: Secondary | ICD-10-CM | POA: Diagnosis not present

## 2014-09-11 DIAGNOSIS — N184 Chronic kidney disease, stage 4 (severe): Secondary | ICD-10-CM | POA: Diagnosis not present

## 2014-09-11 DIAGNOSIS — E1122 Type 2 diabetes mellitus with diabetic chronic kidney disease: Secondary | ICD-10-CM | POA: Insufficient documentation

## 2014-09-11 DIAGNOSIS — Z8674 Personal history of sudden cardiac arrest: Secondary | ICD-10-CM | POA: Diagnosis not present

## 2014-09-11 DIAGNOSIS — I5042 Chronic combined systolic (congestive) and diastolic (congestive) heart failure: Secondary | ICD-10-CM | POA: Diagnosis not present

## 2014-09-11 HISTORY — DX: Cardiac arrest, cause unspecified: I46.9

## 2014-09-11 HISTORY — PX: AV FISTULA PLACEMENT: SHX1204

## 2014-09-11 LAB — POCT I-STAT 4, (NA,K, GLUC, HGB,HCT)
GLUCOSE: 104 mg/dL — AB (ref 65–99)
HCT: 29 % — ABNORMAL LOW (ref 39.0–52.0)
HEMOGLOBIN: 9.9 g/dL — AB (ref 13.0–17.0)
Potassium: 4.4 mmol/L (ref 3.5–5.1)
Sodium: 135 mmol/L (ref 135–145)

## 2014-09-11 LAB — GLUCOSE, CAPILLARY
GLUCOSE-CAPILLARY: 77 mg/dL (ref 65–99)
GLUCOSE-CAPILLARY: 64 mg/dL — AB (ref 65–99)
GLUCOSE-CAPILLARY: 72 mg/dL (ref 65–99)

## 2014-09-11 SURGERY — ARTERIOVENOUS (AV) FISTULA CREATION
Anesthesia: Monitor Anesthesia Care | Site: Arm Lower | Laterality: Left

## 2014-09-11 MED ORDER — MIDAZOLAM HCL 2 MG/2ML IJ SOLN
INTRAMUSCULAR | Status: AC
  Filled 2014-09-11: qty 4

## 2014-09-11 MED ORDER — LIDOCAINE HCL (PF) 1 % IJ SOLN
INTRAMUSCULAR | Status: DC | PRN
  Administered 2014-09-11: 28 mL

## 2014-09-11 MED ORDER — FENTANYL CITRATE (PF) 100 MCG/2ML IJ SOLN
INTRAMUSCULAR | Status: DC | PRN
  Administered 2014-09-11: 50 ug via INTRAVENOUS

## 2014-09-11 MED ORDER — 0.9 % SODIUM CHLORIDE (POUR BTL) OPTIME
TOPICAL | Status: DC | PRN
  Administered 2014-09-11: 1000 mL

## 2014-09-11 MED ORDER — DEXMEDETOMIDINE HCL IN NACL 200 MCG/50ML IV SOLN
INTRAVENOUS | Status: DC | PRN
  Administered 2014-09-11: 0.4 ug/kg/h via INTRAVENOUS

## 2014-09-11 MED ORDER — PROPOFOL 10 MG/ML IV BOLUS
INTRAVENOUS | Status: AC
  Filled 2014-09-11: qty 20

## 2014-09-11 MED ORDER — FENTANYL CITRATE (PF) 250 MCG/5ML IJ SOLN
INTRAMUSCULAR | Status: AC
  Filled 2014-09-11: qty 5

## 2014-09-11 MED ORDER — LIDOCAINE HCL (PF) 1 % IJ SOLN
INTRAMUSCULAR | Status: AC
  Filled 2014-09-11: qty 30

## 2014-09-11 MED ORDER — MIDAZOLAM HCL 5 MG/5ML IJ SOLN
INTRAMUSCULAR | Status: DC | PRN
  Administered 2014-09-11: 1 mg via INTRAVENOUS

## 2014-09-11 MED ORDER — SODIUM CHLORIDE 0.9 % IR SOLN
Status: DC | PRN
  Administered 2014-09-11: 15:00:00

## 2014-09-11 MED ORDER — THROMBIN 20000 UNITS EX SOLR
CUTANEOUS | Status: AC
  Filled 2014-09-11: qty 20000

## 2014-09-11 MED ORDER — HEPARIN SODIUM (PORCINE) 1000 UNIT/ML IJ SOLN
INTRAMUSCULAR | Status: DC | PRN
  Administered 2014-09-11: 5000 [IU] via INTRAVENOUS

## 2014-09-11 MED ORDER — LIDOCAINE HCL (CARDIAC) 20 MG/ML IV SOLN
INTRAVENOUS | Status: AC
  Filled 2014-09-11: qty 5

## 2014-09-11 MED ORDER — OXYCODONE HCL 5 MG PO TABS: 5.0000 mg | ORAL_TABLET | Freq: Four times a day (QID) | ORAL | Status: DC | PRN

## 2014-09-11 MED ORDER — HEPARIN SODIUM (PORCINE) 1000 UNIT/ML IJ SOLN
INTRAMUSCULAR | Status: AC
  Filled 2014-09-11: qty 1

## 2014-09-11 MED ORDER — ONDANSETRON HCL 4 MG/2ML IJ SOLN
INTRAMUSCULAR | Status: AC
  Filled 2014-09-11: qty 2

## 2014-09-11 SURGICAL SUPPLY — 37 items
ARMBAND PINK RESTRICT EXTREMIT (MISCELLANEOUS) ×2 IMPLANT
CANISTER SUCTION 2500CC (MISCELLANEOUS) ×2 IMPLANT
CANNULA VESSEL 3MM 2 BLNT TIP (CANNULA) ×2 IMPLANT
CLIP TI MEDIUM 6 (CLIP) ×2 IMPLANT
CLIP TI WIDE RED SMALL 6 (CLIP) ×2 IMPLANT
COVER PROBE W GEL 5X96 (DRAPES) ×1 IMPLANT
DECANTER SPIKE VIAL GLASS SM (MISCELLANEOUS) ×2 IMPLANT
DRAIN PENROSE 1/4X12 LTX STRL (WOUND CARE) ×1 IMPLANT
ELECT REM PT RETURN 9FT ADLT (ELECTROSURGICAL) ×2
ELECTRODE REM PT RTRN 9FT ADLT (ELECTROSURGICAL) ×1 IMPLANT
GLOVE BIO SURGEON STRL SZ7.5 (GLOVE) ×2 IMPLANT
GLOVE BIOGEL M 6.5 STRL (GLOVE) ×3 IMPLANT
GLOVE BIOGEL PI IND STRL 7.0 (GLOVE) IMPLANT
GLOVE BIOGEL PI IND STRL 7.5 (GLOVE) IMPLANT
GLOVE BIOGEL PI INDICATOR 7.0 (GLOVE) ×3
GLOVE BIOGEL PI INDICATOR 7.5 (GLOVE) ×2
GLOVE ECLIPSE 7.0 STRL STRAW (GLOVE) ×1 IMPLANT
GOWN STRL REUS W/ TWL LRG LVL3 (GOWN DISPOSABLE) ×3 IMPLANT
GOWN STRL REUS W/TWL LRG LVL3 (GOWN DISPOSABLE) ×8
GRAFT GORETEX STRT 6X50 (Vascular Products) ×1 IMPLANT
KIT BASIN OR (CUSTOM PROCEDURE TRAY) ×2 IMPLANT
KIT ROOM TURNOVER OR (KITS) ×2 IMPLANT
LIQUID BAND (GAUZE/BANDAGES/DRESSINGS) ×2 IMPLANT
LOOP VESSEL MINI RED (MISCELLANEOUS) IMPLANT
NS IRRIG 1000ML POUR BTL (IV SOLUTION) ×2 IMPLANT
PACK CV ACCESS (CUSTOM PROCEDURE TRAY) ×2 IMPLANT
PAD ARMBOARD 7.5X6 YLW CONV (MISCELLANEOUS) ×4 IMPLANT
SPONGE SURGIFOAM ABS GEL 100 (HEMOSTASIS) IMPLANT
SUT PROLENE 6 0 BV (SUTURE) ×2 IMPLANT
SUT PROLENE 6 0 CC (SUTURE) ×2 IMPLANT
SUT SILK 3 0 (SUTURE) ×2
SUT SILK 3-0 18XBRD TIE 12 (SUTURE) IMPLANT
SUT VIC AB 3-0 SH 27 (SUTURE) ×4
SUT VIC AB 3-0 SH 27X BRD (SUTURE) ×1 IMPLANT
SUT VICRYL 4-0 PS2 18IN ABS (SUTURE) ×2 IMPLANT
UNDERPAD 30X30 INCONTINENT (UNDERPADS AND DIAPERS) ×2 IMPLANT
WATER STERILE IRR 1000ML POUR (IV SOLUTION) ×2 IMPLANT

## 2014-09-11 NOTE — Progress Notes (Signed)
Hypoglycemic Event  CBG: 64   Treatment: 15 GM carbohydrate snack  Symptoms: None  Follow-up CBG: Time:1700.  CBG Result:72   Possible Reasons for Event: Inadequate meal intake  Comments/MD notified:    Pilar Plate  Remember to initiate Hypoglycemia Order Set & complete

## 2014-09-11 NOTE — H&P (Signed)
[facility] SHORT STAY H&P  CC:  Need HD access  HPI: 59 y/o male on HD via right side catheter.  Here today for dialysis access.  He is right handed.  Vein map shows poor quality vein for fistula.  Past Medical History  Diagnosis Date  . Hypertension   . Diabetes mellitus without complication   . Cardiac arrest     07/2014-Arctic Sun initiated    FH:  Non-Contributory  Social HX Social History  Substance Use Topics  . Smoking status: Former Smoker    Types: Cigarettes  . Smokeless tobacco: Never Used  . Alcohol Use: No    Allergies No Known Allergies  Medications Current Facility-Administered Medications  Medication Dose Route Frequency Provider Last Rate Last Dose  . 0.9 %  sodium chloride infusion   Intravenous Continuous Angelia Mould, MD 10 mL/hr at 09/11/14 1217    . cefUROXime (ZINACEF) 1.5 g in dextrose 5 % 50 mL IVPB  1.5 g Intravenous To SS-Surg Angelia Mould, MD      . Chlorhexidine Gluconate Cloth 2 % PADS 6 each  6 each Topical Once Angelia Mould, MD         PHYSICAL EXAM  Filed Vitals:   09/11/14 1158  BP: 137/74  Pulse: 75  Temp: 98.3 F (36.8 C)  Resp: 18    General:  WDWN in NAD HENT: WNL Eyes: Pupils equal Pulmonary: normal non-labored breathing , without Rales, rhonchi,  wheezing Cardiac: RRR, Vascular Exam/Pulses: 2+ brachial and radial pulses  Extremities without ischemic changes, no Gangrene , no cellulitis; no open wounds;  Neuro A&O x 3; good sensation; motion in all extremities  Impression: Needs HD access  Plan: Left arm AV graft today.  Risk benefit complications including but not limited to bleeding graft infection thrombosis nerve dysfunction steal discussed with pt who wishes to proceed.   Ruta Hinds @TODAY @ 2:47 PM

## 2014-09-11 NOTE — Discharge Instructions (Signed)
° ° °  09/11/2014 Kyle [name] [id number] [birth year]  Surgeon(s): Elam Dutch, MD  Procedure(s): INSERTION OF LEFT ARM 6MM ARTERIOVENOUS GORTEX GRAFT   x Do not stick graft for 4 weeks

## 2014-09-11 NOTE — Anesthesia Postprocedure Evaluation (Signed)
  Anesthesia Post-op Note  Patient: Kyle [name]  Procedure(s) Performed: Procedure(s) (LRB): INSERTION OF LEFT ARM 6MM ARTERIOVENOUS GORTEX GRAFT  (Left)  Patient Location: PACU  Anesthesia Type: MAC  Level of Consciousness: awake and alert   Airway and Oxygen Therapy: Patient Spontanous Breathing  Post-op Pain: mild  Post-op Assessment: Post-op Vital signs reviewed, Patient's Cardiovascular Status Stable, Respiratory Function Stable, Patent Airway and No signs of Nausea or vomiting  Last Vitals:  Filed Vitals:   09/11/14 1736  BP:   Pulse: 74  Temp: 36.5 C  Resp: 12    Post-op Vital Signs: stable   Complications: No apparent anesthesia complications

## 2014-09-11 NOTE — Transfer of Care (Signed)
Immediate Anesthesia Transfer of Care Note  Patient: Kyle [name]  Procedure(s) Performed: Procedure(s): INSERTION OF LEFT ARM 6MM ARTERIOVENOUS GORTEX GRAFT  (Left)  Patient Location: PACU  Anesthesia Type:MAC  Level of Consciousness: awake, alert , oriented and patient cooperative  Airway & Oxygen Therapy: Patient Spontanous Breathing and Patient connected to nasal cannula oxygen  Post-op Assessment: Report given to RN, Post -op Vital signs reviewed and stable and Patient moving all extremities  Post vital signs: Reviewed and stable  Last Vitals:  Filed Vitals:   09/11/14 1158  BP: 137/74  Pulse: 75  Temp: 36.8 C  Resp: 18    Complications: No apparent anesthesia complications

## 2014-09-11 NOTE — Transfer of Care (Signed)
Immediate Anesthesia Transfer of Care Note  Patient: Kyle [name]  Procedure(s) Performed: Procedure(s): INSERTION OF LEFT ARM 6MM ARTERIOVENOUS GORTEX GRAFT  (Left)  Patient Location: PACU  Anesthesia Type:MAC  Level of Consciousness: awake, alert , oriented and patient cooperative  Airway & Oxygen Therapy: Patient Spontanous Breathing and Patient connected to nasal cannula oxygen  Post-op Assessment: Report given to RN, Post -op Vital signs reviewed and stable, Patient moving all extremities and Patient moving all extremities X 4  Post vital signs: Reviewed and stable  Last Vitals:  Filed Vitals:   09/11/14 1158  BP: 137/74  Pulse: 75  Temp: 36.8 C  Resp: 18    Complications: No apparent anesthesia complications

## 2014-09-11 NOTE — Anesthesia Preprocedure Evaluation (Addendum)
Anesthesia Evaluation  Patient identified by MRN, date of birth, ID band Patient awake    Reviewed: Allergy & Precautions, NPO status , Patient's Chart, lab work & pertinent test results  History of Anesthesia Complications Negative for: history of anesthetic complications  Airway Mallampati: I  TM Distance: >3 FB Neck ROM: Full    Dental  (+) Teeth Intact, Dental Advisory Given   Pulmonary former smoker,  breath sounds clear to auscultation        Cardiovascular hypertension, + CAD, +CHF and + DOE Rhythm:Regular Rate:Normal  EF at 15%   Neuro/Psych    GI/Hepatic   Endo/Other  diabetes, Poorly Controlled  Renal/GU CRF and Renal InsufficiencyRenal disease     Musculoskeletal   Abdominal   Peds  Hematology  (+) anemia ,   Anesthesia Other Findings Patient overall looks much better after some regular dialysis and consistent medication for heart failure/HTN.Marland Kitchen He returns for AV fistula creation  7/16 EF of <20%, acutely ill and hadnt started dialysis,, Cath with single vessel disease of non-dominant RCA  Denies angina or cardiac symptoms,   Reproductive/Obstetrics                            Anesthesia Physical  Anesthesia Plan  ASA: III  Anesthesia Plan: MAC   Post-op Pain Management:    Induction: Intravenous  Airway Management Planned: Natural Airway and Simple Face Mask  Additional Equipment:   Intra-op Plan:   Post-operative Plan:   Informed Consent: I have reviewed the patients History and Physical, chart, labs and discussed the procedure including the risks, benefits and alternatives for the proposed anesthesia with the patient or authorized representative who has indicated his/her understanding and acceptance.   Dental advisory given  Plan Discussed with: CRNA and Surgeon  Anesthesia Plan Comments:         Anesthesia Quick Evaluation

## 2014-09-11 NOTE — Op Note (Signed)
Procedure: Left Forearm AV graft  Preop: ESRD  Postop: ESRD  Anesthesia: Local with IV sedation  Findings:6 mm PTFE end to side to deep brachial vein  Asst. Gerri Lins, PA-c, Leontine Locket PA-C   Procedure Details: The left upper extremity was prepped and draped in usual sterile fashion.  A longitudinal incision was then made near the antecubital crease the left arm.  There were no suitable antecubital veins for outflow.   The incision was carried into the subcutaneous tissues down to level of the brachial artery.  Next the brachial artery was dissected free in the medial portion incision. The artery was  3-4 mm in diameter. The vessel loops were placed proximal and distal to the planned site of arteriotomy. The adjacent deep brachial vein was 3 mm in diameter and of good quality.  Next, a subcutaneous tunnel was created in a loop configuration in the forearm.  A 6 mm PTFE graft was then brought through this subcutaneous tunnel. The patient was given 5000 units of intravenous heparin. After appropriate circulation time, the vessel loops were used to control the artery. A longitudinal opening was made in the right brachial artery.  The graft end limb on the ulnar side of the forearm was beveled and sewn end to side to the artery using a 6 0 prolene.  At completion of the anastomosis the artery was forward bled, backbled and thoroughly flushed.  The anastomosis was secured, vessel loops were released and there was palpable pulse in the graft.  The graft was clamped just above the arterial anastomosis with a fistula clamp. The graft was then pulled taut to length.  The vein was controlled with a fine bulldog clamp.  A longitudinal opening was made in the vein.  The distal end of the graft was then beveled and sewn end to side to the vein using a running 6 0 prolene.  Just prior to completion of the anastomosis, everything was forward bled, back bled and thoroughly flushed.  The anastomosis was  secured and the fistula clamp removed from the proximal graft.  A thrill was immediately palpable in the graft.   After hemostasis was obtained, the subcutaneous tissues were reapproximated using a running 3-0 Vicryl suture. The skin was then closed with a 4 0 Vicryl subcuticular stitch. Dermabond was applied to the skin incisions.  The patient tolerated the procedure well and there were no complications.  Instrument sponge and needle count was correct at the end of the case.  The patient was taken to the recovery room in stable condition. The patient had a palpable radial pulse at the end of the case.  Ruta Hinds, MD [facility] Office: [phone] Pager: [phone]

## 2014-09-12 ENCOUNTER — Encounter (HOSPITAL_COMMUNITY): Payer: Self-pay | Admitting: Vascular Surgery

## 2014-09-16 ENCOUNTER — Encounter: Payer: Self-pay | Admitting: Cardiovascular Disease

## 2014-10-09 ENCOUNTER — Other Ambulatory Visit: Payer: Self-pay | Admitting: Cardiovascular Disease

## 2014-10-09 ENCOUNTER — Telehealth: Payer: Self-pay | Admitting: *Deleted

## 2014-10-09 MED ORDER — LISINOPRIL 10 MG PO TABS: 10.0000 mg | ORAL_TABLET | Freq: Every day | ORAL | Status: DC

## 2014-10-09 MED ORDER — HYDRALAZINE HCL 25 MG PO TABS: 50.0000 mg | ORAL_TABLET | Freq: Three times a day (TID) | ORAL | Status: DC

## 2014-10-09 NOTE — Telephone Encounter (Signed)
Refilled 1 time only with note to keep ov

## 2014-10-09 NOTE — Telephone Encounter (Signed)
Patient called for refills on lisinopril and hydralazine. He was started on these in the hospital and per discharge note he was to follow up with Dr Mare Ferrari in two weeks. He has rescheduled his appointment until next month. Ok to refill these until then? Please advise. Thanks, MI

## 2014-10-10 ENCOUNTER — Ambulatory Visit: Payer: 59 | Admitting: Physician Assistant

## 2014-10-21 NOTE — Telephone Encounter (Signed)
Agree 

## 2014-10-30 ENCOUNTER — Other Ambulatory Visit: Payer: Self-pay | Admitting: Cardiology

## 2014-11-05 NOTE — Progress Notes (Signed)
Cardiology Office Note   Date:  11/05/2014   ID:  JAHRELL SALASAR, DOB 1955/11/29, MRN [id number]  PCP:  [name], MD  Cardiologist:  Dr. Jenkins Rouge   Electrophysiologist:  n/a  Chief Complaint  Patient presents with  . Hospitalization Follow-up  . Cardiomyopathy  . Congestive Heart Failure     History of Present Illness: Kyle [name] is a 59 y.o. male with a hx of    Studies/Reports Reviewed Today:  LHC 09/04/14  Ost LAD to Mid LAD lesion, 25% stenosed.  1st Mrg lesion, 20% stenosed.  Prox Cx to Mid Cx lesion, 20% stenosed.  Prox RCA lesion, 80% stenosed.  1. Single vessel obstructive CAD involving a nondominant RCA 2. Moderate pulmonary HTN with normal LV filling pressures.   Recommendations: medical management.      Indications    Nonischemic cardiomyopathy (HCC) [I42.9 (ICD-10-CM)]    Technique and Indications    Indication: 59 yo BM with newly diagnosed cardiomyopathy with EF 10% by Echo. S/p PEA arrest.   Procedural Details: The right groin was prepped, draped, and anesthetized with 1% lidocaine. Using the modified Seldinger technique a 5 Fr sheath was placed in the right femoral artery and a 7 French sheath was placed in the right femoral vein. A Swan-Ganz catheter was used for the right heart catheterization. Standard protocol was followed for recording of right heart pressures and sampling of oxygen saturations. Fick cardiac output was calculated. Standard Judkins catheters were used for selective coronary angiography and left ventricular pressures. There were no immediate procedural complications. The patient was transferred to the post catheterization recovery area for further monitoring. EBL minimal.There were no immediate complications during the procedure.    Coronary Findings    Dominance: Left   Left Main  The vessel is normal in caliber is angiographically normal.     Left Anterior Descending   . Ost LAD to Mid LAD  lesion, 25% stenosed. diffuse .     Ramus Intermedius  The vessel is small .     Left Circumflex   . Prox Cx to Mid Cx lesion, 20% stenosed.   . First Obtuse Marginal Branch   The vessel is large in size.   . 1st Mrg lesion, 20% stenosed.     Right Coronary Artery   . Prox RCA lesion, 80% stenosed. discrete .       Right Heart Pressures Hemodynamic findings consistent with moderate pulmonary hypertension. LV EDP is normal.    Coronary Diagrams    Diagnostic Diagram               Echo 08/22/14 - Left ventricle: Myocardium with speckled appearance, consider cardiac amyloidosis (versus ESRD). Smoke noted in LV. The cavity size was normal. Wall thickness was increased in a pattern of severe LVH. The estimated ejection fraction was 10%. Diffuse hypokinesis. Doppler parameters are consistent with abnormal left ventricular relaxation (grade 1 diastolic dysfunction). - Aortic valve: There was no stenosis. There was mild regurgitation. - Mitral valve: There was no significant regurgitation. - Left atrium: The atrium was mildly dilated. - Right ventricle: The cavity size was mildly dilated. Systolic function was severely reduced. - Right atrium: The atrium was mildly dilated. - Atrial septum: There appeared to be left to right flow across the interatrial septum, ?PFO but not fully visualized. - Pulmonary arteries: No complete TR doppler jet so unable to estimate PA systolic pressure. - Systemic veins: IVC measured 1.6 cm with < 50% respirophasic  variation, suggesting RA pressure 8 mmHg. - Pericardium, extracardiac: Large left pleural effusion. A small circumferential pericardial effusion was identified. Impressions:  - Normal LV size with severe LV hypertrophy. Speckled myocardium suggests possibility of cardiac amyloidosis (versus ESRD). EF 10% with diffuse hypokinesis, smoke noted in the LV. Mildly dilated RV with severe systolic  dysfunction. Large left pleural effusion, small pericardial effusion.   Past Medical History  Diagnosis Date  . Hypertension   . Diabetes mellitus without complication   . Cardiac arrest     07/2014-Arctic Sun initiated    Past Surgical History  Procedure Laterality Date  . External ear surgery    . Tonsillectomy    . Insertion of dialysis catheter N/A 08/21/2014    Procedure: INSERTION OF DIALYSIS CATHETER RIGHT INTERNAL JUGULAR VEIN;  Surgeon: Conrad Hazen, MD;  Location: [facility];  Service: Vascular;  Laterality: N/A;  MAC to General  . Cardiac catheterization N/A 09/04/2014    Procedure: Right/Left Heart Cath and Coronary Angiography;  Surgeon: Peter M Martinique, MD;  Location: [facility];  Service: Cardiovascular;  Laterality: N/A;  . Av fistula placement Left 09/11/2014    Procedure: INSERTION OF LEFT ARM 6MM ARTERIOVENOUS GORTEX GRAFT ;  Surgeon: Elam Dutch, MD;  Location: [facility] OR;  Service: Vascular;  Laterality: Left;     Current Outpatient Prescriptions  Medication Sig Dispense Refill  . aspirin 81 MG chewable tablet Chew 1 tablet (81 mg total) by mouth daily.    . calcitRIOL (ROCALTROL) 0.25 MCG capsule Take 1 capsule (0.25 mcg total) by mouth every Monday, Wednesday, and Friday. 30 capsule 1  . carvedilol (COREG) 12.5 MG tablet Take 1 tablet (12.5 mg total) by mouth 2 (two) times daily with a meal. 60 tablet 1  . ferrous sulfate 325 (65 FE) MG tablet Take 1 tablet (325 mg total) by mouth daily with breakfast. 30 tablet 3  . glimepiride (AMARYL) 2 MG tablet Take 1 tablet (2 mg total) by mouth daily with breakfast. 30 tablet 1  . hydrALAZINE (APRESOLINE) 25 MG tablet Take 2 tablets (50 mg total) by mouth 3 (three) times daily. MUST KEEP October APPOINTMENT 180 tablet 0  . lisinopril (PRINIVIL,ZESTRIL) 10 MG tablet Take 1 tablet (10 mg total) by mouth daily. MUST KEEP October APPOINTMENT 30 tablet 0  . multivitamin (RENA-VIT) TABS tablet Take 1 tablet by mouth at  bedtime. 30 tablet 0  . oxyCODONE (ROXICODONE) 5 MG immediate release tablet Take 1 tablet (5 mg total) by mouth every 6 (six) hours as needed. 20 tablet 0   No current facility-administered medications for this visit.    Allergies:   Review of patient's allergies indicates no known allergies.    Social History:  The patient  reports that he has quit smoking. His smoking use included Cigarettes. He has never used smokeless tobacco. He reports that he does not drink alcohol or use illicit drugs.   Family History:  [name] family history includes Heart failure in his mother.    ROS:   Please see the history of present illness.   ROS    PHYSICAL EXAM: VS:  There were no vitals taken for this visit.    Wt Readings from Last 3 Encounters:  09/07/14 169 lb 12.1 oz (77 kg)  01/17/14 220 lb 3.8 oz (99.9 kg)     GEN: Well nourished, well developed, in no acute distress HEENT: normal Neck: no JVD, no carotid bruits, no masses Cardiac:  Normal S1/S2, RRR;  no murmur ,  no rubs or gallops, no edema  Respiratory:  clear to auscultation bilaterally, no wheezing, rhonchi or rales. GI: soft, nontender, nondistended, + BS MS: no deformity or atrophy Skin: warm and dry  Neuro:  CNs II-XII intact, Strength and sensation are intact Psych: Normal affect   EKG:  EKG is ordered today.  It demonstrates:      Recent Labs: 01/14/2014: Pro B Natriuretic peptide (BNP) 54690.0* 08/20/2014: B Natriuretic Peptide >4500.0*; TSH 2.329 08/26/2014: ALT 14* 09/07/2014: BUN 40*; Creatinine, Ser 5.34*; Magnesium 1.7; Platelets 235 09/11/2014: Hemoglobin 9.9*; Potassium 4.4; Sodium 135    Lipid Panel    Component Value Date/Time   CHOL 158 09/07/2014 0551   TRIG 116 09/07/2014 0551   HDL 46 09/07/2014 0551   CHOLHDL 3.4 09/07/2014 0551   VLDL 23 09/07/2014 0551   LDLCALC 89 09/07/2014 0551      ASSESSMENT AND PLAN:      Medication Changes: Current medicines are reviewed at length with  the patient today.  Concerns regarding medicines are as outlined above.  The following changes have been made:   Discontinued Medications   No medications on file   Modified Medications   No medications on file   New Prescriptions   No medications on file   Labs/ tests ordered today include:   No orders of the defined types were placed in this encounter.      Disposition:    FU with     Signed, Versie Starks, MHS 11/05/2014 10:17 PM    [facility] [place], [place],   [address] Phone: [phone]; Fax: [phone]    This encounter was created in error - please disregard.

## 2014-11-06 ENCOUNTER — Encounter: Payer: 59 | Admitting: Physician Assistant

## 2014-11-17 NOTE — Progress Notes (Signed)
Cardiology Office Note   Date:  11/18/2014   ID:  Kyle [name], Kyle [name] [birth year], MRN [id number]  PCP:  [name], MD  Cardiologist:  Dr. Jenkins Rouge >> requests FU in [facility] (will est with Dr. Kirk Ruths) Electrophysiologist:  Dr. Virl Axe saw during hospital stay 8/16   Chief Complaint  Patient presents with  . Hospitalization Follow-up  . Cardiomyopathy  . Congestive Heart Failure     History of Present Illness: Kyle [name] is a 59 y.o. male with a hx of HTN, DM, stage V CKD and chronic systolic HF with baseline EF 15%. He was originally admitted to [facility] in December 2015, when he presented with acute on chronic renal failure and acute heart failure. Echocardiogram was obtained which showed EF was 15% with grade 2 diastolic dysfunction, mild MR/AR, moderate TR, small pericardial effusion. Prior to that, patient did not have medical follow-up for 2 years and was not taking any of his medication for up to a year. Both cardiology and nephrology team was consulted during that admission. He was seen by Dr. Johnsie Cancel, despite having Cr > 5, he was responding to IV Lasix at the time. Per Dr. Johnsie Cancel, echo showed echogenic myocardiogram that could represent amyloid with small pericardial effusion and EF 15% with elevated EDP on diastolic evaluation. There was a questionable mass in the pericardial space near the RV and AV groove likely represent epicardial fat.  MRI could not be done due to advanced CKD.  He was hesitant to start dialysis and was lost to FU with Cardiology and Nephrology.    He was then admitted in 7/16 with a/c biventricular HF with anasarca and pleural effusion.  He was treated with paracentesis and CRRT.  Decision was made to pursue hemodialysis due to ESRD.  Patient developed bradycardic/PEA arrest during dialysis catheter placement requiring CPR x 10 minutes and eventual ROSC. He was intubated and treated with [employer] protocol.  He  recovered and was eventually extubated. He was started on hemodialysis.  Echocardiogram demonstrated speckled myocardium and EF 10%. He was seen by Dr. Virl Axe for EP.  ICD was not recommended at that time.  However, cardiac cath was recommended.  LHC demonstrated single vessel CAD and mod pulmonary HTN.  Med Rx was recommended.  Fat pad bx demonstrated no evidence of Amyloid.    He returns for follow-up. He now attends hemodialysis on Tuesday, Thursday, Saturday. Since discharge from the hospital, he has been doing well. He finished with physical therapy. He denies chest pain or shortness of breath. Overall, he is NYHA 2. He denies orthopnea, PND or edema. He denies syncope.   Studies/Reports Reviewed Today:  LHC 09/04/14 LM: Normal LAD: Ostial 25% LCx: Proximal 20%, OM1 20% RCA: Proximal 80% Moderate pulmonary hypertension, normal LVEDP 1. Single vessel obstructive CAD involving a nondominant RCA 2. Moderate pulmonary HTN with normal LV filling pressures.  Recommendations: medical management.     Echo 08/22/14 Speckled appearance (consider amyloid versus ESRD), severe LVH, EF 10%, diffuse HK, grade 1 diastolic dysfunction, mild AI, mild LAE, mild RVE, severely reduced RVSF, mild RAE,? PFO (not fully visualize) large left pleural effusion   Past Medical History  Diagnosis Date  . Hypertension   . Diabetes mellitus without complication ([place])   . Cardiac arrest ([place])     07/2014-Arctic Sun initiated    Past Surgical History  Procedure Laterality Date  . External ear surgery    . Tonsillectomy    .  Insertion of dialysis catheter N/A 08/21/2014    Procedure: INSERTION OF DIALYSIS CATHETER RIGHT INTERNAL JUGULAR VEIN;  Surgeon: Conrad [place], MD;  Location: [place];  Service: Vascular;  Laterality: N/A;  MAC to General  . Cardiac catheterization N/A 09/04/2014    Procedure: Right/Left Heart Cath and Coronary Angiography;  Surgeon: Peter M Martinique, MD;  Location: [place] CV [facility];   Service: Cardiovascular;  Laterality: N/A;  . Av fistula placement Left 09/11/2014    Procedure: INSERTION OF LEFT ARM 6MM ARTERIOVENOUS GORTEX GRAFT ;  Surgeon: Elam Dutch, MD;  [facility];  Service: Vascular;  Laterality: Left;     Current Outpatient Prescriptions  Medication Sig Dispense Refill  . aspirin 81 MG chewable tablet Chew 1 tablet (81 mg total) by mouth daily.    . calcitRIOL (ROCALTROL) 0.25 MCG capsule Take 1 capsule (0.25 mcg total) by mouth every Monday, Wednesday, and Friday. 30 capsule 1  . carvedilol (COREG) 12.5 MG tablet Take 1 tablet (12.5 mg total) by mouth 2 (two) times daily with a meal. 60 tablet 11  . glimepiride (AMARYL) 2 MG tablet Take 1 tablet (2 mg total) by mouth daily with breakfast. 30 tablet 1  . hydrALAZINE (APRESOLINE) 25 MG tablet Take 2 tablets (50 mg total) by mouth 3 (three) times daily. 180 tablet 11  . lisinopril (PRINIVIL,ZESTRIL) 10 MG tablet Take 1 tablet (10 mg total) by mouth daily. 30 tablet 11  . multivitamin (RENA-VIT) TABS tablet Take 1 tablet by mouth at bedtime. 30 tablet 0   No current facility-administered medications for this visit.    Allergies:   Review of patient's allergies indicates no known allergies.    Social History:  The patient  reports that he has quit smoking. His smoking use included Cigarettes. He has never used smokeless tobacco. He reports that he does not drink alcohol or use illicit drugs.   Family History:  [name] family history includes Heart failure in his mother.    ROS:   Please see the history of present illness.   Review of Systems  All other systems reviewed and are negative.     PHYSICAL EXAM: VS:  BP 160/74 mmHg  Pulse 91  Ht 6' (1.829 m)  Wt 174 lb 6.4 oz (79.107 kg)  BMI 23.65 kg/m2    Wt Readings from Last 3 Encounters:  11/18/14 174 lb 6.4 oz (79.107 kg)  09/07/14 169 lb 12.1 oz (77 kg)  01/17/14 220 lb 3.8 oz (99.9 kg)     GEN: Well nourished, well developed, in  no acute distress HEENT: normal Neck: no JVD, no masses Cardiac:  Normal S1/S2, RRR; no murmur ,  no rubs or gallops, no edema; R groin without hematoma or bruit  Respiratory:  clear to auscultation bilaterally, no wheezing, rhonchi or rales. GI: soft, nontender, nondistended, + BS MS: no deformity or atrophy Skin: warm and dry  Neuro:  CNs II-XII intact, Strength and sensation are intact Psych: Normal affect   EKG:  EKG is ordered today.  It demonstrates:   NSR, HR 91, LAD, nonspecific ST-T wave changes, QTc 42 ms   Recent Labs: 01/14/2014: Pro B Natriuretic peptide (BNP) 54690.0* 08/20/2014: B Natriuretic Peptide >4500.0*; TSH 2.329 08/26/2014: ALT 14* 09/07/2014: BUN 40*; Creatinine, Ser 5.34*; Magnesium 1.7; Platelets 235 09/11/2014: Hemoglobin 9.9*; Potassium 4.4; Sodium 135    Lipid Panel    Component Value Date/Time   CHOL 158 09/07/2014 0551   TRIG 116 09/07/2014 0551  HDL 46 09/07/2014 0551   CHOLHDL 3.4 09/07/2014 0551   VLDL 23 09/07/2014 0551   LDLCALC 89 09/07/2014 0551      ASSESSMENT AND PLAN:  1. Dilated Cardiomyopathy:  Recent admission with significant volume overload and PEA arrest occurring in the setting of dialysis catheter placement. He had a prolonged hospitalization and was treated with Arctic sun protocol and was intubated. Cardiomyopathy is out of proportion to CAD. He had a fat pad bx in the hospital that was neg for Amyloid.  He is on on hemodialysis for ESRD.  He is doing well clinically.  Suspect HTN heart disease led to his cardiomyopathy.  He has not been taking his beta-blocker or ACE inhibitor for a few weeks.  He has only been taking Hydralazine Twice daily.  He was not thought to need ICD implantation during his hospital stay.  It appears this was in part due to the possible dx of Amyloid.  Cardiac catheterization demonstrated single-vessel CAD and a nondominant RCA. Medical therapy was recommended.  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 HTN: Uncontrolled. He has been off of several of his medications. Resume medications as outlined above..  4. CAD: Cardiac catheterization demonstrated mild nonobstructive disease in the LAD and LCx. He did have 80% proximal RCA stenosis and a nondominant RCA which was treated medically. Continue aspirin and beta blocker. Will start atorvastatin 20 mg daily. Arrange follow-up lipids and LFTs in 4-6 weeks.  5. ESRD: He remains on Tuesday, Thursday, Saturday dialysis.  6. Diabetes Mellitus: Follow-up with primary care.     Medication Changes: Current medicines are reviewed at length with the patient today.  Concerns regarding medicines are as outlined above.  The following changes have been made:   Discontinued Medications   FERROUS SULFATE 325 (65 FE) MG TABLET    Take 1 tablet (325 mg total) by mouth daily with breakfast.   OXYCODONE (ROXICODONE) 5 MG IMMEDIATE RELEASE TABLET    Take 1 tablet (5 mg total) by mouth every 6 (six) hours as needed.   Modified Medications   Modified Medication Previous Medication   CARVEDILOL (COREG) 12.5 MG TABLET carvedilol (COREG) 12.5 MG tablet      Take 1 tablet (12.5 mg total) by mouth 2 (two) times daily with a meal.    Take 1 tablet (12.5 mg total) by mouth 2 (two) times daily with a meal.   HYDRALAZINE (APRESOLINE) 25 MG TABLET hydrALAZINE (APRESOLINE) 25 MG tablet      Take 2 tablets (50 mg total) by mouth 3 (three) times daily.    Take 2 tablets (50 mg total) by mouth 3 (three) times daily. MUST KEEP October APPOINTMENT   LISINOPRIL (PRINIVIL,ZESTRIL) 10 MG TABLET lisinopril (PRINIVIL,ZESTRIL) 10 MG tablet      Take 1  tablet (10 mg total) by mouth daily.    Take 1 tablet (10 mg total) by mouth daily. MUST KEEP October APPOINTMENT   New Prescriptions   No medications on file   Labs/ tests ordered today include:   Orders Placed This Encounter  Procedures  . EKG 12-Lead  Disposition:    He would like to FU in [facility] which is closer to his home.  Will have him FU with Dr. Kirk Ruths in [facility] in 1 month.      Signed, Versie Starks, MHS 11/18/2014 1:50 PM    [facility] [address], [place], [place]  [address] Phone: [phone]; Fax: [phone]

## 2014-11-18 ENCOUNTER — Encounter: Payer: Self-pay | Admitting: Physician Assistant

## 2014-11-18 ENCOUNTER — Ambulatory Visit (INDEPENDENT_AMBULATORY_CARE_PROVIDER_SITE_OTHER): Payer: 59 | Admitting: Physician Assistant

## 2014-11-18 ENCOUNTER — Telehealth: Payer: Self-pay | Admitting: Physician Assistant

## 2014-11-18 VITALS — BP 160/74 | HR 91 | Ht 72.0 in | Wt 174.4 lb

## 2014-11-18 DIAGNOSIS — I5042 Chronic combined systolic (congestive) and diastolic (congestive) heart failure: Secondary | ICD-10-CM

## 2014-11-18 DIAGNOSIS — I420 Dilated cardiomyopathy: Secondary | ICD-10-CM | POA: Diagnosis not present

## 2014-11-18 DIAGNOSIS — I2510 Atherosclerotic heart disease of native coronary artery without angina pectoris: Secondary | ICD-10-CM

## 2014-11-18 DIAGNOSIS — I10 Essential (primary) hypertension: Secondary | ICD-10-CM | POA: Diagnosis not present

## 2014-11-18 DIAGNOSIS — N186 End stage renal disease: Secondary | ICD-10-CM

## 2014-11-18 MED ORDER — ATORVASTATIN CALCIUM 20 MG PO TABS: 20.0000 mg | ORAL_TABLET | Freq: Every day | ORAL | Status: DC

## 2014-11-18 MED ORDER — HYDRALAZINE HCL 25 MG PO TABS: 50.0000 mg | ORAL_TABLET | Freq: Three times a day (TID) | ORAL | Status: DC

## 2014-11-18 MED ORDER — CARVEDILOL 12.5 MG PO TABS: 12.5000 mg | ORAL_TABLET | Freq: Two times a day (BID) | ORAL | Status: DC

## 2014-11-18 MED ORDER — LISINOPRIL 10 MG PO TABS: 10.0000 mg | ORAL_TABLET | Freq: Every day | ORAL | Status: DC

## 2014-11-18 NOTE — Telephone Encounter (Signed)
Lmptcb to advise FLP/LFT scheduled for 12/30/14.

## 2014-11-18 NOTE — Telephone Encounter (Signed)
Pt notified per Binger PA to start Lipitor 20 mg daily . Pt agreeable to plan of care and recommendations. Rx sent. FLP/LFT 6 weeks.

## 2014-11-18 NOTE — Telephone Encounter (Signed)
Pt notified per Uriah PA to start Lipitor 20 mg daily . Pt agreeable to plan of care and recommendations. Rx sent. FLP/LFT 6 weeks.

## 2014-11-18 NOTE — Patient Instructions (Signed)
Medication Instructions:  1. REFILLS HAVE BEEN SENT IN FOR COREG, LISINOPRIL, HYDRALAZINE  Labwork: NONE  Testing/Procedures: NONE  Follow-Up: DR. Stanford Breed IN 1 MONTH IN THE [place] [facility]  Any Other Special Instructions Will Be Listed Below (If Applicable).     If you need a refill on your cardiac medications before your next appointment, please call your pharmacy.

## 2014-11-18 NOTE — Telephone Encounter (Signed)
Please tell patient that I realized he is not on statin therapy after he left the office. He should take cholesterol medication to help prevent further blockages in the future. He did not have many blockages at this cath during his hospital stay and statin therapy is the medical treatment for that. Start Lipitor 20 mg QD Check Lipids and LFTs in 6 weeks.   Richardson Dopp, PA-C   11/18/2014 1:49 PM

## 2014-11-18 NOTE — Addendum Note (Signed)
Addended by: Michae Kava on: 11/18/2014 05:17 PM   Modules accepted: Orders

## 2014-12-13 ENCOUNTER — Other Ambulatory Visit: Payer: Self-pay | Admitting: Cardiology

## 2014-12-30 ENCOUNTER — Other Ambulatory Visit: Payer: 59

## 2014-12-31 ENCOUNTER — Other Ambulatory Visit: Payer: 59

## 2014-12-31 NOTE — Progress Notes (Signed)
HPI: FU cardiomyopathy, HTN, DM, stage V CKD and chronic systolic HF with baseline EF 15%. He was originally admitted to [facility] in December 2015, when he presented with acute on chronic renal failure and acute heart failure. Echocardiogram was obtained which showed EF was 15% with grade 2 diastolic dysfunction, mild MR/AR, moderate TR, small pericardial effusion. Prior to that, patient did not have medical follow-up for 2 years and was not taking any of his medication for up to a year. Both cardiology and nephrology team was consulted during that admission. He was seen by Dr. Johnsie Cancel, despite having Cr > 5, he was responding to IV Lasix at the time. Per Dr. Johnsie Cancel, echo showed echogenic myocardiogram that could represent amyloid with small pericardial effusion and EF 15% with elevated EDP on diastolic evaluation. There was a questionable mass in the pericardial space near the RV and AV groove likely represent epicardial fat. MRI could not be done due to advanced CKD. He was hesitant to start dialysis and was lost to FU with Cardiology and Nephrology.   He was then admitted in 7/16 with a/c biventricular HF with anasarca and pleural effusion. He was treated with paracentesis and CRRT. Decision was made to pursue hemodialysis due to ESRD. Patient developed bradycardic/PEA arrest during dialysis catheter placement requiring CPR x 10 minutes and eventual ROSC. He was intubated and treated with [employer] protocol. He recovered and was eventually extubated. He was started on hemodialysis. Echocardiogram demonstrated speckled myocardium and EF 10%. He was seen by Dr. Virl Axe for EP. ICD was not recommended at that time. However, cardiac cath was recommended. LHC demonstrated single vessel CAD and mod pulmonary HTN. Med Rx was recommended. Fat pad bx demonstrated no evidence of Amyloid. Note CT of cervical spine showed 2.5 cm right thyroid nodule and follow-up dedicated ultrasound  recommended.  Studies/Reports Reviewed Today:  LHC 09/04/14 LM: Normal LAD: Ostial 25% LCx: Proximal 20%, OM1 20% RCA: Proximal 80% Moderate pulmonary hypertension, normal LVEDP 1. Single vessel obstructive CAD involving a nondominant RCA 2. Moderate pulmonary HTN with normal LV filling pressures.  Recommendations: medical management.     Echo 08/22/14 Speckled appearance (consider amyloid versus ESRD), severe LVH, EF 10%, diffuse HK, grade 1 diastolic dysfunction, mild AI, mild LAE, mild RVE, severely reduced RVSF, mild RAE,? PFO (not fully visualize) large left pleural effusion  Since last seen, the patient denies any dyspnea on exertion, orthopnea, PND, pedal edema, palpitations, syncope or chest pain.   Current Outpatient Prescriptions  Medication Sig Dispense Refill  . aspirin 81 MG tablet Take 81 mg by mouth daily.    . calcitRIOL (ROCALTROL) 0.25 MCG capsule Take 1 capsule (0.25 mcg total) by mouth every Monday, Wednesday, and Friday. 30 capsule 1  . carvedilol (COREG) 12.5 MG tablet Take 1 tablet (12.5 mg total) by mouth 2 (two) times daily with a meal. 60 tablet 11  . glimepiride (AMARYL) 2 MG tablet Take 1 tablet (2 mg total) by mouth daily with breakfast. 30 tablet 1  . hydrALAZINE (APRESOLINE) 25 MG tablet TAKE 2 TABLETS(50 MG) BY MOUTH THREE TIMES DAILY 180 tablet 11  . lisinopril (PRINIVIL,ZESTRIL) 10 MG tablet Take 1 tablet (10 mg total) by mouth daily. 30 tablet 11  . multivitamin (RENA-VIT) TABS tablet Take 1 tablet by mouth at bedtime. 30 tablet 0   No current facility-administered medications for this visit.     Past Medical History  Diagnosis Date  . Hypertension   .  Diabetes mellitus without complication ([place])   . Cardiac arrest ([place])     07/2014-Arctic Sun initiated  . CAD (coronary artery disease)   . ESRD (end stage renal disease) [facility])     Past Surgical History  Procedure Laterality Date  . External ear surgery    . Tonsillectomy    .  Insertion of dialysis catheter N/A 08/21/2014    Procedure: INSERTION OF DIALYSIS CATHETER RIGHT INTERNAL JUGULAR VEIN;  Surgeon: Conrad Warsaw, MD;  Location: [place];  Service: Vascular;  Laterality: N/A;  MAC to General  . Cardiac catheterization N/A 09/04/2014    Procedure: Right/Left Heart Cath and Coronary Angiography;  Surgeon: Peter M Martinique, MD;  Location: [place] [facility];  Service: Cardiovascular;  Laterality: N/A;  . Av fistula placement Left 09/11/2014    Procedure: INSERTION OF LEFT ARM 6MM ARTERIOVENOUS GORTEX GRAFT ;  Surgeon: Elam Dutch, MD;  Location: [facility] OR;  Service: Vascular;  Laterality: Left;    Social History   Social History  . Marital Status: Married    Spouse Name: N/A  . Number of Children: N/A  . Years of Education: N/A   Occupational History  . Not on file.   Social History Main Topics  . Smoking status: Former Smoker    Types: Cigarettes  . Smokeless tobacco: Never Used  . Alcohol Use: No  . Drug Use: No  . Sexual Activity: Not on file   Other Topics Concern  . Not on file   Social History Narrative    ROS: no fevers or chills, productive cough, hemoptysis, dysphasia, odynophagia, melena, hematochezia, dysuria, hematuria, rash, seizure activity, orthopnea, PND, pedal edema, claudication. Remaining systems are negative.  Physical Exam: Well-developed well-nourished in no acute distress.  Skin is warm and dry.  HEENT is normal.  Neck is supple.  Chest is clear to auscultation with normal expansion.  Cardiovascular exam is regular rate and rhythm.  Abdominal exam nontender or distended. No masses palpated. Extremities show no edema. neuro grossly intact

## 2015-01-01 ENCOUNTER — Ambulatory Visit (INDEPENDENT_AMBULATORY_CARE_PROVIDER_SITE_OTHER): Payer: 59 | Admitting: Cardiology

## 2015-01-01 ENCOUNTER — Ambulatory Visit (HOSPITAL_BASED_OUTPATIENT_CLINIC_OR_DEPARTMENT_OTHER)
Admission: RE | Admit: 2015-01-01 | Discharge: 2015-01-01 | Disposition: A | Discharge status: Home / Self Care | Payer: 59 | Source: Ambulatory Visit | Attending: Cardiology | Admitting: Cardiology

## 2015-01-01 ENCOUNTER — Encounter: Payer: Self-pay | Admitting: Cardiology

## 2015-01-01 VITALS — BP 180/89 | HR 100 | Ht 72.0 in | Wt 173.8 lb

## 2015-01-01 DIAGNOSIS — I2510 Atherosclerotic heart disease of native coronary artery without angina pectoris: Secondary | ICD-10-CM

## 2015-01-01 DIAGNOSIS — E042 Nontoxic multinodular goiter: Secondary | ICD-10-CM | POA: Diagnosis not present

## 2015-01-01 DIAGNOSIS — Z79899 Other long term (current) drug therapy: Secondary | ICD-10-CM

## 2015-01-01 DIAGNOSIS — E785 Hyperlipidemia, unspecified: Secondary | ICD-10-CM

## 2015-01-01 DIAGNOSIS — E041 Nontoxic single thyroid nodule: Secondary | ICD-10-CM

## 2015-01-01 DIAGNOSIS — I10 Essential (primary) hypertension: Secondary | ICD-10-CM

## 2015-01-01 DIAGNOSIS — I509 Heart failure, unspecified: Secondary | ICD-10-CM

## 2015-01-01 HISTORY — DX: Nontoxic single thyroid nodule: E04.1

## 2015-01-01 HISTORY — DX: Hyperlipidemia, unspecified: E78.5

## 2015-01-01 MED ORDER — ATORVASTATIN CALCIUM 40 MG PO TABS: 40.0000 mg | ORAL_TABLET | Freq: Every day | ORAL | Status: DC

## 2015-01-01 MED ORDER — CARVEDILOL 25 MG PO TABS: 25.0000 mg | ORAL_TABLET | Freq: Two times a day (BID) | ORAL | Status: DC

## 2015-01-01 NOTE — Assessment & Plan Note (Signed)
Schedule thyroid ultrasound to further assess.

## 2015-01-01 NOTE — Assessment & Plan Note (Signed)
Management per nephrology.

## 2015-01-01 NOTE — Patient Instructions (Addendum)
Medication Instructions:  STOP Hydralazine START Atorvastatin 40 mg - take 1 tablet by mouth daily. INCREASE Carvedilol to 25 mg.  >>You may take 2 tablets twice daily until the 12.5 mg tablets run out  >>A new prescription has been sent to the pharmacy electronically.  Labwork: Your physician recommends that you return for lab work in 41 weeks - FASTING.  Testing/Procedures: 1. Your physician has requested that you have an ultrasound of your thyroid. 2. Your physician has requested that you have an echocardiogram in 3 months. Echocardiography is a painless test that uses sound waves to create images of your heart. It provides your doctor with information about the size and shape of your heart and how well your heart's chambers and valves are working. This procedure takes approximately one hour. There are no restrictions for this procedure.  Follow-Up: Dr Stanford Breed recommends that you schedule a follow-up appointment in 3 months (after your echocardiogram).  If you need a refill on your cardiac medications before your next appointment, please call your pharmacy.   **Please call after the first of the year to give [place] your new insurance information!!

## 2015-01-01 NOTE — Assessment & Plan Note (Signed)
Continue aspirin. Add Lipitor 40 mg daily. Check lipids and liver in 4 weeks.

## 2015-01-01 NOTE — Assessment & Plan Note (Signed)
Add Lipitor 40 mg daily. Check lipids and liver in 4 weeks.

## 2015-01-01 NOTE — Assessment & Plan Note (Signed)
Blood pressure is elevated. However he has not taken his medications this morning.He states on dialysis days it begins at approximately 130 and at the end of dialysis is 100. I will try and consolidate his regimen. Continue Lisinopril. Discontinue hydralazine. Increase carvedilol to 25 mg twice a day. Titrate medications as needed.

## 2015-01-01 NOTE — Assessment & Plan Note (Signed)
Patient's cardiomyopathy is likely secondary to uncontrolled hypertension. His blood pressure is improving. Continue lisinopril. We will discontinue hydralazine to try and consolidate medications. Increase carvedilol to 25 mg twice a day. Follow for worsening hypotension on dialysis days. Repeat echocardiogram in 3 months. Hopefully his LV function will have improved with controlling his blood pressure. Previous fat pad biopsy negative for amyloid.

## 2015-01-17 ENCOUNTER — Encounter: Payer: Self-pay | Admitting: *Deleted

## 2015-04-02 ENCOUNTER — Other Ambulatory Visit (HOSPITAL_BASED_OUTPATIENT_CLINIC_OR_DEPARTMENT_OTHER): Payer: 59

## 2015-04-02 ENCOUNTER — Ambulatory Visit (HOSPITAL_BASED_OUTPATIENT_CLINIC_OR_DEPARTMENT_OTHER)
Admission: RE | Admit: 2015-04-02 | Discharge: 2015-04-02 | Disposition: A | Discharge status: Home / Self Care | Payer: BLUE CROSS/BLUE SHIELD | Source: Ambulatory Visit | Attending: Cardiology | Admitting: Cardiology

## 2015-04-02 DIAGNOSIS — N186 End stage renal disease: Secondary | ICD-10-CM | POA: Insufficient documentation

## 2015-04-02 DIAGNOSIS — E1122 Type 2 diabetes mellitus with diabetic chronic kidney disease: Secondary | ICD-10-CM | POA: Insufficient documentation

## 2015-04-02 DIAGNOSIS — I132 Hypertensive heart and chronic kidney disease with heart failure and with stage 5 chronic kidney disease, or end stage renal disease: Secondary | ICD-10-CM | POA: Insufficient documentation

## 2015-04-02 DIAGNOSIS — I509 Heart failure, unspecified: Secondary | ICD-10-CM

## 2015-04-02 DIAGNOSIS — I5022 Chronic systolic (congestive) heart failure: Secondary | ICD-10-CM | POA: Insufficient documentation

## 2015-04-02 LAB — ECHOCARDIOGRAM COMPLETE
EWDT: 275 ms
FS: 34 % (ref 28–44)
IV/PV OW: 1.01
LA diam end sys: 38 cm
LV PW d: 13.3 mm — AB (ref 0.6–1.1)
LVOT area: 3.14 cm2
MV pk A vel: 128 m/s
MV pk E vel: 75 m/s
MVPG: 2 mmHg

## 2015-04-02 NOTE — Progress Notes (Signed)
  Echocardiogram 2D Echocardiogram has been performed.  Kyle [name] 04/02/2015, 10:21 AM

## 2015-04-08 NOTE — Progress Notes (Signed)
HPI: FU cardiomyopathy, HTN, DM, stage V CKD and chronic systolic HF with baseline EF 15%. He was originally admitted to [facility] in December 2015, when he presented with acute on chronic renal failure and acute heart failure. Echocardiogram was obtained which showed EF was 15% with grade 2 diastolic dysfunction, mild MR/AR, moderate TR, small pericardial effusion. Prior to that, patient did not have medical follow-up for 2 years and was not taking any of his medication for up to a year. Both cardiology and nephrology team was consulted during that admission. Per Dr. Johnsie Cancel, echo showed echogenic myocardiogram that could represent amyloid with small pericardial effusion and EF 15% with elevated EDP on diastolic evaluation. There was a questionable mass in the pericardial space near the RV and AV groove likely represent epicardial fat. MRI could not be done due to advanced CKD. He was hesitant to start dialysis and was lost to FU with Cardiology and Nephrology.   He was then admitted in 7/16 with a/c biventricular HF with anasarca and pleural effusion. He was treated with paracentesis and CRRT. Decision was made to pursue hemodialysis due to ESRD. Patient developed bradycardic/PEA arrest during dialysis catheter placement requiring CPR x 10 minutes and eventual ROSC. He was intubated and treated with [employer] protocol. He recovered and was eventually extubated. He was started on hemodialysis. Echocardiogram demonstrated speckled myocardium and EF 10%. He was seen by Dr. Virl Axe for EP. ICD was not recommended at that time. However, cardiac cath was recommended. LHC demonstrated single vessel CAD and mod pulmonary HTN. Med Rx was recommended. Fat pad bx demonstrated no evidence of Amyloid. Note CT of cervical spine showed 2.5 cm right thyroid nodule and follow-up dedicated ultrasound recommended. Thyroid ultrasound December 2016 showed bilateral thyroid nodules and biopsy was  recommended. Attempts at contacting patient by phone was unsuccessful and C and to follow-up with his primary care. Follow-up echocardiogram  March 2017 showed normal LV systolic function, grade 1 diastolic dysfunction and mild biatrial enlargement.  Studies/Reports Reviewed Today:  LHC 09/04/14 LM: Normal LAD: Ostial 25% LCx: Proximal 20%, OM1 20% RCA: Proximal 80% Moderate pulmonary hypertension, normal LVEDP 1. Single vessel obstructive CAD involving a nondominant RCA 2. Moderate pulmonary HTN with normal LV filling pressures.  Recommendations: medical management.     Echo 08/22/14 Speckled appearance (consider amyloid versus ESRD), severe LVH, EF 10%, diffuse HK, grade 1 diastolic dysfunction, mild AI, mild LAE, mild RVE, severely reduced RVSF, mild RAE,? PFO (not fully visualize) large left pleural effusion  Since last seen, the patient denies any dyspnea on exertion, orthopnea, PND, pedal edema, palpitations, syncope or chest pain.  Current Outpatient Prescriptions  Medication Sig Dispense Refill  . aspirin 81 MG tablet Take 81 mg by mouth daily.    Marland Kitchen atorvastatin (LIPITOR) 40 MG tablet Take 1 tablet (40 mg total) by mouth daily. 30 tablet 11  . carvedilol (COREG) 25 MG tablet Take 1 tablet (25 mg total) by mouth 2 (two) times daily with a meal. 60 tablet 11  . glimepiride (AMARYL) 2 MG tablet Take 1 tablet (2 mg total) by mouth daily with breakfast. 30 tablet 1  . lisinopril (PRINIVIL,ZESTRIL) 20 MG tablet Take 1 tablet by mouth daily.  6   No current facility-administered medications for this visit.     Past Medical History  Diagnosis Date  . Hypertension   . Diabetes mellitus without complication ([place])   . Cardiac arrest ([place])     07/2014-Arctic  Sun initiated  . CAD (coronary artery disease)   . ESRD (end stage renal disease) [facility])     Past Surgical History  Procedure Laterality Date  . External ear surgery    . Tonsillectomy    . Insertion of dialysis catheter  N/A 08/21/2014    Procedure: INSERTION OF DIALYSIS CATHETER RIGHT INTERNAL JUGULAR VEIN;  Surgeon: Conrad [place], MD;  Location: [place];  Service: Vascular;  Laterality: N/A;  MAC to General  . Cardiac catheterization N/A 09/04/2014    Procedure: Right/Left Heart Cath and Coronary Angiography;  Surgeon: Peter M Martinique, MD;  Location: [place] CV LAB;  Service: Cardiovascular;  Laterality: N/A;  . Av fistula placement Left 09/11/2014    Procedure: INSERTION OF LEFT ARM 6MM ARTERIOVENOUS GORTEX GRAFT ;  Surgeon: Elam Dutch, MD;  Location: [facility] OR;  Service: Vascular;  Laterality: Left;    Social History   Social History  . Marital Status: Married    Spouse Name: N/A  . Number of Children: N/A  . Years of Education: N/A   Occupational History  . Not on file.   Social History Main Topics  . Smoking status: Former Smoker    Types: Cigarettes  . Smokeless tobacco: Never Used  . Alcohol Use: No  . Drug Use: No  . Sexual Activity: Not on file   Other Topics Concern  . Not on file   Social History Narrative    Family History  Problem Relation Age of Onset  . Heart failure Mother   . Diabetes Mother   . Hypertension Mother   . Diabetes Father     ROS: Some weakness after dialysis and low blood pressure but no fevers or chills, productive cough, hemoptysis, dysphasia, odynophagia, melena, hematochezia, dysuria, hematuria, rash, seizure activity, orthopnea, PND, pedal edema, claudication. Remaining systems are negative.  Physical Exam: Well-developed well-nourished in no acute distress.  Skin is warm and dry.  HEENT is normal.  Neck is supple.  Chest is clear to auscultation with normal expansion.  Cardiovascular exam is regular rate and rhythm.  Abdominal exam nontender or distended. No masses palpated. Extremities show no edema. neuro grossly intact

## 2015-04-09 ENCOUNTER — Ambulatory Visit (INDEPENDENT_AMBULATORY_CARE_PROVIDER_SITE_OTHER): Payer: BLUE CROSS/BLUE SHIELD | Admitting: Cardiology

## 2015-04-09 ENCOUNTER — Encounter: Payer: Self-pay | Admitting: Cardiology

## 2015-04-09 VITALS — BP 105/62 | HR 84 | Ht 72.0 in | Wt 173.0 lb

## 2015-04-09 DIAGNOSIS — I429 Cardiomyopathy, unspecified: Secondary | ICD-10-CM

## 2015-04-09 DIAGNOSIS — E785 Hyperlipidemia, unspecified: Secondary | ICD-10-CM | POA: Diagnosis not present

## 2015-04-09 DIAGNOSIS — I2510 Atherosclerotic heart disease of native coronary artery without angina pectoris: Secondary | ICD-10-CM

## 2015-04-09 DIAGNOSIS — I10 Essential (primary) hypertension: Secondary | ICD-10-CM

## 2015-04-09 DIAGNOSIS — E041 Nontoxic single thyroid nodule: Secondary | ICD-10-CM

## 2015-04-09 MED ORDER — CARVEDILOL 12.5 MG PO TABS: 12.5000 mg | ORAL_TABLET | Freq: Two times a day (BID) | ORAL | Status: DC

## 2015-04-09 NOTE — Assessment & Plan Note (Signed)
Blood pressure is running low on dialysis days. He is feeling weakAnd systolic ranges in the [id number]. Decrease Coreg to 12.5 mg twice a day and follow.

## 2015-04-09 NOTE — Assessment & Plan Note (Signed)
Patient has a thyroid nodule which needs biopsy. I have instructed him to follow-up with primary care for this issue. I have also instructed him to contact me if there is any delays and I would arrange further evaluation.

## 2015-04-09 NOTE — Assessment & Plan Note (Signed)
Continue statin. 

## 2015-04-09 NOTE — Assessment & Plan Note (Signed)
Previous reduction in LV function felt secondary to uncontrolled hypertension. Now much improved in LV function has normalized. Continue beta blocker and ACE inhibitor.

## 2015-04-09 NOTE — Assessment & Plan Note (Signed)
Continue aspirin and statin. 

## 2015-04-09 NOTE — Patient Instructions (Signed)
Medication Instructions:   DECREASE CARVEDILOL TO 12.5 MG TWICE DAILY= 1/2 OF THE 25 MG TABLET TWICE DAILY  Follow-Up:  Your physician wants you to follow-up in: [place] will receive a reminder letter in the mail two months in advance. If you don't receive a letter, please call our office to schedule the follow-up appointment.

## 2015-04-11 DIAGNOSIS — E119 Type 2 diabetes mellitus without complications: Secondary | ICD-10-CM

## 2015-04-11 DIAGNOSIS — Z8674 Personal history of sudden cardiac arrest: Secondary | ICD-10-CM

## 2015-04-11 HISTORY — DX: Personal history of sudden cardiac arrest: Z86.74

## 2015-04-11 HISTORY — DX: Type 2 diabetes mellitus without complications: E11.9

## 2015-08-29 ENCOUNTER — Encounter (HOSPITAL_COMMUNITY): Payer: Self-pay | Admitting: Emergency Medicine

## 2015-08-29 ENCOUNTER — Emergency Department (HOSPITAL_COMMUNITY): Payer: BLUE CROSS/BLUE SHIELD

## 2015-08-29 ENCOUNTER — Inpatient Hospital Stay (HOSPITAL_COMMUNITY)
Admission: EM | Admit: 2015-08-29 | Discharge: 2015-09-01 | DRG: 252 | Disposition: A | Discharge status: Home Health | Payer: BLUE CROSS/BLUE SHIELD | Attending: Student in an Organized Health Care Education/Training Program | Admitting: Student in an Organized Health Care Education/Training Program

## 2015-08-29 DIAGNOSIS — R7881 Bacteremia: Secondary | ICD-10-CM | POA: Diagnosis not present

## 2015-08-29 DIAGNOSIS — Y832 Surgical operation with anastomosis, bypass or graft as the cause of abnormal reaction of the patient, or of later complication, without mention of misadventure at the time of the procedure: Secondary | ICD-10-CM | POA: Diagnosis present

## 2015-08-29 DIAGNOSIS — E1129 Type 2 diabetes mellitus with other diabetic kidney complication: Secondary | ICD-10-CM | POA: Diagnosis present

## 2015-08-29 DIAGNOSIS — E11649 Type 2 diabetes mellitus with hypoglycemia without coma: Secondary | ICD-10-CM | POA: Diagnosis present

## 2015-08-29 DIAGNOSIS — D631 Anemia in chronic kidney disease: Secondary | ICD-10-CM | POA: Diagnosis present

## 2015-08-29 DIAGNOSIS — T82898A Other specified complication of vascular prosthetic devices, implants and grafts, initial encounter: Secondary | ICD-10-CM

## 2015-08-29 DIAGNOSIS — Z992 Dependence on renal dialysis: Secondary | ICD-10-CM

## 2015-08-29 DIAGNOSIS — Z8249 Family history of ischemic heart disease and other diseases of the circulatory system: Secondary | ICD-10-CM

## 2015-08-29 DIAGNOSIS — N186 End stage renal disease: Secondary | ICD-10-CM

## 2015-08-29 DIAGNOSIS — E1122 Type 2 diabetes mellitus with diabetic chronic kidney disease: Secondary | ICD-10-CM | POA: Diagnosis present

## 2015-08-29 DIAGNOSIS — Z87891 Personal history of nicotine dependence: Secondary | ICD-10-CM

## 2015-08-29 DIAGNOSIS — E785 Hyperlipidemia, unspecified: Secondary | ICD-10-CM | POA: Diagnosis present

## 2015-08-29 DIAGNOSIS — IMO0002 Reserved for concepts with insufficient information to code with codable children: Secondary | ICD-10-CM | POA: Diagnosis present

## 2015-08-29 DIAGNOSIS — Z833 Family history of diabetes mellitus: Secondary | ICD-10-CM

## 2015-08-29 DIAGNOSIS — Z7982 Long term (current) use of aspirin: Secondary | ICD-10-CM

## 2015-08-29 DIAGNOSIS — R000 Tachycardia, unspecified: Secondary | ICD-10-CM

## 2015-08-29 DIAGNOSIS — I10 Essential (primary) hypertension: Secondary | ICD-10-CM | POA: Diagnosis present

## 2015-08-29 DIAGNOSIS — I2510 Atherosclerotic heart disease of native coronary artery without angina pectoris: Secondary | ICD-10-CM | POA: Diagnosis present

## 2015-08-29 DIAGNOSIS — Z8674 Personal history of sudden cardiac arrest: Secondary | ICD-10-CM

## 2015-08-29 DIAGNOSIS — T827XXA Infection and inflammatory reaction due to other cardiac and vascular devices, implants and grafts, initial encounter: Secondary | ICD-10-CM

## 2015-08-29 DIAGNOSIS — B9561 Methicillin susceptible Staphylococcus aureus infection as the cause of diseases classified elsewhere: Secondary | ICD-10-CM

## 2015-08-29 DIAGNOSIS — I120 Hypertensive chronic kidney disease with stage 5 chronic kidney disease or end stage renal disease: Secondary | ICD-10-CM | POA: Diagnosis present

## 2015-08-29 DIAGNOSIS — Z419 Encounter for procedure for purposes other than remedying health state, unspecified: Secondary | ICD-10-CM

## 2015-08-29 DIAGNOSIS — Z79899 Other long term (current) drug therapy: Secondary | ICD-10-CM

## 2015-08-29 DIAGNOSIS — E1165 Type 2 diabetes mellitus with hyperglycemia: Secondary | ICD-10-CM | POA: Diagnosis present

## 2015-08-29 HISTORY — DX: Bacteremia: R78.81

## 2015-08-29 HISTORY — DX: Infection and inflammatory reaction due to other cardiac and vascular devices, implants and grafts, initial encounter: T82.7XXA

## 2015-08-29 HISTORY — DX: Methicillin susceptible Staphylococcus aureus infection as the cause of diseases classified elsewhere: R78.81

## 2015-08-29 HISTORY — DX: Methicillin susceptible Staphylococcus aureus infection as the cause of diseases classified elsewhere: B95.61

## 2015-08-29 LAB — URINE MICROSCOPIC-ADD ON: Bacteria, UA: NONE SEEN

## 2015-08-29 LAB — CBC WITH DIFFERENTIAL/PLATELET
BASOS ABS: 0 10*3/uL (ref 0.0–0.1)
Basophils Relative: 0 %
EOS ABS: 0 10*3/uL (ref 0.0–0.7)
EOS PCT: 0 %
HCT: 34.7 % — ABNORMAL LOW (ref 39.0–52.0)
Hemoglobin: 11.4 g/dL — ABNORMAL LOW (ref 13.0–17.0)
LYMPHS PCT: 5 %
Lymphs Abs: 0.7 10*3/uL (ref 0.7–4.0)
MCH: 31.1 pg (ref 26.0–34.0)
MCHC: 32.9 g/dL (ref 30.0–36.0)
MCV: 94.6 fL (ref 78.0–100.0)
MONO ABS: 0.9 10*3/uL (ref 0.1–1.0)
Monocytes Relative: 6 %
Neutro Abs: 14 10*3/uL — ABNORMAL HIGH (ref 1.7–7.7)
Neutrophils Relative %: 89 %
PLATELETS: 171 10*3/uL (ref 150–400)
RBC: 3.67 MIL/uL — AB (ref 4.22–5.81)
RDW: 15.1 % (ref 11.5–15.5)
WBC: 15.7 10*3/uL — AB (ref 4.0–10.5)

## 2015-08-29 LAB — COMPREHENSIVE METABOLIC PANEL
ALBUMIN: 3.6 g/dL (ref 3.5–5.0)
ALT: 20 U/L (ref 17–63)
AST: 25 U/L (ref 15–41)
Alkaline Phosphatase: 65 U/L (ref 38–126)
Anion gap: 14 (ref 5–15)
BILIRUBIN TOTAL: 0.6 mg/dL (ref 0.3–1.2)
BUN: 33 mg/dL — AB (ref 6–20)
CHLORIDE: 95 mmol/L — AB (ref 101–111)
CO2: 26 mmol/L (ref 22–32)
CREATININE: 9.49 mg/dL — AB (ref 0.61–1.24)
Calcium: 8.6 mg/dL — ABNORMAL LOW (ref 8.9–10.3)
GFR calc Af Amer: 6 mL/min — ABNORMAL LOW (ref >60)
GFR, EST NON AFRICAN AMERICAN: 5 mL/min — AB (ref >60)
GLUCOSE: 62 mg/dL — AB (ref 65–99)
POTASSIUM: 3.4 mmol/L — AB (ref 3.5–5.1)
Sodium: 135 mmol/L (ref 135–145)
Total Protein: 7.8 g/dL (ref 6.5–8.1)

## 2015-08-29 LAB — URINALYSIS, ROUTINE W REFLEX MICROSCOPIC
Bilirubin Urine: NEGATIVE
Glucose, UA: 250 mg/dL — AB
KETONES UR: NEGATIVE mg/dL
LEUKOCYTES UA: NEGATIVE
NITRITE: NEGATIVE
PROTEIN: 100 mg/dL — AB
Specific Gravity, Urine: 1.012 (ref 1.005–1.030)
pH: 8 (ref 5.0–8.0)

## 2015-08-29 LAB — CBG MONITORING, ED
GLUCOSE-CAPILLARY: 103 mg/dL — AB (ref 65–99)
Glucose-Capillary: 53 mg/dL — ABNORMAL LOW (ref 65–99)

## 2015-08-29 LAB — I-STAT CG4 LACTIC ACID, ED
LACTIC ACID, VENOUS: 1.79 mmol/L (ref 0.5–1.9)
LACTIC ACID, VENOUS: 2.01 mmol/L — AB (ref 0.5–1.9)

## 2015-08-29 LAB — GLUCOSE, CAPILLARY
Glucose-Capillary: 222 mg/dL — ABNORMAL HIGH (ref 65–99)
Glucose-Capillary: 104 mg/dL — ABNORMAL HIGH (ref 65–99)

## 2015-08-29 MED ORDER — LISINOPRIL 20 MG PO TABS: 20.0000 mg | ORAL_TABLET | Freq: Every day | ORAL | Status: DC

## 2015-08-29 MED ORDER — ACETAMINOPHEN 325 MG PO TABS
650.0000 mg | ORAL_TABLET | Freq: Four times a day (QID) | ORAL | Status: DC | PRN
  Administered 2015-08-29: 650 mg via ORAL

## 2015-08-29 MED ORDER — INSULIN ASPART 100 UNIT/ML ~~LOC~~ SOLN: 0.0000 [IU] | Freq: Every day | SUBCUTANEOUS | Status: DC

## 2015-08-29 MED ORDER — ACETAMINOPHEN 650 MG RE SUPP: 650.0000 mg | Freq: Four times a day (QID) | RECTAL | Status: DC | PRN

## 2015-08-29 MED ORDER — VANCOMYCIN HCL IN DEXTROSE 750-5 MG/150ML-% IV SOLN
750.0000 mg | Freq: Once | INTRAVENOUS | Status: AC
  Administered 2015-08-29: 750 mg via INTRAVENOUS
  Filled 2015-08-29: qty 150

## 2015-08-29 MED ORDER — DOXERCALCIFEROL 4 MCG/2ML IV SOLN
5.0000 ug | INTRAVENOUS | Status: DC
  Administered 2015-09-01: 5 ug via INTRAVENOUS
  Filled 2015-08-29: qty 4

## 2015-08-29 MED ORDER — DEXTROSE 5 % IV SOLN
2.0000 g | Freq: Once | INTRAVENOUS | Status: AC
  Administered 2015-08-29: 2 g via INTRAVENOUS
  Filled 2015-08-29: qty 2

## 2015-08-29 MED ORDER — RENA-VITE PO TABS
1.0000 | ORAL_TABLET | Freq: Every day | ORAL | Status: DC
  Administered 2015-08-29 – 2015-08-31 (×3): 1 via ORAL
  Filled 2015-08-29 (×3): qty 1

## 2015-08-29 MED ORDER — ONDANSETRON HCL 4 MG/2ML IJ SOLN
4.0000 mg | Freq: Three times a day (TID) | INTRAMUSCULAR | Status: AC | PRN
Start: 2015-08-29 — End: 2015-08-30

## 2015-08-29 MED ORDER — ATORVASTATIN CALCIUM 40 MG PO TABS
40.0000 mg | ORAL_TABLET | Freq: Every day | ORAL | Status: DC
  Administered 2015-08-29 – 2015-08-31 (×3): 40 mg via ORAL
  Filled 2015-08-29 (×3): qty 1

## 2015-08-29 MED ORDER — SODIUM CHLORIDE 0.9 % IV BOLUS (SEPSIS)
500.0000 mL | Freq: Once | INTRAVENOUS | Status: AC
Start: 2015-08-29 — End: 2015-08-29
  Administered 2015-08-29: 500 mL via INTRAVENOUS

## 2015-08-29 MED ORDER — VANCOMYCIN HCL IN DEXTROSE 1-5 GM/200ML-% IV SOLN
1000.0000 mg | INTRAVENOUS | Status: DC
  Filled 2015-08-29: qty 200

## 2015-08-29 MED ORDER — HEPARIN SODIUM (PORCINE) 5000 UNIT/ML IJ SOLN
5000.0000 [IU] | Freq: Three times a day (TID) | INTRAMUSCULAR | Status: DC
  Administered 2015-08-29 – 2015-09-01 (×7): 5000 [IU] via SUBCUTANEOUS
  Filled 2015-08-29 (×8): qty 1

## 2015-08-29 MED ORDER — CARVEDILOL 12.5 MG PO TABS: 12.5000 mg | ORAL_TABLET | Freq: Two times a day (BID) | ORAL | Status: DC

## 2015-08-29 MED ORDER — INSULIN ASPART 100 UNIT/ML ~~LOC~~ SOLN
0.0000 [IU] | Freq: Three times a day (TID) | SUBCUTANEOUS | Status: DC
Start: 2015-08-29 — End: 2015-09-01
  Administered 2015-08-29: 3 [IU] via SUBCUTANEOUS
  Administered 2015-08-31: 1 [IU] via SUBCUTANEOUS
  Administered 2015-08-31 – 2015-09-01 (×2): 2 [IU] via SUBCUTANEOUS
  Administered 2015-09-01 (×2): 1 [IU] via SUBCUTANEOUS

## 2015-08-29 MED ORDER — VANCOMYCIN HCL IN DEXTROSE 1-5 GM/200ML-% IV SOLN
1000.0000 mg | Freq: Once | INTRAVENOUS | Status: AC
  Administered 2015-08-29: 1000 mg via INTRAVENOUS
  Filled 2015-08-29: qty 200

## 2015-08-29 MED ORDER — CEFTAZIDIME 2 G IJ SOLR
2.0000 g | INTRAMUSCULAR | Status: DC
  Filled 2015-08-29: qty 2

## 2015-08-29 NOTE — ED Notes (Signed)
Dr. Sabra Heck notified of 2.01 lactic.

## 2015-08-29 NOTE — H&P (Signed)
Date: 08/29/2015               Patient Name:  Kyle [name] MRN: [id number]  DOB: 23-Jun-1955 Age / Sex: 60 y.o., male   PCP: [name], MD         Medical Service: Internal Medicine Teaching Service         Attending Physician: Dr. Axel Filler, MD    First Contact: Dr. Holley Raring Pager: [phone]  Second Contact: Dr. Charlott Rakes Pager: [phone]       After Hours (After 5p/  First Contact Pager: [phone]  weekends / holidays): Second Contact Pager: [phone]   Chief Complaint: infection of atriovenous graft  History of Present Illness: [name] is a 60 y.o. male with a h/o of ESRD, HTN, DM, CAD who presents with concern for infection of his dialysis vascular access graft.  Pt reports 1 day h/o feeling unwell with increased fatigue after his dialysis on Thursday. He reports subjective fevers and chills, but denies sweats, abdominal pain, or urinary symptoms. He reports that he presented to his new dialysis center this morning for treatment where he was found to be tachycardic and noted to have some erythema and firmness around his dialysis graft. He was referred to the ED for further evaluation and management.  He has had several recent episode of graft failure d/t clotting, most recently earlier this week. These clots have been removed and the graft stented by vascular surgery.  Today, pt is feeling generally well w/o acute complaints. He does endorse some increased fatigue. He has a good appetite and is pleasant and conversant during our interview. He denies any localized pain to his graft and reports that he feels like his graft is unchanged from its baseline in terms of appearance and warmth. He denies any subjective fevers or chills, N/V, changes to BM, urinary urgency or pain, SOB, or CP.  Since presentation to the ED, he has received IV Vanc and Cefipime, and been evaluated with CXR and blood work. He was noted to have leukocytosis at that time. He  has remained afebrile, but mildly tachycardic.  Meds: Current Facility-Administered Medications  Medication Dose Route Frequency Provider Last Rate Last Dose  . acetaminophen (TYLENOL) tablet 650 mg  650 mg Oral Q6H PRN Norman Herrlich, MD       Or  . acetaminophen (TYLENOL) suppository 650 mg  650 mg Rectal Q6H PRN Norman Herrlich, MD      . heparin injection 5,000 Units  5,000 Units Subcutaneous Q8H Norman Herrlich, MD       Current Outpatient Prescriptions  Medication Sig Dispense Refill  . aspirin 81 MG tablet Take 81 mg by mouth daily.    Marland Kitchen atorvastatin (LIPITOR) 40 MG tablet Take 1 tablet (40 mg total) by mouth daily. 30 tablet 11  . B Complex-C-Folic Acid (RENA-VITE RX) 1 MG TABS Take 1 tablet by mouth daily.  6  . carvedilol (COREG) 12.5 MG tablet Take 1 tablet (12.5 mg total) by mouth 2 (two) times daily with a meal. 180 tablet 3  . glimepiride (AMARYL) 2 MG tablet Take 1 tablet (2 mg total) by mouth daily with breakfast. 30 tablet 1  . lisinopril (PRINIVIL,ZESTRIL) 20 MG tablet Take 20 mg by mouth daily.   6    Allergies: Allergies as of 08/29/2015  . (No Known Allergies)   Past Medical History:  Diagnosis Date  . CAD (coronary artery disease)   .  Cardiac arrest ([place])    07/2014-Arctic Sun initiated  . Diabetes mellitus without complication ([place])   . ESRD (end stage renal disease) ([place])   . Hypertension     Family History: Pt has not family history of renal disease. His Mother has h/o CHF and HTN. And both mother and father has a h/o DM.  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  Review of Systems: A complete ROS was negative except as per HPI. Review of Systems  Constitutional: Positive for fever and malaise/fatigue. Negative for chills, diaphoresis and weight loss.  Eyes: Negative for blurred vision.    Respiratory: Negative for cough and shortness of breath.   Cardiovascular: Negative for chest pain and leg swelling.  Gastrointestinal: Negative for abdominal pain, constipation, diarrhea, nausea and vomiting.  Genitourinary: Negative for dysuria, frequency and urgency.  Musculoskeletal: Negative for myalgias.  Skin: Negative for rash.  Neurological: Positive for weakness. Negative for dizziness, tremors and headaches.  Endo/Heme/Allergies: Negative for polydipsia.  Psychiatric/Behavioral: The patient is not nervous/anxious.     Physical Exam: Vitals:   08/29/15 1230 08/29/15 1300 08/29/15 1330 08/29/15 1338  BP: (!) 97/50 102/65 115/63 115/63  Pulse: 89 92 92 90  Resp:    22  Temp:    98.6 F (37 C)  TempSrc:    Oral  SpO2: 99% 99% 100% 99%  Weight:       Physical Exam  Constitutional: He is oriented to person, place, and time. He appears well-developed and well-nourished. He is cooperative. No distress.  HENT:  Head: Normocephalic and atraumatic.  Right Ear: Hearing normal.  Left Ear: Hearing normal.  Nose: Nose normal.  Mouth/Throat: Mucous membranes are normal.  Cardiovascular: Normal rate, regular rhythm, S1 normal, S2 normal and intact distal pulses.  Exam reveals no gallop.   No murmur heard. Pulses:      Radial pulses are 2+ on the right side, and 2+ on the left side.  Pt has dialysis vascular access graft below the ante-cubital fossa. There is a palpable thrill throughout, good radial pulse, and the graft is soft and non-tender. Light erythema over the proximal graft near the antecubital fossa, without significant warmth. Several well healing wounds from recent clot removal procedures.  Pulmonary/Chest: Effort normal and breath sounds normal. No respiratory distress. He has no wheezes. He has no rhonchi. He has no rales. He exhibits no tenderness.  Abdominal: Soft. Normal appearance and bowel sounds are normal. He exhibits no ascites. There is no hepatosplenomegaly.  There is no tenderness.  Musculoskeletal: He exhibits no edema.  Neurological: He is alert and oriented to person, place, and time. He has normal strength.  Skin: Skin is warm, dry and intact. Capillary refill takes less than 2 seconds. He is not diaphoretic.  Psychiatric: He has a normal mood and affect. His speech is normal and behavior is normal.  Vitals reviewed.  Labs: CBC:  Recent Labs Lab 08/29/15 1010  WBC 15.7*  NEUTROABS 14.0*  HGB 11.4*  HCT 34.7*  MCV 94.6  PLT [id number]   Basic Metabolic Panel:  Recent Labs Lab 08/29/15 1010  NA 135  K 3.4*  CL 95*  CO2 26  GLUCOSE 62*  BUN 33*  CREATININE 9.49*  CALCIUM 8.6*   Liver Function Tests:  Recent Labs Lab 08/29/15 1010  AST 25  ALT 20  ALKPHOS 65  BILITOT 0.6  PROT 7.8  ALBUMIN 3.6   Lactic Acid, Venous    Component Value Date/Time   LATICACIDVEN 2.01 (HH) 08/29/2015 1321   BG: Lab Results  Component Value Date   HGBA1C 7.6 (H) 08/20/2014    Recent Labs Lab 08/29/15 0930 08/29/15 1049  GLUCAP 53* 103*   Microbiology: Results for orders placed or performed during the hospital encounter of 08/29/15  Culture, blood (Routine x 2)     Status: None (Preliminary result)   Collection Time: 08/29/15 10:00 AM  Result Value Ref Range Status   Specimen Description BLOOD RIGHT ANTECUBITAL  Final   Special Requests IN PEDIATRIC BOTTLE  3CC  Final   Culture PENDING  Incomplete   Report Status PENDING  Incomplete   Urinalysis - Pending collection  Imaging: Dg Chest 2 View  Result Date: 08/29/2015 CLINICAL DATA:  Fever yesterday with sweats and chills. Possible graft infection. EXAM: CHEST  2 VIEW COMPARISON:  One-view chest x-ray 08/26/2014. FINDINGS: The heart size is normal. The lungs are clear. Lung volumes are somewhat low. No focal airspace disease present. Mild degenerative changes are again noted in the thoracic spine. IMPRESSION: 1. Low lung volumes. 2. No acute cardiopulmonary disease.  Electronically Signed   By: San Morelle M.D.   On: 08/29/2015 10:41   Assessment & Plan by Problem: Principal Problem:   Arteriovenous graft infection (HCC) Active Problems:   Benign essential HTN   DM (diabetes mellitus), type 2, uncontrolled, with renal complications (HCC)   ESRD (end stage renal disease) on dialysis [facility])  [name] is a 60 y.o. male with a 1 day h/o increased fatigue and feeling unwell, found to be tachycardic w/ suspected infection of his vascular access graft at his dialysis this morning. He is currently HDS stable and will be admitted to the floor for monitoring, abx therapy, and in-house dialysis.  1) Atriovenious graft infection: Pt presents with fatigue, subjective fevers, erythema of graft site, tachycardia, and leukocytosis with concern for vascular graft site infection. Started on Abx in ED. Plan for monitoring and in-house dialysis by nephrology w/ vascular surg to evaluate graft. - trend CBC in AM - UE [place] tomorrow - continue vanc/ceftazidime for empiric broad-spectrum coverage - follow up BCx/UCx, narrow/stop Abx as necessary - Vasc Surg c/s, appreciate recs  2) ESRD on hemodialysis: Pt with currently in transition from T/Th/S dialysis to M/W/F dialysis at new center. He received full session yesterday (08/28/15), but did not begin his session today (08/29/15) d/t concern for infection. - Nephrology c/s, appreciate recs  - plan dialysis in-house tomorrow - trend BMP in AM  3) HTN: Well controlled on home meds lisinopril 20mg  qD, carvedilol 12.5mg  BID. Transient episode of symptomatic hypotension to 95/51 w/ tachycardia in low 100s in the ED this morning (08/29/15). Received 500cc bolus w/ improvement. Pt is currently feeling well w/ normal BPs - hold home meds - monitor for symptoms of low BP  4) DM: Last A1c 7.6, controlled with diet and glimepiride 2mg  qD. Episode of hypoglycemia in ED this morning w/ BG 53 in the setting not having  eaten. - hold glimepiride - SSI-s    DVT PPx - heparin 5000U SQ  Code Status - Full Code  Consults Placed - Nephrology / Vascular Surgery  Dispo: Admit patient to Observation with expected length of stay not more than 2 midnights.  Signed: Holley Raring, MD 08/29/2015, 2:40 PM  Pager: [phone]

## 2015-08-29 NOTE — Progress Notes (Addendum)
I was called this AM as patient presented to HD with fevers, chills and a warm tender access arm told to come to ER.    History of note, AVG placed August 2016- Dr. Oneida Alar-  used for first time September 2016  Clotted in Jan 2017- underwent declot/angioplasty  at [facility]  Follow up [place] in February again required angioplasty at [facility]  April 2017 clotted - angioplasty and Vioban stent at [facility]  This week again clotted - another stent placed per Dr. Augustin Coupe which ends at the cubital fossa level  I told Dr. Augustin Coupe of patients presentation and he felt pt may have had subclinical infection which caused the clotting this week and was concerned that graft may now need removal due to infection.  Vascular surgery has been called to consult- and I will defer to them-  I just wanted them to have this history.  They can feel free to discuss with Dr. Augustin Coupe as well [phone] if they need additional info.  Arm today is warm and tender but no obvious pus or drainage  We will arrange for HD for this patient tomorrow in house.  To be admitted by hospitalists and start on IV abx  Majed Pellegrin A

## 2015-08-29 NOTE — ED Notes (Signed)
Meal tray ordered 

## 2015-08-29 NOTE — Progress Notes (Signed)
Pharmacy Antibiotic Note  Kyle [name] is a 60 y.o. male admitted on 08/29/2015 with bacteremia.  Pharmacy has been consulted for vancomycin and ceftazidime dosing. Pt is afebrile and WBC is elevated at 15.7. Lactic acid is 2.01. Pt had dialysis yesterday and was supposed to get it again today but did not due to fever. MD gave cefepime 2gm and vancomycin 1gm in the ED so far.   Plan: - Give additional vancomycin 750mg  IV x 1 now to make a total loading dose of 1750mg  - Vancomycin 1gm post-HD  - Ceftazidime 2gm QHD - will not give a dose today since pt received cefepime and these have similar antimicrobial spectrums - F/u renal plans, C&S, clinical status and pre-HD level when appropriate  Weight: 178 lb 6.4 oz (80.9 kg)  Temp (24hrs), Avg:99 F (37.2 C), Min:98.6 F (37 C), Max:99.4 F (37.4 C)   Recent Labs Lab 08/29/15 1010 08/29/15 1015 08/29/15 1321  WBC 15.7*  --   --   CREATININE 9.49*  --   --   LATICACIDVEN  --  1.79 2.01*    Estimated Creatinine Clearance: 9.1 mL/min (by C-G formula based on SCr of 9.49 mg/dL).    No Known Allergies  Antimicrobials this admission: Vanc 8/4>> Ceftazidime 8/4>> Cefepime x 1 7/4  Dose adjustments this admission: N/A  Microbiology results: Pending  Thank you for allowing pharmacy to be a part of this patient's care.  Rikia Sukhu, Rande Lawman 08/29/2015 3:08 PM

## 2015-08-29 NOTE — Consult Note (Signed)
Vascular and Vein Specialist of [facility]  Patient name: Kyle [name] MRN: [id number] DOB: November 01, 1955 Sex: male  REASON FOR CONSULT: Possible infected left forearm AV graft.  HPI: Kyle [name] is a 60 y.o. male, who hhad a left forearm AV graft placed with a 6 mm PTFE graft by Dr. Oneida Alar on 09/11/2014. According to the patient, this has been intervened on 3 times. Most recently, 2 days ago, he had thrombolysis for an occluded left forearm AV graft. A covered stent was placed in the outflow vein. I do not have these records as this was done at [facility], however, I did speak with Dr. Johnnette Barrios and reviewed the case. He's had 2 previous stents in the left forearm graft.  He had a fever 201 and was noted to be slightly tachycardic and for this reason he was sent to the emergency department. Is only complaint has been some increased fatigue after dialysis. He denies fever or chills.  He has been admitted and blood cultures were obtained in the emergency department. He was started on intravenous antibiotics (vancomycin and ceftazidime). GIVEN THE CONCERN FOR A INFECTED GRAFT, VASCULAR SURGERY WAS CONSULT.  Past Medical History:  Diagnosis Date  . CAD (coronary artery disease)   . Cardiac arrest ([place])    07/2014-Arctic Sun initiated  . Diabetes mellitus without complication ([place])   . ESRD (end stage renal disease) ([place])   . Hypertension     Family History  Problem Relation Age of Onset  . Heart failure Mother   . Diabetes Mother   . Hypertension Mother   . Diabetes Father     SOCIAL HISTORY: Social History   Social History  . Marital status: Married    Spouse name: N/A  . Number of children: N/A  . Years of education: N/A   Occupational History  . Not on file.   Social History Main Topics  . Smoking status: Former Smoker    Types: Cigarettes  . Smokeless tobacco: Never Used  . Alcohol use No  . Drug use: No  . Sexual activity: Not on file   Other Topics  Concern  . Not on file   Social History Narrative  . No narrative on file    No Known Allergies  Current Facility-Administered Medications  Medication Dose Route Frequency Provider Last Rate Last Dose  . acetaminophen (TYLENOL) tablet 650 mg  650 mg Oral Q6H PRN Norman Herrlich, MD       Or  . acetaminophen (TYLENOL) suppository 650 mg  650 mg Rectal Q6H PRN Norman Herrlich, MD      . atorvastatin (LIPITOR) tablet 40 mg  40 mg Oral q1800 Norman Herrlich, MD      . Derrill Memo ON 08/30/2015] cefTAZidime (FORTAZ) 2 g in dextrose 5 % 50 mL IVPB  2 g Intravenous Q T,Th,Sa-HD [place], RPH      . [START ON 08/30/2015] doxercalciferol (HECTOROL) injection 5 mcg  5 mcg Intravenous Q T,Th,Sa-HD Corliss Parish, MD      . heparin injection 5,000 Units  5,000 Units Subcutaneous Q8H Norman Herrlich, MD      . insulin aspart (novoLOG) injection 0-5 Units  0-5 Units Subcutaneous QHS Holley Raring, MD      . insulin aspart (novoLOG) injection 0-9 Units  0-9 Units Subcutaneous TID WC Holley Raring, MD      . multivitamin (RENA-VIT) tablet 1 tablet  1 tablet Oral QHS Norman Herrlich, MD      .  ondansetron (ZOFRAN) injection 4 mg  4 mg Intravenous Q8H PRN Noemi Chapel, MD      . Derrill Memo ON 08/30/2015] vancomycin (VANCOCIN) IVPB 1000 mg/200 mL premix  1,000 mg Intravenous Q T,Th,Sa-HD Kendra P Hiatt, RPH        REVIEW OF SYSTEMS:  [X]  denotes positive finding, [ ]  denotes negative finding Cardiac  Comments:  Chest pain or chest pressure:    Shortness of breath upon exertion:    Short of breath when lying flat:    Irregular heart rhythm:        Vascular    Pain in calf, thigh, or hip brought on by ambulation:    Pain in feet at night that wakes you up from your sleep:     Blood clot in your veins:    Leg swelling:         Pulmonary    Oxygen at home:    Productive cough:     Wheezing:         Neurologic    Sudden weakness in arms or legs:     Sudden numbness in arms or legs:     Sudden onset of  difficulty speaking or slurred speech:    Temporary loss of vision in one eye:     Problems with dizziness:         Gastrointestinal    Blood in stool:     Vomited blood:         Genitourinary    Burning when urinating:     Blood in urine:        Psychiatric    Major depression:         Hematologic    Bleeding problems:    Problems with blood clotting too easily:        Skin    Rashes or ulcers:        Constitutional    Fever or chills:      PHYSICAL EXAM: Vitals:   08/29/15 1500 08/29/15 1530 08/29/15 1606 08/29/15 1743  BP: 135/69 131/74 162/86 (!) 181/87  Pulse: 95 95 106 82  Resp:   21 18  Temp:    99.1 F (37.3 C)  TempSrc:    Oral  SpO2: 100% 99% 99% 99%  Weight:        GENERAL: The patient is a well-nourished male, in no acute distress. The vital signs are documented above. CARDIAC: There is a regular rate and rhythm.  VASCULAR: his left forearm graft has a good bruit. The graft is slightly pulsatile. He has a palpable left radial pulse. PULMONARY: There is good air exchange bilaterally without wheezing or rales. ABDOMEN: Soft and non-tender with normal pitched bowel sounds.  MUSCULOSKELETAL: There are no major deformities or cyanosis. NEUROLOGIC: No focal weakness or paresthesias are detected. SKIN: There is mild erythema  Adjacent to the incision above the antecubital level where he is apparently had a previous venous anastomosis. I suspect that this is also where a covered stent was placed. PSYCHIATRIC: The patient has a normal affect.  DATA:  I discussed the results of his fistulogram from 2 days ago with Dr. Bary Leriche. He has had issues with outflow stenosis that have been addressed with a covered stent.  MEDICAL ISSUES:  POSSIBLE INFECTED LEFT FOREARM AV GRAFT: This patient has mild erythema over the incision above the antecubital level but no other signs of infection.  I would agree with treating him aggressively with intravenous antibiotics to try to  salvage the graft. I'm concerned that this graft is had multiple procedures on it in the last year and that the graft is still pulsatile. This suggests some outflow stenosis. In addition he apparently has 2 covered stents. I'm not sure that this graft could be salvaged if if the area of concern required exploration. If the erythema does not respond to intravenous antibiotics then we could explore this area but this would likely require sacrificing the graft and placing a tunneled dialysis catheter 48 hours later until new access could be placed. I will follow.   [name] [facility] [place] [phone]

## 2015-08-29 NOTE — ED Notes (Signed)
Report given to [facility].  EMT will transport patient on telemetry to [place].  Patient is stable in no pain at this time

## 2015-08-29 NOTE — ED Notes (Signed)
Pt Kyle [name] at bedside and aware of CBG result 53.  Pt given orange juice and sandwich.

## 2015-08-29 NOTE — ED Provider Notes (Signed)
[facility] DEPT Provider Note   CSN: [id number] Arrival date & time: 08/29/15  [id number]  First Provider Contact:  None       History   Chief Complaint Chief Complaint  Patient presents with  . Fever    HPI Kyle [name] is a 60 y.o. male.  The patient is a 60 year old male, he has on dialysis for the last year, he has a graft in his left upper extremity in the forearm, he had to be declotted on Wednesday, he had dialysis yesterday and went again for dialysis today to help open up a new dialysis Center. On arrival to the Center they found the patient have some erythema over his graft, he had a low-grade fever and a tachycardia. Additionally the patient had an episode of vomiting this morning. He denies chest pain, cough, abdominal pain, shortness of breath, swelling of the legs, sore throat. He did not have anything to eat today, he is a diabetic who uses oral  anti-hyperglycemics.  He states that he did notice some redness this morning but did have a subjective fever yesterday, the symptoms are persistent, moderate, nothing makes them better or worse       Past Medical History:  Diagnosis Date  . CAD (coronary artery disease)   . Cardiac arrest ([place])    07/2014-[place] initiated  . Diabetes mellitus without complication ([place])   . ESRD (end stage renal disease) ([place])   . Hypertension     Patient Active Problem List   Diagnosis Date Noted  . Bacteremia 08/29/2015  . Hyperlipidemia 01/01/2015  . Thyroid nodule 01/01/2015  . Secondary cardiomyopathy ([place])   . Coronary artery disease involving native coronary artery of native heart without angina pectoris   . ESRD (end stage renal disease) on dialysis ([place])   . NSVT (nonsustained ventricular tachycardia) ([place]) 09/02/2014  . Acute respiratory failure with hypoxia ([place])   . Pressure ulcer 08/22/2014  . Ascites   . Cardiac arrest ([place])   . DM (diabetes mellitus), type 2, uncontrolled, with renal complications ([place])  [id number]  . Troponin level elevated 08/20/2014  . Anemia of chronic disease 08/20/2014  . Anasarca 08/20/2014  . Cardiorenal syndrome with renal failure   . Benign essential HTN   . Acute on chronic combined systolic and diastolic CHF, NYHA class 2 ([place]) 01/15/2014    Past Surgical History:  Procedure Laterality Date  . AV FISTULA PLACEMENT Left 09/11/2014   Procedure: INSERTION OF LEFT ARM 6MM ARTERIOVENOUS GORTEX GRAFT ;  Surgeon: Elam Dutch, MD;  Location: [facility];  Service: Vascular;  Laterality: Left;  . CARDIAC CATHETERIZATION N/A 09/04/2014   Procedure: Right/Left Heart Cath and Coronary Angiography;  Surgeon: Peter M Martinique, MD;  Location: [facility];  Service: Cardiovascular;  Laterality: N/A;  . EXTERNAL EAR SURGERY    . INSERTION OF DIALYSIS CATHETER N/A 08/21/2014   Procedure: INSERTION OF DIALYSIS CATHETER RIGHT INTERNAL JUGULAR VEIN;  Surgeon: Conrad Palominas, MD;  Location: [facility];  Service: Vascular;  Laterality: N/A;  MAC to [facility] Medications    Prior to Admission medications   Medication Sig Start Date End Date Taking? Authorizing Provider  aspirin 81 MG tablet Take 81 mg by mouth daily.   Yes Historical Provider, MD  atorvastatin (LIPITOR) 40 MG tablet Take 1 tablet (40 mg total) by mouth daily. 01/01/15  Yes Lelon Perla, MD  B Complex-C-Folic Acid (RENA-VITE  RX) 1 MG TABS Take 1 tablet by mouth daily. 05/29/15  Yes Historical Provider, MD  carvedilol (COREG) 12.5 MG tablet Take 1 tablet (12.5 mg total) by mouth 2 (two) times daily with a meal. 04/09/15  Yes Lelon Perla, MD  glimepiride (AMARYL) 2 MG tablet Take 1 tablet (2 mg total) by mouth daily with breakfast. 01/17/14  Yes Barton Dubois, MD  lisinopril (PRINIVIL,ZESTRIL) 20 MG tablet Take 20 mg by mouth daily.  03/31/15  Yes Historical Provider, MD    Family History Family History  Problem Relation Age of Onset  . Heart failure Mother   . Diabetes Mother   .  Hypertension Mother   . Diabetes Father     Social History Social History  Substance Use Topics  . Smoking status: Former Smoker    Types: Cigarettes  . Smokeless tobacco: Never Used  . Alcohol use No     Allergies   Review of patient's allergies indicates no known allergies.   Review of Systems Review of Systems  All other systems reviewed and are negative.    Physical Exam Updated Vital Signs BP 103/57   Pulse 97   Temp 99.4 F (37.4 C) (Oral)   Resp 18   Wt 178 lb 6.4 oz (80.9 kg)   SpO2 98%   BMI 24.20 kg/m   Physical Exam  Constitutional: He appears well-developed and well-nourished. No distress.  HENT:  Head: Normocephalic and atraumatic.  Mouth/Throat: Oropharynx is clear and moist. No oropharyngeal exudate.  Eyes: Conjunctivae and EOM are normal. Pupils are equal, round, and reactive to light. Right eye exhibits no discharge. Left eye exhibits no discharge. No scleral icterus.  Neck: Normal range of motion. Neck supple. No JVD present. No thyromegaly present.  Cardiovascular: Regular rhythm, normal heart sounds and intact distal pulses.  Exam reveals no gallop and no friction rub.   No murmur heard. Tachycardic, graft is supple, soft, good thrill but there is overlying redness of the skin  Pulmonary/Chest: Effort normal and breath sounds normal. No respiratory distress. He has no wheezes. He has no rales.  Abdominal: Soft. Bowel sounds are normal. He exhibits no distension and no mass. There is no tenderness.  Musculoskeletal: Normal range of motion. He exhibits no edema or tenderness.  Lymphadenopathy:    He has no cervical adenopathy.  Neurological: He is alert. Coordination normal.  Skin: Skin is warm and dry. No rash noted. There is erythema.  Psychiatric: He has a normal mood and affect. His behavior is normal.  Nursing note and vitals reviewed.    ED Treatments / Results  Labs (all labs ordered are listed, but only abnormal results are  displayed) Labs Reviewed  COMPREHENSIVE METABOLIC PANEL - Abnormal; Notable for the following:       Result Value   Potassium 3.4 (*)    Chloride 95 (*)    Glucose, Bld 62 (*)    BUN 33 (*)    Creatinine, Ser 9.49 (*)    Calcium 8.6 (*)    GFR calc non Af Amer 5 (*)    GFR calc Af Amer 6 (*)    All other components within normal limits  CBC WITH DIFFERENTIAL/PLATELET - Abnormal; Notable for the following:    WBC 15.7 (*)    RBC 3.67 (*)    Hemoglobin 11.4 (*)    HCT 34.7 (*)    Neutro Abs 14.0 (*)    All other components within normal limits  CBG MONITORING, ED -  Abnormal; Notable for the following:    Glucose-Capillary 53 (*)    All other components within normal limits  CBG MONITORING, ED - Abnormal; Notable for the following:    Glucose-Capillary 103 (*)    All other components within normal limits  CULTURE, BLOOD (ROUTINE X 2)  CULTURE, BLOOD (ROUTINE X 2)  URINE CULTURE  URINALYSIS, ROUTINE W REFLEX MICROSCOPIC (NOT AT [facility])  I-STAT CG4 LACTIC ACID, ED  CBG MONITORING, ED  I-STAT CG4 LACTIC ACID, ED    EKG  EKG Interpretation None       Radiology Dg Chest 2 View  Result Date: 08/29/2015 CLINICAL DATA:  Fever yesterday with sweats and chills. Possible graft infection. EXAM: CHEST  2 VIEW COMPARISON:  One-view chest x-ray 08/26/2014. FINDINGS: The heart size is normal. The lungs are clear. Lung volumes are somewhat low. No focal airspace disease present. Mild degenerative changes are again noted in the thoracic spine. IMPRESSION: 1. Low lung volumes. 2. No acute cardiopulmonary disease. Electronically Signed   By: San Morelle M.D.   On: 08/29/2015 10:41    Procedures Procedures (including critical care time)  Medications Ordered in ED Medications  vancomycin (VANCOCIN) IVPB 1000 mg/200 mL premix (1,000 mg Intravenous New Bag/Given 08/29/15 1214)  ceFEPIme (MAXIPIME) 2 g in dextrose 5 % 50 mL IVPB (0 g Intravenous Stopped 08/29/15 1214)  sodium chloride  0.9 % bolus 500 mL (0 mLs Intravenous Stopped 08/29/15 1214)     Initial Impression / Assessment and Plan / ED Course  I have reviewed the triage vital signs and the nursing notes.  Pertinent labs & imaging results that were available during my care of the patient were reviewed by me and considered in my medical decision making (see chart for details).  Clinical Course  Comment By Time  Dr. Moshe Cipro has seen pt as well and agrees with admission for IV abx and observation to make sure it isn't worsening  Noemi Chapel, MD 08/04 1047  The pt has developed a hypotension and has ongoing tachycardia - standing for the xray - the pt became light headed - the pt will be given 500cc of fluids - possible sepsis / likely bacteremia Noemi Chapel, MD 08/04 1048    The patient appears to have a significant infection, I suspect that he is bacteremic given the appearance of his graft with his fever, tachycardia and vomiting this morning. He is not hypotensive, he is hypoglycemic with a blood sugar of 53 and has been given food. He has not had anything to eat or drink this morning. He is mentating appropriately, he is not diaphoretic. I suspect he will need admission to the hospital. Antibiotics given for catheter related infections. The patient has dialysis, we will not wait for urine sample as I suspect this would delay antibiotic therapy in someone who does not make much if any urine  The workup has been completed, there is no leukocytosis, tachycardia is improving, 500 mL bolus of fluid given. Discussed with the vascular surgeon, Dr. Scot Dock who will see the patient in consultation as well as with the internal medicine resident who will admit the patient to the hospital.  Final Clinical Impressions(s) / ED Diagnoses   Final diagnoses:  Bacteremia    New Prescriptions New Prescriptions   No medications on file     Noemi Chapel, MD 08/29/15 1253

## 2015-08-29 NOTE — ED Notes (Signed)
Patient unable to void at this time , pt is aware we need a ua.

## 2015-08-29 NOTE — ED Triage Notes (Addendum)
Pt from home with c/o fever yesterday with sweating and chills off and on since.  Pt reports he had his graft declotted Wed and full dialysis yesterday.  Was to have dialysis today and the staff would nto do it due to having a high HR and HTN today.  Diaphoretic in triage.  NAD, A&O.

## 2015-08-30 ENCOUNTER — Observation Stay (HOSPITAL_BASED_OUTPATIENT_CLINIC_OR_DEPARTMENT_OTHER): Payer: BLUE CROSS/BLUE SHIELD

## 2015-08-30 ENCOUNTER — Encounter (HOSPITAL_COMMUNITY): Payer: Self-pay | Admitting: Internal Medicine

## 2015-08-30 DIAGNOSIS — Y712 Prosthetic and other implants, materials and accessory cardiovascular devices associated with adverse incidents: Secondary | ICD-10-CM

## 2015-08-30 DIAGNOSIS — Z992 Dependence on renal dialysis: Secondary | ICD-10-CM | POA: Diagnosis not present

## 2015-08-30 DIAGNOSIS — I120 Hypertensive chronic kidney disease with stage 5 chronic kidney disease or end stage renal disease: Secondary | ICD-10-CM | POA: Diagnosis present

## 2015-08-30 DIAGNOSIS — R7881 Bacteremia: Secondary | ICD-10-CM | POA: Diagnosis present

## 2015-08-30 DIAGNOSIS — E785 Hyperlipidemia, unspecified: Secondary | ICD-10-CM | POA: Diagnosis present

## 2015-08-30 DIAGNOSIS — N186 End stage renal disease: Secondary | ICD-10-CM

## 2015-08-30 DIAGNOSIS — T827XXD Infection and inflammatory reaction due to other cardiac and vascular devices, implants and grafts, subsequent encounter: Secondary | ICD-10-CM | POA: Diagnosis not present

## 2015-08-30 DIAGNOSIS — D631 Anemia in chronic kidney disease: Secondary | ICD-10-CM

## 2015-08-30 DIAGNOSIS — T82898A Other specified complication of vascular prosthetic devices, implants and grafts, initial encounter: Secondary | ICD-10-CM | POA: Diagnosis not present

## 2015-08-30 DIAGNOSIS — Z8249 Family history of ischemic heart disease and other diseases of the circulatory system: Secondary | ICD-10-CM | POA: Diagnosis not present

## 2015-08-30 DIAGNOSIS — Z8674 Personal history of sudden cardiac arrest: Secondary | ICD-10-CM | POA: Diagnosis not present

## 2015-08-30 DIAGNOSIS — E1122 Type 2 diabetes mellitus with diabetic chronic kidney disease: Secondary | ICD-10-CM | POA: Diagnosis present

## 2015-08-30 DIAGNOSIS — R000 Tachycardia, unspecified: Secondary | ICD-10-CM | POA: Diagnosis present

## 2015-08-30 DIAGNOSIS — Z79899 Other long term (current) drug therapy: Secondary | ICD-10-CM | POA: Diagnosis not present

## 2015-08-30 DIAGNOSIS — B9561 Methicillin susceptible Staphylococcus aureus infection as the cause of diseases classified elsewhere: Secondary | ICD-10-CM | POA: Diagnosis present

## 2015-08-30 DIAGNOSIS — I2510 Atherosclerotic heart disease of native coronary artery without angina pectoris: Secondary | ICD-10-CM | POA: Diagnosis present

## 2015-08-30 DIAGNOSIS — E11649 Type 2 diabetes mellitus with hypoglycemia without coma: Secondary | ICD-10-CM | POA: Diagnosis present

## 2015-08-30 DIAGNOSIS — T827XXA Infection and inflammatory reaction due to other cardiac and vascular devices, implants and grafts, initial encounter: Secondary | ICD-10-CM | POA: Diagnosis present

## 2015-08-30 DIAGNOSIS — Z833 Family history of diabetes mellitus: Secondary | ICD-10-CM | POA: Diagnosis not present

## 2015-08-30 DIAGNOSIS — N250 Renal osteodystrophy: Secondary | ICD-10-CM

## 2015-08-30 DIAGNOSIS — Z7982 Long term (current) use of aspirin: Secondary | ICD-10-CM | POA: Diagnosis not present

## 2015-08-30 DIAGNOSIS — Y832 Surgical operation with anastomosis, bypass or graft as the cause of abnormal reaction of the patient, or of later complication, without mention of misadventure at the time of the procedure: Secondary | ICD-10-CM | POA: Diagnosis present

## 2015-08-30 DIAGNOSIS — E1165 Type 2 diabetes mellitus with hyperglycemia: Secondary | ICD-10-CM | POA: Diagnosis present

## 2015-08-30 DIAGNOSIS — Z87891 Personal history of nicotine dependence: Secondary | ICD-10-CM | POA: Diagnosis not present

## 2015-08-30 LAB — BLOOD CULTURE ID PANEL (REFLEXED)
ACINETOBACTER BAUMANNII: NOT DETECTED
CANDIDA KRUSEI: NOT DETECTED
CARBAPENEM RESISTANCE: NOT DETECTED
Candida albicans: NOT DETECTED
Candida glabrata: NOT DETECTED
Candida parapsilosis: NOT DETECTED
Candida tropicalis: NOT DETECTED
ENTEROBACTER CLOACAE COMPLEX: NOT DETECTED
ENTEROBACTERIACEAE SPECIES: NOT DETECTED
Enterococcus species: NOT DETECTED
Escherichia coli: NOT DETECTED
Haemophilus influenzae: NOT DETECTED
KLEBSIELLA OXYTOCA: NOT DETECTED
Klebsiella pneumoniae: NOT DETECTED
Listeria monocytogenes: NOT DETECTED
Methicillin resistance: NOT DETECTED
NEISSERIA MENINGITIDIS: NOT DETECTED
PSEUDOMONAS AERUGINOSA: NOT DETECTED
Proteus species: NOT DETECTED
STAPHYLOCOCCUS AUREUS BCID: DETECTED — AB
STAPHYLOCOCCUS SPECIES: DETECTED — AB
STREPTOCOCCUS AGALACTIAE: NOT DETECTED
STREPTOCOCCUS SPECIES: NOT DETECTED
Serratia marcescens: NOT DETECTED
Streptococcus pneumoniae: NOT DETECTED
Streptococcus pyogenes: NOT DETECTED
VANCOMYCIN RESISTANCE: NOT DETECTED

## 2015-08-30 LAB — URINE CULTURE: CULTURE: NO GROWTH

## 2015-08-30 LAB — GLUCOSE, CAPILLARY
GLUCOSE-CAPILLARY: 173 mg/dL — AB (ref 65–99)
Glucose-Capillary: 76 mg/dL (ref 65–99)
Glucose-Capillary: 100 mg/dL — ABNORMAL HIGH (ref 65–99)
Glucose-Capillary: 85 mg/dL (ref 65–99)

## 2015-08-30 LAB — CBC
HCT: 32.4 % — ABNORMAL LOW (ref 39.0–52.0)
Hemoglobin: 10.6 g/dL — ABNORMAL LOW (ref 13.0–17.0)
MCH: 30.7 pg (ref 26.0–34.0)
MCHC: 32.7 g/dL (ref 30.0–36.0)
MCV: 93.9 fL (ref 78.0–100.0)
Platelets: 152 10*3/uL (ref 150–400)
RBC: 3.45 MIL/uL — ABNORMAL LOW (ref 4.22–5.81)
RDW: 15.1 % (ref 11.5–15.5)
WBC: 15.5 10*3/uL — ABNORMAL HIGH (ref 4.0–10.5)

## 2015-08-30 LAB — ECHOCARDIOGRAM COMPLETE: Weight: 2861.39 oz

## 2015-08-30 LAB — RENAL FUNCTION PANEL
ALBUMIN: 2.9 g/dL — AB (ref 3.5–5.0)
Anion gap: 16 — ABNORMAL HIGH (ref 5–15)
BUN: 50 mg/dL — AB (ref 6–20)
CO2: 27 mmol/L (ref 22–32)
CREATININE: 10.96 mg/dL — AB (ref 0.61–1.24)
Calcium: 8.5 mg/dL — ABNORMAL LOW (ref 8.9–10.3)
Chloride: 93 mmol/L — ABNORMAL LOW (ref 101–111)
GFR, EST AFRICAN AMERICAN: 5 mL/min — AB (ref >60)
GFR, EST NON AFRICAN AMERICAN: 4 mL/min — AB (ref >60)
Glucose, Bld: 65 mg/dL (ref 65–99)
PHOSPHORUS: 3.6 mg/dL (ref 2.5–4.6)
Potassium: 4.3 mmol/L (ref 3.5–5.1)
Sodium: 136 mmol/L (ref 135–145)

## 2015-08-30 LAB — MRSA PCR SCREENING: MRSA by PCR: NEGATIVE

## 2015-08-30 MED ORDER — CEFAZOLIN SODIUM-DEXTROSE 2-4 GM/100ML-% IV SOLN
2.0000 g | INTRAVENOUS | Status: DC
  Filled 2015-08-30: qty 100

## 2015-08-30 MED ORDER — CEFAZOLIN IN D5W 1 GM/50ML IV SOLN
1.0000 g | INTRAVENOUS | Status: DC
  Filled 2015-08-30: qty 50

## 2015-08-30 NOTE — Progress Notes (Signed)
Echocardiogram 2D Echocardiogram has been performed.  Kyle [name] 08/30/2015, 3:24 PM

## 2015-08-30 NOTE — Progress Notes (Addendum)
*  PRELIMINARY RESULTS* Vascular Ultrasound Duplex Dialysis Access (AVF, AGV) has been completed.  The left AV graft was visualized and found to be patent. There is evidence of a heterogenous area surrounding the graft in the antecubital region; suggestive of possible inflammation. There is evidence of homogenous area within the graft in the antecubital area suggestive of possible thrombus.  08/30/2015 10:36 AM Maudry Mayhew, B.S., RVT, RDCS, RDMS

## 2015-08-30 NOTE — Progress Notes (Signed)
   VASCULAR SURGERY ASSESSMENT & PLAN  Erythema left arm has improved significantly.  ID: blood cultures have GPC in clusters. On IV Fortaz and Vanco.   Would favor trying IV antibiotics and avoid removal of the graft. Given that he has had multiple stents in the left AVG, I do not think that I would be able to salvage the graft if he ultimately required exploration.   SUBJECTIVE: No complaints. 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   PHYSICAL EXAM: Vitals:   08/29/15 1743 08/29/15 2200 08/30/15 0208 08/30/15 0542  BP: (!) 181/87 (!) 153/68 (!) 121/44 122/62  Pulse: 82 (!) 122 84 94  Resp: 18 20 18 16   Temp: 99.1 F (37.3 C) (!) 102.8 F (39.3 C) 98.2 F (36.8 C) 99.2 F (37.3 C)  TempSrc: Oral Oral Oral Oral  SpO2: 99% 97% 95% 99%  Weight:  178 lb 9.2 oz (81 kg)  178 lb 13.4 oz (81.1 kg)   Erythema above antecubital fossa has improved significantly. Good thrill in left AVG  LABS: Lab Results  Component Value Date   WBC 15.5 (H) 08/30/2015   HGB 10.6 (L) 08/30/2015   HCT 32.4 (L) 08/30/2015   MCV 93.9 08/30/2015   PLT 152 08/30/2015   Lab Results  Component Value Date   CREATININE 9.49 (H) 08/29/2015   Lab Results  Component Value Date   INR 1.14 09/04/2014   CBG (last 3)   Recent Labs  08/29/15 1622 08/29/15 2156 08/30/15 0809  GLUCAP 222* 104* 76    Principal Problem:   Arteriovenous graft infection (HCC) Active Problems:   Benign essential HTN   DM (diabetes mellitus), type 2, uncontrolled, with renal complications ([place])   ESRD (end stage renal disease) on dialysis [facility])   Gae Gallop [id number] 08/30/2015

## 2015-08-30 NOTE — Progress Notes (Signed)
PHARMACY - PHYSICIAN COMMUNICATION CRITICAL VALUE ALERT - BLOOD CULTURE IDENTIFICATION (BCID)  Results for orders placed or performed during the hospital encounter of 08/29/15  Blood Culture ID Panel (Reflexed) (Collected: 08/29/2015 10:09 AM)  Result Value Ref Range   Enterococcus species NOT DETECTED NOT DETECTED   Vancomycin resistance NOT DETECTED NOT DETECTED   Listeria monocytogenes NOT DETECTED NOT DETECTED   Staphylococcus species DETECTED (A) NOT DETECTED   Staphylococcus aureus DETECTED (A) NOT DETECTED   Methicillin resistance NOT DETECTED NOT DETECTED   Streptococcus species NOT DETECTED NOT DETECTED   Streptococcus agalactiae NOT DETECTED NOT DETECTED   Streptococcus pneumoniae NOT DETECTED NOT DETECTED   Streptococcus pyogenes NOT DETECTED NOT DETECTED   Acinetobacter baumannii NOT DETECTED NOT DETECTED   Enterobacteriaceae species NOT DETECTED NOT DETECTED   Enterobacter cloacae complex NOT DETECTED NOT DETECTED   Escherichia coli NOT DETECTED NOT DETECTED   Klebsiella oxytoca NOT DETECTED NOT DETECTED   Klebsiella pneumoniae NOT DETECTED NOT DETECTED   Proteus species NOT DETECTED NOT DETECTED   Serratia marcescens NOT DETECTED NOT DETECTED   Carbapenem resistance NOT DETECTED NOT DETECTED   Haemophilus influenzae NOT DETECTED NOT DETECTED   Neisseria meningitidis NOT DETECTED NOT DETECTED   Pseudomonas aeruginosa NOT DETECTED NOT DETECTED   Candida albicans NOT DETECTED NOT DETECTED   Candida glabrata NOT DETECTED NOT DETECTED   Candida krusei NOT DETECTED NOT DETECTED   Candida parapsilosis NOT DETECTED NOT DETECTED   Candida tropicalis NOT DETECTED NOT DETECTED    Name of physician (or Provider) Contacted: Dr. Arcelia Jew  Changes to prescribed antibiotics required: Continue Vanco/Ceftaz for now, consider narrowing to Ancef later today after rounds   Narda Bonds 08/30/2015  3:32 AM

## 2015-08-30 NOTE — Progress Notes (Signed)
Positive blood cx for MSSA- temp overnight still with erythema- agree with taking abx down to only ancef which we can give at OP kidney center.  I think needs to be afebrile and make sure that AVG is functional (HD today )  and  will not need to be removed   Solei Wubben A

## 2015-08-30 NOTE — Progress Notes (Signed)
Pharmacy Antibiotic Note  Kyle [name] is a 60 y.o. male admitted on 08/29/2015 with MSSA bacteremia. Currently on vancomycin (got 1750mg  load on 8/4) and now pharmacy asked to change to cefazolin. He is ESRD on HD TTS with plan for HD today.   Plan: 1) Cefazolin 2g IV qHD 2) Follow up repeat blood cultures  Weight: 178 lb 13.4 oz (81.1 kg)  Temp (24hrs), Avg:99.7 F (37.6 C), Min:98.2 F (36.8 C), Max:102.8 F (39.3 C)   Recent Labs Lab 08/29/15 1010 08/29/15 1015 08/29/15 1321 08/30/15 0600  WBC 15.7*  --   --  15.5*  CREATININE 9.49*  --   --  10.96*  LATICACIDVEN  --  1.79 2.01*  --     Estimated Creatinine Clearance: 7.9 mL/min (by C-G formula based on SCr of 10.96 mg/dL).    No Known Allergies  Antimicrobials this admission: Cefazolin 8/5 >> Vancomycin 8/4>> 8/5 Ceftazidime 8/4>> 8/5 Cefepime x 1 8/4  Dose adjustments this admission: n/a  Microbiology results: Blood Cx 8/4>> 2/2 gram + cocci in clusters >> BCID for staph aureus- MSSA Urine Cx 8/4 >>  Thank you for allowing pharmacy to be a part of this patient's care.  Deboraha Sprang 08/30/2015 1:57 PM

## 2015-08-30 NOTE — Consult Note (Signed)
[facility]    Date of Admission:  08/29/2015           Day 2 vancomycin       Reason for Consult: Automatic consultation for Staph aureus bacteremia     Principal Problem:   Staphylococcus aureus bacteremia Active Problems:   Arteriovenous graft infection (HCC)   Benign essential HTN   DM (diabetes mellitus), type 2, uncontrolled, with renal complications (HCC)   ESRD (end stage renal disease) on dialysis ([place])   . atorvastatin  40 mg Oral q1800  . doxercalciferol  5 mcg Intravenous Q T,Th,Sa-HD  . heparin  5,000 Units Subcutaneous Q8H  . insulin aspart  0-5 Units Subcutaneous QHS  . insulin aspart  0-9 Units Subcutaneous TID WC  . multivitamin  1 tablet Oral QHS  . vancomycin  1,000 mg Intravenous Q T,Th,Sa-HD    Recommendations: 1. Change vancomycin to cefazolin 2. Repeat blood cultures 3. Transthoracic echocardiogram   Assessment: He has MSSA bacteremia, most likely from early infection over his graft. Hopefully we can cure this without graft removal. I will change vancomycin to cefazolin, repeat blood cultures and order a transthoracic echocardiogram.    HPI: Kyle [name] is a 60 y.o. male with end-stage renal disease who is dialyzed via a left arm AV graft. He required declotting and stenting of his graft on 08/27/2015. About 24 hours later he began to develop anorexia and malaise. When he went to dialysis yesterday he was noted to be tachycardic. He developed fever after going home and came to the emergency department. He was found to be febrile with temperature of 102.8. He was noted to have some cellulitis over the proximal portion of his graft. He states that he is feeling much better today. Both admission blood cultures have grown MSSA.   Review of Systems: Review of Systems  Constitutional: Positive for chills, fever and malaise/fatigue. Negative for diaphoresis and weight loss.  HENT: Negative for sore throat.     Respiratory: Negative for cough, sputum production and shortness of breath.   Cardiovascular: Negative for chest pain.  Gastrointestinal: Negative for abdominal pain, diarrhea, nausea and vomiting.  Musculoskeletal: Negative for joint pain and myalgias.  Skin: Negative for rash.  Neurological: Negative for headaches.    Past Medical History:  Diagnosis Date  . CAD (coronary artery disease)   . Cardiac arrest ([place])    07/2014-[place] initiated  . Diabetes mellitus without complication ([place])   . ESRD (end stage renal disease) ([place])   . Hypertension   . Staphylococcus aureus bacteremia 08/29/2015    Social History  Substance Use Topics  . Smoking status: Former Smoker    Types: Cigarettes  . Smokeless tobacco: Never Used  . Alcohol use No    Family History  Problem Relation Age of Onset  . Heart failure Mother   . Diabetes Mother   . Hypertension Mother   . Diabetes Father    No Known Allergies  OBJECTIVE: Blood pressure 140/69, pulse (!) 102, temperature 99.3 F (37.4 C), temperature source Oral, resp. rate 18, weight 178 lb 13.4 oz (81.1 kg), SpO2 99 %.  Physical Exam  Constitutional: He is oriented to person, place, and time.  He is calm, alert and in no distress.  HENT:  Mouth/Throat: No oropharyngeal exudate.  Eyes: Conjunctivae are normal.  Cardiovascular: Regular rhythm.   Murmur heard. He is tachycardic with a 1/6 systolic murmur.  Pulmonary/Chest: Effort normal and breath sounds normal.  Abdominal: Soft. He exhibits no mass. There is no tenderness.  Musculoskeletal: Normal range of motion. He exhibits no edema or tenderness.  Neurological: He is alert and oriented to person, place, and time.  Skin: No rash noted.  She has a good thrill in his left forearm graft. There is one area of faint erythema over the graft in the antecubital fossa. He states it is looking much better than yesterday.  Psychiatric: Mood and affect normal.    Lab Results Lab  Results  Component Value Date   WBC 15.5 (H) 08/30/2015   HGB 10.6 (L) 08/30/2015   HCT 32.4 (L) 08/30/2015   MCV 93.9 08/30/2015   PLT 152 08/30/2015    Lab Results  Component Value Date   CREATININE 10.96 (H) 08/30/2015   BUN 50 (H) 08/30/2015   NA 136 08/30/2015   K 4.3 08/30/2015   CL 93 (L) 08/30/2015   CO2 27 08/30/2015    Lab Results  Component Value Date   ALT 20 08/29/2015   AST 25 08/29/2015   ALKPHOS 65 08/29/2015   BILITOT 0.6 08/29/2015     Microbiology: Recent Results (from the past 240 hour(s))  Culture, blood (Routine x 2)     Status: None (Preliminary result)   Collection Time: 08/29/15 10:00 AM  Result Value Ref Range Status   Specimen Description BLOOD RIGHT ANTECUBITAL  Final   Special Requests IN PEDIATRIC BOTTLE  3CC  Final   Culture  Setup Time   Final    GRAM POSITIVE COCCI IN CLUSTERS AEROBIC BOTTLE ONLY CRITICAL VALUE NOTED.  VALUE IS CONSISTENT WITH PREVIOUSLY REPORTED AND CALLED VALUE.    Culture GRAM POSITIVE COCCI  Final   Report Status PENDING  Incomplete  Culture, blood (Routine x 2)     Status: None (Preliminary result)   Collection Time: 08/29/15 10:09 AM  Result Value Ref Range Status   Specimen Description BLOOD RIGHT FOREARM  Final   Special Requests BOTTLES DRAWN AEROBIC AND ANAEROBIC  10CC  Final   Culture  Setup Time   Final    GRAM POSITIVE COCCI IN CLUSTERS IN BOTH AEROBIC AND ANAEROBIC BOTTLES CRITICAL RESULT CALLED TO, READ BACK BY AND VERIFIED WITHLenna Sciara [facility] PHARMD [id number] 08/30/15 A BROWNING    Culture GRAM POSITIVE COCCI  Final   Report Status PENDING  Incomplete  Blood Culture ID Panel (Reflexed)     Status: Abnormal   Collection Time: 08/29/15 10:09 AM  Result Value Ref Range Status   Enterococcus species NOT DETECTED NOT DETECTED Final   Vancomycin resistance NOT DETECTED NOT DETECTED Final   Listeria monocytogenes NOT DETECTED NOT DETECTED Final   Staphylococcus species DETECTED (A) NOT DETECTED Final    Comment:  CRITICAL RESULT CALLED TO, READ BACK BY AND VERIFIED WITH: J [facility] PHARMD [id number] 08/30/15 A BROWNING    Staphylococcus aureus DETECTED (A) NOT DETECTED Final    Comment: CRITICAL RESULT CALLED TO, READ BACK BY AND VERIFIED WITHLenna Sciara [facility] PHARMD [id number] 08/30/15 A BROWNING    Methicillin resistance NOT DETECTED NOT DETECTED Final   Streptococcus species NOT DETECTED NOT DETECTED Final   Streptococcus agalactiae NOT DETECTED NOT DETECTED Final   Streptococcus pneumoniae NOT DETECTED NOT DETECTED Final   Streptococcus pyogenes NOT DETECTED NOT DETECTED Final   Acinetobacter baumannii NOT DETECTED NOT DETECTED Final   Enterobacteriaceae species NOT DETECTED NOT DETECTED Final   Enterobacter cloacae complex NOT DETECTED NOT DETECTED Final  Escherichia coli NOT DETECTED NOT DETECTED Final   Klebsiella oxytoca NOT DETECTED NOT DETECTED Final   Klebsiella pneumoniae NOT DETECTED NOT DETECTED Final   Proteus species NOT DETECTED NOT DETECTED Final   Serratia marcescens NOT DETECTED NOT DETECTED Final   Carbapenem resistance NOT DETECTED NOT DETECTED Final   Haemophilus influenzae NOT DETECTED NOT DETECTED Final   Neisseria meningitidis NOT DETECTED NOT DETECTED Final   Pseudomonas aeruginosa NOT DETECTED NOT DETECTED Final   Candida albicans NOT DETECTED NOT DETECTED Final   Candida glabrata NOT DETECTED NOT DETECTED Final   Candida krusei NOT DETECTED NOT DETECTED Final   Candida parapsilosis NOT DETECTED NOT DETECTED Final   Candida tropicalis NOT DETECTED NOT DETECTED Final  MRSA PCR Screening     Status: None   Collection Time: 08/29/15  8:22 PM  Result Value Ref Range Status   MRSA by PCR NEGATIVE NEGATIVE Final    Comment:        The GeneXpert MRSA Assay (FDA approved for NASAL specimens only), is one component of a comprehensive MRSA colonization surveillance program. It is not intended to diagnose MRSA infection nor to guide or monitor treatment for MRSA infections.     Michel Bickers, MD [facility] [phone] pager   [phone] cell 08/30/2015, 1:43 PM

## 2015-08-30 NOTE — Progress Notes (Signed)
Subjective: Currently, the patient is feeling well. He has no acute complaints. He is tolerating his diet well.  Interval Events: GPC's growing in blood 2/2. Afebrile ON.  Objective: Vital signs in last 24 hours: Vitals:   08/29/15 2200 08/30/15 0208 08/30/15 0542 08/30/15 1003  BP: (!) 153/68 (!) 121/44 122/62 140/69  Pulse: (!) 122 84 94 (!) 102  Resp: 20 18 16 18   Temp: (!) 102.8 F (39.3 C) 98.2 F (36.8 C) 99.2 F (37.3 C) 99.3 F (37.4 C)  TempSrc: Oral Oral Oral Oral  SpO2: 97% 95% 99% 99%  Weight: 178 lb 9.2 oz (81 kg)  178 lb 13.4 oz (81.1 kg)    Physical Exam: Physical Exam  Constitutional: No distress.  Cardiovascular: Normal rate, regular rhythm and normal heart sounds.   Pulmonary/Chest: Effort normal and breath sounds normal.  Abdominal: Soft. Bowel sounds are normal. There is no tenderness.  Musculoskeletal: He exhibits no edema.  Pt has dialysis vascular access graft below the ante-cubital fossa. There is a palpable thrill throughout, good radial pulse, and the graft is soft and non-tender. Light erythema over the proximal graft near the antecubital fossa, without significant warmth. Several well healing wounds from recent clot removal procedures.  Skin: Skin is warm. He is not diaphoretic.   Labs: CBC:  Recent Labs Lab 08/29/15 1010 08/30/15 0600  WBC 15.7* 15.5*  NEUTROABS 14.0*  --   HGB 11.4* 10.6*  HCT 34.7* 32.4*  MCV 94.6 93.9  PLT [id number]   Metabolic Panel:  Recent Labs Lab 08/29/15 1010 08/30/15 0600  NA 135 136  K 3.4* 4.3  CL 95* 93*  CO2 26 27  GLUCOSE 62* 65  BUN 33* 50*  CREATININE 9.49* 10.96*  CALCIUM 8.6* 8.5*  PHOS  --  3.6  ALT 20  --   ALKPHOS 65  --   BILITOT 0.6  --   PROT 7.8  --   ALBUMIN 3.6 2.9*   Cardiac Labs: No results for input(s): CKTOTAL, CKMB, CKMBINDEX, TROPIPOC, TROPONINI, BNP in the last 168 hours. BG:  Recent Labs Lab 08/29/15 1049 08/29/15 1622 08/29/15 2156 08/30/15 0809  08/30/15 1202  GLUCAP 103* 222* 104* 76 100*   Lab Results  Component Value Date   HGBA1C 7.6 (H) 08/20/2014   Microbiology: BCx 2/2 GPCs UCx pending   Medications:   Scheduled Medications: . atorvastatin  40 mg Oral q1800  .  ceFAZolin (ANCEF) IV  2 g Intravenous Q T,Th,Sat-1800  . doxercalciferol  5 mcg Intravenous Q T,Th,Sa-HD  . heparin  5,000 Units Subcutaneous Q8H  . insulin aspart  0-5 Units Subcutaneous QHS  . insulin aspart  0-9 Units Subcutaneous TID WC  . multivitamin  1 tablet Oral QHS   PRN Medications: acetaminophen **OR** acetaminophen  Assessment/Plan: Pt is a 60 y.o. yo male with a PMHx of ESRD on HD, multiple AV graft clots, HTN, HLD, acute HF w/ preserved EF who was admitted on 08/29/2015 with symptoms of fatigue, fever, erythema around his graft, which was determined to be secondary to graft infection and bacteremia. Interventions at this time will be focused on abx therapy.   1) Atriovenious graft infection w/ Bacteremia: Afebrile ON w/ persistent WBC to 15. Graft is non-tender, but lightly erythematous and warm. BCx w/ MSSA. - trend CBC in AM - Narrow vanc/ceftazidime to cefazolin 2g IV, can be dosed at dialysis - repeat BCx in 48hr (ordered) - Vasc Surg c/s, will defer graft exploration for now  2) ESRD on hemodialysis: Pt with currently in transition from T/Th/S dialysis to M/W/F dialysis at new center. He received full session yesterday (08/28/15), but did not begin his session today (08/29/15) d/t concern for infection. - Nephrology c/s, appreciate recs                       - dialyzed today - trend BMP in AM  3) HTN: Well controlled on home meds lisinopril 20mg  qD, carvedilol 12.5mg  BID. Transient episode of symptomatic hypotension to 95/51 w/ tachycardia in low 100s in the ED this morning (08/29/15). Received 500cc bolus w/ improvement. Pt is currently feeling well w/ normal BPs - hold home meds - monitor for symptoms of low BP  4) DM: Last A1c 7.6,  controlled with diet and glimepiride 2mg  qD. - hold glimepiride - SSI-s    Length of Stay: 0 day(s) Dispo: Anticipated discharge in approximately 1-2 day(s).  Holley Raring, MD Pager: [phone] (7AM-5PM) 08/30/2015, 2:53 PM

## 2015-08-31 ENCOUNTER — Inpatient Hospital Stay (HOSPITAL_COMMUNITY): Payer: BLUE CROSS/BLUE SHIELD

## 2015-08-31 ENCOUNTER — Inpatient Hospital Stay (HOSPITAL_COMMUNITY): Payer: BLUE CROSS/BLUE SHIELD | Admitting: Anesthesiology

## 2015-08-31 ENCOUNTER — Encounter (HOSPITAL_COMMUNITY)
Admission: EM | Disposition: A | Discharge status: Home Health | Payer: Self-pay | Source: Home / Self Care | Attending: Student in an Organized Health Care Education/Training Program

## 2015-08-31 DIAGNOSIS — Z7984 Long term (current) use of oral hypoglycemic drugs: Secondary | ICD-10-CM

## 2015-08-31 HISTORY — PX: REVISION OF ARTERIOVENOUS GORETEX GRAFT: SHX6073

## 2015-08-31 LAB — CBC
HCT: 35.1 % — ABNORMAL LOW (ref 39.0–52.0)
Hemoglobin: 11.8 g/dL — ABNORMAL LOW (ref 13.0–17.0)
MCH: 30.4 pg (ref 26.0–34.0)
MCHC: 33.6 g/dL (ref 30.0–36.0)
MCV: 90.5 fL (ref 78.0–100.0)
Platelets: 140 10*3/uL — ABNORMAL LOW (ref 150–400)
RBC: 3.88 MIL/uL — AB (ref 4.22–5.81)
RDW: 14.9 % (ref 11.5–15.5)
WBC: 7.8 10*3/uL (ref 4.0–10.5)

## 2015-08-31 LAB — BASIC METABOLIC PANEL
Anion gap: 14 (ref 5–15)
BUN: 57 mg/dL — AB (ref 6–20)
CHLORIDE: 93 mmol/L — AB (ref 101–111)
CO2: 25 mmol/L (ref 22–32)
Calcium: 8.4 mg/dL — ABNORMAL LOW (ref 8.9–10.3)
Creatinine, Ser: 12.94 mg/dL — ABNORMAL HIGH (ref 0.61–1.24)
GFR calc Af Amer: 4 mL/min — ABNORMAL LOW (ref >60)
GFR calc non Af Amer: 4 mL/min — ABNORMAL LOW (ref >60)
Glucose, Bld: 148 mg/dL — ABNORMAL HIGH (ref 65–99)
POTASSIUM: 4.5 mmol/L (ref 3.5–5.1)
SODIUM: 132 mmol/L — AB (ref 135–145)

## 2015-08-31 LAB — GLUCOSE, CAPILLARY
Glucose-Capillary: 149 mg/dL — ABNORMAL HIGH (ref 65–99)
Glucose-Capillary: 136 mg/dL — ABNORMAL HIGH (ref 65–99)
Glucose-Capillary: 194 mg/dL — ABNORMAL HIGH (ref 65–99)
Glucose-Capillary: 162 mg/dL — ABNORMAL HIGH (ref 65–99)

## 2015-08-31 SURGERY — REVISION OF ARTERIOVENOUS GORETEX GRAFT
Anesthesia: General | Laterality: Left

## 2015-08-31 MED ORDER — PHENYLEPHRINE HCL 10 MG/ML IJ SOLN
INTRAVENOUS | Status: DC | PRN
  Administered 2015-08-31: 50 ug/min via INTRAVENOUS

## 2015-08-31 MED ORDER — ONDANSETRON HCL 4 MG/2ML IJ SOLN
INTRAMUSCULAR | Status: DC | PRN
  Administered 2015-08-31: 4 mg via INTRAVENOUS

## 2015-08-31 MED ORDER — MIDAZOLAM HCL 2 MG/2ML IJ SOLN
INTRAMUSCULAR | Status: AC
  Filled 2015-08-31: qty 2

## 2015-08-31 MED ORDER — OXYCODONE-ACETAMINOPHEN 5-325 MG PO TABS
1.0000 | ORAL_TABLET | ORAL | Status: DC | PRN
  Administered 2015-08-31: 2 via ORAL
  Filled 2015-08-31 (×2): qty 2

## 2015-08-31 MED ORDER — LIDOCAINE 2% (20 MG/ML) 5 ML SYRINGE
INTRAMUSCULAR | Status: AC
  Filled 2015-08-31: qty 5

## 2015-08-31 MED ORDER — ONDANSETRON HCL 4 MG/2ML IJ SOLN
INTRAMUSCULAR | Status: AC
  Filled 2015-08-31: qty 2

## 2015-08-31 MED ORDER — PROTAMINE SULFATE 10 MG/ML IV SOLN
INTRAVENOUS | Status: AC
  Filled 2015-08-31: qty 5

## 2015-08-31 MED ORDER — HEPARIN SODIUM (PORCINE) 1000 UNIT/ML IJ SOLN
INTRAMUSCULAR | Status: AC
  Filled 2015-08-31: qty 1

## 2015-08-31 MED ORDER — FENTANYL CITRATE (PF) 100 MCG/2ML IJ SOLN: 25.0000 ug | INTRAMUSCULAR | Status: DC | PRN

## 2015-08-31 MED ORDER — PROMETHAZINE HCL 25 MG/ML IJ SOLN: 6.2500 mg | INTRAMUSCULAR | Status: DC | PRN

## 2015-08-31 MED ORDER — 0.9 % SODIUM CHLORIDE (POUR BTL) OPTIME
TOPICAL | Status: DC | PRN
  Administered 2015-08-31: 1000 mL

## 2015-08-31 MED ORDER — MORPHINE SULFATE (PF) 4 MG/ML IV SOLN: 4.0000 mg | INTRAVENOUS | Status: DC | PRN

## 2015-08-31 MED ORDER — SODIUM CHLORIDE 0.9 % IV SOLN
INTRAVENOUS | Status: DC | PRN
  Administered 2015-08-31: 08:00:00 via INTRAVENOUS

## 2015-08-31 MED ORDER — IOPAMIDOL (ISOVUE-300) INJECTION 61%
INTRAVENOUS | Status: AC
  Filled 2015-08-31: qty 50

## 2015-08-31 MED ORDER — PROPOFOL 10 MG/ML IV BOLUS
INTRAVENOUS | Status: AC
  Filled 2015-08-31: qty 20

## 2015-08-31 MED ORDER — VANCOMYCIN HCL IN DEXTROSE 1-5 GM/200ML-% IV SOLN
INTRAVENOUS | Status: AC
  Filled 2015-08-31: qty 200

## 2015-08-31 MED ORDER — CEFAZOLIN SODIUM-DEXTROSE 2-4 GM/100ML-% IV SOLN
INTRAVENOUS | Status: AC
  Filled 2015-08-31: qty 100

## 2015-08-31 MED ORDER — FENTANYL CITRATE (PF) 250 MCG/5ML IJ SOLN
INTRAMUSCULAR | Status: DC | PRN
  Administered 2015-08-31 (×2): 50 ug via INTRAVENOUS
  Administered 2015-08-31 (×4): 25 ug via INTRAVENOUS

## 2015-08-31 MED ORDER — MIDAZOLAM HCL 2 MG/2ML IJ SOLN
INTRAMUSCULAR | Status: DC | PRN
  Administered 2015-08-31: 2 mg via INTRAVENOUS

## 2015-08-31 MED ORDER — THROMBIN 20000 UNITS EX SOLR
CUTANEOUS | Status: AC
  Filled 2015-08-31: qty 20000

## 2015-08-31 MED ORDER — PROPOFOL 10 MG/ML IV BOLUS
INTRAVENOUS | Status: DC | PRN
  Administered 2015-08-31: 100 mg via INTRAVENOUS

## 2015-08-31 MED ORDER — PROTAMINE SULFATE 10 MG/ML IV SOLN
INTRAVENOUS | Status: DC | PRN
  Administered 2015-08-31: 40 mg via INTRAVENOUS

## 2015-08-31 MED ORDER — FENTANYL CITRATE (PF) 250 MCG/5ML IJ SOLN
INTRAMUSCULAR | Status: AC
  Filled 2015-08-31: qty 5

## 2015-08-31 MED ORDER — IOPAMIDOL (ISOVUE-300) INJECTION 61%
INTRAVENOUS | Status: DC | PRN
  Administered 2015-08-31: 25 mL via INTRA_ARTERIAL

## 2015-08-31 MED ORDER — HEPARIN SODIUM (PORCINE) 1000 UNIT/ML IJ SOLN
INTRAMUSCULAR | Status: DC | PRN
  Administered 2015-08-31: 6000 [IU] via INTRAVENOUS

## 2015-08-31 MED ORDER — CEFAZOLIN SODIUM-DEXTROSE 2-4 GM/100ML-% IV SOLN
2.0000 g | INTRAVENOUS | Status: DC
  Administered 2015-08-31 – 2015-09-01 (×2): 2 g via INTRAVENOUS
  Filled 2015-08-31: qty 100

## 2015-08-31 MED ORDER — LIDOCAINE 2% (20 MG/ML) 5 ML SYRINGE
INTRAMUSCULAR | Status: DC | PRN
  Administered 2015-08-31: 60 mg via INTRAVENOUS

## 2015-08-31 MED ORDER — PHENYLEPHRINE HCL 10 MG/ML IJ SOLN
INTRAMUSCULAR | Status: DC | PRN
  Administered 2015-08-31 (×2): 80 ug via INTRAVENOUS

## 2015-08-31 MED ORDER — SODIUM CHLORIDE 0.9 % IV SOLN
INTRAVENOUS | Status: DC | PRN
  Administered 2015-08-31: 500 mL

## 2015-08-31 SURGICAL SUPPLY — 49 items
ADH SKN CLS APL DERMABOND .7 (GAUZE/BANDAGES/DRESSINGS) ×1
BANDAGE ACE 4X5 VEL STRL LF (GAUZE/BANDAGES/DRESSINGS) ×2 IMPLANT
BNDG GAUZE ELAST 4 BULKY (GAUZE/BANDAGES/DRESSINGS) ×2 IMPLANT
CANISTER SUCTION 2500CC (MISCELLANEOUS) ×3 IMPLANT
CANNULA VESSEL 3MM 2 BLNT TIP (CANNULA) ×2 IMPLANT
CATH EMB 4FR 80CM (CATHETERS) ×2 IMPLANT
CLIP TI MEDIUM 6 (CLIP) ×3 IMPLANT
CLIP TI WIDE RED SMALL 6 (CLIP) ×3 IMPLANT
DECANTER SPIKE VIAL GLASS SM (MISCELLANEOUS) ×1 IMPLANT
DERMABOND ADVANCED (GAUZE/BANDAGES/DRESSINGS) ×2
DERMABOND ADVANCED .7 DNX12 (GAUZE/BANDAGES/DRESSINGS) IMPLANT
DRAPE X-RAY CASS 24X20 (DRAPES) ×2 IMPLANT
ELECT REM PT RETURN 9FT ADLT (ELECTROSURGICAL) ×3
ELECTRODE REM PT RTRN 9FT ADLT (ELECTROSURGICAL) ×1 IMPLANT
GAUZE SPONGE 4X4 16PLY XRAY LF (GAUZE/BANDAGES/DRESSINGS) ×2 IMPLANT
GLOVE BIO SURGEON STRL SZ7.5 (GLOVE) ×3 IMPLANT
GLOVE BIOGEL PI IND STRL 6.5 (GLOVE) IMPLANT
GLOVE BIOGEL PI IND STRL 8 (GLOVE) ×1 IMPLANT
GLOVE BIOGEL PI INDICATOR 6.5 (GLOVE) ×4
GLOVE BIOGEL PI INDICATOR 8 (GLOVE) ×2
GLOVE SS BIOGEL STRL SZ 6.5 (GLOVE) IMPLANT
GLOVE SUPERSENSE BIOGEL SZ 6.5 (GLOVE) ×2
GOWN STRL REUS W/ TWL LRG LVL3 (GOWN DISPOSABLE) ×3 IMPLANT
GOWN STRL REUS W/TWL LRG LVL3 (GOWN DISPOSABLE) ×9
GRAFT GORETEX STRT 4-7X45 (Vascular Products) ×2 IMPLANT
KIT BASIN OR (CUSTOM PROCEDURE TRAY) ×3 IMPLANT
KIT ROOM TURNOVER OR (KITS) ×3 IMPLANT
LIQUID BAND (GAUZE/BANDAGES/DRESSINGS) ×3 IMPLANT
NDL HYPO 25GX1X1/2 BEV (NEEDLE) ×1 IMPLANT
NEEDLE HYPO 25GX1X1/2 BEV (NEEDLE) IMPLANT
NS IRRIG 1000ML POUR BTL (IV SOLUTION) ×3 IMPLANT
PACK CV ACCESS (CUSTOM PROCEDURE TRAY) ×3 IMPLANT
PAD ARMBOARD 7.5X6 YLW CONV (MISCELLANEOUS) ×6 IMPLANT
SET COLLECT BLD 21X3/4 12 PB (MISCELLANEOUS) ×4 IMPLANT
SPONGE GAUZE 4X4 12PLY STER LF (GAUZE/BANDAGES/DRESSINGS) ×2 IMPLANT
SPONGE LAP 18X18 X RAY DECT (DISPOSABLE) ×2 IMPLANT
SPONGE SURGIFOAM ABS GEL 100 (HEMOSTASIS) IMPLANT
STOPCOCK 4 WAY LG BORE MALE ST (IV SETS) ×2 IMPLANT
SUT PROLENE 6 0 BV (SUTURE) ×14 IMPLANT
SUT VIC AB 3-0 SH 27 (SUTURE) ×15
SUT VIC AB 3-0 SH 27X BRD (SUTURE) ×2 IMPLANT
SUT VICRYL 4-0 PS2 18IN ABS (SUTURE) ×10 IMPLANT
SWAB CULTURE ESWAB REG 1ML (MISCELLANEOUS) ×4 IMPLANT
SYR 20CC LL (SYRINGE) ×2 IMPLANT
SYR 30ML LL (SYRINGE) ×2 IMPLANT
SYR 3ML LL SCALE MARK (SYRINGE) ×2 IMPLANT
TUBING EXTENTION W/L.L. (IV SETS) ×2 IMPLANT
UNDERPAD 30X30 INCONTINENT (UNDERPADS AND DIAPERS) ×3 IMPLANT
WATER STERILE IRR 1000ML POUR (IV SOLUTION) ×3 IMPLANT

## 2015-08-31 NOTE — Op Note (Signed)
    NAME: Kyle [name]    MRN: [id number] DOB: 10-Oct-1955    DATE OF OPERATION: 08/31/2015  PREOP DIAGNOSIS: infected stent left forearm AV graft  POSTOP DIAGNOSIS: same  PROCEDURE:  1. Intraoperative fistulogram 2. Revisionn of left forearm AV graft with interposition 7 mm PTFE graft 3. Removal of infected stent left arm  SURGEON: Judeth Cornfield. Scot Dock, MD, FACS  ASSIST: none  ANESTHESIA: Gen.   EBL: minimal  INDICATIONS: Kyle [name] is a 60 y.o. male who presented with erythema over an area that had undergone previous stent graft placement in a left forearm AV graft. The patient had positive blood cultures and persistent fevers. I felt that exploration was indicated.  FINDINGS: infected stent left forearm AV graft. I bypassed around the infected segment. The graft in the forearm did not appear to be infected. Once these wounds were sealed, I then removed the infected stent. Intraoperative cultures were sent.  TECHNIQUE: The patient was taken to the operating room and received a general anesthetic. The left upper extremity was prepped and draped in the usual sterile fashion. An incision was made over the venous limb of the graft along the radial aspect of the left forearm. Here the graft was dissected free and was well incorporated. It did not appear to be infected at this level. This graft was cannulated and intraoperative fistulogram obtained to evaluate the position of the stent and also the outflow vein. The outflow vein appeared reasonable.  I therefore made a separate longitudinal incision over the brachial vein in the mid upper arm. The vein here was dissected free and controlled with a vessel loop. A tunnel was created between the 2 incisions and a new segment of 7 mm PTFE graft was tunneled between the 2 incisions.  The patient was heparinized. The graft was clamped in the forearm and divided. The new segment of graft was sewn into into the old graft using continuous 6-0  Prolene suture.The proximal nonfunctional segment was excised and in this area closed with 3-0 Vicryl to protect it from the new anastomosis.  In the upper arm, the vein was ligated distally and spatulated proximally. The new segment of graft was cut to the appropriate length, spatulated, and sewn into into the vein using continuous 6-0 Prolene suture.  At the completion was an excellent thrill in the fistula. Hemostasis was obtained in these wounds and these wounds were closed with a deep area of 3-0 Vicryl and the skin closed with 4-0 Vicryl. Liquid barium was applied and a sterile dressing was applied to isolate these incisions.  Next I went over the suspicious area where a longitudinal incision was made and purulent drainage was encountered. This was cultured. The dissection was carried down to where the graft was anastomosed to the brachial vein. The graft was removed from the brachial vein entirely. The vein was identified and the stent within the vein was identified and this was easily retrieved. It had purulent drainage around it. The wound was irrigated.  Hemostasis was obtained in the wound and the wound was then packed with a moist 4 x 4. A sterile dressing was applied. The patient tolerated the procedure well and was transferred to the recovery room in stable condition. All needle and sponge counts were correct.  [name], MD, FACS [facility]  DATE OF DICTATION:   08/31/2015

## 2015-08-31 NOTE — Anesthesia Preprocedure Evaluation (Addendum)
Anesthesia Evaluation  Patient identified by MRN, date of birth, ID band Patient awake    Reviewed: Allergy & Precautions, NPO status , Patient's Chart, lab work & pertinent test results  Airway Mallampati: II  TM Distance: >3 FB Neck ROM: Full    Dental no notable dental hx. (+) Teeth Intact, Dental Advisory Given   Pulmonary neg pulmonary ROS, former smoker,    Pulmonary exam normal breath sounds clear to auscultation       Cardiovascular hypertension, +CHF  Normal cardiovascular exam Rhythm:Regular Rate:Normal  EF 60%   Neuro/Psych negative neurological ROS  negative psych ROS   GI/Hepatic negative GI ROS, Neg liver ROS,   Endo/Other  diabetes  Renal/GU DialysisRenal disease  negative genitourinary   Musculoskeletal negative musculoskeletal ROS (+)   Abdominal   Peds negative pediatric ROS (+)  Hematology  (+) anemia ,   Anesthesia Other Findings   Reproductive/Obstetrics negative OB ROS                            Anesthesia Physical Anesthesia Plan  ASA: III  Anesthesia Plan: General   Post-op Pain Management:    Induction: Intravenous  Airway Management Planned: LMA  Additional Equipment:   Intra-op Plan:   Post-operative Plan: Extubation in OR  Informed Consent: I have reviewed the patients History and Physical, chart, labs and discussed the procedure including the risks, benefits and alternatives for the proposed anesthesia with the patient or authorized representative who has indicated his/her understanding and acceptance.   Dental advisory given  Plan Discussed with: CRNA and Surgeon  Anesthesia Plan Comments:         Anesthesia Quick Evaluation

## 2015-08-31 NOTE — Anesthesia Postprocedure Evaluation (Signed)
Anesthesia Post Note  Patient: Kyle [name]  Procedure(s) Performed: Procedure(s) (LRB): REVISION OF LEFT ARTERIOVENOUS GORETEX GRAFT USING  AN INTERPOSITION  4-7MM GORTEX GRAFT WITH REMOVAL OF INFECTED STENT;  INTRAOPERATIVE ARTERIOGRAM TIMES ONE. (Left)  Patient location during evaluation: PACU Anesthesia Type: General Level of consciousness: awake and alert Pain management: pain level controlled Vital Signs Assessment: post-procedure vital signs reviewed and stable Respiratory status: spontaneous breathing, nonlabored ventilation, respiratory function stable and patient connected to nasal cannula oxygen Cardiovascular status: blood pressure returned to baseline and stable Postop Assessment: no signs of nausea or vomiting Anesthetic complications: no    Last Vitals:  Vitals:   08/31/15 0429 08/31/15 1024  BP: 131/75 (!) 111/56  Pulse: 95 85  Resp: 18 16  Temp: 36.9 C     Last Pain:  Vitals:   08/31/15 0429  TempSrc: Oral  PainSc:                  Makilah Dowda S

## 2015-08-31 NOTE — Transfer of Care (Signed)
Immediate Anesthesia Transfer of Care Note  Patient: Kyle [name]  Procedure(s) Performed: Procedure(s): REVISION OF LEFT ARTERIOVENOUS GORETEX GRAFT USING  AN INTERPOSITION  4-7MM GORTEX GRAFT WITH REMOVAL OF INFECTED STENT;  INTRAOPERATIVE ARTERIOGRAM TIMES ONE. (Left)  Patient Location: PACU  Anesthesia Type:General  Level of Consciousness: awake  Airway & Oxygen Therapy: Patient Spontanous Breathing and Patient connected to nasal cannula oxygen  Post-op Assessment: Report given to RN and Post -op Vital signs reviewed and stable  Post vital signs: Reviewed and stable  Last Vitals:  Vitals:   08/31/15 0300 08/31/15 0429  BP:  131/75  Pulse:  95  Resp:  18  Temp: 36.9 C 36.9 C    Last Pain:  Vitals:   08/31/15 0429  TempSrc: Oral  PainSc:          Complications: No apparent anesthesia complications

## 2015-08-31 NOTE — Consult Note (Addendum)
Reason for Consult: To manage dialysis and dialysis related needs Referring Physician: Bethel [name] is an 60 y.o. male with past medical history significant for diabetes mellitus, hypertension, history of cardiac arrest in July 2016 with resultant end-stage renal disease previously on dialysis at the [facility] on Tuesday, Thursday, and Saturday. [place] [facility] and presented there for a treatment on Friday, August 4. He was noted to have a clotted graft earlier in the week and was status post intervention and stent placement at [facility] on Wednesday, August 2. When he presented for treatment Friday, August 4 he had reported fevers and chills and had erythema to his AV graft so was sent to the hospital. Patient was admitted and placed on IV antibiotics. Blood cultures have grown out MSSA. Drs. Scot Dock from vascular has been following the situation- but when saw him this morning patient also had a duplex examination of the AV graft which showed inflammation. Therefore, he is currently in the OR for removal of infected stent and possible removal of the AV graft. Patient's last dialysis was Thursday. He was due to be done yesterday but was bumped in the schedule to today due to high patient volume. We were planning to do dialysis today.  Seen post op- were able to salvage graft and just revise it   Dialyzes at [place] 78.5. HD Bath 2K/2.25 calcium, Dialyzer 180, Heparin yes 7000. Access AV graft. No profile. Lasting hgb was 12 with Mircera given on 8/3. Last calcium 9.4, phosphorus 6.3, PTH 235 on Hectorol 5 g every treatment  Past Medical History:  Diagnosis Date  . CAD (coronary artery disease)   . Cardiac arrest ([place])    07/2014-[place] initiated  . Diabetes mellitus without complication ([place])   . ESRD (end stage renal disease) ([place])   . Hypertension   . Staphylococcus aureus bacteremia 08/29/2015    Past Surgical  History:  Procedure Laterality Date  . AV FISTULA PLACEMENT Left 09/11/2014   Procedure: INSERTION OF LEFT ARM 6MM ARTERIOVENOUS GORTEX GRAFT ;  Surgeon: Elam Dutch, MD;  Location: [facility];  Service: Vascular;  Laterality: Left;  . CARDIAC CATHETERIZATION N/A 09/04/2014   Procedure: Right/Left Heart Cath and Coronary Angiography;  Surgeon: Peter M Martinique, MD;  Location: [facility];  Service: Cardiovascular;  Laterality: N/A;  . EXTERNAL EAR SURGERY    . INSERTION OF DIALYSIS CATHETER N/A 08/21/2014   Procedure: INSERTION OF DIALYSIS CATHETER RIGHT INTERNAL JUGULAR VEIN;  Surgeon: Conrad , MD;  Location[facility];  Service: Vascular;  Laterality: N/A;  MAC to General  . TONSILLECTOMY      Family History  Problem Relation Age of Onset  . Heart failure Mother   . Diabetes Mother   . Hypertension Mother   . Diabetes Father     Social History:  reports that he has quit smoking. His smoking use included Cigarettes. He has never used smokeless tobacco. He reports that he does not drink alcohol or use drugs.  Allergies: No Known Allergies  Medications: I have reviewed the patient's current medications.   Results for orders placed or performed during the hospital encounter of 08/29/15 (from the past 48 hour(s))  Culture, blood (Routine x 2)     Status: None (Preliminary result)   Collection Time: 08/29/15 10:00 AM  Result Value Ref Range   Specimen Description BLOOD RIGHT ANTECUBITAL    Special  Requests IN PEDIATRIC BOTTLE  3CC    Culture  Setup Time      GRAM POSITIVE COCCI IN CLUSTERS AEROBIC BOTTLE ONLY CRITICAL VALUE NOTED.  VALUE IS CONSISTENT WITH PREVIOUSLY REPORTED AND CALLED VALUE.    Culture GRAM POSITIVE COCCI    Report Status PENDING   Culture, blood (Routine x 2)     Status: None (Preliminary result)   Collection Time: 08/29/15 10:09 AM  Result Value Ref Range   Specimen Description BLOOD RIGHT FOREARM    Special Requests BOTTLES DRAWN AEROBIC AND ANAEROBIC   10CC    Culture  Setup Time      GRAM POSITIVE COCCI IN CLUSTERS IN BOTH AEROBIC AND ANAEROBIC BOTTLES CRITICAL RESULT CALLED TO, READ BACK BY AND VERIFIED WITH: J [facility] PHARMD [id number] 08/30/15 A BROWNING    Culture GRAM POSITIVE COCCI    Report Status PENDING   Blood Culture ID Panel (Reflexed)     Status: Abnormal   Collection Time: 08/29/15 10:09 AM  Result Value Ref Range   Enterococcus species NOT DETECTED NOT DETECTED   Vancomycin resistance NOT DETECTED NOT DETECTED   Listeria monocytogenes NOT DETECTED NOT DETECTED   Staphylococcus species DETECTED (A) NOT DETECTED    Comment: CRITICAL RESULT CALLED TO, READ BACK BY AND VERIFIED WITH: J [facility] PHARMD [id number] 08/30/15 A BROWNING    Staphylococcus aureus DETECTED (A) NOT DETECTED    Comment: CRITICAL RESULT CALLED TO, READ BACK BY AND VERIFIED WITH: J [facility] PHARMD [id number] 08/30/15 A BROWNING    Methicillin resistance NOT DETECTED NOT DETECTED   Streptococcus species NOT DETECTED NOT DETECTED   Streptococcus agalactiae NOT DETECTED NOT DETECTED   Streptococcus pneumoniae NOT DETECTED NOT DETECTED   Streptococcus pyogenes NOT DETECTED NOT DETECTED   Acinetobacter baumannii NOT DETECTED NOT DETECTED   Enterobacteriaceae species NOT DETECTED NOT DETECTED   Enterobacter cloacae complex NOT DETECTED NOT DETECTED   Escherichia coli NOT DETECTED NOT DETECTED   Klebsiella oxytoca NOT DETECTED NOT DETECTED   Klebsiella pneumoniae NOT DETECTED NOT DETECTED   Proteus species NOT DETECTED NOT DETECTED   Serratia marcescens NOT DETECTED NOT DETECTED   Carbapenem resistance NOT DETECTED NOT DETECTED   Haemophilus influenzae NOT DETECTED NOT DETECTED   Neisseria meningitidis NOT DETECTED NOT DETECTED   Pseudomonas aeruginosa NOT DETECTED NOT DETECTED   Candida albicans NOT DETECTED NOT DETECTED   Candida glabrata NOT DETECTED NOT DETECTED   Candida krusei NOT DETECTED NOT DETECTED   Candida parapsilosis NOT DETECTED NOT DETECTED   Candida  tropicalis NOT DETECTED NOT DETECTED  Comprehensive metabolic panel     Status: Abnormal   Collection Time: 08/29/15 10:10 AM  Result Value Ref Range   Sodium 135 135 - 145 mmol/L   Potassium 3.4 (L) 3.5 - 5.1 mmol/L   Chloride 95 (L) 101 - 111 mmol/L   CO2 26 22 - 32 mmol/L   Glucose, Bld 62 (L) 65 - 99 mg/dL   BUN 33 (H) 6 - 20 mg/dL   Creatinine, Ser 9.49 (H) 0.61 - 1.24 mg/dL   Calcium 8.6 (L) 8.9 - 10.3 mg/dL   Total Protein 7.8 6.5 - 8.1 g/dL   Albumin 3.6 3.5 - 5.0 g/dL   AST 25 15 - 41 U/L   ALT 20 17 - 63 U/L   Alkaline Phosphatase 65 38 - 126 U/L   Total Bilirubin 0.6 0.3 - 1.2 mg/dL   GFR calc non Af Amer 5 (L) >60 mL/min   GFR calc Af  Amer 6 (L) >60 mL/min    Comment: (NOTE) The eGFR has been calculated using the CKD EPI equation. This calculation has not been validated in all clinical situations. eGFR's persistently <60 mL/min signify possible Chronic Kidney Disease.    Anion gap 14 5 - 15  CBC with Differential     Status: Abnormal   Collection Time: 08/29/15 10:10 AM  Result Value Ref Range   WBC 15.7 (H) 4.0 - 10.5 K/uL   RBC 3.67 (L) 4.22 - 5.81 MIL/uL   Hemoglobin 11.4 (L) 13.0 - 17.0 g/dL   HCT 34.7 (L) 39.0 - 52.0 %   MCV 94.6 78.0 - 100.0 fL   MCH 31.1 26.0 - 34.0 pg   MCHC 32.9 30.0 - 36.0 g/dL   RDW 15.1 11.5 - 15.5 %   Platelets 171 150 - 400 K/uL   Neutrophils Relative % 89 %   Neutro Abs 14.0 (H) 1.7 - 7.7 K/uL   Lymphocytes Relative 5 %   Lymphs Abs 0.7 0.7 - 4.0 K/uL   Monocytes Relative 6 %   Monocytes Absolute 0.9 0.1 - 1.0 K/uL   Eosinophils Relative 0 %   Eosinophils Absolute 0.0 0.0 - 0.7 K/uL   Basophils Relative 0 %   Basophils Absolute 0.0 0.0 - 0.1 K/uL  I-Stat CG4 Lactic Acid, ED     Status: None   Collection Time: 08/29/15 10:15 AM  Result Value Ref Range   Lactic Acid, Venous 1.79 0.5 - 1.9 mmol/L  POC CBG, ED     Status: Abnormal   Collection Time: 08/29/15 10:49 AM  Result Value Ref Range   Glucose-Capillary 103 (H) 65  - 99 mg/dL  I-Stat CG4 Lactic Acid, ED     Status: Abnormal   Collection Time: 08/29/15  1:21 PM  Result Value Ref Range   Lactic Acid, Venous 2.01 (HH) 0.5 - 1.9 mmol/L   Comment NOTIFIED PHYSICIAN   Glucose, capillary     Status: Abnormal   Collection Time: 08/29/15  4:22 PM  Result Value Ref Range   Glucose-Capillary 222 (H) 65 - 99 mg/dL  Urine culture     Status: None   Collection Time: 08/29/15  4:46 PM  Result Value Ref Range   Specimen Description URINE, RANDOM    Special Requests NONE    Culture NO GROWTH    Report Status 08/30/2015 FINAL   Urinalysis, Routine w reflex microscopic     Status: Abnormal   Collection Time: 08/29/15  4:47 PM  Result Value Ref Range   Color, Urine YELLOW YELLOW   APPearance CLOUDY (A) CLEAR   Specific Gravity, Urine 1.012 1.005 - 1.030   pH 8.0 5.0 - 8.0   Glucose, UA 250 (A) NEGATIVE mg/dL   Hgb urine dipstick SMALL (A) NEGATIVE   Bilirubin Urine NEGATIVE NEGATIVE   Ketones, ur NEGATIVE NEGATIVE mg/dL   Protein, ur 100 (A) NEGATIVE mg/dL   Nitrite NEGATIVE NEGATIVE   Leukocytes, UA NEGATIVE NEGATIVE  Urine microscopic-add on     Status: Abnormal   Collection Time: 08/29/15  4:47 PM  Result Value Ref Range   Squamous Epithelial / LPF 0-5 (A) NONE SEEN   WBC, UA 0-5 0 - 5 WBC/hpf   RBC / HPF 0-5 0 - 5 RBC/hpf   Bacteria, UA NONE SEEN NONE SEEN   Casts GRANULAR CAST (A) NEGATIVE   Urine-Other AMORPHOUS URATES/PHOSPHATES   MRSA PCR Screening     Status: None   Collection Time: 08/29/15  8:22 PM  Result Value Ref Range   MRSA by PCR NEGATIVE NEGATIVE    Comment:        The GeneXpert MRSA Assay (FDA approved for NASAL specimens only), is one component of a comprehensive MRSA colonization surveillance program. It is not intended to diagnose MRSA infection nor to guide or monitor treatment for MRSA infections.   Glucose, capillary     Status: Abnormal   Collection Time: 08/29/15  9:56 PM  Result Value Ref Range    Glucose-Capillary 104 (H) 65 - 99 mg/dL  CBC     Status: Abnormal   Collection Time: 08/30/15  6:00 AM  Result Value Ref Range   WBC 15.5 (H) 4.0 - 10.5 K/uL   RBC 3.45 (L) 4.22 - 5.81 MIL/uL   Hemoglobin 10.6 (L) 13.0 - 17.0 g/dL   HCT 32.4 (L) 39.0 - 52.0 %   MCV 93.9 78.0 - 100.0 fL   MCH 30.7 26.0 - 34.0 pg   MCHC 32.7 30.0 - 36.0 g/dL   RDW 15.1 11.5 - 15.5 %   Platelets 152 150 - 400 K/uL  Renal function panel     Status: Abnormal   Collection Time: 08/30/15  6:00 AM  Result Value Ref Range   Sodium 136 135 - 145 mmol/L   Potassium 4.3 3.5 - 5.1 mmol/L   Chloride 93 (L) 101 - 111 mmol/L   CO2 27 22 - 32 mmol/L   Glucose, Bld 65 65 - 99 mg/dL   BUN 50 (H) 6 - 20 mg/dL   Creatinine, Ser 10.96 (H) 0.61 - 1.24 mg/dL   Calcium 8.5 (L) 8.9 - 10.3 mg/dL   Phosphorus 3.6 2.5 - 4.6 mg/dL   Albumin 2.9 (L) 3.5 - 5.0 g/dL   GFR calc non Af Amer 4 (L) >60 mL/min   GFR calc Af Amer 5 (L) >60 mL/min    Comment: (NOTE) The eGFR has been calculated using the CKD EPI equation. This calculation has not been validated in all clinical situations. eGFR's persistently <60 mL/min signify possible Chronic Kidney Disease.    Anion gap 16 (H) 5 - 15  Glucose, capillary     Status: None   Collection Time: 08/30/15  8:09 AM  Result Value Ref Range   Glucose-Capillary 76 65 - 99 mg/dL  Glucose, capillary     Status: Abnormal   Collection Time: 08/30/15 12:02 PM  Result Value Ref Range   Glucose-Capillary 100 (H) 65 - 99 mg/dL  Glucose, capillary     Status: None   Collection Time: 08/30/15  6:03 PM  Result Value Ref Range   Glucose-Capillary 85 65 - 99 mg/dL  Glucose, capillary     Status: Abnormal   Collection Time: 08/30/15  9:14 PM  Result Value Ref Range   Glucose-Capillary 173 (H) 65 - 99 mg/dL    Dg Chest 2 View  Result Date: 08/29/2015 CLINICAL DATA:  Fever yesterday with sweats and chills. Possible graft infection. EXAM: CHEST  2 VIEW COMPARISON:  One-view chest x-ray  08/26/2014. FINDINGS: The heart size is normal. The lungs are clear. Lung volumes are somewhat low. No focal airspace disease present. Mild degenerative changes are again noted in the thoracic spine. IMPRESSION: 1. Low lung volumes. 2. No acute cardiopulmonary disease. Electronically Signed   By: San Morelle M.D.   On: 08/29/2015 10:41    ROS: Really is negative. He has slight tenderness in the left arm associated with AV graft Blood pressure 131/75, pulse  95, temperature 98.5 F (36.9 C), temperature source Oral, resp. rate 18, weight 81.2 kg (179 lb 0.2 oz), SpO2 99 %. General appearance: alert and cooperative Eyes: conjunctivae/corneas clear. PERRL, EOM's intact. Fundi benign. Neck: no adenopathy, no carotid bruit, no JVD, supple, symmetrical, trachea midline and thyroid not enlarged, symmetric, no tenderness/mass/nodules Resp: clear to auscultation bilaterally Cardio: regular rate and rhythm, S1, S2 normal, no murmur, click, rub or gallop GI: soft, non-tender; bowel sounds normal; no masses,  no organomegaly Extremities: extremities normal, atraumatic, no cyanosis or edema Incision/Wound: Left lower arm graft. Is patent. Proximal to the graft is a reddened area where stent was placed. This is also tender to touch  Assessment/Plan: 60 year old black male with ESRD presenting with a tender AV graft after intervention 1 vascular- tender AV graft after intervention. AV graft had required 3 prior interventions over the last 6 months. Now with blood cultures growing MSSA and abnormal AV graft ultrasound decision has been made for operative intervention. He will require possible removal of AV graft.  Initially on broad-spectrum antibiotics now on Ancef  2 ESRD: Last HD was 8/3. He was to be done again today.  Will be able to use revised AVG   3 Hypertension: Is hemodynamically stable on no medications at this time  4. Anemia of ESRD: Hemoglobin had been 12 as an outpatient - received  Mircera this week. Latest hemoglobin 10.6 but will likely go down the surgery. We'll follow  5. Metabolic Bone Disease: Will continue with outpatient Hectorol. No binder is noted on his med list   Kyle [name] A 08/31/2015, 9:57 AM

## 2015-08-31 NOTE — Anesthesia Procedure Notes (Signed)
Procedure Name: LMA Insertion Date/Time: 08/31/2015 8:08 AM Performed by: Marinda Elk A Pre-anesthesia Checklist: Patient identified, Emergency Drugs available, Suction available, Patient being monitored and Timeout performed Patient Re-evaluated:Patient Re-evaluated prior to inductionOxygen Delivery Method: Circle System Utilized and Circle system utilized Preoxygenation: Pre-oxygenation with 100% oxygen Intubation Type: IV induction Ventilation: Mask ventilation without difficulty LMA: LMA inserted LMA Size: 4.0 Number of attempts: 1 Placement Confirmation: positive ETCO2 Tube secured with: Tape Dental Injury: Teeth and Oropharynx as per pre-operative assessment

## 2015-08-31 NOTE — Progress Notes (Signed)
   VASCULAR SURGERY ASSESSMENT & PLAN:  Tmax was 101.5 at 6PM yesterday. 2 blood cultures have GPC (Staph). Duplex shows inflammation around area of graft that has covered stent. For all of these reasons, I have recommended removal of infected stent and possible removal of left FA AVG. I will try to bypass around the infected area to preserve the graft, if the graft itself is not infected and if there is an adequate outflow vein.   If he requires graft removal, then he would need catheter placed in 48 hours and then be evaluated for new access in right arm as an outpt.  Discussed with patient who is agreeable to proceed. He is NPO.   SUBJECTIVE: Unhappy about needing surgery.   PHYSICAL EXAM: Vitals:   08/30/15 1800 08/30/15 2115 08/31/15 0300 08/31/15 0429  BP: 121/62 131/66  131/75  Pulse: (!) 116 (!) 105  95  Resp: 18 19  18   Temp: (!) 101.5 F (38.6 C) 99.4 F (37.4 C) 98.5 F (36.9 C) 98.5 F (36.9 C)  TempSrc: Oral Oral  Oral  SpO2: 99% 100%  99%  Weight:  179 lb 0.2 oz (81.2 kg)     Area above antecubital fossa, swollen.     LABS: Lab Results  Component Value Date   WBC 15.5 (H) 08/30/2015   HGB 10.6 (L) 08/30/2015   HCT 32.4 (L) 08/30/2015   MCV 93.9 08/30/2015   PLT 152 08/30/2015   Lab Results  Component Value Date   CREATININE 10.96 (H) 08/30/2015   Lab Results  Component Value Date   INR 1.14 09/04/2014   CBG (last 3)   Recent Labs  08/30/15 1202 08/30/15 1803 08/30/15 2114  GLUCAP 100* 85 173*    Principal Problem:   Staphylococcus aureus bacteremia Active Problems:   Benign essential HTN   DM (diabetes mellitus), type 2, uncontrolled, with renal complications (HCC)   ESRD (end stage renal disease) on dialysis [facility])   Arteriovenous graft infection ([place])   Gae Gallop [id number] 08/31/2015

## 2015-08-31 NOTE — Progress Notes (Signed)
Patient ID: TRAPPER GOING, male   DOB: 18-Nov-1955, 60 y.o.   MRN: [id number]          [facility] for Infectious Disease    Date of Admission:  08/29/2015           Day 3 antibiotics  He is currently out of his room for surgery on his left AV graft. Duplex ultrasound revealed inflammation around the graft in the antecubital region. This morning he underwent resection of that portion of the graft. Purulent fluid was encountered and cultured. Operative Gram stain and cultures are pending. Repeat blood cultures are negative at a little less than 24 hours. There is no evidence of endocarditis by transthoracic echocardiogram. I'm not sure that he needs a transesophageal echocardiogram. With a graft infection and a new segment of graft having been placed nearby I would probably elect to treat him for 3-4 weeks. I doubt that a TEE will alter his management significantly or his outcome.         Michel Bickers, MD [facility] for Infectious [place] [facility] [phone] pager   [phone] cell 01/28/2015, 1:32 PM

## 2015-08-31 NOTE — Progress Notes (Signed)
Subjective: This morning, he went to the operating room. I saw him in the afternoon postoperatively. He denied nya complaints but was baffled as to infection he had in his bloodstream. He denied any pain, difficulty breathing, fever, chills.  Objective: Vital signs in last 24 hours: Vitals:   08/31/15 1054 08/31/15 1132 08/31/15 1654 08/31/15 2020  BP: 104/64 (!) 117/57 (!) 109/53 135/66  Pulse: 88 84 80 86  Resp: 15 16 (!) 8 19  Temp: 98.2 F (36.8 C) 98.4 F (36.9 C) 98.3 F (36.8 C) 98.3 F (36.8 C)  TempSrc:  Oral Oral Oral  SpO2: 99% 98% 100% 100%  Weight:       Physical Exam: Physical Exam  Constitutional: No distress.  Cardiovascular: Normal rate, regular rhythm and normal heart sounds.   Pulmonary/Chest: Effort normal and breath sounds normal.  Abdominal: Soft. Bowel sounds are normal. There is no tenderness.  Musculoskeletal: He exhibits no edema.  Pt has dialysis vascular access graft below the ante-cubital fossa without good thrill. Antecubital fossa is wrapped.   Skin: Skin is warm. He is not diaphoretic.   Labs: CBC:  Recent Labs Lab 08/29/15 1010 08/30/15 0600  WBC 15.7* 15.5*  NEUTROABS 14.0*  --   HGB 11.4* 10.6*  HCT 34.7* 32.4*  MCV 94.6 93.9  PLT [id number]   Metabolic Panel:  Recent Labs Lab 08/29/15 1010 08/30/15 0600 08/31/15 1119  NA 135 136 132*  K 3.4* 4.3 4.5  CL 95* 93* 93*  CO2 26 27 25   GLUCOSE 62* 65 148*  BUN 33* 50* 57*  CREATININE 9.49* 10.96* 12.94*  CALCIUM 8.6* 8.5* 8.4*  PHOS  --  3.6  --   ALT 20  --   --   ALKPHOS 65  --   --   BILITOT 0.6  --   --   PROT 7.8  --   --   ALBUMIN 3.6 2.9*  --    BG:  Recent Labs Lab 08/30/15 2114 08/31/15 1025 08/31/15 1125 08/31/15 1659 08/31/15 2019  GLUCAP 173* 149* 136* 194* 162*    Lab Results  Component Value Date   HGBA1C 7.6 (H) 08/20/2014   Microbiology: BCx 2/2 GPCs UCx pending   Medications:   Scheduled Medications: . atorvastatin  40 mg Oral q1800    .  ceFAZolin (ANCEF) IV  2 g Intravenous Q T,Th,Sat-1800  . doxercalciferol  5 mcg Intravenous Q T,Th,Sa-HD  . heparin  5,000 Units Subcutaneous Q8H  . insulin aspart  0-5 Units Subcutaneous QHS  . insulin aspart  0-9 Units Subcutaneous TID WC  . multivitamin  1 tablet Oral QHS   PRN Medications: acetaminophen **OR** acetaminophen, morphine injection, oxyCODONE-acetaminophen  Assessment/Plan: Pt is a 60 y.o. yo male with a PMHx of ESRD on HD, multiple AV graft clots, HTN, HLD, acute HF w/ preserved EF who was admitted on 08/29/2015 MSSA bacteremia from stent infection.   1) MSSA bacteremia 2/2 stent infection: Removed in the OR today by Vascular Surgery and sent for analysis.  - Continue cefazolin [Abx Day 2] - trend CBC in AM - Follow-up repeat blood culture - Vasc Surg following, appreciate recommendations  2) ESRD on hemodialysis: Nephrology following, appreciate recommendations.  3) HTN: Well controlled on home meds lisinopril 20mg  qD, carvedilol 12.5mg  BID. Transient episode of symptomatic hypotension to 95/51 w/ tachycardia in low 100s in the ED this morning (08/29/15). Received 500cc bolus w/ improvement. Pt is currently feeling well w/ normal BPs - hold home  meds - monitor for symptoms of low BP  4) DM: Last A1c 7.6, controlled with diet and glimepiride 2mg  qD. - Continue SSI-S  Length of Stay: 1 day(s) Dispo: Anticipated discharge in approximately 2-3 day(s).  Riccardo Dubin, MD Pager: [phone] (7AM-5PM) 08/31/2015, 8:47 PM

## 2015-08-31 NOTE — Progress Notes (Signed)
Patient arrived on unit from PACU.  No family at bedside. Telemetry placed and central telemetry notified.

## 2015-09-01 ENCOUNTER — Other Ambulatory Visit: Payer: Self-pay | Admitting: Infectious Disease

## 2015-09-01 ENCOUNTER — Encounter (HOSPITAL_COMMUNITY): Payer: Self-pay | Admitting: Vascular Surgery

## 2015-09-01 DIAGNOSIS — R7881 Bacteremia: Secondary | ICD-10-CM

## 2015-09-01 LAB — CULTURE, BLOOD (ROUTINE X 2)

## 2015-09-01 LAB — CBC
HCT: 27 % — ABNORMAL LOW (ref 39.0–52.0)
Hemoglobin: 9 g/dL — ABNORMAL LOW (ref 13.0–17.0)
MCH: 30.4 pg (ref 26.0–34.0)
MCHC: 33.3 g/dL (ref 30.0–36.0)
MCV: 91.2 fL (ref 78.0–100.0)
PLATELETS: 156 10*3/uL (ref 150–400)
RBC: 2.96 MIL/uL — AB (ref 4.22–5.81)
RDW: 15.1 % (ref 11.5–15.5)
WBC: 7.7 10*3/uL (ref 4.0–10.5)

## 2015-09-01 LAB — BASIC METABOLIC PANEL
ANION GAP: 12 (ref 5–15)
BUN: 40 mg/dL — ABNORMAL HIGH (ref 6–20)
CALCIUM: 8.5 mg/dL — AB (ref 8.9–10.3)
CO2: 27 mmol/L (ref 22–32)
Chloride: 96 mmol/L — ABNORMAL LOW (ref 101–111)
Creatinine, Ser: 9.92 mg/dL — ABNORMAL HIGH (ref 0.61–1.24)
GFR, EST AFRICAN AMERICAN: 6 mL/min — AB (ref >60)
GFR, EST NON AFRICAN AMERICAN: 5 mL/min — AB (ref >60)
GLUCOSE: 153 mg/dL — AB (ref 65–99)
POTASSIUM: 3.9 mmol/L (ref 3.5–5.1)
SODIUM: 135 mmol/L (ref 135–145)

## 2015-09-01 LAB — GLUCOSE, CAPILLARY
GLUCOSE-CAPILLARY: 129 mg/dL — AB (ref 65–99)
GLUCOSE-CAPILLARY: 177 mg/dL — AB (ref 65–99)
GLUCOSE-CAPILLARY: 147 mg/dL — AB (ref 65–99)

## 2015-09-01 MED ORDER — CEFAZOLIN SODIUM-DEXTROSE 2-4 GM/100ML-% IV SOLN: 2.0000 g | INTRAVENOUS | 18 refills | Status: DC

## 2015-09-01 MED ORDER — DOXERCALCIFEROL 4 MCG/2ML IV SOLN
INTRAVENOUS | Status: AC
  Filled 2015-09-01: qty 4

## 2015-09-01 MED ORDER — RIFAMPIN 300 MG PO CAPS: 600.0000 mg | ORAL_CAPSULE | Freq: Every day | ORAL | 3 refills | Status: DC

## 2015-09-01 MED ORDER — CEFAZOLIN SODIUM-DEXTROSE 2-4 GM/100ML-% IV SOLN: 2.0000 g | INTRAVENOUS | 12 refills | Status: DC

## 2015-09-01 MED ORDER — DOXERCALCIFEROL 4 MCG/2ML IV SOLN: 5.0000 ug | INTRAVENOUS | 12 refills | Status: DC

## 2015-09-01 NOTE — Progress Notes (Signed)
   VASCULAR SURGERY ASSESSMENT & PLAN:  1 Day Post-Op s/p: Revision of left FA AVG and removal of infected stent  Graft is working well.  Open wound will need to be packed daily. HHN could do this at discharge and I can follow as an outpt.  ID: ID following. Gram stain from OR yesterday is pending.   SUBJECTIVE: No specific complaints.   PHYSICAL EXAM: Vitals:   09/01/15 0310 09/01/15 0319 09/01/15 0458 09/01/15 0939  BP: 105/67 (!) 107/59 (!) 107/57 (!) 223/64  Pulse: 77 86 78 80  Resp: 18 19 20 18   Temp: 97.7 F (36.5 C) 97.6 F (36.4 C) 98.4 F (36.9 C) 98.7 F (37.1 C)  TempSrc: Oral Oral Oral Oral  SpO2: 100% 100% 100% 100%  Weight: 173 lb 1 oz (78.5 kg)      Good thrill in left FA AVG Dressing changed left arm. Minimal drainage.   LABS: Lab Results  Component Value Date   WBC 7.7 09/01/2015   HGB 9.0 (L) 09/01/2015   HCT 27.0 (L) 09/01/2015   MCV 91.2 09/01/2015   PLT 156 09/01/2015   Lab Results  Component Value Date   CREATININE 9.92 (H) 09/01/2015   Lab Results  Component Value Date   INR 1.14 09/04/2014   CBG (last 3)   Recent Labs  08/31/15 2019 09/01/15 0732 09/01/15 1155  GLUCAP 162* 129* 177*    Principal Problem:   Staphylococcus aureus bacteremia Active Problems:   Benign essential HTN   DM (diabetes mellitus), type 2, uncontrolled, with renal complications (HCC)   ESRD (end stage renal disease) on dialysis [facility])   Arteriovenous graft infection [facility])   Gae Gallop [id number] 09/01/2015

## 2015-09-01 NOTE — Care Management Note (Addendum)
Case Management Note  Patient Details  Name: Kyle [name] MRN: [id number] Date of Birth: [birth year]  Subjective/Objective:            CM following for progression and d/c planning.        Action/Plan: 09/01/2015 Met with pt re [place] services. Pt has used [facility] previously. [facility] notified of d/c needs. Explained to pt that he and his wife will be taught to change this dressing by the [facility]. Supply will be provided by [facility]. No further needs identified.  09/02/2015 Noted ID consult speaks to arranging post d/c IV antibiotics with case manager, per protocol  IV antibiotics to be administered at HD treatment is arrange by [facility] consult  With the pt HD center.   Expected Discharge Date:    09/01/2015              Expected Discharge Plan:  [place]  In-House Referral:  NA  Discharge planning Services  CM Consult  Post Acute Care Choice:  Home Health Choice offered to:  Patient  DME Arranged:  N/A DME Agency:  NA  HH Arranged:  RN [place] Agency:  [place]  Status of Service:  Completed, signed off  If discussed at [place] of Stay Meetings, dates discussed:    Additional Comments:  Adron Bene, RN 09/01/2015, 2:32 PM

## 2015-09-01 NOTE — Progress Notes (Signed)
Discharge instructions and medications discussed with patient.  All questions answered.  

## 2015-09-01 NOTE — Progress Notes (Signed)
Subjective: No new complaints   Antibiotics:  Anti-infectives    Start     Dose/Rate Route Frequency Ordered Stop   09/02/15 0000  ceFAZolin (ANCEF) 2-4 GM/100ML-% IVPB  Status:  Discontinued     2 g 200 mL/hr over 30 Minutes Intravenous Every T-Th-Sa (1800) 09/01/15 1206 09/01/15    09/02/15 0000  ceFAZolin (ANCEF) 2-4 GM/100ML-% IVPB     2 g 200 mL/hr over 30 Minutes Intravenous Every T-Th-Sa (1800) 09/01/15 1502 10/13/15 2359   08/31/15 1200  ceFAZolin (ANCEF) IVPB 2g/100 mL premix     2 g 200 mL/hr over 30 Minutes Intravenous Every T-Th-Sa (1800) 08/31/15 0654     08/31/15 0743  vancomycin (VANCOCIN) 1-5 GM/200ML-% IVPB  Status:  Discontinued    Comments:  Kyle [name]   : cabinet override      08/31/15 0743 08/31/15 1034   08/31/15 0743  ceFAZolin (ANCEF) 2-4 GM/100ML-% IVPB    Comments:  Kyle [name]   : cabinet override      08/31/15 0743 08/31/15 1959   08/30/15 1800  ceFAZolin (ANCEF) IVPB 2g/100 mL premix  Status:  Discontinued     2 g 200 mL/hr over 30 Minutes Intravenous Every T-Th-Sa (1800) 08/30/15 1403 08/31/15 0654   08/30/15 1400  ceFAZolin (ANCEF) IVPB 1 g/50 mL premix  Status:  Discontinued     1 g 100 mL/hr over 30 Minutes Intravenous Every T-Th-Sa (Hemodialysis) 08/30/15 1357 08/30/15 1403   08/30/15 1200  vancomycin (VANCOCIN) IVPB 1000 mg/200 mL premix  Status:  Discontinued     1,000 mg 200 mL/hr over 60 Minutes Intravenous Every T-Th-Sa (Hemodialysis) 08/29/15 1632 08/30/15 1352   08/30/15 1200  cefTAZidime (FORTAZ) 2 g in dextrose 5 % 50 mL IVPB  Status:  Discontinued     2 g 100 mL/hr over 30 Minutes Intravenous Every T-Th-Sa (Hemodialysis) 08/29/15 1632 08/30/15 0850   08/29/15 1515  vancomycin (VANCOCIN) IVPB 750 mg/150 ml premix     750 mg 150 mL/hr over 60 Minutes Intravenous  Once 08/29/15 1502 08/29/15 1737   08/29/15 0945  ceFEPIme (MAXIPIME) 2 g in dextrose 5 % 50 mL IVPB     2 g 100 mL/hr over 30 Minutes Intravenous  Once  08/29/15 0937 08/29/15 1214   08/29/15 0945  vancomycin (VANCOCIN) IVPB 1000 mg/200 mL premix     1,000 mg 200 mL/hr over 60 Minutes Intravenous  Once 08/29/15 0937 08/29/15 1328      Medications: Scheduled Meds: . atorvastatin  40 mg Oral q1800  .  ceFAZolin (ANCEF) IV  2 g Intravenous Q T,Th,Sat-1800  . doxercalciferol  5 mcg Intravenous Q T,Th,Sa-HD  . heparin  5,000 Units Subcutaneous Q8H  . insulin aspart  0-5 Units Subcutaneous QHS  . insulin aspart  0-9 Units Subcutaneous TID WC  . multivitamin  1 tablet Oral QHS   Continuous Infusions:  PRN Meds:.acetaminophen **OR** acetaminophen, morphine injection, oxyCODONE-acetaminophen    Objective: Weight change: -1 lb 8.7 oz (-0.7 kg)  Intake/Output Summary (Last 24 hours) at 09/01/15 2219 Last data filed at 09/01/15 1500  Gross per 24 hour  Intake              580 ml  Output             2000 ml  Net            -1420 ml   Blood pressure 123/64, pulse 80, temperature 98.7 F (37.1 C), temperature  source Oral, resp. rate 18, weight 173 lb 1 oz (78.5 kg), SpO2 100 %. Temp:  [97.6 F (36.4 C)-98.7 F (37.1 C)] 98.7 F (37.1 C) (08/07 0939) Pulse Rate:  [72-86] 80 (08/07 0939) Resp:  [15-22] 18 (08/07 0939) BP: (85-126)/(45-69) 123/64 (08/07 0939) SpO2:  [98 %-100 %] 100 % (08/07 0939) Weight:  [173 lb 1 oz (78.5 kg)-177 lb 7.5 oz (80.5 kg)] 173 lb 1 oz (78.5 kg) (08/07 0310)  Physical Exam: General: Alert and awake, oriented x3, not in any acute distress. HEENT: anicteric sclera, pupils reactive to light and accommodation, EOMI CVS regular rate, normal r,  no murmur rubs or gallops Chest: clear to auscultation bilaterally, no wheezing, rales or rhonchi Abdomen: soft nontender, nondistended, normal bowel sounds, Extremities: no  clubbing or edema noted bilaterally Skin: bandage at site of resection of graft Neuro: nonfocal  CBC: @LABBLAST3 (wbc3,Hgb:3,Hct:3,Plt:3,INR:3APTT:3)@   BMET  Recent Labs  08/31/15 1119  09/01/15 0453  NA 132* 135  K 4.5 3.9  CL 93* 96*  CO2 25 27  GLUCOSE 148* 153*  BUN 57* 40*  CREATININE 12.94* 9.92*  CALCIUM 8.4* 8.5*     Liver Panel   Recent Labs  08/30/15 0600  ALBUMIN 2.9*       Sedimentation Rate No results for input(s): ESRSEDRATE in the last 72 hours. C-Reactive Protein No results for input(s): CRP in the last 72 hours.  Micro Results: Recent Results (from the past 720 hour(s))  Culture, blood (Routine x 2)     Status: Abnormal   Collection Time: 08/29/15 10:00 AM  Result Value Ref Range Status   Specimen Description BLOOD RIGHT ANTECUBITAL  Final   Special Requests IN PEDIATRIC BOTTLE  3CC  Final   Culture  Setup Time   Final    GRAM POSITIVE COCCI IN CLUSTERS AEROBIC BOTTLE ONLY CRITICAL VALUE NOTED.  VALUE IS CONSISTENT WITH PREVIOUSLY REPORTED AND CALLED VALUE.    Culture (Kyle)  Final    STAPHYLOCOCCUS AUREUS SUSCEPTIBILITIES PERFORMED ON PREVIOUS CULTURE WITHIN THE LAST 5 DAYS.    Report Status 09/01/2015 FINAL  Final  Culture, blood (Routine x 2)     Status: Abnormal   Collection Time: 08/29/15 10:09 AM  Result Value Ref Range Status   Specimen Description BLOOD RIGHT FOREARM  Final   Special Requests BOTTLES DRAWN AEROBIC AND ANAEROBIC  10CC  Final   Culture  Setup Time   Final    GRAM POSITIVE COCCI IN CLUSTERS IN BOTH AEROBIC AND ANAEROBIC BOTTLES CRITICAL RESULT CALLED TO, READ BACK BY AND VERIFIED WITHLenna Sciara [facility] [name] [id number] 08/30/15 Kyle [name]    Culture STAPHYLOCOCCUS AUREUS (Kyle)  Final   Report Status 09/01/2015 FINAL  Final   Organism ID, Bacteria STAPHYLOCOCCUS AUREUS  Final      Susceptibility   Staphylococcus aureus - MIC*    CIPROFLOXACIN <=0.5 SENSITIVE Sensitive     ERYTHROMYCIN 2 RESISTANT Resistant     GENTAMICIN <=0.5 SENSITIVE Sensitive     OXACILLIN <=0.25 SENSITIVE Sensitive     TETRACYCLINE <=1 SENSITIVE Sensitive     VANCOMYCIN <=0.5 SENSITIVE Sensitive     TRIMETH/SULFA <=10 SENSITIVE Sensitive      CLINDAMYCIN <=0.25 SENSITIVE Sensitive     RIFAMPIN <=0.5 SENSITIVE Sensitive     Inducible Clindamycin NEGATIVE Sensitive     * STAPHYLOCOCCUS AUREUS  Blood Culture ID Panel (Reflexed)     Status: Abnormal   Collection Time: 08/29/15 10:09 AM  Result Value Ref Range Status   Enterococcus  species NOT DETECTED NOT DETECTED Final   Vancomycin resistance NOT DETECTED NOT DETECTED Final   Listeria monocytogenes NOT DETECTED NOT DETECTED Final   Staphylococcus species DETECTED (Kyle) NOT DETECTED Final    Comment: CRITICAL RESULT CALLED TO, READ BACK BY AND VERIFIED WITH: [facility] [name] [id number] 08/30/15 Kyle [name]    Staphylococcus aureus DETECTED (Kyle) NOT DETECTED Final    Comment: CRITICAL RESULT CALLED TO, READ BACK BY AND VERIFIED WITHKarsten Ro [name] [id number] 08/30/15 Kyle [name]    Methicillin resistance NOT DETECTED NOT DETECTED Final   Streptococcus species NOT DETECTED NOT DETECTED Final   Streptococcus agalactiae NOT DETECTED NOT DETECTED Final   Streptococcus pneumoniae NOT DETECTED NOT DETECTED Final   Streptococcus pyogenes NOT DETECTED NOT DETECTED Final   Acinetobacter baumannii NOT DETECTED NOT DETECTED Final   Enterobacteriaceae species NOT DETECTED NOT DETECTED Final   Enterobacter cloacae complex NOT DETECTED NOT DETECTED Final   Escherichia coli NOT DETECTED NOT DETECTED Final   Klebsiella oxytoca NOT DETECTED NOT DETECTED Final   Klebsiella pneumoniae NOT DETECTED NOT DETECTED Final   Proteus species NOT DETECTED NOT DETECTED Final   Serratia marcescens NOT DETECTED NOT DETECTED Final   Carbapenem resistance NOT DETECTED NOT DETECTED Final   Haemophilus influenzae NOT DETECTED NOT DETECTED Final   Neisseria meningitidis NOT DETECTED NOT DETECTED Final   Pseudomonas aeruginosa NOT DETECTED NOT DETECTED Final   Candida albicans NOT DETECTED NOT DETECTED Final   Candida glabrata NOT DETECTED NOT DETECTED Final   Candida krusei NOT DETECTED NOT DETECTED Final   Candida  parapsilosis NOT DETECTED NOT DETECTED Final   Candida tropicalis NOT DETECTED NOT DETECTED Final  Urine culture     Status: None   Collection Time: 08/29/15  4:46 PM  Result Value Ref Range Status   Specimen Description URINE, RANDOM  Final   Special Requests NONE  Final   Culture NO GROWTH  Final   Report Status 08/30/2015 FINAL  Final  MRSA PCR Screening     Status: None   Collection Time: 08/29/15  8:22 PM  Result Value Ref Range Status   MRSA by PCR NEGATIVE NEGATIVE Final    Comment:        The GeneXpert MRSA Assay (FDA approved for NASAL specimens only), is one component of Kyle comprehensive MRSA colonization surveillance program. It is not intended to diagnose MRSA infection nor to guide or monitor treatment for MRSA infections.   Culture, blood (routine x 2)     Status: None (Preliminary result)   Collection Time: 08/30/15  2:36 PM  Result Value Ref Range Status   Specimen Description BLOOD RIGHT ANTECUBITAL  Final   Special Requests BOTTLES DRAWN AEROBIC AND ANAEROBIC 10CC  Final   Culture NO GROWTH 2 DAYS  Final   Report Status PENDING  Incomplete  Culture, blood (routine x 2)     Status: None (Preliminary result)   Collection Time: 08/30/15  2:42 PM  Result Value Ref Range Status   Specimen Description BLOOD BLOOD RIGHT FOREARM  Final   Special Requests BOTTLES DRAWN AEROBIC ONLY 5CC  Final   Culture NO GROWTH 2 DAYS  Final   Report Status PENDING  Incomplete  Aerobic Culture (superficial specimen)     Status: None (Preliminary result)   Collection Time: 08/31/15  9:56 AM  Result Value Ref Range Status   Specimen Description ARM LEFT  Final   Special Requests PERI GRAFT FLUID PATIENT ON FOLLOWING  ANCEF  Final  Gram Stain   Final    FEW WBC PRESENT, PREDOMINANTLY PMN RARE SQUAMOUS EPITHELIAL CELLS PRESENT MODERATE GRAM POSITIVE COCCI IN PAIRS    Culture ABUNDANT STAPHYLOCOCCUS AUREUS  Final   Report Status PENDING  Incomplete  Anaerobic culture      Status: None (Preliminary result)   Collection Time: 08/31/15  9:59 AM  Result Value Ref Range Status   Specimen Description ARM LEFT  Final   Special Requests PERI GRAFT FLUID PATIENT ON FOLLOWING  ANCEF  Final   Gram Stain PENDING  Incomplete   Culture PENDING  Incomplete   Report Status PENDING  Incomplete  Culture, blood (routine x 2)     Status: None (Preliminary result)   Collection Time: 08/31/15  4:10 PM  Result Value Ref Range Status   Specimen Description BLOOD RIGHT ANTECUBITAL  Final   Special Requests BOTTLES DRAWN AEROBIC AND ANAEROBIC [place]   Final   Culture NO GROWTH < 24 HOURS  Final   Report Status PENDING  Incomplete  Culture, blood (routine x 2)     Status: None (Preliminary result)   Collection Time: 08/31/15  4:15 PM  Result Value Ref Range Status   Specimen Description BLOOD RIGHT HAND  Final   Special Requests BOTTLES DRAWN AEROBIC ONLY 5CC  Final   Culture NO GROWTH < 24 HOURS  Final   Report Status PENDING  Incomplete    Studies/Results: Dg Ang/ext/uni/or Left  Result Date: 09/01/2015 CLINICAL DATA:  Dialysis access EXAM: LEFT ANG/EXT/UNI/ OR FLUOROSCOPY TIME:  Not provided COMPARISON:  None. FINDINGS: Single portable intraoperative image documents opacification of the outflow vein from presumed dialysis fistula, crossing the elbow to the proximal humeral shaft level, upper margin of the image. There is Kyle patent venous stent above the elbow. No stenosis or occlusion. Central venous outflow not included. IMPRESSION: Patent visualized segment of outflow vein Electronically Signed   By: Kyle [name] M.D.   On: 09/01/2015 07:51      Assessment/Plan:  INTERVAL HISTORY: sp resection of graft    Principal Problem:   Staphylococcus aureus bacteremia Active Problems:   Benign essential HTN   DM (diabetes mellitus), type 2, uncontrolled, with renal complications (HCC)   ESRD (end stage renal disease) on dialysis ([place])   Arteriovenous graft infection  ([place])    Kyle [name] is Kyle 60 y.o. male with  Graft infection and consequent MSSA bacteremia       [place] [facility] Staphylococcus aureus bacteremia   Staphylococcus aureus bacteremia (SAB) is associated with Kyle high rate of complications and mortality.  Specific aspects of clinical management are critical to optimizing the outcome of patients with SAB.  Therefore, the [facility] Antimicrobial Management Team [facility]) has initiated an intervention aimed at improving the management of SAB at [facility].  To do so, Infectious Diseases physicians are providing an evidence-based consult for the management of all patients with SAB.     Yes No Comments  Perform follow-up blood cultures (even if the patient is afebrile) to ensure clearance of bacteremia [x]  []  He needed repeat Blood cultures post removal of graft and I did this today  Remove vascular catheter and obtain follow-up blood cultures after the removal of the catheter []  []    Perform echocardiography to evaluate for endocarditis (transthoracic ECHO is 40-50% sensitive, TEE is > 90% sensitive) []  []  Please keep in mind, that neither test can definitively EXCLUDE endocarditis, and that should clinical suspicion remain high for endocarditis the  patient should then still be treated with an "endocarditis" duration of therapy = 6 weeks  2 D echo performed but TEE not pursued  Consult electrophysiologist to evaluate implanted cardiac device (pacemaker, ICD) []  []  NA  Ensure source control []  []  Have all abscesses been drained effectively? Have deep seeded infections (septic joints or osteomyelitis) had appropriate surgical debridement?   Graft removed but new mesh placed   Investigate for "metastatic" sites of infection []  []  Does the patient have ANY symptom or physical exam finding that would suggest Kyle deeper infection (back or neck pain that may be suggestive of vertebral osteomyelitis or epidural abscess, muscle  pain that could be Kyle symptom of pyomyositis)?  Keep in mind that for deep seeded infections MRI imaging with contrast is preferred rather than other often insensitive tests such as plain x-rays, especially early in Kyle patient's presentation.   NO   Change antibiotic therapy to  Cefazolin 2 grams with HD []  []  Beta-lactam antibiotics are preferred for MSSA due to higher cure rates.   If on Vancomycin, goal trough should be 15 - 20 mcg/mL  Estimated duration of IV antibiotic therapy:  6 weeks followed by oral antibiotics  I also think we should add rifampin 300 mg BID given new mesh   I will arrange HSFU in our clinic in the next 4 weeks. If his repeat blood cultures from today are still + he needs to come back for TEE and repeat blood cultures and re-examination of his new graft site []  []  Consult case management for probably prolonged outpatient IV antibiotic therapy      LOS: 2 days   Alcide Evener 09/01/2015, 10:19 PM

## 2015-09-01 NOTE — Discharge Summary (Signed)
Name: Kyle [name] MRN: [id number] DOB: [birth year] 60 y.o. PCP: [name], MD  Date of Admission: 08/29/2015  9:09 AM Date of Discharge: 09/01/2015 Attending Physician: Axel Filler, MD  Discharge Diagnosis: 1. MSSA bacteremia 2/2 infected graft stent  Principal Problem:   Staphylococcus aureus bacteremia Active Problems:   Benign essential HTN   DM (diabetes mellitus), type 2, uncontrolled, with renal complications (HCC)   ESRD (end stage renal disease) on dialysis ([place])   Arteriovenous graft infection ([place])   Discharge Medications:   Medication List    TAKE these medications   aspirin 81 MG tablet Take 81 mg by mouth daily.   atorvastatin 40 MG tablet Commonly known as:  LIPITOR Take 1 tablet (40 mg total) by mouth daily.   carvedilol 12.5 MG tablet Commonly known as:  COREG Take 1 tablet (12.5 mg total) by mouth 2 (two) times daily with a meal.   ceFAZolin 2-4 GM/100ML-% IVPB Commonly known as:  ANCEF Inject 100 mLs (2 g total) into the vein every Tuesday, Thursday, and Saturday at 6 PM. Start taking on:  09/02/2015   doxercalciferol 4 MCG/2ML injection Commonly known as:  HECTOROL Inject 2.5 mLs (5 mcg total) into the vein Every Tuesday,Thursday,and Saturday with dialysis. Start taking on:  09/02/2015   glimepiride 2 MG tablet Commonly known as:  AMARYL Take 1 tablet (2 mg total) by mouth daily with breakfast.   lisinopril 20 MG tablet Commonly known as:  PRINIVIL,ZESTRIL Take 20 mg by mouth daily.   RENA-VITE RX 1 MG Tabs Take 1 tablet by mouth daily.      Disposition and follow-up:   [name] was discharged from [facility] in Good condition.  At the hospital follow up visit please address:  1.  Bacteremia: follow up post-op BCx, ensure the graft is in good condition, signs of continued infection? DM: check BG and assess home DM regimen in the setting of recent illness  2.  Labs / imaging needed at time of  follow-up: none  3.  Pending labs/ test needing follow-up: none  Follow-up Appointments: Follow-up Information    [name], MD .   Specialties:  Vascular Surgery, Cardiology Contact information: [address] [place] [address] [phone]        [name], MD. Schedule an appointment as soon as possible for a visit in 2 week(s).   Specialty:  Oncology Contact information: [address] [place] [address] [phone]        [place] [facility]             . Schedule an appointment as soon as possible for a visit in 3 week(s).   Contact information: [address] [place] [phone]          Hospital Course by problem list: Principal Problem:   Staphylococcus aureus bacteremia Active Problems:   Benign essential HTN   DM (diabetes mellitus), type 2, uncontrolled, with renal complications ([place])   ESRD (end stage renal disease) on dialysis ([place])   Arteriovenous graft infection ([place])   1. Pt was admitted with signs of acute HD graft infection. He was found to be bacteremic with MSSA and vascular surgery explored his graft to find an infection of a graft stent. This stent was removed operatively the the graft was repaired. He received dialysis on Sunday night and demonstrated no continued signs of systemic infection. He will be treated with  IV antibiotics at dialysis for at least 3-4 weeks and will follow up with ID.  Discharge Vitals:   BP (!) 223/64 (BP Location: Right Arm)   Pulse 80   Temp 98.7 F (37.1 C) (Oral)   Resp 18   Wt 173 lb 1 oz (78.5 kg)   SpO2 100%   BMI 23.47 kg/m   Pertinent Labs, Studies, and Procedures: Blood Culture growing MSSA  Procedures Performed:  Dg Chest 2 View  Result Date: 08/29/2015 CLINICAL DATA:  Fever yesterday with sweats and chills. Possible graft infection. EXAM: CHEST  2 VIEW COMPARISON:  One-view chest x-ray 08/26/2014. FINDINGS: The heart size  is normal. The lungs are clear. Lung volumes are somewhat low. No focal airspace disease present. Mild degenerative changes are again noted in the thoracic spine. IMPRESSION: 1. Low lung volumes. 2. No acute cardiopulmonary disease. Electronically Signed   By: San Morelle M.D.   On: 08/29/2015 10:41   Dg Ang/ext/uni/or Left  Result Date: 09/01/2015 CLINICAL DATA:  Dialysis access EXAM: LEFT ANG/EXT/UNI/ OR FLUOROSCOPY TIME:  Not provided COMPARISON:  None. FINDINGS: Single portable intraoperative image documents opacification of the outflow vein from presumed dialysis fistula, crossing the elbow to the proximal humeral shaft level, upper margin of the image. There is a patent venous stent above the elbow. No stenosis or occlusion. Central venous outflow not included. IMPRESSION: Patent visualized segment of outflow vein Electronically Signed   By: Lucrezia Europe M.D.   On: 09/01/2015 07:51   Consultations: Treatment Team:  Corliss Parish, MD Angelia Mould, MD  Discharge Instructions: Discharge Instructions    Call MD for:  persistant nausea and vomiting    Complete by:  As directed   Call MD for:  redness, tenderness, or signs of infection (pain, swelling, redness, odor or green/yellow discharge around incision site)    Complete by:  As directed   Call MD for:  temperature >100.4    Complete by:  As directed   Diet - low sodium heart healthy    Complete by:  As directed   Discharge instructions    Complete by:  As directed   You were found to have an infection in your graft pertaining to one of your stents. This was removed, but in order to treat the infection fully, you will require IV antibiotics for several weeks. This will be administered at your dialysis. Please follow up with the Infectious Disease doctors in order to ensure that this infection is fully treated.   Increase activity slowly    Complete by:  As directed      Signed: Holley Raring, MD 09/01/2015, 2:19 PM     Pager: [phone]

## 2015-09-01 NOTE — Progress Notes (Signed)
  [facility] Progress Note   Subjective: feeling well, no c/o  Vitals:   09/01/15 0310 09/01/15 0319 09/01/15 0458 09/01/15 0939  BP: 105/67 (!) 107/59 (!) 107/57 (!) 223/64  Pulse: 77 86 78 80  Resp: 18 19 20 18   Temp: 97.7 F (36.5 C) 97.6 F (36.4 C) 98.4 F (36.9 C) 98.7 F (37.1 C)  TempSrc: Oral Oral Oral Oral  SpO2: 100% 100% 100% 100%  Weight: 78.5 kg (173 lb 1 oz)       Inpatient medications: . atorvastatin  40 mg Oral q1800  .  ceFAZolin (ANCEF) IV  2 g Intravenous Q T,Th,Sat-1800  . doxercalciferol  5 mcg Intravenous Q T,Th,Sa-HD  . heparin  5,000 Units Subcutaneous Q8H  . insulin aspart  0-5 Units Subcutaneous QHS  . insulin aspart  0-9 Units Subcutaneous TID WC  . multivitamin  1 tablet Oral QHS     acetaminophen **OR** acetaminophen, morphine injection, oxyCODONE-acetaminophen  Exam: General appearance: alert and cooperative Neck: no adenopathy, no jvd Resp: clear to auscultation bilaterally Cardio: regular rate and rhythm, S1, S2 normal, no murmur, click, rub or gallop GI: soft, non-tender; bowel sounds normal; no masses,  no organomegaly Ext: L upper arm w Ace wrap (open wound underneath per pt but dressing not removed). L forearm no erythema/ induration.  AVG loop +bruit. Neuro: nf, Ox 3, pleasant   Assessment/Plan: 60 year old male with ESRD presenting with infection of AVG Assessment: 1 Removal infected stent L arm - tender AV graft after intervention. AV graft had required 3 prior interventions over the last 6 months. Blood cultures growing MSSA. SP revision AVG w interposition of new 91mm PTFE graft and removal of infected stent    2 Hypertension: would resume lisinopril and coreg at dc, BP's going up w infection controlled 4. Anemia of ESRD: Hemoglobin had been 12 as an outpatient - received Mircera this week.  5. Metabolic Bone Disease: Will continue meds  Plan - For dc today, will continue IV abx at HD unit per ID  rec's.   Kelly Splinter MD [facility] pager [phone]    cell [phone] 09/01/2015, 1:28 PM    Recent Labs Lab 08/30/15 0600 08/31/15 1119 09/01/15 0453  NA 136 132* 135  K 4.3 4.5 3.9  CL 93* 93* 96*  CO2 27 25 27   GLUCOSE 65 148* 153*  BUN 50* 57* 40*  CREATININE 10.96* 12.94* 9.92*  CALCIUM 8.5* 8.4* 8.5*  PHOS 3.6  --   --     Recent Labs Lab 08/29/15 1010 08/30/15 0600  AST 25  --   ALT 20  --   ALKPHOS 65  --   BILITOT 0.6  --   PROT 7.8  --   ALBUMIN 3.6 2.9*    Recent Labs Lab 08/29/15 1010 08/30/15 0600 08/31/15 2321 09/01/15 0453  WBC 15.7* 15.5* 7.8 7.7  NEUTROABS 14.0*  --   --   --   HGB 11.4* 10.6* 11.8* 9.0*  HCT 34.7* 32.4* 35.1* 27.0*  MCV 94.6 93.9 90.5 91.2  PLT 171 152 140* 156   Iron/TIBC/Ferritin/ %Sat    Component Value Date/Time   IRON 21 (L) 08/27/2014 0345   TIBC 227 (L) 08/27/2014 0345   FERRITIN 118 08/27/2014 0345   IRONPCTSAT 9 (L) 08/27/2014 0345

## 2015-09-01 NOTE — Progress Notes (Signed)
Subjective: Currently, the pt has no complaints. Ready for discharge.  Interval Events: Taken to OR for infected stent removal and graft revision. Dialyzed yesterday. ID/Nephro/Vascular following.  Objective: Vital signs in last 24 hours: Vitals:   09/01/15 0300 09/01/15 0310 09/01/15 0319 09/01/15 0458  BP: 100/65 105/67 (!) 107/59 (!) 107/57  Pulse: 78 77 86 78  Resp: 18 18 19 20   Temp:  97.7 F (36.5 C) 97.6 F (36.4 C) 98.4 F (36.9 C)  TempSrc:  Oral Oral Oral  SpO2:  100% 100% 100%  Weight:  173 lb 1 oz (78.5 kg)     Physical Exam: Physical Exam  Constitutional: No distress.  Cardiovascular: Normal rate, regular rhythm and normal heart sounds.   Pulmonary/Chest: Effort normal and breath sounds normal.  Abdominal: Soft. Bowel sounds are normal. There is no tenderness.  Musculoskeletal: He exhibits no edema.  Pt has dialysis vascular access graft below the ante-cubital fossa. Recent surgical bandage present w/o evidence of drainage or TTP  Skin: Skin is warm. He is not diaphoretic.   Labs: CBC:  Recent Labs Lab 08/29/15 1010 08/30/15 0600 08/31/15 2321 09/01/15 0453  WBC 15.7* 15.5* 7.8 7.7  NEUTROABS 14.0*  --   --   --   HGB 11.4* 10.6* 11.8* 9.0*  HCT 34.7* 32.4* 35.1* 27.0*  MCV 94.6 93.9 90.5 91.2  PLT 171 152 [id number]* [id number]   Metabolic Panel:  Recent Labs Lab 08/29/15 1010 08/30/15 0600 08/31/15 1119 09/01/15 0453  NA 135 136 132* 135  K 3.4* 4.3 4.5 3.9  CL 95* 93* 93* 96*  CO2 26 27 25 27   GLUCOSE 62* 65 148* 153*  BUN 33* 50* 57* 40*  CREATININE 9.49* 10.96* 12.94* 9.92*  CALCIUM 8.6* 8.5* 8.4* 8.5*  PHOS  --  3.6  --   --   ALT 20  --   --   --   ALKPHOS 65  --   --   --   BILITOT 0.6  --   --   --   PROT 7.8  --   --   --   ALBUMIN 3.6 2.9*  --   --   BG:  Recent Labs Lab 08/30/15 2114 08/31/15 1025 08/31/15 1125 08/31/15 1659 08/31/15 2019  GLUCAP 173* 149* 136* 194* 162*    Lab Results  Component Value Date   HGBA1C 7.6  (H) 08/20/2014   Microbiology: BCx 2/2 + MSSA Post-op BCx pending UCx NGTD   Medications:   Scheduled Medications: . atorvastatin  40 mg Oral q1800  .  ceFAZolin (ANCEF) IV  2 g Intravenous Q T,Th,Sat-1800  . doxercalciferol  5 mcg Intravenous Q T,Th,Sa-HD  . heparin  5,000 Units Subcutaneous Q8H  . insulin aspart  0-5 Units Subcutaneous QHS  . insulin aspart  0-9 Units Subcutaneous TID WC  . multivitamin  1 tablet Oral QHS   PRN Medications: acetaminophen **OR** acetaminophen, morphine injection, oxyCODONE-acetaminophen  Assessment/Plan: Pt is a 60 y.o. yo male with a PMHx of ESRD on HD, multiple AV graft clots, HTN, HLD, acute HF w/ preserved EF who was admitted on 08/29/2015 with symptoms of fatigue, fever, erythema around his graft, which was determined to be secondary to graft infection and bacteremia. Interventions at this time will be focused on abx therapy.   1) MSSA Bacteremia 2/2 stent infection: Afebrile w/ normal WBC. Stent removed operatively yesterday. Repeat BCx NGx1D - Cont cefazolin 2g IV plan 3-4 wks, can be dosed at dialysis - f/u  post-op BCx in 48hr - f/u w/ vasc sug outpt for graft care  2) ESRD on hemodialysis: Pt with currently in transition from T/Th/S dialysis to M/W/F dialysis at new center. He received full session yesterday (08/28/15), but did not begin his session today (08/29/15) d/t concern for infection. - Nephrology c/s, appreciate recs                       - dialyzed ON  3) HTN: Well controlled on home meds lisinopril 20mg  qD, carvedilol 12.5mg  BID. Pt is currently feeling well w/o meds, normal BPs. - hold home meds - monitor for symptoms of low BP  4) DM: Last A1c 7.6, controlled with diet and glimepiride 2mg  qD. - hold glimepiride - SSI-s    Length of Stay: 2 day(s) Dispo: Anticipated discharge in approximately 1-2 day(s).  Holley Raring, MD Pager: [phone] (7AM-5PM) 09/01/2015, 6:38 AM

## 2015-09-01 NOTE — Progress Notes (Signed)
  Date: 09/01/2015  Patient name: Kyle [name]  Medical record number: [id number]  Date of birth: 1955/10/26   This patient has been seen and the plan of care was discussed with the house staff. Please see their note for complete details. I concur with their findings with the following additions/corrections: [name] was seen on AM rounds. He has MSSA bacteremia from infected graft stent, s/p rermoval, He will be on Ancef with HD. Repeat blood cxs pending. Stable for D/C home.  Bartholomew Crews, MD 09/01/2015, 3:55 PM

## 2015-09-02 ENCOUNTER — Telehealth: Payer: Self-pay | Admitting: Vascular Surgery

## 2015-09-02 ENCOUNTER — Other Ambulatory Visit: Payer: Self-pay | Admitting: Internal Medicine

## 2015-09-02 LAB — AEROBIC CULTURE  (SUPERFICIAL SPECIMEN)

## 2015-09-02 LAB — AEROBIC CULTURE W GRAM STAIN (SUPERFICIAL SPECIMEN)

## 2015-09-02 NOTE — Telephone Encounter (Signed)
-----   Message from Denman George, RN sent at 09/01/2015  4:30 PM EDT ----- Regarding: needs 2 wk. f/u with [facility]   ----- Message ----- From: Alvia Grove, PA-C Sent: 09/01/2015   4:09 PM To: [facility] Charge Pool  S/p Revision of left FA AVG and removal of infected stent POD 1  Please schedule 2 week f/u with [facility].   Thanks Maudie Mercury

## 2015-09-02 NOTE — Telephone Encounter (Signed)
Resched appt 8/16 at 2:30. Other #'s had no vm, lm on cell# to inform pt

## 2015-09-02 NOTE — Telephone Encounter (Signed)
Sched appt 8/23 at 1:15. Spoke to pt to inform them of appt. Pt may call back to resch.

## 2015-09-02 NOTE — Telephone Encounter (Signed)
-----   Message from Denman George, RN sent at 09/02/2015 10:37 AM EDT ----- Regarding: RE: change f/u with [facility] to one week to check wound left arm  Yes, please; based on [facility]'s new message (previous f/u recommendation @ 2 weeks was from the PA)  ----- Message ----- From: Georgiann Mccoy Sent: 09/02/2015  10:30 AM To: Joline Salt Pullins, RN Subject: RE: change f/u with [facility] to one week to check#  I had previously gotten a staff message to f/u in 2 w and have already scheduled it with the pt. Should I still change it to 1 week?  ----- Message ----- From: Denman George, RN Sent: 09/02/2015  10:24 AM To: [facility] Admin Pool Subject: change f/u with [facility] to one week to check wou#    ----- Message ----- From: Angelia Mould, MD Sent: 09/02/2015   7:43 AM To: [facility] Charge Pool Subject: f/u                                            This patient was discharged. He will need a f/u visit in 1 week to check on his left arm open wound where I removed an infected stent. CD

## 2015-09-04 ENCOUNTER — Encounter: Payer: Self-pay | Admitting: Vascular Surgery

## 2015-09-04 LAB — CULTURE, BLOOD (ROUTINE X 2)
CULTURE: NO GROWTH
Culture: NO GROWTH

## 2015-09-05 LAB — CULTURE, BLOOD (ROUTINE X 2)
Culture: NO GROWTH
Culture: NO GROWTH

## 2015-09-05 LAB — ANAEROBIC CULTURE

## 2015-09-06 LAB — CULTURE, BLOOD (ROUTINE X 2)
Culture: NO GROWTH
Culture: NO GROWTH

## 2015-09-08 ENCOUNTER — Ambulatory Visit (HOSPITAL_COMMUNITY): Payer: BLUE CROSS/BLUE SHIELD | Admitting: Certified Registered Nurse Anesthetist

## 2015-09-08 ENCOUNTER — Ambulatory Visit (HOSPITAL_COMMUNITY): Payer: BLUE CROSS/BLUE SHIELD

## 2015-09-08 ENCOUNTER — Encounter (HOSPITAL_COMMUNITY): Payer: Self-pay | Admitting: *Deleted

## 2015-09-08 ENCOUNTER — Other Ambulatory Visit: Payer: Self-pay

## 2015-09-08 ENCOUNTER — Ambulatory Visit (HOSPITAL_COMMUNITY)
Admission: RE | Admit: 2015-09-08 | Discharge: 2015-09-08 | Disposition: A | Discharge status: Home / Self Care | Payer: BLUE CROSS/BLUE SHIELD | Source: Ambulatory Visit | Attending: Vascular Surgery | Admitting: Vascular Surgery

## 2015-09-08 ENCOUNTER — Encounter (HOSPITAL_COMMUNITY)
Admission: RE | Disposition: A | Discharge status: Home / Self Care | Payer: Self-pay | Source: Ambulatory Visit | Attending: Vascular Surgery

## 2015-09-08 DIAGNOSIS — Z87891 Personal history of nicotine dependence: Secondary | ICD-10-CM | POA: Insufficient documentation

## 2015-09-08 DIAGNOSIS — E669 Obesity, unspecified: Secondary | ICD-10-CM | POA: Insufficient documentation

## 2015-09-08 DIAGNOSIS — E1122 Type 2 diabetes mellitus with diabetic chronic kidney disease: Secondary | ICD-10-CM | POA: Diagnosis not present

## 2015-09-08 DIAGNOSIS — E785 Hyperlipidemia, unspecified: Secondary | ICD-10-CM | POA: Insufficient documentation

## 2015-09-08 DIAGNOSIS — Z992 Dependence on renal dialysis: Secondary | ICD-10-CM | POA: Insufficient documentation

## 2015-09-08 DIAGNOSIS — I132 Hypertensive heart and chronic kidney disease with heart failure and with stage 5 chronic kidney disease, or end stage renal disease: Secondary | ICD-10-CM | POA: Diagnosis not present

## 2015-09-08 DIAGNOSIS — I509 Heart failure, unspecified: Secondary | ICD-10-CM | POA: Insufficient documentation

## 2015-09-08 DIAGNOSIS — Z792 Long term (current) use of antibiotics: Secondary | ICD-10-CM | POA: Insufficient documentation

## 2015-09-08 DIAGNOSIS — Z7982 Long term (current) use of aspirin: Secondary | ICD-10-CM | POA: Diagnosis not present

## 2015-09-08 DIAGNOSIS — N186 End stage renal disease: Secondary | ICD-10-CM | POA: Diagnosis present

## 2015-09-08 DIAGNOSIS — I2510 Atherosclerotic heart disease of native coronary artery without angina pectoris: Secondary | ICD-10-CM | POA: Diagnosis not present

## 2015-09-08 DIAGNOSIS — Z6822 Body mass index (BMI) 22.0-22.9, adult: Secondary | ICD-10-CM | POA: Insufficient documentation

## 2015-09-08 DIAGNOSIS — Z8674 Personal history of sudden cardiac arrest: Secondary | ICD-10-CM | POA: Insufficient documentation

## 2015-09-08 DIAGNOSIS — Z79899 Other long term (current) drug therapy: Secondary | ICD-10-CM | POA: Insufficient documentation

## 2015-09-08 DIAGNOSIS — Z419 Encounter for procedure for purposes other than remedying health state, unspecified: Secondary | ICD-10-CM

## 2015-09-08 DIAGNOSIS — Z452 Encounter for adjustment and management of vascular access device: Secondary | ICD-10-CM

## 2015-09-08 DIAGNOSIS — Z7984 Long term (current) use of oral hypoglycemic drugs: Secondary | ICD-10-CM | POA: Diagnosis not present

## 2015-09-08 HISTORY — PX: INSERTION OF DIALYSIS CATHETER: SHX1324

## 2015-09-08 LAB — POCT I-STAT 4, (NA,K, GLUC, HGB,HCT)
Glucose, Bld: 115 mg/dL — ABNORMAL HIGH (ref 65–99)
HCT: 31 % — ABNORMAL LOW (ref 39.0–52.0)
Hemoglobin: 10.5 g/dL — ABNORMAL LOW (ref 13.0–17.0)
POTASSIUM: 4.2 mmol/L (ref 3.5–5.1)
Sodium: 138 mmol/L (ref 135–145)

## 2015-09-08 SURGERY — INSERTION OF DIALYSIS CATHETER
Anesthesia: Monitor Anesthesia Care | Site: Neck

## 2015-09-08 MED ORDER — FENTANYL CITRATE (PF) 250 MCG/5ML IJ SOLN
INTRAMUSCULAR | Status: AC
  Filled 2015-09-08: qty 5

## 2015-09-08 MED ORDER — ONDANSETRON HCL 4 MG/2ML IJ SOLN
INTRAMUSCULAR | Status: DC | PRN
  Administered 2015-09-08: 4 mg via INTRAVENOUS

## 2015-09-08 MED ORDER — FENTANYL CITRATE (PF) 100 MCG/2ML IJ SOLN: 25.0000 ug | INTRAMUSCULAR | Status: DC | PRN

## 2015-09-08 MED ORDER — MIDAZOLAM HCL 5 MG/5ML IJ SOLN
INTRAMUSCULAR | Status: DC | PRN
  Administered 2015-09-08 (×2): 1 mg via INTRAVENOUS

## 2015-09-08 MED ORDER — PROMETHAZINE HCL 25 MG/ML IJ SOLN: 6.2500 mg | INTRAMUSCULAR | Status: DC | PRN

## 2015-09-08 MED ORDER — SODIUM CHLORIDE 0.9 % IV SOLN
INTRAVENOUS | Status: DC | PRN
  Administered 2015-09-08: 500 mL

## 2015-09-08 MED ORDER — HEPARIN SODIUM (PORCINE) 1000 UNIT/ML IJ SOLN
INTRAMUSCULAR | Status: AC
  Filled 2015-09-08: qty 1

## 2015-09-08 MED ORDER — SODIUM CHLORIDE 0.9 % IV SOLN
INTRAVENOUS | Status: DC | PRN
  Administered 2015-09-08: 14:00:00 via INTRAVENOUS

## 2015-09-08 MED ORDER — DEXTROSE 5 % IV SOLN
1.5000 g | INTRAVENOUS | Status: AC
  Administered 2015-09-08: 1.5 g via INTRAVENOUS

## 2015-09-08 MED ORDER — 0.9 % SODIUM CHLORIDE (POUR BTL) OPTIME
TOPICAL | Status: DC | PRN
  Administered 2015-09-08: 1000 mL

## 2015-09-08 MED ORDER — LIDOCAINE-EPINEPHRINE 0.5 %-1:200000 IJ SOLN
INTRAMUSCULAR | Status: DC | PRN
  Administered 2015-09-08: 30 mL

## 2015-09-08 MED ORDER — MIDAZOLAM HCL 2 MG/2ML IJ SOLN
INTRAMUSCULAR | Status: AC
  Filled 2015-09-08: qty 2

## 2015-09-08 MED ORDER — SODIUM CHLORIDE 0.9 % IV SOLN
INTRAVENOUS | Status: DC
  Administered 2015-09-08: 14:00:00 via INTRAVENOUS

## 2015-09-08 MED ORDER — HEPARIN SODIUM (PORCINE) 1000 UNIT/ML IJ SOLN
INTRAMUSCULAR | Status: DC | PRN
  Administered 2015-09-08: 3.4 mL

## 2015-09-08 MED ORDER — PROPOFOL 500 MG/50ML IV EMUL
INTRAVENOUS | Status: DC | PRN
  Administered 2015-09-08: 75 ug/kg/min via INTRAVENOUS

## 2015-09-08 MED ORDER — CARVEDILOL 12.5 MG PO TABS
ORAL_TABLET | ORAL | Status: AC
  Administered 2015-09-08: 12.5 mg via ORAL
  Filled 2015-09-08: qty 1

## 2015-09-08 MED ORDER — FENTANYL CITRATE (PF) 100 MCG/2ML IJ SOLN
INTRAMUSCULAR | Status: DC | PRN
  Administered 2015-09-08: 25 ug via INTRAVENOUS

## 2015-09-08 MED ORDER — CARVEDILOL 12.5 MG PO TABS
12.5000 mg | ORAL_TABLET | Freq: Once | ORAL | Status: AC
  Administered 2015-09-08: 12.5 mg via ORAL
  Filled 2015-09-08: qty 1

## 2015-09-08 MED ORDER — DEXTROSE 5 % IV SOLN
INTRAVENOUS | Status: AC
  Filled 2015-09-08: qty 1.5

## 2015-09-08 MED ORDER — CHLORHEXIDINE GLUCONATE CLOTH 2 % EX PADS: 6.0000 | MEDICATED_PAD | Freq: Once | CUTANEOUS | Status: DC

## 2015-09-08 SURGICAL SUPPLY — 44 items
APL SKNCLS STERI-STRIP NONHPOA (GAUZE/BANDAGES/DRESSINGS) ×1
BAG DECANTER FOR FLEXI CONT (MISCELLANEOUS) ×3 IMPLANT
BENZOIN TINCTURE PRP APPL 2/3 (GAUZE/BANDAGES/DRESSINGS) ×2 IMPLANT
BIOPATCH RED 1 DISK 7.0 (GAUZE/BANDAGES/DRESSINGS) ×2 IMPLANT
BIOPATCH RED 1IN DISK 7.0MM (GAUZE/BANDAGES/DRESSINGS) ×1
CATH PALINDROME RT-P 15FX19CM (CATHETERS) IMPLANT
CATH PALINDROME RT-P 15FX23CM (CATHETERS) ×2 IMPLANT
CATH PALINDROME RT-P 15FX28CM (CATHETERS) IMPLANT
CATH PALINDROME RT-P 15FX55CM (CATHETERS) IMPLANT
CLOSURE STERI-STRIP 1/2X4 (GAUZE/BANDAGES/DRESSINGS) ×1
CLOSURE WOUND 1/2 X4 (GAUZE/BANDAGES/DRESSINGS)
CLSR STERI-STRIP ANTIMIC 1/2X4 (GAUZE/BANDAGES/DRESSINGS) ×2 IMPLANT
COVER PROBE W GEL 5X96 (DRAPES) IMPLANT
DECANTER SPIKE VIAL GLASS SM (MISCELLANEOUS) ×3 IMPLANT
DRAPE C-ARM 42X72 X-RAY (DRAPES) ×3 IMPLANT
DRAPE CHEST BREAST 15X10 FENES (DRAPES) ×3 IMPLANT
GAUZE SPONGE 4X4 16PLY XRAY LF (GAUZE/BANDAGES/DRESSINGS) ×3 IMPLANT
GLOVE SS BIOGEL STRL SZ 7.5 (GLOVE) ×1 IMPLANT
GLOVE SUPERSENSE BIOGEL SZ 7.5 (GLOVE) ×2
GLOVE SURG SS PI 7.0 STRL IVOR (GLOVE) ×4 IMPLANT
GOWN STRL REUS W/ TWL LRG LVL3 (GOWN DISPOSABLE) ×2 IMPLANT
GOWN STRL REUS W/ TWL XL LVL3 (GOWN DISPOSABLE) IMPLANT
GOWN STRL REUS W/TWL LRG LVL3 (GOWN DISPOSABLE) ×6
GOWN STRL REUS W/TWL XL LVL3 (GOWN DISPOSABLE) ×6
KIT BASIN OR (CUSTOM PROCEDURE TRAY) ×3 IMPLANT
KIT ROOM TURNOVER OR (KITS) ×3 IMPLANT
NDL 18GX1X1/2 (RX/OR ONLY) (NEEDLE) ×1 IMPLANT
NDL HYPO 25GX1X1/2 BEV (NEEDLE) ×1 IMPLANT
NEEDLE 18GX1X1/2 (RX/OR ONLY) (NEEDLE) ×3 IMPLANT
NEEDLE 22X1 1/2 (OR ONLY) (NEEDLE) ×3 IMPLANT
NEEDLE HYPO 25GX1X1/2 BEV (NEEDLE) ×3 IMPLANT
NS IRRIG 1000ML POUR BTL (IV SOLUTION) ×3 IMPLANT
PACK SURGICAL SETUP 50X90 (CUSTOM PROCEDURE TRAY) ×3 IMPLANT
PAD ARMBOARD 7.5X6 YLW CONV (MISCELLANEOUS) ×6 IMPLANT
SOAP 2 % CHG 4 OZ (WOUND CARE) ×3 IMPLANT
SPONGE GAUZE 4X4 12PLY STER LF (GAUZE/BANDAGES/DRESSINGS) ×2 IMPLANT
STRIP CLOSURE SKIN 1/2X4 (GAUZE/BANDAGES/DRESSINGS) IMPLANT
SUT ETHILON 3 0 PS 1 (SUTURE) ×3 IMPLANT
SUT VICRYL 4-0 PS2 18IN ABS (SUTURE) ×3 IMPLANT
SYR 20CC LL (SYRINGE) ×3 IMPLANT
SYR 5ML LL (SYRINGE) ×6 IMPLANT
SYR CONTROL 10ML LL (SYRINGE) ×3 IMPLANT
SYRINGE 10CC LL (SYRINGE) ×3 IMPLANT
WATER STERILE IRR 1000ML POUR (IV SOLUTION) ×3 IMPLANT

## 2015-09-08 NOTE — Anesthesia Postprocedure Evaluation (Signed)
Anesthesia Post Note  Patient: Kyle [name]  Procedure(s) Performed: Procedure(s) (LRB): INSERTION OF DIALYSIS CATHETER RIGHT INTERNAL JUGULAR (N/A)  Patient location during evaluation: PACU Anesthesia Type: MAC Level of consciousness: awake and alert Pain management: pain level controlled Vital Signs Assessment: post-procedure vital signs reviewed and stable Respiratory status: spontaneous breathing, nonlabored ventilation, respiratory function stable and patient connected to nasal cannula oxygen Cardiovascular status: stable and blood pressure returned to baseline Anesthetic complications: no    Last Vitals:  Vitals:   09/08/15 1742 09/08/15 1745  BP: (!) 169/87 (!) 155/89  Pulse: 67 65  Resp: 17 (!) 21  Temp:      Last Pain:  Vitals:   09/08/15 1419  TempSrc: Oral                 Loreto Loescher DANIEL

## 2015-09-08 NOTE — Op Note (Signed)
    OPERATIVE REPORT  DATE OF SURGERY: 09/08/2015  PATIENT: Kyle [name], 60 y.o. male MRN: [id number]  DOB: 09-20-1955  PRE-OPERATIVE DIAGNOSIS: End Stage renal disease  POST-OPERATIVE DIAGNOSIS:  Same  PROCEDURE: Right IJ hemodialysis catheter SonoSite visualization  SURGEON:  Curt Jews, M.D.  PHYSICIAN ASSISTANT: Nurse  ANESTHESIA:  Local with sedation  EBL: Minimal ml  Total I/O In: 200 [I.V.:200] Out: 10 [Blood:10]  BLOOD ADMINISTERED: None  DRAINS: None  SPECIMEN: None  COUNTS CORRECT:  YES  PLAN OF CARE: PACU   PATIENT DISPOSITION:  PACU - hemodynamically stable  PROCEDURE DETAILS: This was taken to the operative placed supine position where the area of the right neck was imaged with SonoSite ultrasound. This revealed widely patent right internal jugular vein. [name] right and left neck and chest prepped and draped in usual sterile fashion the patient was placed in Trendelenburg position. Local anesthesia and SonoSite visualization 18-gauge needle entered the right internal jugular vein at the base of the neck and a guidewire passed down to the level the right atrium. This was confirmed with fluoroscopy. A sequential dilator was used to dilate in place a peel-away sheath over the guidewire. The guidewire and dilator were removed. A 28 cm tunneled catheter was positioned through the peel-away sheath which was then removed as well. The distal tip of the catheter was positioned in the distal right atrium. The catheter was brought out through separate stab incision with the cuff under the tunnel. The 2 lm ports were attached in both lumens flushed and aspirated easily and were locked with 1000 unit per cc heparin. The catheter was secured to the skin with a 3-0 nylon stitch and the entry site was closed with a 4-0 subcuticular Vicryl stitch. Sterile dressing was applied and the patient was transferred to the recovery room in stable condition with chest x-ray  pending   Curt Jews, M.D. 09/08/2015 5:20 PM

## 2015-09-08 NOTE — Transfer of Care (Signed)
Immediate Anesthesia Transfer of Care Note  Patient: Kyle [name]  Procedure(s) Performed: Procedure(s): INSERTION OF DIALYSIS CATHETER RIGHT INTERNAL JUGULAR (N/A)  Patient Location: PACU  Anesthesia Type:MAC  Level of Consciousness: awake, alert  and oriented  Airway & Oxygen Therapy: Patient Spontanous Breathing  Post-op Assessment: Report given to RN, Post -op Vital signs reviewed and stable and Patient moving all extremities X 4  Post vital signs: Reviewed and stable  Last Vitals:  Vitals:   09/08/15 1419  BP: (!) 189/91  Pulse: 64  Resp: 18  Temp: 36.7 C    Last Pain:  Vitals:   09/08/15 1419  TempSrc: Oral         Complications: No apparent anesthesia complications

## 2015-09-08 NOTE — Anesthesia Procedure Notes (Signed)
Procedure Name: MAC Date/Time: 09/08/2015 4:04 PM Performed by: Garrison Columbus T Pre-anesthesia Checklist: Patient identified, Emergency Drugs available, Suction available and Patient being monitored Patient Re-evaluated:Patient Re-evaluated prior to inductionOxygen Delivery Method: Simple face mask Preoxygenation: Pre-oxygenation with 100% oxygen Intubation Type: IV induction Placement Confirmation: positive ETCO2 and breath sounds checked- equal and bilateral Dental Injury: Teeth and Oropharynx as per pre-operative assessment

## 2015-09-08 NOTE — Anesthesia Preprocedure Evaluation (Addendum)
Anesthesia Evaluation  Patient identified by MRN, date of birth, ID band Patient awake    Reviewed: Allergy & Precautions, NPO status , Patient's Chart, lab work & pertinent test results, reviewed documented beta blocker date and time   History of Anesthesia Complications Negative for: history of anesthetic complications  Airway Mallampati: III  TM Distance: >3 FB Neck ROM: Full    Dental  (+) Dental Advisory Given, Poor Dentition, Missing,    Pulmonary neg shortness of breath, neg sleep apnea, neg COPD, neg recent URI, former smoker,    Pulmonary exam normal breath sounds clear to auscultation       Cardiovascular hypertension, Pt. on home beta blockers and Pt. on medications (-) angina+ CAD and +CHF  (-) Past MI, (-) Cardiac Stents, (-) CABG, (-) Orthopnea and (-) PND  Rhythm:Regular Rate:Normal  Cardiac arrest in 07/2014   Neuro/Psych negative neurological ROS     GI/Hepatic negative GI ROS, Neg liver ROS,   Endo/Other  diabetes, Type 2, Oral Hypoglycemic Agents  Renal/GU ESRF and DialysisRenal disease     Musculoskeletal   Abdominal (+) - obese,   Peds  Hematology  (+) Blood dyscrasia, anemia ,   Anesthesia Other Findings HLD  Reproductive/Obstetrics                           Anesthesia Physical Anesthesia Plan  ASA: IV  Anesthesia Plan: MAC   Post-op Pain Management:    Induction: Intravenous  Airway Management Planned: Natural Airway and Simple Face Mask  Additional Equipment:   Intra-op Plan:   Post-operative Plan:   Informed Consent: I have reviewed the patients History and Physical, chart, labs and discussed the procedure including the risks, benefits and alternatives for the proposed anesthesia with the patient or authorized representative who has indicated his/her understanding and acceptance.   Dental advisory given  Plan Discussed with:   Anesthesia Plan  Comments:        Anesthesia Quick Evaluation

## 2015-09-09 ENCOUNTER — Encounter (HOSPITAL_COMMUNITY): Payer: Self-pay | Admitting: Vascular Surgery

## 2015-09-10 ENCOUNTER — Encounter: Payer: BLUE CROSS/BLUE SHIELD | Admitting: Vascular Surgery

## 2015-09-10 ENCOUNTER — Telehealth: Payer: Self-pay | Admitting: Vascular Surgery

## 2015-09-10 NOTE — Telephone Encounter (Addendum)
-----   Message from Mena Goes, RN sent at 09/09/2015 11:17 PM EDT ----- Regarding: Here is your answer on [place]  Sent already to [email or web address] but here tis again!  ----- Message ----- From: Rosetta Posner, MD Sent: 09/08/2015   5:25 PM To: [facility] Charge Pool  Had tunneled catheter placed with SonoSite visualization. Needs office follow-up in 1-2 weeks regarding his recent revision to his left arm and future access planning. Had surgery with Dr. Scot Dock and so should follow-up with him. If this will not work from a dialysis scheduling standpoint can follow-up with me.  I scheduled an appt for the patient to see TFE on 10/07/15 for follow up. I left a VM message for him also. awt

## 2015-09-17 ENCOUNTER — Encounter: Payer: BLUE CROSS/BLUE SHIELD | Admitting: Vascular Surgery

## 2015-09-17 NOTE — H&P (Signed)
Patient name: Kyle [name] MRN: [id number] DOB: 08/29/1955 Sex: male    Reason for referral: ESRD  HISTORY OF PRESENT ILLNESS: Admitted today for outpatient catheter placement after recent removal of hemodialysis catheter for presumed infection  Past Medical History:  Diagnosis Date  . CAD (coronary artery disease)   . Cardiac arrest ([place])    07/2014-[facility] initiated  . Diabetes mellitus without complication ([place])   . ESRD (end stage renal disease) ([place])    [facility] M/W/F  . Hypertension   . Staphylococcus aureus bacteremia 08/29/2015    Past Surgical History:  Procedure Laterality Date  . AV FISTULA PLACEMENT Left 09/11/2014   Procedure: INSERTION OF LEFT ARM 6MM ARTERIOVENOUS GORTEX GRAFT ;  Surgeon: Elam Dutch, MD;  Location: [facility];  Service: Vascular;  Laterality: Left;  . CARDIAC CATHETERIZATION N/A 09/04/2014   Procedure: Right/Left Heart Cath and Coronary Angiography;  Surgeon: Peter M Martinique, MD;  Location: [facility];  Service: Cardiovascular;  Laterality: N/A;  . EXTERNAL EAR SURGERY    . INSERTION OF DIALYSIS CATHETER N/A 08/21/2014   Procedure: INSERTION OF DIALYSIS CATHETER RIGHT INTERNAL JUGULAR VEIN;  Surgeon: Conrad Jordan, MD;  Location: [facility];  Service: Vascular;  Laterality: N/A;  MAC to General  . INSERTION OF DIALYSIS CATHETER N/A 09/08/2015   Procedure: INSERTION OF DIALYSIS CATHETER RIGHT INTERNAL JUGULAR;  Surgeon: Rosetta Posner, MD;  Location: [facility];  Service: Vascular;  Laterality: N/A;  . REVISION OF ARTERIOVENOUS GORETEX GRAFT Left 08/31/2015   Procedure: REVISION OF LEFT ARTERIOVENOUS GORETEX GRAFT USING  AN INTERPOSITION  4-7MM GORTEX GRAFT WITH REMOVAL OF INFECTED STENT;  INTRAOPERATIVE ARTERIOGRAM TIMES ONE.;  Surgeon: Angelia Mould, MD;  Location: [facility];  Service: Vascular;  Laterality: Left;  . TONSILLECTOMY      Social History   Social History  . Marital status: Married    Spouse name: N/A  . Number of children:  N/A  . Years of education: N/A   Occupational History  . Not on file.   Social History Main Topics  . Smoking status: Former Smoker    Types: Cigarettes  . Smokeless tobacco: Never Used  . Alcohol use No  . Drug use: No  . Sexual activity: Not on file   Other Topics Concern  . Not on file   Social History Narrative  . No narrative on file    Family History  Problem Relation Age of Onset  . Heart failure Mother   . Diabetes Mother   . Hypertension Mother   . Diabetes Father     Allergies as of 09/08/2015  . (No Known Allergies)    No current facility-administered medications on file prior to encounter.    Current Outpatient Prescriptions on File Prior to Encounter  Medication Sig Dispense Refill  . aspirin 81 MG tablet Take 81 mg by mouth daily.    Marland Kitchen atorvastatin (LIPITOR) 40 MG tablet Take 1 tablet (40 mg total) by mouth daily. 30 tablet 11  . B Complex-C-Folic Acid (RENA-VITE RX) 1 MG TABS Take 1 tablet by mouth daily.  6  . carvedilol (COREG) 12.5 MG tablet Take 1 tablet (12.5 mg total) by mouth 2 (two) times daily with a meal. 180 tablet 3  . ceFAZolin (ANCEF) 2-4 GM/100ML-% IVPB Inject 100 mLs (2 g total) into the vein every Tuesday, Thursday, and Saturday at 6 PM. 1 each 18  . doxercalciferol (HECTOROL) 4 MCG/2ML injection Inject 2.5 mLs (  5 mcg total) into the vein Every Tuesday,Thursday,and Saturday with dialysis. 2 mL 12  . glimepiride (AMARYL) 2 MG tablet Take 1 tablet (2 mg total) by mouth daily with breakfast. 30 tablet 1  . lisinopril (PRINIVIL,ZESTRIL) 20 MG tablet Take 20 mg by mouth daily.   6  . rifampin (RIFADIN) 300 MG capsule Take 2 capsules (600 mg total) by mouth daily. 60 capsule 3      PHYSICAL EXAMINATION:  General: The patient is a well-nourished male, in no acute distress. Vital signs are BP (!) 178/91   Pulse 63   Temp 98.1 F (36.7 C)   Resp 20   Ht 6\' 1"  (1.854 m)   Wt 173 lb 1 oz (78.5 kg)   SpO2 99%   BMI 22.83 kg/m    Pulmonary: There is a good air exchange  Musculoskeletal: There are no major deformities.  There is no significant extremity pain. Neurologic: No focal weakness or paresthesias are detected, Skin: There are no ulcer or rashes noted. Psychiatric: The patient has normal affect. Cardiovascular: There is a regular rate and rhythm without significant murmur appreciated.     Impression and Plan:  End-stage renal disease for placement of hemodialysis catheters an outpatient    Pria Klosinski [facility] Office: [phone]

## 2015-09-24 ENCOUNTER — Ambulatory Visit: Payer: BLUE CROSS/BLUE SHIELD | Admitting: Vascular Surgery

## 2015-10-02 ENCOUNTER — Encounter: Payer: Self-pay | Admitting: Vascular Surgery

## 2015-10-06 ENCOUNTER — Ambulatory Visit (INDEPENDENT_AMBULATORY_CARE_PROVIDER_SITE_OTHER): Payer: BLUE CROSS/BLUE SHIELD | Admitting: Infectious Disease

## 2015-10-06 ENCOUNTER — Encounter: Payer: Self-pay | Admitting: Infectious Disease

## 2015-10-06 VITALS — BP 108/70 | HR 89 | Temp 98.3°F | Ht 72.0 in | Wt 176.0 lb

## 2015-10-06 DIAGNOSIS — B9561 Methicillin susceptible Staphylococcus aureus infection as the cause of diseases classified elsewhere: Secondary | ICD-10-CM | POA: Diagnosis not present

## 2015-10-06 DIAGNOSIS — T827XXD Infection and inflammatory reaction due to other cardiac and vascular devices, implants and grafts, subsequent encounter: Secondary | ICD-10-CM | POA: Diagnosis not present

## 2015-10-06 DIAGNOSIS — N186 End stage renal disease: Secondary | ICD-10-CM

## 2015-10-06 DIAGNOSIS — R7881 Bacteremia: Secondary | ICD-10-CM

## 2015-10-06 DIAGNOSIS — T827XXA Infection and inflammatory reaction due to other cardiac and vascular devices, implants and grafts, initial encounter: Secondary | ICD-10-CM

## 2015-10-06 DIAGNOSIS — Z992 Dependence on renal dialysis: Secondary | ICD-10-CM

## 2015-10-06 HISTORY — DX: Infection and inflammatory reaction due to other cardiac and vascular devices, implants and grafts, initial encounter: T82.7XXA

## 2015-10-06 MED ORDER — CEPHALEXIN 500 MG PO CAPS: 500.0000 mg | ORAL_CAPSULE | Freq: Every day | ORAL | 11 refills | Status: DC

## 2015-10-06 NOTE — Progress Notes (Signed)
Subjective:   Chief complaint: follow-up for graft infection and MSSA bacteremia   Patient ID: Kyle [name], male    DOB: [birth year], 60 y.o.   MRN: [id number]  HPI  60 year old man with ESRD on HD who had graft infection with MSSA and consqeuent MSSA bacteremia. He underwent resection of the graft on 08/31/15 with placement of PTFE graft and removal of infected stent. My initial plan was for 4 weeks of IV  Ancef along with rifampin 600mg  daily orally to be followed by chronic suppressive abx. His HD catheter had been removed during that  Admission. He was brought back for HD catheter placment on 09/08/15 when his graft was not functioning.   He finished IV abx before he could be seen by me and never took rifampin. He had surveillance blood cultures done which were negative.   The patient does not feel palpable pulse in his old graft and is skeptical there is much blood flow here.   He is otherwise doing quite well.  Past Medical History:  Diagnosis Date  . CAD (coronary artery disease)   . Cardiac arrest ([place])    07/2014-[facility] initiated  . Diabetes mellitus without complication ([place])   . ESRD (end stage renal disease) ([place])    [facility] M/W/F  . Hypertension   . Staphylococcus aureus bacteremia 08/29/2015    Past Surgical History:  Procedure Laterality Date  . AV FISTULA PLACEMENT Left 09/11/2014   Procedure: INSERTION OF LEFT ARM 6MM ARTERIOVENOUS GORTEX GRAFT ;  Surgeon: Elam Dutch, MD;  Location: [facility];  Service: Vascular;  Laterality: Left;  . CARDIAC CATHETERIZATION N/A 09/04/2014   Procedure: Right/Left Heart Cath and Coronary Angiography;  Surgeon: Peter M Martinique, MD;  Location: [facility];  Service: Cardiovascular;  Laterality: N/A;  . EXTERNAL EAR SURGERY    . INSERTION OF DIALYSIS CATHETER N/A 08/21/2014   Procedure: INSERTION OF DIALYSIS CATHETER RIGHT INTERNAL JUGULAR VEIN;  Surgeon: Conrad Creedmoor, MD;  Location: [facility];  Service: Vascular;   Laterality: N/A;  MAC to General  . INSERTION OF DIALYSIS CATHETER N/A 09/08/2015   Procedure: INSERTION OF DIALYSIS CATHETER RIGHT INTERNAL JUGULAR;  Surgeon: Rosetta Posner, MD;  Location: [facility];  Service: Vascular;  Laterality: N/A;  . REVISION OF ARTERIOVENOUS GORETEX GRAFT Left 08/31/2015   Procedure: REVISION OF LEFT ARTERIOVENOUS GORETEX GRAFT USING  AN INTERPOSITION  4-7MM GORTEX GRAFT WITH REMOVAL OF INFECTED STENT;  INTRAOPERATIVE ARTERIOGRAM TIMES ONE.;  Surgeon: Angelia Mould, MD;  Location: [facility];  Service: Vascular;  Laterality: Left;  . TONSILLECTOMY      Family History  Problem Relation Age of Onset  . Heart failure Mother   . Diabetes Mother   . Hypertension Mother   . Diabetes Father       Social History   Social History  . Marital status: Married    Spouse name: N/A  . Number of children: N/A  . Years of education: N/A   Social History Main Topics  . Smoking status: Former Smoker    Types: Cigarettes  . Smokeless tobacco: Never Used  . Alcohol use No  . Drug use: No  . Sexual activity: Not Asked   Other Topics Concern  . None   Social History Narrative  . None    No Known Allergies   Current Outpatient Prescriptions:  .  aspirin 81 MG tablet, Take 81 mg by mouth daily., Disp: , Rfl:  .  atorvastatin (LIPITOR) 40 MG tablet, Take 1 tablet (40 mg total) by mouth daily., Disp: 30 tablet, Rfl: 11 .  B Complex-C-Folic Acid (RENA-VITE RX) 1 MG TABS, Take 1 tablet by mouth daily., Disp: , Rfl: 6 .  carvedilol (COREG) 12.5 MG tablet, Take 1 tablet (12.5 mg total) by mouth 2 (two) times daily with a meal., Disp: 180 tablet, Rfl: 3 .  doxercalciferol (HECTOROL) 4 MCG/2ML injection, Inject 2.5 mLs (5 mcg total) into the vein Every Tuesday,Thursday,and Saturday with dialysis., Disp: 2 mL, Rfl: 12 .  glimepiride (AMARYL) 2 MG tablet, Take 1 tablet (2 mg total) by mouth daily with breakfast., Disp: 30 tablet, Rfl: 1 .  lisinopril (PRINIVIL,ZESTRIL) 20 MG  tablet, Take 20 mg by mouth daily. , Disp: , Rfl: 6 .  cephALEXin (KEFLEX) 500 MG capsule, Take 1 capsule (500 mg total) by mouth daily., Disp: 30 capsule, Rfl: 11   Review of Systems  Constitutional: Negative for chills and fever.  HENT: Negative for congestion and sore throat.   Eyes: Negative for photophobia.  Respiratory: Negative for cough, shortness of breath and wheezing.   Cardiovascular: Negative for chest pain, palpitations and leg swelling.  Gastrointestinal: Negative for abdominal pain, blood in stool, constipation, diarrhea, nausea and vomiting.  Genitourinary: Negative for dysuria, flank pain and hematuria.  Musculoskeletal: Negative for back pain and myalgias.  Skin: Negative for rash.  Neurological: Negative for dizziness, weakness and headaches.  Hematological: Does not bruise/bleed easily.  Psychiatric/Behavioral: Negative for suicidal ideas.       Objective:   Physical Exam  Constitutional: He is oriented to person, place, and time. He appears well-developed and well-nourished. No distress.  HENT:  Head: Normocephalic and atraumatic.  Mouth/Throat: No oropharyngeal exudate.  Eyes: Conjunctivae and EOM are normal. No scleral icterus.  Neck: Normal range of motion. Neck supple.  Cardiovascular: Normal rate and regular rhythm.   Pulmonary/Chest: Effort normal. No respiratory distress. He has no wheezes.  Abdominal: He exhibits no distension.  Musculoskeletal: He exhibits no edema or tenderness.  Neurological: He is alert and oriented to person, place, and time. He exhibits normal muscle tone. Coordination normal.  Skin: Skin is warm and dry. No rash noted. He is not diaphoretic. No erythema. No pallor.  Psychiatric: He has a normal mood and affect. His behavior is normal. Judgment and thought content normal.    Left graft is without audible bruit and could not palpate pulse well in it--which he states has been the condiitoin of the graft for several weeks.  HD  catheter is clean      Assessment & Plan:   60 year old with ESRD and graft infection with MSSA and bacteremia sp resection of stent and portion of infected graft, placement of PTFE but with failure of this new site to function with HD, then subsequent HD catheter placment while being treated with ancef with HD and after clearance of bacteremia  For now I will change to oral keflex 500mg  daily and post HD on HD days. I will not add rifampin in at this point.   He is to visit with [facility]  IF his "old graft site is truly without blood flow or communication with his blood stream we may not have to continue protracted suppressive antibiotics. For now I am going to err on the side of caution and continue keflex as above  I spent greater than 25 minutes with the patient including greater than 50% of time in face to face counsel of  the patient re his graft infection, MSSA bacteremia and ESRD on HD and in coordination of his care.

## 2015-10-07 ENCOUNTER — Encounter (HOSPITAL_COMMUNITY): Payer: BLUE CROSS/BLUE SHIELD

## 2015-10-07 ENCOUNTER — Ambulatory Visit (INDEPENDENT_AMBULATORY_CARE_PROVIDER_SITE_OTHER): Payer: BLUE CROSS/BLUE SHIELD | Admitting: Vascular Surgery

## 2015-10-07 ENCOUNTER — Encounter: Payer: Self-pay | Admitting: Vascular Surgery

## 2015-10-07 ENCOUNTER — Other Ambulatory Visit (HOSPITAL_COMMUNITY): Payer: BLUE CROSS/BLUE SHIELD

## 2015-10-07 ENCOUNTER — Other Ambulatory Visit: Payer: Self-pay

## 2015-10-07 ENCOUNTER — Encounter: Payer: BLUE CROSS/BLUE SHIELD | Admitting: Vascular Surgery

## 2015-10-07 VITALS — BP 125/77 | HR 88 | Temp 98.0°F | Resp 16 | Ht 72.0 in | Wt 173.6 lb

## 2015-10-07 DIAGNOSIS — Z992 Dependence on renal dialysis: Secondary | ICD-10-CM

## 2015-10-07 DIAGNOSIS — N186 End stage renal disease: Secondary | ICD-10-CM

## 2015-10-07 NOTE — Progress Notes (Signed)
   Patient name: Kyle [name] MRN: [id number] DOB: 09/29/1955 Sex: male  REASON FOR VISIT: Follow-up access for hemodialysis  HPI: Kyle [name] is a 60 y.o. male long history of left forearm loop AV graft. He had removal of a infected exposed covered stent by Dr. Scot Dock on 08/31/2015. Had rerouting of his graft around this. Had failure of the attempt to save his access and I put a tunneled dialysis catheter in several days later. He is having successful dialysis via the tunneled catheter with no problems so far. These here today for discussion of further access  Current Outpatient Prescriptions  Medication Sig Dispense Refill  . aspirin 81 MG tablet Take 81 mg by mouth daily.    Marland Kitchen atorvastatin (LIPITOR) 40 MG tablet Take 1 tablet (40 mg total) by mouth daily. 30 tablet 11  . B Complex-C-Folic Acid (RENA-VITE RX) 1 MG TABS Take 1 tablet by mouth daily.  6  . carvedilol (COREG) 12.5 MG tablet Take 1 tablet (12.5 mg total) by mouth 2 (two) times daily with a meal. 180 tablet 3  . cephALEXin (KEFLEX) 500 MG capsule Take 1 capsule (500 mg total) by mouth daily. 30 capsule 11  . doxercalciferol (HECTOROL) 4 MCG/2ML injection Inject 2.5 mLs (5 mcg total) into the vein Every Tuesday,Thursday,and Saturday with dialysis. 2 mL 12  . glimepiride (AMARYL) 2 MG tablet Take 1 tablet (2 mg total) by mouth daily with breakfast. 30 tablet 1  . lisinopril (PRINIVIL,ZESTRIL) 20 MG tablet Take 20 mg by mouth daily.   6   No current facility-administered medications for this visit.      PHYSICAL EXAM: Vitals:   10/07/15 0944  BP: 125/77  Pulse: 88  Resp: 16  Temp: 98 F (36.7 C)  SpO2: 100%  Weight: 173 lb 9.6 oz (78.7 kg)  Height: 6' (1.829 m)    GENERAL: The patient is a well-nourished male, in no acute distress. The vital signs are documented above. Left arm graft is occluded. Has a complete healing at the antecubital incision where he had the exposed covered  stent. The remainder of his access shows no fluctuance no erythema and no tenderness at all.  He does have 2+ radial pulses bilaterally. He is thin and has easily visible forearm cephalic vein with some potential thickness. His upper arm cephalic vein looks quite good with normal caliber and is superficial.  MEDICAL ISSUES: Present had complete healing of the area of the graft removal. Would not recommend removal of the remaining portion of his graft since he is having no evidence of infection in this. Recommend right arm AV fistula. Would image the vein at the time of surgery and potentially proceed with radiocephalic if the forearm vein is not some sclerotic. If the vein is sclerotic would have an upper arm AV fistula. We'll schedule this at his earliest convenience. He has hemodialysis on Monday Wednesday and Friday so we'll schedule him for a Tuesday or Thursday. He understands that I'm in the office on Tuesday and Thursday so one of my partners will be doing the procedure   Rosetta Posner, MD FACS [facility] Tel [phone] Pager [phone]

## 2015-10-07 NOTE — Progress Notes (Signed)
Kyle [name] is the new graft material in contact with circulating blood or has it shut down as well. I just want to understand if there risk for recurrence of his bacteremia from his endovascular space if the new graft could have been seeded thanks Jac Canavan

## 2015-10-13 ENCOUNTER — Encounter (HOSPITAL_COMMUNITY): Payer: Self-pay | Admitting: *Deleted

## 2015-10-13 NOTE — Progress Notes (Signed)
Pt has hx of cardiac arrest during surgery in July, 2016. Has not had any issues since. Pt denies any recent chest pain or sob. Pt is diabetic, states last A1C in August, 2017 was 7.5. States fasting blood sugar is usually around 120, pt on oral diabetic meds only. Instructed to check his blood sugar in the AM when he gets up. If blood sugar is 70 or below, treat with 1/2 cup of clear juice (apple or cranberry) and recheck blood sugar 15 minutes after drinking juice. If blood sugar continues to be 70 or below, call the Short Stay department and ask to speak to a nurse. Pt voiced understanding.

## 2015-10-13 NOTE — Progress Notes (Addendum)
Anesthesia Chart Review: SAME DAY WORK-UP.   Patient is a 60 year old male scheduled for creation RUE AVG on 10/14/15 by Dr. Scot Dock. Anesthesia is posted for MAC.   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  - On 08/20/14, he was admitted with acute on chronic combined systolic and diastolic CHF (EF 52%) in the setting of acute on chronic CKD stage V and failed cardiology and nephrology follow-up since acute CHF admission 12/2013. Hemodialysis catheter insertion with initiation of hemodialysis followed by cardiac cath planned. On 08/21/14 he underwent right IJ HD catheter insertion. Procedure was started as MAC but he then developed significant bradycardia (59 to 29 bpm), hypotension and given atropine and epinephrine, CPR for PEA arrest initiated. Patient was intubated, femoral line inserted. CPR was administered for approximately 12 minutes with ROSC. He was transferred to ICU for PCCM and cardiology evaluations. [employer] protocol initiated. 08/22/14 echo showed EF down to 10% with question of cardiac amyloidosis. On 09/02/14, EP cardiologist Dr. Caryl Comes was consulted. Patient not felt to be a candidate for CRT due to narrow QRS complex. Would possibly consider ICD in the future but would have to exclude ischemic heart disease and amyloid first. 09/04/14 cardiac cath showed 1V CAD in a non-dominant RCA and medical therapy was recommended. 09/06/15 abdominal fat pad biopsy was negative for amyloid. Out-patient follow-up echo 03/2015 showed normalization of his EF.  PCP is listed as Dr. Hayden Rasmussen with [facility]. ID is Dr. Tommy Medal.  Cardiologist is Dr.  Kirk Ruths, last visit 04/09/15. Endocrinologist is Dr. Amalia Greenhouse with [facility].   Meds include lisinopril, ASA 81 mg, Lipitor, Coreg, Keflex, Hectorol, Amaryl.  Echo 08/30/15: Study Conclusions - Left ventricle: The cavity size was normal. Wall thickness was   normal. Systolic function was normal. The estimated ejection   fraction was in the range of 55% to 60%. Wall motion was normal;   there were no regional wall motion abnormalities. Doppler   parameters are consistent with abnormal left ventricular   relaxation (grade 1 diastolic dysfunction). - Left atrium: The atrium was mildly dilated. Impressions: - Normal LV systolic function; grade 1 diastolic dysfunction; trace   MR; mild LAE.  EKG 11/18/14 ([facility]): NSR, LAE, non-specific ST/T wave abnormality, prolonged QT.  LHC 09/04/14 (Dr. Peter Martinique) LM: Normal LAD: Ostial 25% LCx: Proximal 20%, OM1 20% RCA: Proximal 80% Moderate pulmonary hypertension, normal LVEDP 1. Single vessel obstructive CAD involving a nondominant RCA 2. Moderate pulmonary HTN with normal LV filling pressures.  Recommendations: medical management.   09/08/15 1V CXR: IMPRESSION: New right jugular central venous catheter tip overlies the proximal right atrium. No evidence of pneumothorax. No active lung disease.  04/24/15 Thyroid U/S (Care Everywhere): IMPRESSION: 1. Thyromegaly with bilateral nodules. The dominant right mid lobe nodule meets consensus criteria for biopsy. Ultrasound-guided fine needle aspiration should be considered. S/P FNA 06/16/15: Rare groups of folliculr cells in the background of abundant colloid. The findings are that of a benign colloid nodule however due to a low cellularity a thorough evaluation of the nodule cannot be performed. Clinical follow-up is recommended.   He is for labs on arrival.   Further evaluation on the day of surgery,  but if labs acceptable and no acute CV/CHF issues then I would anticipate that he  could proceed as planned.  George Hugh [facility] Short Stay Center/Anesthesiology Phone [phone] 10/13/2015 12:18 PM

## 2015-10-14 ENCOUNTER — Encounter (HOSPITAL_COMMUNITY)
Admission: RE | Disposition: A | Discharge status: Home / Self Care | Payer: Self-pay | Source: Ambulatory Visit | Attending: Vascular Surgery

## 2015-10-14 ENCOUNTER — Ambulatory Visit (HOSPITAL_COMMUNITY)
Admission: RE | Admit: 2015-10-14 | Discharge: 2015-10-14 | Disposition: A | Discharge status: Home / Self Care | Payer: BLUE CROSS/BLUE SHIELD | Source: Ambulatory Visit | Attending: Vascular Surgery | Admitting: Vascular Surgery

## 2015-10-14 ENCOUNTER — Ambulatory Visit (HOSPITAL_COMMUNITY): Payer: BLUE CROSS/BLUE SHIELD | Admitting: Vascular Surgery

## 2015-10-14 ENCOUNTER — Encounter (HOSPITAL_COMMUNITY): Payer: Self-pay | Admitting: Urology

## 2015-10-14 DIAGNOSIS — D631 Anemia in chronic kidney disease: Secondary | ICD-10-CM | POA: Diagnosis not present

## 2015-10-14 DIAGNOSIS — N185 Chronic kidney disease, stage 5: Secondary | ICD-10-CM | POA: Insufficient documentation

## 2015-10-14 DIAGNOSIS — I132 Hypertensive heart and chronic kidney disease with heart failure and with stage 5 chronic kidney disease, or end stage renal disease: Secondary | ICD-10-CM | POA: Insufficient documentation

## 2015-10-14 DIAGNOSIS — Z7982 Long term (current) use of aspirin: Secondary | ICD-10-CM | POA: Insufficient documentation

## 2015-10-14 DIAGNOSIS — Z87891 Personal history of nicotine dependence: Secondary | ICD-10-CM | POA: Insufficient documentation

## 2015-10-14 DIAGNOSIS — I509 Heart failure, unspecified: Secondary | ICD-10-CM | POA: Diagnosis not present

## 2015-10-14 DIAGNOSIS — Z79899 Other long term (current) drug therapy: Secondary | ICD-10-CM | POA: Insufficient documentation

## 2015-10-14 DIAGNOSIS — Z992 Dependence on renal dialysis: Secondary | ICD-10-CM | POA: Diagnosis present

## 2015-10-14 DIAGNOSIS — E1122 Type 2 diabetes mellitus with diabetic chronic kidney disease: Secondary | ICD-10-CM | POA: Diagnosis not present

## 2015-10-14 HISTORY — DX: Cardiac murmur, unspecified: R01.1

## 2015-10-14 HISTORY — PX: AV FISTULA PLACEMENT: SHX1204

## 2015-10-14 HISTORY — DX: Heart failure, unspecified: I50.9

## 2015-10-14 LAB — GLUCOSE, CAPILLARY
GLUCOSE-CAPILLARY: 99 mg/dL (ref 65–99)
GLUCOSE-CAPILLARY: 85 mg/dL (ref 65–99)

## 2015-10-14 LAB — POCT I-STAT 4, (NA,K, GLUC, HGB,HCT)
Glucose, Bld: 95 mg/dL (ref 65–99)
HCT: 37 % — ABNORMAL LOW (ref 39.0–52.0)
HEMOGLOBIN: 12.6 g/dL — AB (ref 13.0–17.0)
Potassium: 4.6 mmol/L (ref 3.5–5.1)
SODIUM: 139 mmol/L (ref 135–145)

## 2015-10-14 SURGERY — ARTERIOVENOUS (AV) FISTULA CREATION
Anesthesia: General | Site: Arm Lower | Laterality: Right

## 2015-10-14 MED ORDER — LIDOCAINE HCL (CARDIAC) 20 MG/ML IV SOLN
INTRAVENOUS | Status: DC | PRN
  Administered 2015-10-14: 60 mg via INTRATRACHEAL

## 2015-10-14 MED ORDER — PAPAVERINE HCL 30 MG/ML IJ SOLN
INTRAMUSCULAR | Status: AC
  Filled 2015-10-14: qty 2

## 2015-10-14 MED ORDER — PROPOFOL 10 MG/ML IV BOLUS
INTRAVENOUS | Status: AC
  Filled 2015-10-14: qty 20

## 2015-10-14 MED ORDER — FENTANYL CITRATE (PF) 100 MCG/2ML IJ SOLN: 25.0000 ug | INTRAMUSCULAR | Status: DC | PRN

## 2015-10-14 MED ORDER — CHLORHEXIDINE GLUCONATE 4 % EX LIQD: 1.0000 "application " | Freq: Once | CUTANEOUS | Status: DC

## 2015-10-14 MED ORDER — ONDANSETRON HCL 4 MG/2ML IJ SOLN: 4.0000 mg | Freq: Once | INTRAMUSCULAR | Status: DC | PRN

## 2015-10-14 MED ORDER — MIDAZOLAM HCL 5 MG/5ML IJ SOLN
INTRAMUSCULAR | Status: DC | PRN
  Administered 2015-10-14: 2 mg via INTRAVENOUS

## 2015-10-14 MED ORDER — HEPARIN SODIUM (PORCINE) 1000 UNIT/ML IJ SOLN
INTRAMUSCULAR | Status: DC | PRN
  Administered 2015-10-14: 7000 [IU] via INTRAVENOUS

## 2015-10-14 MED ORDER — SODIUM CHLORIDE 0.9 % IV SOLN
INTRAVENOUS | Status: DC | PRN
  Administered 2015-10-14: 500 mL

## 2015-10-14 MED ORDER — PHENYLEPHRINE HCL 10 MG/ML IJ SOLN
INTRAMUSCULAR | Status: DC | PRN
  Administered 2015-10-14: 40 ug via INTRAVENOUS
  Administered 2015-10-14 (×2): 80 ug via INTRAVENOUS
  Administered 2015-10-14: 40 ug via INTRAVENOUS
  Administered 2015-10-14: 80 ug via INTRAVENOUS

## 2015-10-14 MED ORDER — OXYCODONE HCL 5 MG PO TABS
5.0000 mg | ORAL_TABLET | Freq: Once | ORAL | Status: DC | PRN
Start: 2015-10-14 — End: 2015-10-14

## 2015-10-14 MED ORDER — EPHEDRINE SULFATE 50 MG/ML IJ SOLN
INTRAMUSCULAR | Status: DC | PRN
  Administered 2015-10-14 (×2): 10 mg via INTRAVENOUS
  Administered 2015-10-14: 15 mg via INTRAVENOUS
  Administered 2015-10-14: 10 mg via INTRAVENOUS
  Administered 2015-10-14: 5 mg via INTRAVENOUS

## 2015-10-14 MED ORDER — FENTANYL CITRATE (PF) 100 MCG/2ML IJ SOLN
INTRAMUSCULAR | Status: AC
  Filled 2015-10-14: qty 2

## 2015-10-14 MED ORDER — THROMBIN 20000 UNITS EX SOLR
CUTANEOUS | Status: AC
  Filled 2015-10-14: qty 20000

## 2015-10-14 MED ORDER — DEXTROSE 5 % IV SOLN
1.5000 g | INTRAVENOUS | Status: AC
  Administered 2015-10-14: 1.5 g via INTRAVENOUS
  Filled 2015-10-14: qty 1.5

## 2015-10-14 MED ORDER — SODIUM CHLORIDE 0.9 % IV SOLN: INTRAVENOUS | Status: DC

## 2015-10-14 MED ORDER — OXYCODONE HCL 5 MG/5ML PO SOLN: 5.0000 mg | Freq: Once | ORAL | Status: DC | PRN

## 2015-10-14 MED ORDER — SODIUM CHLORIDE 0.9 % IV SOLN
INTRAVENOUS | Status: DC | PRN
  Administered 2015-10-14 (×2): via INTRAVENOUS

## 2015-10-14 MED ORDER — OXYCODONE-ACETAMINOPHEN 5-325 MG PO TABS: 1.0000 | ORAL_TABLET | Freq: Four times a day (QID) | ORAL | 0 refills | Status: DC | PRN

## 2015-10-14 MED ORDER — LIDOCAINE-EPINEPHRINE (PF) 1 %-1:200000 IJ SOLN
INTRAMUSCULAR | Status: AC
  Filled 2015-10-14: qty 30

## 2015-10-14 MED ORDER — MIDAZOLAM HCL 2 MG/2ML IJ SOLN
INTRAMUSCULAR | Status: AC
  Filled 2015-10-14: qty 2

## 2015-10-14 MED ORDER — PROTAMINE SULFATE 10 MG/ML IV SOLN
INTRAVENOUS | Status: DC | PRN
  Administered 2015-10-14 (×3): 10 mg via INTRAVENOUS

## 2015-10-14 MED ORDER — SODIUM CHLORIDE 0.9 % IV SOLN
INTRAVENOUS | Status: DC
  Administered 2015-10-14: 08:00:00 via INTRAVENOUS

## 2015-10-14 MED ORDER — ONDANSETRON HCL 4 MG/2ML IJ SOLN
INTRAMUSCULAR | Status: DC | PRN
  Administered 2015-10-14: 4 mg via INTRAVENOUS

## 2015-10-14 MED ORDER — FENTANYL CITRATE (PF) 100 MCG/2ML IJ SOLN
INTRAMUSCULAR | Status: DC | PRN
  Administered 2015-10-14: 75 ug via INTRAVENOUS
  Administered 2015-10-14: 25 ug via INTRAVENOUS

## 2015-10-14 MED ORDER — PROPOFOL 10 MG/ML IV BOLUS
INTRAVENOUS | Status: DC | PRN
  Administered 2015-10-14: 190 mg via INTRAVENOUS

## 2015-10-14 MED ORDER — 0.9 % SODIUM CHLORIDE (POUR BTL) OPTIME
TOPICAL | Status: DC | PRN
  Administered 2015-10-14: 1000 mL

## 2015-10-14 SURGICAL SUPPLY — 31 items
ARMBAND PINK RESTRICT EXTREMIT (MISCELLANEOUS) ×4 IMPLANT
CANISTER SUCTION 2500CC (MISCELLANEOUS) ×3 IMPLANT
CANNULA VESSEL 3MM 2 BLNT TIP (CANNULA) ×3 IMPLANT
CLIP TI MEDIUM 6 (CLIP) ×3 IMPLANT
CLIP TI WIDE RED SMALL 6 (CLIP) ×5 IMPLANT
COVER PROBE W GEL 5X96 (DRAPES) IMPLANT
DECANTER SPIKE VIAL GLASS SM (MISCELLANEOUS) ×1 IMPLANT
ELECT REM PT RETURN 9FT ADLT (ELECTROSURGICAL) ×3
ELECTRODE REM PT RTRN 9FT ADLT (ELECTROSURGICAL) ×1 IMPLANT
GLOVE BIO SURGEON STRL SZ7.5 (GLOVE) ×3 IMPLANT
GLOVE BIOGEL PI IND STRL 6.5 (GLOVE) IMPLANT
GLOVE BIOGEL PI IND STRL 8 (GLOVE) ×1 IMPLANT
GLOVE BIOGEL PI INDICATOR 6.5 (GLOVE) ×2
GLOVE BIOGEL PI INDICATOR 8 (GLOVE) ×2
GLOVE SURG SS PI 7.0 STRL IVOR (GLOVE) ×2 IMPLANT
GOWN STRL REUS W/ TWL LRG LVL3 (GOWN DISPOSABLE) ×3 IMPLANT
GOWN STRL REUS W/TWL LRG LVL3 (GOWN DISPOSABLE) ×9
KIT BASIN OR (CUSTOM PROCEDURE TRAY) ×3 IMPLANT
KIT ROOM TURNOVER OR (KITS) ×3 IMPLANT
LIQUID BAND (GAUZE/BANDAGES/DRESSINGS) ×3 IMPLANT
NS IRRIG 1000ML POUR BTL (IV SOLUTION) ×3 IMPLANT
PACK CV ACCESS (CUSTOM PROCEDURE TRAY) ×3 IMPLANT
PAD ARMBOARD 7.5X6 YLW CONV (MISCELLANEOUS) ×6 IMPLANT
SPONGE SURGIFOAM ABS GEL 100 (HEMOSTASIS) IMPLANT
SUT PROLENE 6 0 BV (SUTURE) ×3 IMPLANT
SUT VIC AB 3-0 SH 27 (SUTURE) ×6
SUT VIC AB 3-0 SH 27X BRD (SUTURE) ×1 IMPLANT
SUT VIC AB 4-0 PS2 27 (SUTURE) ×2 IMPLANT
SUT VICRYL 4-0 PS2 18IN ABS (SUTURE) ×3 IMPLANT
UNDERPAD 30X30 (UNDERPADS AND DIAPERS) ×3 IMPLANT
WATER STERILE IRR 1000ML POUR (IV SOLUTION) ×1 IMPLANT

## 2015-10-14 NOTE — Anesthesia Procedure Notes (Signed)
Procedure Name: LMA Insertion Date/Time: 10/14/2015 8:54 AM Performed by: Neldon Newport Pre-anesthesia Checklist: Timeout performed, Patient being monitored, Suction available, Emergency Drugs available and Patient identified Patient Re-evaluated:Patient Re-evaluated prior to inductionOxygen Delivery Method: Circle system utilized Preoxygenation: Pre-oxygenation with 100% oxygen Intubation Type: IV induction Ventilation: Mask ventilation without difficulty LMA: LMA inserted LMA Size: 4.0 Tube type: Oral Tube size: 4.0 mm Number of attempts: 1 Placement Confirmation: breath sounds checked- equal and bilateral,  positive ETCO2 and ETT inserted through vocal cords under direct vision Tube secured with: Tape Dental Injury: Teeth and Oropharynx as per pre-operative assessment

## 2015-10-14 NOTE — Op Note (Signed)
    NAME: ZANNIE RUNKLE   MRN: [id number] DOB: [birth year]    DATE OF OPERATION: 10/14/2015  PREOP DIAGNOSIS: Stage V chronic kidney disease  POSTOP DIAGNOSIS: Same  PROCEDURE: Right brachiocephalic AV fistula  SURGEON: Judeth Cornfield. Scot Dock, MD, FACS  ASSIST: Gerri Lins PA  ANESTHESIA: Gen.   EBL: Minimal  INDICATIONS: JAIS DEMIR is a 60 y.o. male who presents for new access.  FINDINGS: 4 mm upper arm right cephalic vein  TECHNIQUE: The patient was taken to the operative room and received a general anesthetic. The right arm was prepped and draped in the usual sterile fashion. The vein was fairly far lateral and therefore elected to make a separate incision over the artery and vein. A longitudinal incision was made over the artery. The brachial artery was dissected free. A separate longitudinal was made over the vein. The vein was dissected free with branches divided between clips and 3-0 silk ties. The vein was ligated distally and irrigated out nicely with heparin saline. The patient was heparinized. The tunnel was crated between the 2 incisions. The brachial artery was clamped proximally and distally and a longitudinal arteriotomy was made. The vein was sewn end-to-side to the artery using continuous 6-0 Prolene suture. At completion was an excellent thrill in the fistula. There was a palpable radial pulse. The heparin was partially reversed with protamine. Each of the wounds was closed with deep 3-0 Vicryl and the skin closed with 4-0 Vicryl. Dermabond was applied. The patient tolerated the procedure well and was transferred to the recovery room in stable condition. All needle and sponge counts were correct.  [name], MD, FACS [facility]  DATE OF DICTATION:   10/14/2015

## 2015-10-14 NOTE — H&P (View-Only) (Signed)
   Patient name: Kyle [name] MRN: [id number] DOB: 02-18-1955 Sex: male  REASON FOR VISIT: Follow-up access for hemodialysis  HPI: Kyle [name] is a 60 y.o. male long history of left forearm loop AV graft. He had removal of a infected exposed covered stent by Dr. Scot Dock on 08/31/2015. Had rerouting of his graft around this. Had failure of the attempt to save his access and I put a tunneled dialysis catheter in several days later. He is having successful dialysis via the tunneled catheter with no problems so far. These here today for discussion of further access  Current Outpatient Prescriptions  Medication Sig Dispense Refill  . aspirin 81 MG tablet Take 81 mg by mouth daily.    Marland Kitchen atorvastatin (LIPITOR) 40 MG tablet Take 1 tablet (40 mg total) by mouth daily. 30 tablet 11  . B Complex-C-Folic Acid (RENA-VITE RX) 1 MG TABS Take 1 tablet by mouth daily.  6  . carvedilol (COREG) 12.5 MG tablet Take 1 tablet (12.5 mg total) by mouth 2 (two) times daily with a meal. 180 tablet 3  . cephALEXin (KEFLEX) 500 MG capsule Take 1 capsule (500 mg total) by mouth daily. 30 capsule 11  . doxercalciferol (HECTOROL) 4 MCG/2ML injection Inject 2.5 mLs (5 mcg total) into the vein Every Tuesday,Thursday,and Saturday with dialysis. 2 mL 12  . glimepiride (AMARYL) 2 MG tablet Take 1 tablet (2 mg total) by mouth daily with breakfast. 30 tablet 1  . lisinopril (PRINIVIL,ZESTRIL) 20 MG tablet Take 20 mg by mouth daily.   6   No current facility-administered medications for this visit.      PHYSICAL EXAM: Vitals:   10/07/15 0944  BP: 125/77  Pulse: 88  Resp: 16  Temp: 98 F (36.7 C)  SpO2: 100%  Weight: 173 lb 9.6 oz (78.7 kg)  Height: 6' (1.829 m)    GENERAL: The patient is a well-nourished male, in no acute distress. The vital signs are documented above. Left arm graft is occluded. Has a complete healing at the antecubital incision where he had the exposed covered  stent. The remainder of his access shows no fluctuance no erythema and no tenderness at all.  He does have 2+ radial pulses bilaterally. He is thin and has easily visible forearm cephalic vein with some potential thickness. His upper arm cephalic vein looks quite good with normal caliber and is superficial.  MEDICAL ISSUES: Present had complete healing of the area of the graft removal. Would not recommend removal of the remaining portion of his graft since he is having no evidence of infection in this. Recommend right arm AV fistula. Would image the vein at the time of surgery and potentially proceed with radiocephalic if the forearm vein is not some sclerotic. If the vein is sclerotic would have an upper arm AV fistula. We'll schedule this at his earliest convenience. He has hemodialysis on Monday Wednesday and Friday so we'll schedule him for a Tuesday or Thursday. He understands that I'm in the office on Tuesday and Thursday so one of my partners will be doing the procedure   Rosetta Posner, MD FACS [facility] Tel [phone] Pager [phone]

## 2015-10-14 NOTE — Anesthesia Postprocedure Evaluation (Signed)
Anesthesia Post Note  Patient: Kyle [name]  Procedure(s) Performed: Procedure(s) (LRB): ARTERIOVENOUS (AV) FISTULA CREATION (Right)  Patient location during evaluation: PACU Anesthesia Type: General Level of consciousness: awake, awake and alert and oriented Pain management: pain level controlled Vital Signs Assessment: post-procedure vital signs reviewed and stable Respiratory status: spontaneous breathing, nonlabored ventilation and respiratory function stable Cardiovascular status: blood pressure returned to baseline Postop Assessment: no headache Anesthetic complications: no    Last Vitals:  Vitals:   10/14/15 1029 10/14/15 1045  BP: (!) 119/55 109/70  Pulse: 73 68  Resp: 16 16  Temp:      Last Pain:  Vitals:   10/14/15 1045  TempSrc:   PainSc: 0-No pain                 Iasiah Ozment COKER

## 2015-10-14 NOTE — Anesthesia Preprocedure Evaluation (Addendum)
Anesthesia Evaluation  Patient identified by MRN, date of birth, ID band Patient awake    Reviewed: Allergy & Precautions, H&P , NPO status   Airway Mallampati: II  TM Distance: >3 FB     Dental  (+) Teeth Intact, Dental Advidsory Given   Pulmonary former smoker,    breath sounds clear to auscultation       Cardiovascular hypertension, +CHF   Rhythm:Regular Rate:Normal     Neuro/Psych    GI/Hepatic   Endo/Other  diabetes  Renal/GU ESRFRenal disease     Musculoskeletal   Abdominal   Peds  Hematology  (+) anemia ,   Anesthesia Other Findings Study Conclusions  - Left ventricle: The cavity size was normal. Wall thickness was   normal. Systolic function was normal. The estimated ejection   fraction was in the range of 55% to 60%. Wall motion was normal;   there were no regional wall motion abnormalities. Doppler   parameters are consistent with abnormal left ventricular   relaxation (grade 1 diastolic dysfunction). - Left atrium: The atrium was mildly dilated.  Impressions:  - Normal LV systolic function; grade 1 diastolic dysfunction; trace   MR; mild LAE.  Reproductive/Obstetrics                           Anesthesia Physical Anesthesia Plan  ASA: III  Anesthesia Plan: General   Post-op Pain Management:    Induction: Intravenous  Airway Management Planned: LMA  Additional Equipment:   Intra-op Plan:   Post-operative Plan:   Informed Consent: I have reviewed the patients History and Physical, chart, labs and discussed the procedure including the risks, benefits and alternatives for the proposed anesthesia with the patient or authorized representative who has indicated his/her understanding and acceptance.   Dental Advisory Given and Dental advisory given  Plan Discussed with: Anesthesiologist, CRNA and Surgeon  Anesthesia Plan Comments:        Anesthesia Quick  Evaluation

## 2015-10-14 NOTE — Transfer of Care (Signed)
Immediate Anesthesia Transfer of Care Note  Patient: Kyle [name]  Procedure(s) Performed: Procedure(s): ARTERIOVENOUS (AV) FISTULA CREATION (Right)  Patient Location: PACU  Anesthesia Type:General  Level of Consciousness: awake, alert  and oriented  Airway & Oxygen Therapy: Patient Spontanous Breathing and Patient connected to nasal cannula oxygen  Post-op Assessment: Report given to RN, Post -op Vital signs reviewed and stable and Patient moving all extremities X 4  Post vital signs: Reviewed and stable  Last Vitals:  Vitals:   10/14/15 0720 10/14/15 1014  BP: 118/74   Pulse: 72   Resp: 18   Temp: 36.8 C 36.8 C    Last Pain:  Vitals:   10/14/15 0720  TempSrc: Oral         Complications: No apparent anesthesia complications

## 2015-10-14 NOTE — Interval H&P Note (Signed)
History and Physical Interval Note:  10/14/2015 8:26 AM  Kyle [name]  has presented today for surgery, with the diagnosis of End Stage Renal Disease N18.6  The various methods of treatment have been discussed with the patient and family. After consideration of risks, benefits and other options for treatment, the patient has consented to  Procedure(s): ARTERIOVENOUS (AV) FISTULA CREATION (Right) as a surgical intervention .  [name] history has been reviewed, patient examined, no change in status, stable for surgery.  I have reviewed the patient's chart and labs.  Questions were answered to the patient's satisfaction.     [name]

## 2015-10-15 ENCOUNTER — Encounter (HOSPITAL_COMMUNITY): Payer: Self-pay | Admitting: Vascular Surgery

## 2015-10-15 ENCOUNTER — Telehealth: Payer: Self-pay | Admitting: Vascular Surgery

## 2015-10-15 NOTE — Telephone Encounter (Signed)
-----   Message from Mena Goes, RN sent at 10/14/2015 11:11 AM EDT ----- Regarding: schedule 4-6 weeks w/ duplex   ----- Message ----- From: Ulyses Amor, PA-C Sent: 10/14/2015  10:25 AM To: [facility] Charge Pool  F/U in 4-6 weeks s/p av fistula needs duplex Dr. Scot Dock

## 2015-10-15 NOTE — Telephone Encounter (Signed)
LVM on home #, work # no VM set up, for appt on 11/26/15

## 2015-10-15 NOTE — Telephone Encounter (Signed)
Mena Goes, RN  P [facility] Admin Pool      Previous Messages    ----- Message -----  From: Angelia Mould, MD  Sent: 10/14/2015 10:05 AM  To: [facility] Charge Pool  Subject: charge and f/u                  PROCEDURE: Right brachiocephalic AV fistula   SURGEON: Judeth Cornfield. Scot Dock, MD, FACS   ASSIST: Gerri Lins PA   He will need a follow up visit in 6 weeks with a duplex at that time. Thank you.  CD

## 2015-11-03 ENCOUNTER — Ambulatory Visit: Payer: BLUE CROSS/BLUE SHIELD

## 2015-11-24 ENCOUNTER — Other Ambulatory Visit: Payer: Self-pay | Admitting: *Deleted

## 2015-11-24 DIAGNOSIS — N186 End stage renal disease: Secondary | ICD-10-CM

## 2015-11-24 DIAGNOSIS — Z4931 Encounter for adequacy testing for hemodialysis: Secondary | ICD-10-CM

## 2015-11-25 ENCOUNTER — Ambulatory Visit (HOSPITAL_COMMUNITY)
Admission: RE | Admit: 2015-11-25 | Discharge: 2015-11-25 | Disposition: A | Discharge status: Home / Self Care | Payer: BLUE CROSS/BLUE SHIELD | Source: Ambulatory Visit | Attending: Vascular Surgery | Admitting: Vascular Surgery

## 2015-11-25 DIAGNOSIS — N186 End stage renal disease: Secondary | ICD-10-CM | POA: Insufficient documentation

## 2015-11-25 DIAGNOSIS — Z4931 Encounter for adequacy testing for hemodialysis: Secondary | ICD-10-CM

## 2015-11-26 ENCOUNTER — Encounter: Payer: Self-pay | Admitting: Vascular Surgery

## 2015-11-26 ENCOUNTER — Ambulatory Visit: Payer: BLUE CROSS/BLUE SHIELD | Admitting: Vascular Surgery

## 2015-11-26 ENCOUNTER — Encounter (HOSPITAL_COMMUNITY): Payer: BLUE CROSS/BLUE SHIELD

## 2015-11-27 ENCOUNTER — Encounter: Payer: Self-pay | Admitting: Vascular Surgery

## 2015-11-27 ENCOUNTER — Other Ambulatory Visit: Payer: Self-pay

## 2015-11-27 ENCOUNTER — Ambulatory Visit (INDEPENDENT_AMBULATORY_CARE_PROVIDER_SITE_OTHER): Payer: Self-pay | Admitting: Vascular Surgery

## 2015-11-27 VITALS — BP 125/72 | HR 86 | Temp 98.3°F | Resp 18 | Ht 72.0 in | Wt 177.1 lb

## 2015-11-27 DIAGNOSIS — Z48812 Encounter for surgical aftercare following surgery on the circulatory system: Secondary | ICD-10-CM

## 2015-11-27 NOTE — Progress Notes (Signed)
Patient name: Kyle [name] MRN: [id number] DOB: 1955-04-06 Sex: male  REASON FOR VISIT: Follow up after right brachiocephalic AV fistula  HPI: Kyle [name] is a 60 y.o. male who underwent a right brachiocephalic AV fistula on 50/53/9767. He comes in for 6 week follow up visit. At the time of surgery he was noted to have a 4 mm upper arm cephalic vein. The vein was fairly far lateral and therefore I had to mobilize vein into the forearm in order to have adequate length for anastomosis to the brachial artery.   he dialyzes on Monday Wednesdays and Fridays with his tunneled dialysis catheter. He comes in for routine follow up visit. He has no pain or paresthesias in his right upper extremity.  Current Outpatient Prescriptions  Medication Sig Dispense Refill  . aspirin 81 MG tablet Take 81 mg by mouth daily.    Marland Kitchen atorvastatin (LIPITOR) 40 MG tablet Take 1 tablet (40 mg total) by mouth daily. 30 tablet 11  . B Complex-C-Folic Acid (RENA-VITE RX) 1 MG TABS Take 1 tablet by mouth daily.  6  . carvedilol (COREG) 12.5 MG tablet Take 1 tablet (12.5 mg total) by mouth 2 (two) times daily with a meal. 180 tablet 3  . cephALEXin (KEFLEX) 500 MG capsule Take 1 capsule (500 mg total) by mouth daily. 30 capsule 11  . doxercalciferol (HECTOROL) 4 MCG/2ML injection Inject 2.5 mLs (5 mcg total) into the vein Every Tuesday,Thursday,and Saturday with dialysis. 2 mL 12  . glimepiride (AMARYL) 2 MG tablet Take 1 tablet (2 mg total) by mouth daily with breakfast. 30 tablet 1  . lisinopril (PRINIVIL,ZESTRIL) 20 MG tablet Take 20 mg by mouth daily.   6  . NON FORMULARY Phosphorus binder, pt unsure of name    . oxyCODONE-acetaminophen (PERCOCET/ROXICET) 5-325 MG tablet Take 1 tablet by mouth every 6 (six) hours as needed. 6 tablet 0   No current facility-administered medications for this visit.     REVIEW OF SYSTEMS:  [X]  denotes positive finding, [ ]  denotes negative finding Cardiac  Comments:  Chest pain  or chest pressure:    Shortness of breath upon exertion:    Short of breath when lying flat:    Irregular heart rhythm:    Constitutional    Fever or chills:      PHYSICAL EXAM: Vitals:   11/27/15 1052  BP: 125/72  Pulse: 86  Resp: 18  Temp: 98.3 F (36.8 C)  TempSrc: Oral  SpO2: 99%  Weight: 177 lb 1.6 oz (80.3 kg)  Height: 6' (1.829 m)    GENERAL: The patient is a well-nourished male, in no acute distress. The vital signs are documented above. CARDIOVASCULAR: There is a regular rate and rhythm. PULMONARY: There is good air exchange bilaterally without wheezing or rales. His right upper arm fistula has a weak thrill.  DIALYSIS FISTULA DUPLEX: I have reviewed his duplex of his fistula that was done on 11/25/2015. The diameters of the fistula range from 0.43-0.54 cm. There is an area of increased velocity in the proximal fistula. There may be some partially occlusive thrombus in this area.  MEDICAL ISSUES:  POORLY MATURING RIGHT BRACHIOCEPHALIC AV FISTULA: His fistula is not maturing adequately. I think this is likely secondary to a proximal stenosis in the fistula. We will schedule him for a fistulogram on a nondialysis day. I have discussed the indications for the procedure and the potential complications and he is agreeable to proceed. In the meantime he will  continue to use his catheter.  [name] [facility] [phone]

## 2015-12-04 ENCOUNTER — Ambulatory Visit (HOSPITAL_COMMUNITY): Payer: BLUE CROSS/BLUE SHIELD | Admitting: Anesthesiology

## 2015-12-04 ENCOUNTER — Ambulatory Visit (HOSPITAL_COMMUNITY)
Admission: RE | Admit: 2015-12-04 | Discharge: 2015-12-04 | Disposition: A | Discharge status: Home / Self Care | Payer: BLUE CROSS/BLUE SHIELD | Source: Ambulatory Visit | Attending: Vascular Surgery | Admitting: Vascular Surgery

## 2015-12-04 ENCOUNTER — Encounter (HOSPITAL_COMMUNITY)
Admission: RE | Disposition: A | Discharge status: Home / Self Care | Payer: Self-pay | Source: Ambulatory Visit | Attending: Vascular Surgery

## 2015-12-04 ENCOUNTER — Encounter (HOSPITAL_COMMUNITY): Payer: Self-pay | Admitting: Anesthesiology

## 2015-12-04 DIAGNOSIS — Z792 Long term (current) use of antibiotics: Secondary | ICD-10-CM | POA: Insufficient documentation

## 2015-12-04 DIAGNOSIS — Z7982 Long term (current) use of aspirin: Secondary | ICD-10-CM | POA: Insufficient documentation

## 2015-12-04 DIAGNOSIS — Z79899 Other long term (current) drug therapy: Secondary | ICD-10-CM | POA: Diagnosis not present

## 2015-12-04 DIAGNOSIS — Z7984 Long term (current) use of oral hypoglycemic drugs: Secondary | ICD-10-CM | POA: Diagnosis not present

## 2015-12-04 DIAGNOSIS — Z992 Dependence on renal dialysis: Secondary | ICD-10-CM | POA: Diagnosis not present

## 2015-12-04 DIAGNOSIS — I120 Hypertensive chronic kidney disease with stage 5 chronic kidney disease or end stage renal disease: Secondary | ICD-10-CM | POA: Insufficient documentation

## 2015-12-04 DIAGNOSIS — Y832 Surgical operation with anastomosis, bypass or graft as the cause of abnormal reaction of the patient, or of later complication, without mention of misadventure at the time of the procedure: Secondary | ICD-10-CM | POA: Diagnosis not present

## 2015-12-04 DIAGNOSIS — N186 End stage renal disease: Secondary | ICD-10-CM | POA: Insufficient documentation

## 2015-12-04 DIAGNOSIS — Z87891 Personal history of nicotine dependence: Secondary | ICD-10-CM | POA: Insufficient documentation

## 2015-12-04 DIAGNOSIS — E1122 Type 2 diabetes mellitus with diabetic chronic kidney disease: Secondary | ICD-10-CM | POA: Diagnosis not present

## 2015-12-04 DIAGNOSIS — T82510A Breakdown (mechanical) of surgically created arteriovenous fistula, initial encounter: Secondary | ICD-10-CM | POA: Diagnosis present

## 2015-12-04 DIAGNOSIS — T82898A Other specified complication of vascular prosthetic devices, implants and grafts, initial encounter: Secondary | ICD-10-CM

## 2015-12-04 HISTORY — PX: PATCH ANGIOPLASTY: SHX6230

## 2015-12-04 HISTORY — PX: PERIPHERAL VASCULAR CATHETERIZATION: SHX172C

## 2015-12-04 HISTORY — PX: REVISON OF ARTERIOVENOUS FISTULA: SHX6074

## 2015-12-04 LAB — GLUCOSE, CAPILLARY
GLUCOSE-CAPILLARY: 74 mg/dL (ref 65–99)
Glucose-Capillary: 115 mg/dL — ABNORMAL HIGH (ref 65–99)
Glucose-Capillary: 76 mg/dL (ref 65–99)
Glucose-Capillary: 75 mg/dL (ref 65–99)

## 2015-12-04 LAB — POCT I-STAT, CHEM 8
BUN: 30 mg/dL — ABNORMAL HIGH (ref 6–20)
CALCIUM ION: 1.14 mmol/L — AB (ref 1.15–1.40)
CREATININE: 7.6 mg/dL — AB (ref 0.61–1.24)
Chloride: 98 mmol/L — ABNORMAL LOW (ref 101–111)
GLUCOSE: 69 mg/dL (ref 65–99)
HCT: 37 % — ABNORMAL LOW (ref 39.0–52.0)
HEMOGLOBIN: 12.6 g/dL — AB (ref 13.0–17.0)
POTASSIUM: 4.2 mmol/L (ref 3.5–5.1)
Sodium: 142 mmol/L (ref 135–145)
TCO2: 31 mmol/L (ref 0–100)

## 2015-12-04 SURGERY — REVISON OF ARTERIOVENOUS FISTULA
Anesthesia: Monitor Anesthesia Care | Site: Arm Upper | Laterality: Right

## 2015-12-04 SURGERY — A/V SHUNTOGRAM/FISTULAGRAM

## 2015-12-04 MED ORDER — LIDOCAINE HCL (PF) 1 % IJ SOLN
INTRAMUSCULAR | Status: DC | PRN
  Administered 2015-12-04: 5 mL

## 2015-12-04 MED ORDER — DEXTROSE 50 % IV SOLN
INTRAVENOUS | Status: AC
  Administered 2015-12-04: 25 mL via INTRAVENOUS
  Filled 2015-12-04: qty 50

## 2015-12-04 MED ORDER — IODIXANOL 320 MG/ML IV SOLN
INTRAVENOUS | Status: DC | PRN
  Administered 2015-12-04: 15 mL via INTRAVENOUS

## 2015-12-04 MED ORDER — PROPOFOL 500 MG/50ML IV EMUL
INTRAVENOUS | Status: DC | PRN
  Administered 2015-12-04: 30 ug/kg/min via INTRAVENOUS

## 2015-12-04 MED ORDER — HEPARIN (PORCINE) IN NACL 2-0.9 UNIT/ML-% IJ SOLN
INTRAMUSCULAR | Status: AC
  Filled 2015-12-04: qty 500

## 2015-12-04 MED ORDER — LIDOCAINE HCL (PF) 1 % IJ SOLN
INTRAMUSCULAR | Status: AC
  Filled 2015-12-04: qty 30

## 2015-12-04 MED ORDER — HYDROMORPHONE HCL 1 MG/ML IJ SOLN: 0.2500 mg | INTRAMUSCULAR | Status: DC | PRN

## 2015-12-04 MED ORDER — FENTANYL CITRATE (PF) 100 MCG/2ML IJ SOLN
INTRAMUSCULAR | Status: DC | PRN
  Administered 2015-12-04 (×2): 50 ug via INTRAVENOUS

## 2015-12-04 MED ORDER — DEXTROSE 50 % IV SOLN: 1.0000 | Freq: Once | INTRAVENOUS | Status: DC

## 2015-12-04 MED ORDER — LIDOCAINE HCL (PF) 1 % IJ SOLN
INTRAMUSCULAR | Status: DC | PRN
  Administered 2015-12-04: 30 mL

## 2015-12-04 MED ORDER — SODIUM CHLORIDE 0.9 % IV SOLN
INTRAVENOUS | Status: DC | PRN
  Administered 2015-12-04: 500 mL

## 2015-12-04 MED ORDER — MIDAZOLAM HCL 2 MG/2ML IJ SOLN
INTRAMUSCULAR | Status: AC
  Filled 2015-12-04: qty 2

## 2015-12-04 MED ORDER — FENTANYL CITRATE (PF) 250 MCG/5ML IJ SOLN
INTRAMUSCULAR | Status: AC
  Filled 2015-12-04: qty 5

## 2015-12-04 MED ORDER — SODIUM CHLORIDE 0.9% FLUSH: 3.0000 mL | INTRAVENOUS | Status: DC | PRN

## 2015-12-04 MED ORDER — HEPARIN SODIUM (PORCINE) 1000 UNIT/ML IJ SOLN
INTRAMUSCULAR | Status: DC | PRN
  Administered 2015-12-04: 8000 [IU] via INTRAVENOUS

## 2015-12-04 MED ORDER — PROMETHAZINE HCL 25 MG/ML IJ SOLN: 6.2500 mg | INTRAMUSCULAR | Status: DC | PRN

## 2015-12-04 MED ORDER — CEFAZOLIN SODIUM 1 G IJ SOLR
INTRAMUSCULAR | Status: DC | PRN
  Administered 2015-12-04: 2 g via INTRAMUSCULAR

## 2015-12-04 MED ORDER — PROPOFOL 10 MG/ML IV BOLUS
INTRAVENOUS | Status: AC
  Filled 2015-12-04: qty 40

## 2015-12-04 MED ORDER — LIDOCAINE 2% (20 MG/ML) 5 ML SYRINGE
INTRAMUSCULAR | Status: AC
  Filled 2015-12-04: qty 5

## 2015-12-04 MED ORDER — DEXTROSE-NACL 5-0.45 % IV SOLN: INTRAVENOUS | Status: DC

## 2015-12-04 MED ORDER — DEXTROSE 50 % IV SOLN
25.0000 mL | Freq: Once | INTRAVENOUS | Status: AC
  Administered 2015-12-04: 25 mL via INTRAVENOUS
  Filled 2015-12-04: qty 50

## 2015-12-04 MED ORDER — OXYCODONE-ACETAMINOPHEN 5-325 MG PO TABS: 1.0000 | ORAL_TABLET | Freq: Four times a day (QID) | ORAL | 0 refills | Status: DC | PRN

## 2015-12-04 MED ORDER — 0.9 % SODIUM CHLORIDE (POUR BTL) OPTIME
TOPICAL | Status: DC | PRN
  Administered 2015-12-04: 1000 mL

## 2015-12-04 MED ORDER — DOXERCALCIFEROL 4 MCG/2ML IV SOLN: 5.0000 ug | INTRAVENOUS | Status: DC

## 2015-12-04 MED ORDER — EPHEDRINE SULFATE 50 MG/ML IJ SOLN
INTRAMUSCULAR | Status: DC | PRN
  Administered 2015-12-04: 5 mg via INTRAVENOUS
  Administered 2015-12-04 (×3): 10 mg via INTRAVENOUS

## 2015-12-04 MED ORDER — EPHEDRINE 5 MG/ML INJ
INTRAVENOUS | Status: AC
  Filled 2015-12-04: qty 10

## 2015-12-04 MED ORDER — SODIUM CHLORIDE 0.9 % IV SOLN
INTRAVENOUS | Status: DC | PRN
  Administered 2015-12-04: 11:00:00 via INTRAVENOUS

## 2015-12-04 MED ORDER — DEXTROSE 50 % IV SOLN
25.0000 mL | Freq: Once | INTRAVENOUS | Status: AC
  Administered 2015-12-04: 25 mL via INTRAVENOUS

## 2015-12-04 MED ORDER — HEPARIN (PORCINE) IN NACL 2-0.9 UNIT/ML-% IJ SOLN
INTRAMUSCULAR | Status: DC | PRN
  Administered 2015-12-04: 500 mL

## 2015-12-04 MED ORDER — MIDAZOLAM HCL 5 MG/5ML IJ SOLN
INTRAMUSCULAR | Status: DC | PRN
  Administered 2015-12-04 (×2): 1 mg via INTRAVENOUS

## 2015-12-04 MED ORDER — PROTAMINE SULFATE 10 MG/ML IV SOLN
INTRAVENOUS | Status: DC | PRN
  Administered 2015-12-04: 40 mg via INTRAVENOUS

## 2015-12-04 SURGICAL SUPPLY — 10 items
BAG SNAP BAND KOVER 36X36 (MISCELLANEOUS) ×3 IMPLANT
COVER DOME SNAP 22 D (MISCELLANEOUS) ×3 IMPLANT
COVER PRB 48X5XTLSCP FOLD TPE (BAG) ×1 IMPLANT
COVER PROBE 5X48 (BAG) ×6
KIT MICROINTRODUCER STIFF 5F (SHEATH) ×2 IMPLANT
PROTECTION STATION PRESSURIZED (MISCELLANEOUS) ×3
STATION PROTECTION PRESSURIZED (MISCELLANEOUS) ×1 IMPLANT
STOPCOCK MORSE 400PSI 3WAY (MISCELLANEOUS) ×5 IMPLANT
TRAY PV CATH (CUSTOM PROCEDURE TRAY) ×3 IMPLANT
TUBING CIL FLEX 10 FLL-RA (TUBING) ×3 IMPLANT

## 2015-12-04 SURGICAL SUPPLY — 33 items
ADH SKN CLS APL DERMABOND .7 (GAUZE/BANDAGES/DRESSINGS) ×1
AGENT HMST SPONGE THK3/8 (HEMOSTASIS)
ARMBAND PINK RESTRICT EXTREMIT (MISCELLANEOUS) ×3 IMPLANT
CANISTER SUCTION 2500CC (MISCELLANEOUS) ×3 IMPLANT
CLIP TI MEDIUM 6 (CLIP) ×3 IMPLANT
CLIP TI WIDE RED SMALL 6 (CLIP) ×3 IMPLANT
COVER PROBE W GEL 5X96 (DRAPES) IMPLANT
DECANTER SPIKE VIAL GLASS SM (MISCELLANEOUS) ×1 IMPLANT
DERMABOND ADVANCED (GAUZE/BANDAGES/DRESSINGS) ×2
DERMABOND ADVANCED .7 DNX12 (GAUZE/BANDAGES/DRESSINGS) ×1 IMPLANT
DRAIN PENROSE 1/2X12 LTX STRL (WOUND CARE) IMPLANT
ELECT REM PT RETURN 9FT ADLT (ELECTROSURGICAL) ×3
ELECTRODE REM PT RTRN 9FT ADLT (ELECTROSURGICAL) ×1 IMPLANT
GLOVE BIO SURGEON STRL SZ7 (GLOVE) ×3 IMPLANT
GLOVE BIOGEL PI IND STRL 7.5 (GLOVE) ×1 IMPLANT
GLOVE BIOGEL PI INDICATOR 7.5 (GLOVE) ×2
GOWN STRL REUS W/ TWL LRG LVL3 (GOWN DISPOSABLE) ×3 IMPLANT
GOWN STRL REUS W/TWL LRG LVL3 (GOWN DISPOSABLE) ×9
HEMOSTAT SPONGE AVITENE ULTRA (HEMOSTASIS) IMPLANT
KIT BASIN OR (CUSTOM PROCEDURE TRAY) ×3 IMPLANT
KIT ROOM TURNOVER OR (KITS) ×3 IMPLANT
NS IRRIG 1000ML POUR BTL (IV SOLUTION) ×3 IMPLANT
PACK CV ACCESS (CUSTOM PROCEDURE TRAY) ×3 IMPLANT
PAD ARMBOARD 7.5X6 YLW CONV (MISCELLANEOUS) ×6 IMPLANT
PATCH VASC XENOSURE 1CMX6CM (Vascular Products) ×3 IMPLANT
PATCH VASC XENOSURE 1X6 (Vascular Products) IMPLANT
SUT MNCRL AB 4-0 PS2 18 (SUTURE) ×3 IMPLANT
SUT PROLENE 6 0 BV (SUTURE) ×4 IMPLANT
SUT PROLENE 7 0 BV 1 (SUTURE) ×3 IMPLANT
SUT VIC AB 3-0 SH 27 (SUTURE) ×3
SUT VIC AB 3-0 SH 27X BRD (SUTURE) ×1 IMPLANT
UNDERPAD 30X30 (UNDERPADS AND DIAPERS) ×3 IMPLANT
WATER STERILE IRR 1000ML POUR (IV SOLUTION) ×3 IMPLANT

## 2015-12-04 NOTE — Anesthesia Preprocedure Evaluation (Addendum)
Anesthesia Evaluation  Patient identified by MRN, date of birth, ID band Patient awake    Reviewed: Allergy & Precautions, H&P , NPO status   Airway Mallampati: II  TM Distance: >3 FB     Dental  (+) Teeth Intact, Dental Advidsory Given   Pulmonary former smoker,    breath sounds clear to auscultation       Cardiovascular hypertension, + CAD and +CHF   Rhythm:Regular Rate:Normal     Neuro/Psych negative neurological ROS  negative psych ROS   GI/Hepatic negative GI ROS, Neg liver ROS,   Endo/Other  diabetes  Renal/GU ESRFRenal disease     Musculoskeletal   Abdominal   Peds  Hematology  (+) anemia ,   Anesthesia Other Findings Study Conclusions  - Left ventricle: The cavity size was normal. Wall thickness was   normal. Systolic function was normal. The estimated ejection   fraction was in the range of 55% to 60%. Wall motion was normal;   there were no regional wall motion abnormalities. Doppler   parameters are consistent with abnormal left ventricular   relaxation (grade 1 diastolic dysfunction). - Left atrium: The atrium was mildly dilated.  Impressions:  - Normal LV systolic function; grade 1 diastolic dysfunction; trace   MR; mild LAE.  Reproductive/Obstetrics                             Anesthesia Physical  Anesthesia Plan  ASA: III  Anesthesia Plan: MAC   Post-op Pain Management:    Induction: Intravenous  Airway Management Planned: Natural Airway and Simple Face Mask  Additional Equipment:   Intra-op Plan:   Post-operative Plan:   Informed Consent: I have reviewed the patients History and Physical, chart, labs and discussed the procedure including the risks, benefits and alternatives for the proposed anesthesia with the patient or authorized representative who has indicated his/her understanding and acceptance.   Dental Advisory Given and Dental advisory  given  Plan Discussed with: Anesthesiologist, CRNA and Surgeon  Anesthesia Plan Comments:        Anesthesia Quick Evaluation

## 2015-12-04 NOTE — Op Note (Signed)
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  Improved right brachiocephalic arteriovenous fistula thrill at the end of the case  SPECIMEN(S):  none  INDICATIONS:   Kyle [name] is a 60 y.o. male who presents with subtotal occlusion of his right brachiocephalic arteriovenous fistula on right arm fistulogram earlier today.  I recommended: revision of right brachiocephalic arteriovenous fistula to try to salvage this right arm fistula.  Risk, benefits, and alternatives to access surgery were discussed.  The patient is aware the risks include but are not limited to: bleeding, infection, steal syndrome, nerve damage, ischemic monomelic neuropathy, failure to mature, need for additional procedures, death and stroke.  The patient agrees to proceed forward with the procedure. .  DESCRIPTION: After obtaining full informed written consent, the patient was brought back to the operating room and placed supine upon the operating table.  The patient received IV antibiotics prior to induction.  After obtaining adequate anesthesia, the patient was prepped and draped in the standard fashion for: right arm access procedure.  I injected 8 cc of 1% lidocaine without epinephrine over the distal fistula.  I made an incision overlying the distal fistula, centered on the prior puncture.  I removed the prior Monocryl stitch and then dissected out the fistula proximal and distal to the prior puncture.  It required more dissection than  usually to identify the puncture in the fistula.  I placed a 7-0 prolene stitch in the fistula to control the puncture site.  The patient was given 8000 units of Heparin intravenously, which was a therapeutic bolus.    After waiting 3 minutes, I place the fistula under tension proximally and distally.  I made a venotomy in the fistula and extended it proximally and distally with a Potts scissor.  The lumen in the distal fistula was nearly occluded with extensive neointimal hyperplasia.  I debrided this hyperplasia sharply with a tonotomy scissor.  In this process, I identified several sclerotic valves.  I debrided all these valves flush to the wall.  Eventually, I had debrided as much of the neointimal hyperplasia and sclerotic valve as I felt the wall could sustained.  I obtained a bovine percardial patch and fashioned it for the venotomy in this distal fistula.  I sewed the patch onto the fistula with a running stitch of 6-0 Prolene with a continuous stitch.  Prior to completing this anastomosis, I backbled both ends of the fistula.  There was no clot.  I completed this patch angioplasty in the usual fashion.  I removed all vessel loops and immediately there was an improved thrill in this right brachiocephalic arteriovenous fistula.    I gave the patient 40 mg of Protamine to reverse anticoagulation.  I reapproximated the subcutaneous tissue with a double layer of 3-0 Vicryl.  The skin was then reapproximated with a running stitch of 4-0 Monocryl.  The skin was cleaned, dried, and reinforced with Dermabond.   COMPLICATIONS: none  CONDITION: stable   Adele Barthel, MD [facility] [place] Office: [phone] Pager: [phone]  12/04/2015, 1:00 PM

## 2015-12-04 NOTE — H&P (View-Only) (Signed)
Patient name: Kyle [name] MRN: [id number] DOB: 1956/01/10 Sex: male  REASON FOR VISIT: Follow up after right brachiocephalic AV fistula  HPI: Kyle [name] is a 60 y.o. male who underwent a right brachiocephalic AV fistula on 56/21/3086. He comes in for 6 week follow up visit. At the time of surgery he was noted to have a 4 mm upper arm cephalic vein. The vein was fairly far lateral and therefore I had to mobilize vein into the forearm in order to have adequate length for anastomosis to the brachial artery.   he dialyzes on Monday Wednesdays and Fridays with his tunneled dialysis catheter. He comes in for routine follow up visit. He has no pain or paresthesias in his right upper extremity.  Current Outpatient Prescriptions  Medication Sig Dispense Refill  . aspirin 81 MG tablet Take 81 mg by mouth daily.    Marland Kitchen atorvastatin (LIPITOR) 40 MG tablet Take 1 tablet (40 mg total) by mouth daily. 30 tablet 11  . B Complex-C-Folic Acid (RENA-VITE RX) 1 MG TABS Take 1 tablet by mouth daily.  6  . carvedilol (COREG) 12.5 MG tablet Take 1 tablet (12.5 mg total) by mouth 2 (two) times daily with a meal. 180 tablet 3  . cephALEXin (KEFLEX) 500 MG capsule Take 1 capsule (500 mg total) by mouth daily. 30 capsule 11  . doxercalciferol (HECTOROL) 4 MCG/2ML injection Inject 2.5 mLs (5 mcg total) into the vein Every Tuesday,Thursday,and Saturday with dialysis. 2 mL 12  . glimepiride (AMARYL) 2 MG tablet Take 1 tablet (2 mg total) by mouth daily with breakfast. 30 tablet 1  . lisinopril (PRINIVIL,ZESTRIL) 20 MG tablet Take 20 mg by mouth daily.   6  . NON FORMULARY Phosphorus binder, pt unsure of name    . oxyCODONE-acetaminophen (PERCOCET/ROXICET) 5-325 MG tablet Take 1 tablet by mouth every 6 (six) hours as needed. 6 tablet 0   No current facility-administered medications for this visit.     REVIEW OF SYSTEMS:  [X]  denotes positive finding, [ ]  denotes negative finding Cardiac  Comments:  Chest pain  or chest pressure:    Shortness of breath upon exertion:    Short of breath when lying flat:    Irregular heart rhythm:    Constitutional    Fever or chills:      PHYSICAL EXAM: Vitals:   11/27/15 1052  BP: 125/72  Pulse: 86  Resp: 18  Temp: 98.3 F (36.8 C)  TempSrc: Oral  SpO2: 99%  Weight: 177 lb 1.6 oz (80.3 kg)  Height: 6' (1.829 m)    GENERAL: The patient is a well-nourished male, in no acute distress. The vital signs are documented above. CARDIOVASCULAR: There is a regular rate and rhythm. PULMONARY: There is good air exchange bilaterally without wheezing or rales. His right upper arm fistula has a weak thrill.  DIALYSIS FISTULA DUPLEX: I have reviewed his duplex of his fistula that was done on 11/25/2015. The diameters of the fistula range from 0.43-0.54 cm. There is an area of increased velocity in the proximal fistula. There may be some partially occlusive thrombus in this area.  MEDICAL ISSUES:  POORLY MATURING RIGHT BRACHIOCEPHALIC AV FISTULA: His fistula is not maturing adequately. I think this is likely secondary to a proximal stenosis in the fistula. We will schedule him for a fistulogram on a nondialysis day. I have discussed the indications for the procedure and the potential complications and he is agreeable to proceed. In the meantime he will  continue to use his catheter.  [name] [facility] [place] [phone]

## 2015-12-04 NOTE — Transfer of Care (Signed)
Immediate Anesthesia Transfer of Care Note  Patient: Kyle [name]  Procedure(s) Performed: Procedure(s): REVISON OF RIGHT BRACHIO--CEPHALIC  ARTERIOVENOUS FISTULA (Right) WITH 1 X 6 CM XENOSURE BIOLOGIC PATCH ANGIOPLASTY TO RIGHT ARM BRACHIO-CEPHALIC FISTULA (Right)  Patient Location: PACU  Anesthesia Type:MAC  Level of Consciousness: awake, alert , oriented and sedated  Airway & Oxygen Therapy: Patient Spontanous Breathing and Patient connected to nasal cannula oxygen  Post-op Assessment: Report given to RN, Post -op Vital signs reviewed and stable and Patient moving all extremities X 4  Post vital signs: Reviewed and stable  Last Vitals:  Vitals:   12/04/15 0920 12/04/15 0929  BP:  126/71  Pulse: 63 64  Resp:    Temp:      Last Pain:  Vitals:   12/04/15 0818  TempSrc: Oral         Complications: No apparent anesthesia complications

## 2015-12-04 NOTE — Interval H&P Note (Signed)
[facility]  History and Physical Update  The patient was interviewed and re-examined.  [name] previous History and Physical has been reviewed and is unchanged from my update earlier today except for interval right arm fistulogram.  Based on the images, the right brachiocephalic arteriovenous fistula has a sub-total occlusion of the fistula, which I offered revision of the fistula to the patient.  He has agreed to proceed.     Risk, benefits, and alternatives to access surgery were discussed.    The patient is aware the risks include but are not limited to: bleeding, infection, steal syndrome, nerve damage, ischemic monomelic neuropathy, failure to mature, need for additional procedures, death and stroke.    The patient agrees to proceed forward with the procedure.  Adele Barthel, MD [facility] [place] Office: [phone] Pager: [phone]  12/04/2015, 9:50 AM

## 2015-12-04 NOTE — Anesthesia Postprocedure Evaluation (Signed)
Anesthesia Post Note  Patient: Kyle [name]  Procedure(s) Performed: Procedure(s) (LRB): REVISON OF RIGHT BRACHIO--CEPHALIC  ARTERIOVENOUS FISTULA (Right) WITH 1 X 6 CM XENOSURE BIOLOGIC PATCH ANGIOPLASTY TO RIGHT ARM BRACHIO-CEPHALIC FISTULA (Right)  Patient location during evaluation: PACU Anesthesia Type: MAC Level of consciousness: awake and alert Pain management: pain level controlled Vital Signs Assessment: post-procedure vital signs reviewed and stable Respiratory status: spontaneous breathing, nonlabored ventilation and respiratory function stable Cardiovascular status: stable and blood pressure returned to baseline Anesthetic complications: no    Last Vitals:  Vitals:   12/04/15 1415 12/04/15 1422  BP:  (!) 149/78  Pulse: 71 68  Resp:  19  Temp:      Last Pain:  Vitals:   12/04/15 1337  TempSrc:   PainSc: 0-No pain                 Eeshan Verbrugge,W. EDMOND

## 2015-12-04 NOTE — Op Note (Signed)
    OPERATIVE NOTE   PROCEDURE: 1. right brachiocephalic arteriovenous fistula cannulation under ultrasound guidance 2. right arm fistulogram  PRE-OPERATIVE DIAGNOSIS: Malfunctioning right brachiocephalic arteriovenous fistula  POST-OPERATIVE DIAGNOSIS: same as above   SURGEON: Adele Barthel, MD  ANESTHESIA: local  ESTIMATED BLOOD LOSS: 5 cc  FINDING(S): 1. Patent fistula throughout with nearly total occlusion just distal to cannulation site 2. Widely patent central veins with tunneled dialysis catheter in right internal jugular vein   SPECIMEN(S):  None  CONTRAST: 15 cc  INDICATIONS: Kyle [name] is a 60 y.o. male who  presents with malfunctioning right brachiocephalic arteriovenous fistula.  The patient is scheduled for right arm fistulogram.  The patient is aware the risks include but are not limited to: bleeding, infection, thrombosis of the cannulated access, and possible anaphylactic reaction to the contrast.  The patient is aware of the risks of the procedure and elects to proceed forward.  DESCRIPTION: After full informed written consent was obtained, the patient was brought back to the angiography suite and placed supine upon the angiography table.  The patient was connected to monitoring equipment.  The right arm was prepped and draped in the standard fashion for a right arm fistulogram.  Under ultrasound guidance, the right brachiocephalic arteriovenous fistula was cannulated with a micropuncture needle.  The microwire was advanced into the fistula and the needle was exchanged for the a microsheath, which was lodged 2 cm into the access.  The wire was removed and the sheath was connected to the IV extension tubing.  Hand injections were completed to image the access from the antecubitum up to the level of axilla.  The central venous structures were also imaged by hand injections.  Based on the images, this patient will need: surgical revision of the right brachiocephalic  arteriovenous fistula .  A 4-0 Monocryl purse-string suture was sewn around the sheath.  The sheath was removed while tying down the suture.  A sterile bandage was applied to the puncture site.  I offered to revise the fistula today given the near total occlusion of the right brachiocephalic arteriovenous fistula.  Depending on the laxity of the vein, would consider either excision of disease segment vs bovine patch angioplasty of the segment.   COMPLICATIONS: none  CONDITION: stable   Adele Barthel, MD [facility] [place] Office: [phone] Pager: [phone]  12/04/2015 8:24 AM

## 2015-12-04 NOTE — Interval H&P Note (Signed)
[facility]  History and Physical Update  The patient was interviewed and re-examined.  [name] previous History and Physical has been reviewed and is unchanged from Dr. Nicole Cella consult .  There is no change in the plan of care: Right arm fistulogram, possible intervention.   I discussed with the patient the nature of angiographic procedures, especially the limited patencies of any endovascular intervention.    The patient is aware of that the risks of an angiographic procedure include but are not limited to: bleeding, infection, access site complications, renal failure, embolization, rupture of vessel, dissection, arteriovenous fistula, possible need for emergent surgical intervention, possible need for surgical procedures to treat the patient's pathology, anaphylactic reaction to contrast, and stroke and death.    The patient is aware of the risks and agrees to proceed.   Adele Barthel, MD [facility] [place] Office: [phone] Pager: [phone]  12/04/2015, 7:04 AM

## 2015-12-05 ENCOUNTER — Encounter (HOSPITAL_COMMUNITY): Payer: Self-pay | Admitting: Vascular Surgery

## 2015-12-24 ENCOUNTER — Encounter: Payer: Self-pay | Admitting: Vascular Surgery

## 2015-12-25 NOTE — Progress Notes (Deleted)
    Postoperative Access Visit   History of Present Illness  Kyle [name] is a 60 y.o. year old male who presents for postoperative follow-up for: Endarterectomy and bovine patch angioplasty of right brachiocephalic arteriovenous fistula   (Date: 12/04/15).  [name] wounds are *** healed.  The patient notes *** steal symptoms.  The patient is *** able to complete their activities of daily living.  [name] current symptoms are: ***.  For VQI Use Only  PRE-ADM LIVING: {VQI Pre-admission Living:20973}  AMB STATUS: {VQI Ambulatory Status:20974}  Physical Examination There were no vitals filed for this visit.  RUE: Incision is *** healed, skin feels ***, hand grip is ***/5, sensation in digits is *** intact, ***palpable thrill, bruit can *** be auscultated   Medical Decision Making  Kyle [name] is a 60 y.o. year old male who presents s/p Endarterectomy and bovine patch angioplasty of right brachiocephalic arteriovenous fistula     [name] access is *** ready for use.  [name] tunneled dialysis catheter can be removed after two successful cannulations and completed dialysis treatments.  Thank you for allowing [place] to participate in this patient's care.  Adele Barthel, MD, FACS [facility] [place] Office: [phone] Pager: [phone]

## 2015-12-26 ENCOUNTER — Encounter: Payer: BLUE CROSS/BLUE SHIELD | Admitting: Vascular Surgery

## 2016-01-02 ENCOUNTER — Encounter: Payer: BLUE CROSS/BLUE SHIELD | Admitting: Vascular Surgery

## 2016-01-16 ENCOUNTER — Encounter: Payer: Self-pay | Admitting: Vascular Surgery

## 2016-01-23 ENCOUNTER — Other Ambulatory Visit: Payer: Self-pay | Admitting: Cardiology

## 2016-01-23 ENCOUNTER — Other Ambulatory Visit: Payer: Self-pay | Admitting: Physician Assistant

## 2016-01-23 DIAGNOSIS — I10 Essential (primary) hypertension: Secondary | ICD-10-CM

## 2016-01-30 ENCOUNTER — Ambulatory Visit (INDEPENDENT_AMBULATORY_CARE_PROVIDER_SITE_OTHER): Payer: Self-pay | Admitting: Vascular Surgery

## 2016-01-30 VITALS — BP 114/68 | HR 85 | Temp 98.0°F | Resp 16 | Ht 72.0 in | Wt 179.0 lb

## 2016-01-30 DIAGNOSIS — N186 End stage renal disease: Secondary | ICD-10-CM

## 2016-01-30 DIAGNOSIS — Z992 Dependence on renal dialysis: Secondary | ICD-10-CM

## 2016-01-30 NOTE — Progress Notes (Signed)
   Patient name: Kyle [name] MRN: [id number] DOB: 1955/06/05 Sex: male  REASON FOR VISIT: Postop  HPI: Kyle [name] is a 61 y.o. male who presents for postoperative follow-up status post endarterectomy and bovine patch angioplasty of his right brachiocephalic AV fistula on 76/01/9507 by Dr. Bridgett Larsson. This was performed for a poorly maturing fistula. His fistulogram earlier that day revealed a near-total occlusion just distal to the cannulation site. His fistula was originally created on 10/14/2015. He is using a tunneled dialysis catheter for dialysis.   He denies any pain or paresthesias in his right hand.  Current Outpatient Prescriptions  Medication Sig Dispense Refill  . aspirin 81 MG tablet Take 81 mg by mouth daily.    Marland Kitchen atorvastatin (LIPITOR) 40 MG tablet TAKE 1 TABLET(40 MG) BY MOUTH DAILY 90 tablet 0  . B Complex-C-Folic Acid (RENA-VITE RX) 1 MG TABS Take 1 tablet by mouth daily.  6  . carvedilol (COREG) 12.5 MG tablet Take 1 tablet (12.5 mg total) by mouth 2 (two) times daily with a meal. 180 tablet 3  . doxercalciferol (HECTOROL) 4 MCG/2ML injection Inject 2.5 mLs (5 mcg total) into the vein every Monday, Wednesday, and Friday with hemodialysis.    Marland Kitchen glimepiride (AMARYL) 2 MG tablet Take 1 tablet (2 mg total) by mouth daily with breakfast. 30 tablet 1  . lisinopril (PRINIVIL,ZESTRIL) 20 MG tablet Take 20 mg by mouth daily.   6  . NON FORMULARY Phosphorus binder, pt unsure of name    . oxyCODONE-acetaminophen (PERCOCET/ROXICET) 5-325 MG tablet Take 1 tablet by mouth every 6 (six) hours as needed. 6 tablet 0  . oxyCODONE-acetaminophen (ROXICET) 5-325 MG tablet Take 1 tablet by mouth every 6 (six) hours as needed for moderate pain. 10 tablet 0   Current Facility-Administered Medications  Medication Dose Route Frequency Provider Last Rate Last Dose  . dextrose 5 %-0.45 % sodium chloride infusion   Intravenous Continuous Conrad [place], MD        REVIEW OF SYSTEMS:  [X]  denotes  positive finding, [ ]  denotes negative finding Cardiac  Comments:  Chest pain or chest pressure:    Shortness of breath upon exertion:    Short of breath when lying flat:    Irregular heart rhythm:    Constitutional    Fever or chills:      PHYSICAL EXAM: Vitals:   01/30/16 1317  BP: 114/68  Pulse: 85  Resp: 16  Temp: 98 F (36.7 C)  TempSrc: Oral  SpO2: 97%  Weight: 179 lb (81.2 kg)  Height: 6' (1.829 m)    GENERAL: The patient is a well-nourished male, in no acute distress. The vital signs are documented above. VASCULAR: 2+ right radial pulse. Antecubital incision is well-healed. Easily visible right upper arm fistula. Palpable thrill and audible bruit throughout.  MEDICAL ISSUES: Status post revision right brachiocephalic AV fistula  Well matured right upper arm fistula. The patient may start using his fistula for dialysis. [name] tunneled dialysis catheter can be removed after two successful cannulations and completed dialysis treatments. He will follow up prn.   Virgina Jock, PA-C [facility] MD: Bridgett Larsson

## 2016-04-22 DIAGNOSIS — N186 End stage renal disease: Secondary | ICD-10-CM | POA: Diagnosis present

## 2016-04-22 DIAGNOSIS — Z992 Dependence on renal dialysis: Secondary | ICD-10-CM

## 2016-04-22 HISTORY — DX: End stage renal disease: N18.6

## 2016-04-22 HISTORY — DX: Dependence on renal dialysis: Z99.2

## 2016-04-24 ENCOUNTER — Other Ambulatory Visit: Payer: Self-pay | Admitting: Internal Medicine

## 2016-05-11 DIAGNOSIS — R188 Other ascites: Secondary | ICD-10-CM

## 2016-05-11 DIAGNOSIS — I2510 Atherosclerotic heart disease of native coronary artery without angina pectoris: Secondary | ICD-10-CM

## 2016-05-11 DIAGNOSIS — I10 Essential (primary) hypertension: Secondary | ICD-10-CM

## 2016-05-11 DIAGNOSIS — I469 Cardiac arrest, cause unspecified: Secondary | ICD-10-CM | POA: Diagnosis present

## 2016-05-11 HISTORY — DX: Other ascites: R18.8

## 2016-05-11 HISTORY — DX: Essential (primary) hypertension: I10

## 2016-05-11 HISTORY — DX: Atherosclerotic heart disease of native coronary artery without angina pectoris: I25.10

## 2016-05-29 ENCOUNTER — Other Ambulatory Visit: Payer: Self-pay | Admitting: Internal Medicine

## 2016-07-09 ENCOUNTER — Other Ambulatory Visit: Payer: Self-pay | Admitting: *Deleted

## 2016-07-09 DIAGNOSIS — I10 Essential (primary) hypertension: Secondary | ICD-10-CM

## 2016-07-09 MED ORDER — CARVEDILOL 12.5 MG PO TABS: 12.5000 mg | ORAL_TABLET | Freq: Two times a day (BID) | ORAL | 0 refills | Status: AC

## 2016-07-09 MED ORDER — ATORVASTATIN CALCIUM 40 MG PO TABS: 40.0000 mg | ORAL_TABLET | Freq: Every day | ORAL | 0 refills | Status: DC

## 2016-07-09 NOTE — Telephone Encounter (Signed)
REFILL 

## 2016-07-31 DIAGNOSIS — Z136 Encounter for screening for cardiovascular disorders: Secondary | ICD-10-CM

## 2016-07-31 DIAGNOSIS — T82818A Embolism of vascular prosthetic devices, implants and grafts, initial encounter: Secondary | ICD-10-CM | POA: Insufficient documentation

## 2016-07-31 DIAGNOSIS — Z4931 Encounter for adequacy testing for hemodialysis: Secondary | ICD-10-CM

## 2016-07-31 DIAGNOSIS — I10 Essential (primary) hypertension: Secondary | ICD-10-CM

## 2016-07-31 DIAGNOSIS — D509 Iron deficiency anemia, unspecified: Secondary | ICD-10-CM

## 2016-07-31 DIAGNOSIS — Z111 Encounter for screening for respiratory tuberculosis: Secondary | ICD-10-CM

## 2016-07-31 DIAGNOSIS — Z23 Encounter for immunization: Secondary | ICD-10-CM | POA: Insufficient documentation

## 2016-07-31 DIAGNOSIS — N2581 Secondary hyperparathyroidism of renal origin: Secondary | ICD-10-CM

## 2016-07-31 DIAGNOSIS — E1129 Type 2 diabetes mellitus with other diabetic kidney complication: Secondary | ICD-10-CM

## 2016-07-31 HISTORY — DX: Encounter for screening for respiratory tuberculosis: Z11.1

## 2016-07-31 HISTORY — DX: Encounter for screening for cardiovascular disorders: Z13.6

## 2016-07-31 HISTORY — DX: Type 2 diabetes mellitus with other diabetic kidney complication: E11.29

## 2016-07-31 HISTORY — DX: Encounter for adequacy testing for hemodialysis: Z49.31

## 2016-07-31 HISTORY — DX: Encounter for immunization: Z23

## 2016-07-31 HISTORY — DX: Iron deficiency anemia, unspecified: D50.9

## 2016-07-31 HISTORY — DX: Embolism due to vascular prosthetic devices, implants and grafts, initial encounter: T82.818A

## 2016-07-31 HISTORY — DX: Secondary hyperparathyroidism of renal origin: N25.81

## 2016-07-31 HISTORY — DX: Essential (primary) hypertension: I10

## 2016-08-02 ENCOUNTER — Telehealth: Payer: Self-pay | Admitting: Cardiology

## 2016-08-02 NOTE — Telephone Encounter (Signed)
Spoke with priscilla, they are okay with the patient being seen next week. Left message for pt to call to schedule appointment.

## 2016-08-02 NOTE — Telephone Encounter (Signed)
Follow up scheduled

## 2016-08-02 NOTE — Telephone Encounter (Signed)
New message    Patient eval - kidney transplant .  nuclear stress is positive done 7/3 office fax # given to fax over results.   patient will need a cardiac cath.    Please call the office back when you receive the nuclear stress report.

## 2016-08-02 NOTE — Telephone Encounter (Signed)
Schedule ov with me or PA to arrange cath Kirk Ruths

## 2016-08-16 ENCOUNTER — Encounter: Payer: Self-pay | Admitting: *Deleted

## 2016-08-16 ENCOUNTER — Ambulatory Visit: Payer: BLUE CROSS/BLUE SHIELD | Admitting: Physician Assistant

## 2016-08-16 NOTE — Progress Notes (Deleted)
Cardiology Office Note    Date:  08/16/2016   ID:  Kyle, [name] [birth year], MRN [id number]  PCP:  [name], MD  Cardiologist:  Dr. Stanford Breed   No chief complaint on file.   History of Present Illness:  Kyle [name] is a 61 y.o. male with PMH of HTN, DM, stage V CKD, chronic systolic HF with baseline EF 15%. He was initially admitted to [facility] in December 2015 when he presented with acute on chronic renal failure and acute heart failure. Echocardiogram obtained this time showed EF 50% with grade to the ED, mild MR/AR, moderate TR, small pericardial effusion. Prior to that, he did not take any medication follow-up to a year and has no medical follow-up for 2 years. Per Dr. Johnsie Cancel, echocardiogram at the time showed echogenic myocardium that could represent amyloid with small pericardial effusion and the EF 15% with elevated EDP on diastolic evaluation. There was questionable mass in the pericardial space near the RV and AV groove likely represent epicardial fat. MRI could not be done due to advanced chronic kidney disease. He was hesitant to start dialysis and was lost to follow-up with both cardiology and nephrology. He was readmitted in July 2016 with acute on chronic biventricular heart failure with anasarca and pleural effusion. He was treated with paracentesis an CRRT. Decision was made to pursue hemodialysis due to ESRD. He developed bradycardic/PEA arrest during dialysis catheter placement require CPR 10 minutes followed by eventual ROSC. He was intubated and treated with [employer] protocol. He recovered and eventually extubated. Echocardiogram demonstrated a speckled myocardium in EF 10%. He was seen by Dr. Caryl Comes, ICD was not recommended at the time. He underwent left heart cath which showed single-vessel CAD and moderate pulmonary hypertension, medical therapy was recommended. Fat-pad biopsy demonstrated no evidence of amyloid. It was however noted on CT of  cervical spine that he had a 2.5 cm right thyroid nodule, thyroid ultrasound was recommended. Thyroid ultrasound in December 2016 showed bilateral thyroid nodules and biopsy was recommended. He eventually underwent thyroid biopsy at [facility] in [facility], this showed likely benign nodule. He was last seen by Dr. Stanford Breed on 04/09/2015, his blood pressure tends to become low on dialysis days, his carvedilol was decreased to 12.5 mg twice a day.   He had a right IJ dialysis catheter placed in August 2017, subsequently underwent right brachiocephalic AV fistula in September 2017 by Dr. Scot Dock. Unfortunately, his right AV fistula was slow to mature, he underwent a fistulogram on 12/04/2015 this showed a near total occlusion just distal to the cannulation site. He returned to the OR on 11/90/2017 and underwent an antrectomy and bovine patch angioplasty of right brachiocephalic AV fistula. Last echocardiogram was done at [facility] on 06/22/2016, this showed EF 55-60%, mild AI/MR. He was recently evaluated for renal transplant team and underwent Myoview at [facility] on 07/27/2016, this showed EF 60%, mild to moderate inferior wall hypokinesis, moderate to severe inferior wall defect on stress image was partial improvement at rest. He was encouraged to follow-up with his cardiology service to set up a cardiac catheterization.  Yes EKG Set up cath   Past Medical History:  Diagnosis Date  . CAD (coronary artery disease)   . Cardiac arrest ([place])    07/2014-[place] initiated  . CHF (congestive heart failure) ([place])   . Diabetes mellitus without complication (HCC)    Type 2  . ESRD (end  stage renal disease) ([place])    [place] M/W/F  . Heart murmur    as a child  . Hypertension   . Infection and inflammatory reaction due to cardiac device, implant, and graft ([place]) 10/06/2015  . Staphylococcus aureus bacteremia 08/29/2015    Past Surgical History:    Procedure Laterality Date  . AV FISTULA PLACEMENT Left 09/11/2014   Procedure: INSERTION OF LEFT ARM 6MM ARTERIOVENOUS GORTEX GRAFT ;  Surgeon: Elam Dutch, MD;  Location: [place];  Service: Vascular;  Laterality: Left;  . AV FISTULA PLACEMENT Right 10/14/2015   Procedure: ARTERIOVENOUS (AV) FISTULA CREATION;  Surgeon: Angelia Mould, MD;  Location: [place];  Service: Vascular;  Laterality: Right;  . CARDIAC CATHETERIZATION N/A 09/04/2014   Procedure: Right/Left Heart Cath and Coronary Angiography;  Surgeon: Peter M Martinique, MD;  Location: [place] [facility];  Service: Cardiovascular;  Laterality: N/A;  . EXTERNAL EAR SURGERY    . FINGER SURGERY     for infection  . INSERTION OF DIALYSIS CATHETER N/A 08/21/2014   Procedure: INSERTION OF DIALYSIS CATHETER RIGHT INTERNAL JUGULAR VEIN;  Surgeon: Conrad [place], MD;  Location: [place];  Service: Vascular;  Laterality: N/A;  MAC to General  . INSERTION OF DIALYSIS CATHETER N/A 09/08/2015   Procedure: INSERTION OF DIALYSIS CATHETER RIGHT INTERNAL JUGULAR;  Surgeon: Rosetta Posner, MD;  Location: [place];  Service: Vascular;  Laterality: N/A;  . PATCH ANGIOPLASTY Right 12/04/2015   Procedure: WITH 1 X 6 CM [place] ANGIOPLASTY TO RIGHT ARM BRACHIO-CEPHALIC FISTULA;  Surgeon: Conrad Ken Caryl, MD;  Location: [place];  Service: Vascular;  Laterality: Right;  . PERIPHERAL VASCULAR CATHETERIZATION N/A 12/04/2015   Procedure: Fistulagram - Right Arm;  Surgeon: Conrad Chester, MD;  Location: [place] [facility];  Service: Cardiovascular;  Laterality: N/A;  . REVISION OF ARTERIOVENOUS GORETEX GRAFT Left 08/31/2015   Procedure: REVISION OF LEFT ARTERIOVENOUS GORETEX GRAFT USING  AN INTERPOSITION  4-7MM GORTEX GRAFT WITH REMOVAL OF INFECTED STENT;  INTRAOPERATIVE ARTERIOGRAM TIMES ONE.;  Surgeon: Angelia Mould, MD;  Location: [place];  Service: Vascular;  Laterality: Left;  . REVISON OF ARTERIOVENOUS FISTULA Right 12/04/2015   Procedure: REVISON OF RIGHT  BRACHIO--CEPHALIC  ARTERIOVENOUS FISTULA;  Surgeon: Conrad , MD;  Location: [place];  Service: Vascular;  Laterality: Right;  . TONSILLECTOMY      Current Medications: Outpatient Medications Prior to Visit  Medication Sig Dispense Refill  . aspirin 81 MG tablet Take 81 mg by mouth daily.    Marland Kitchen atorvastatin (LIPITOR) 40 MG tablet Take 1 tablet (40 mg total) by mouth daily. Needs appointment for further refills 45 tablet 0  . B Complex-C-Folic Acid (RENA-VITE RX) 1 MG TABS Take 1 tablet by mouth daily.  6  . carvedilol (COREG) 12.5 MG tablet Take 1 tablet (12.5 mg total) by mouth 2 (two) times daily with a meal. 180 tablet 0  . doxercalciferol (HECTOROL) 4 MCG/2ML injection Inject 2.5 mLs (5 mcg total) into the vein every Monday, Wednesday, and Friday with hemodialysis.    Marland Kitchen glimepiride (AMARYL) 2 MG tablet Take 1 tablet (2 mg total) by mouth daily with breakfast. 30 tablet 1  . lisinopril (PRINIVIL,ZESTRIL) 20 MG tablet Take 20 mg by mouth daily.   6  . NON FORMULARY Phosphorus binder, pt unsure of name    . oxyCODONE-acetaminophen (PERCOCET/ROXICET) 5-325 MG tablet Take 1 tablet by mouth every 6 (six) hours as needed. (Patient not taking: Reported on  01/30/2016) 6 tablet 0  . oxyCODONE-acetaminophen (ROXICET) 5-325 MG tablet Take 1 tablet by mouth every 6 (six) hours as needed for moderate pain. (Patient not taking: Reported on 01/30/2016) 10 tablet 0   Facility-Administered Medications Prior to Visit  Medication Dose Route Frequency Provider Last Rate Last Dose  . dextrose 5 %-0.45 % sodium chloride infusion   Intravenous Continuous Conrad [place], MD         Allergies:   No known allergies   Social History   Social History  . Marital status: Married    Spouse name: N/A  . Number of children: N/A  . Years of education: N/A   Social History Main Topics  . Smoking status: Former Smoker    Types: Cigarettes    Quit date: 06/1982  . Smokeless tobacco: Never Used  . Alcohol use No  .  Drug use: No  . Sexual activity: Not on file   Other Topics Concern  . Not on file   Social History Narrative  . No narrative on file     Family History:  [name] ***family history includes Diabetes in his father and mother; Heart failure in his mother; Hypertension in his mother.   ROS:   Please see the history of present illness.    ROS All other systems reviewed and are negative.   PHYSICAL EXAM:   VS:  There were no vitals taken for this visit.   GEN: Well nourished, well developed, in no acute distress  HEENT: normal  Neck: no JVD, carotid bruits, or masses Cardiac: ***RRR; no murmurs, rubs, or gallops,no edema  Respiratory:  clear to auscultation bilaterally, normal work of breathing GI: soft, nontender, nondistended, + BS MS: no deformity or atrophy  Skin: warm and dry, no rash Neuro:  Alert and Oriented x 3, Strength and sensation are intact Psych: euthymic mood, full affect  Wt Readings from Last 3 Encounters:  01/30/16 179 lb (81.2 kg)  12/04/15 178 lb (80.7 kg)  11/27/15 177 lb 1.6 oz (80.3 kg)      Studies/Labs Reviewed:   EKG:  EKG is*** ordered today.  The ekg ordered today demonstrates ***  Recent Labs: 08/29/2015: ALT 20 09/01/2015: Platelets 156 12/04/2015: BUN 30; Creatinine, Ser 7.60; Hemoglobin 12.6; Potassium 4.2; Sodium 142   Lipid Panel    Component Value Date/Time   CHOL 158 09/07/2014 0551   TRIG 116 09/07/2014 0551   HDL 46 09/07/2014 0551   CHOLHDL 3.4 09/07/2014 0551   VLDL 23 09/07/2014 0551   LDLCALC 89 09/07/2014 0551    Additional studies/ records that were reviewed today include:   LHC 09/04/14 LM: Normal LAD: Ostial 25% LCx: Proximal 20%, OM1 20% RCA: Proximal 80% Moderate pulmonary hypertension, normal LVEDP 1. Single vessel obstructive CAD involving a nondominant RCA 2. Moderate pulmonary HTN with normal LV filling pressures.  Recommendations: medical management.     Echo 08/22/14 Speckled appearance  (consider amyloid versus ESRD), severe LVH, EF 10%, diffuse HK, grade 1 diastolic dysfunction, mild AI, mild LAE, mild RVE, severely reduced RVSF, mild RAE,? PFO (not fully visualize) large left pleural effusion  Since last seen, the patient denies any dyspnea on exertion, orthopnea, PND, pedal edema, palpitations, syncope or chest pain.   Echo 06/22/2016 SUMMARY The left ventricular size is normal. There is normal left ventricular wall thickness.  Left ventricular systolic function is normal. LV ejection fraction = 55-60%.  Left ventricular filling pattern is impaired relaxation. The right ventricle is normal in  size and function. The left atrium is mildly dilated. There is aortic valve sclerosis. There is mild aortic regurgitation. There is mild mitral regurgitation. No pulmonary hypertension. The IVC is normal in size with an inspiratory collapse of greater then 50%,  suggesting normal right atrial pressure. There is no pericardial effusion. There is no comparison study available.   Myoview 07/27/2016 FINDINGS:  Physiological distribution of the radiotracer. No unexpected findings on raw images. No transient ischemic dilatation. Moderate-to-severe inferior wall defect on stress images with partial improvement at rest.   Gated images: End diastolic volume: 354 cc End systolic volume: 53 cc Ejection fraction: 60%. Wall motion abnormalities: Mild to moderate inferior wall hypokinesia.    ASSESSMENT:    No diagnosis found.   PLAN:  In order of problems listed above:  1. ***    Medication Adjustments/Labs and Tests Ordered: Current medicines are reviewed at length with the patient today.  Concerns regarding medicines are outlined above.  Medication changes, Labs and Tests ordered today are listed in the Patient Instructions below. There are no Patient Instructions on file for this visit.   Hilbert Corrigan, [place]  08/16/2016 6:32 AM    [address], [place], [place]  [address] Phone: [phone]; Fax: [phone]

## 2016-08-25 ENCOUNTER — Telehealth: Payer: Self-pay | Admitting: Cardiology

## 2016-08-25 NOTE — Telephone Encounter (Signed)
[facility]  called to make sure [facility] got  His Stress test report that was abnormal      Please give her a call back to confirm   # [phone]  [place]  Coord.

## 2016-08-25 NOTE — Telephone Encounter (Addendum)
Left message for Kyle [name], we did receive the stress test results and the patient was scheduled for appoitment to be set up for a cath and he no showed. Will try to reach back out to the patient to get rescheduled. She is to call back with questions.

## 2016-09-09 ENCOUNTER — Other Ambulatory Visit: Payer: Self-pay

## 2016-09-09 MED ORDER — ATORVASTATIN CALCIUM 40 MG PO TABS: 40.0000 mg | ORAL_TABLET | Freq: Every day | ORAL | 0 refills | Status: DC

## 2016-09-16 ENCOUNTER — Encounter: Payer: Self-pay | Admitting: Physician Assistant

## 2016-09-16 ENCOUNTER — Ambulatory Visit (INDEPENDENT_AMBULATORY_CARE_PROVIDER_SITE_OTHER): Payer: BLUE CROSS/BLUE SHIELD | Admitting: Physician Assistant

## 2016-09-16 VITALS — BP 125/74 | HR 90 | Ht 73.0 in | Wt 178.4 lb

## 2016-09-16 DIAGNOSIS — I428 Other cardiomyopathies: Secondary | ICD-10-CM

## 2016-09-16 DIAGNOSIS — R9439 Abnormal result of other cardiovascular function study: Secondary | ICD-10-CM

## 2016-09-16 DIAGNOSIS — N186 End stage renal disease: Secondary | ICD-10-CM | POA: Diagnosis not present

## 2016-09-16 DIAGNOSIS — Z01810 Encounter for preprocedural cardiovascular examination: Secondary | ICD-10-CM | POA: Diagnosis not present

## 2016-09-16 DIAGNOSIS — E119 Type 2 diabetes mellitus without complications: Secondary | ICD-10-CM

## 2016-09-16 DIAGNOSIS — I10 Essential (primary) hypertension: Secondary | ICD-10-CM | POA: Diagnosis not present

## 2016-09-16 NOTE — Progress Notes (Signed)
Cardiology Office Note    Date:  09/18/2016   ID:  Kyle, [name] Nov 14, 1955, MRN [id number]  PCP:  [name], MD  Cardiologist:  Dr. Stanford Breed Primary nephrologist: Dr. Mercy Moore and Dr. Moshe Cipro   Chief Complaint  Patient presents with  . Follow-up    Pt states no Sx. Seen for Dr. Stanford Breed    History of Present Illness:  Kyle [name] is a 61 y.o. male with with PMH of HTN, DM, stage V CKD, h/o NICM with EF 15% which eventually normalized. He was initially admitted to [facility] in December 2015 when he presented with acute on chronic renal failure and acute heart failure. Echocardiogram obtained this time showed EF 50% with grade to the ED, mild MR/AR, moderate TR, small pericardial effusion. Prior to that, he did not take any medication follow-up to a year and has no medical follow-up for 2 years. Per Dr. Johnsie Cancel, echocardiogram at the time showed echogenic myocardium that could represent amyloid with small pericardial effusion and the EF 15% with elevated EDP on diastolic evaluation. There was questionable mass in the pericardial space near the RV and AV groove likely represent epicardial fat. MRI could not be done due to advanced chronic kidney disease. He was hesitant to start dialysis and was lost to follow-up with both cardiology and nephrology. He was readmitted in July 2016 with acute on chronic biventricular heart failure with anasarca and pleural effusion. He was treated with paracentesis an CRRT. Decision was made to pursue hemodialysis due to ESRD. He developed bradycardic/PEA arrest during dialysis catheter placement require CPR 10 minutes followed by eventual ROSC. He was intubated and treated with [employer] protocol. He recovered and eventually extubated. Echocardiogram demonstrated a speckled myocardium in EF 10%. He was seen by Dr. Caryl Comes, ICD was not recommended at the time. He underwent left heart cath which showed single-vessel CAD and moderate  pulmonary hypertension, medical therapy was recommended. Fat-pad biopsy demonstrated no evidence of amyloid. It was however noted on CT of cervical spine that he had a 2.5 cm right thyroid nodule, thyroid ultrasound was recommended. Thyroid ultrasound in December 2016 showed bilateral thyroid nodules and biopsy was recommended. He eventually underwent thyroid biopsy at [facility], this showed likely benign nodule. He was last seen by Dr. Stanford Breed on 04/09/2015, his blood pressure tends to become low on dialysis days, his carvedilol was decreased to 12.5 mg twice a day.   He had a right IJ dialysis catheter placed in August 2017, subsequently underwent right brachiocephalic AV fistula in September 2017 by Dr. Scot Dock. Unfortunately, his right AV fistula was slow to mature, he underwent a fistulogram on 12/04/2015 this showed a near total occlusion just distal to the cannulation site. He returned to the OR on 11/90/2017 and underwent an antrectomy and bovine patch angioplasty of right brachiocephalic AV fistula. Last echocardiogram was done at [facility] on 06/22/2016, this showed EF 55-60%, mild AI/MR. He was recently evaluated for renal transplant team and underwent Myoview at [facility] on 07/27/2016, this showed EF 60%, mild to moderate inferior wall hypokinesis, moderate to severe inferior wall defect on stress image was partial improvement at rest. He was encouraged to follow-up with his cardiology service to set up a cardiac catheterization.  He presents today for discussion of cath. The location of inducible ischemia is consistent with the prior significant stenosis in the RCA. Despite the recent abnormal Myoview, he does not  have any significant symptom. We did discussed benefits and risks of cardiac catheterization, he was agreeable to proceed.    Past Medical History:  Diagnosis Date  . CAD (coronary artery disease)   . Cardiac  arrest ([place])    07/2014-[place] initiated  . CHF (congestive heart failure) ([place])   . Diabetes mellitus without complication (HCC)    Type 2  . ESRD (end stage renal disease) ([place])    [place] M/W/F  . Heart murmur    as a child  . Hypertension   . Infection and inflammatory reaction due to cardiac device, implant, and graft ([place]) 10/06/2015  . Staphylococcus aureus bacteremia 08/29/2015    Past Surgical History:  Procedure Laterality Date  . AV FISTULA PLACEMENT Left 09/11/2014   Procedure: INSERTION OF LEFT ARM 6MM ARTERIOVENOUS GORTEX GRAFT ;  Surgeon: Elam Dutch, MD;  Location: [place];  Service: Vascular;  Laterality: Left;  . AV FISTULA PLACEMENT Right 10/14/2015   Procedure: ARTERIOVENOUS (AV) FISTULA CREATION;  Surgeon: Angelia Mould, MD;  Location: [place];  Service: Vascular;  Laterality: Right;  . CARDIAC CATHETERIZATION N/A 09/04/2014   Procedure: Right/Left Heart Cath and Coronary Angiography;  Surgeon: Peter M Martinique, MD;  Location: [place] CV LAB;  Service: Cardiovascular;  Laterality: N/A;  . EXTERNAL EAR SURGERY    . FINGER SURGERY     for infection  . INSERTION OF DIALYSIS CATHETER N/A 08/21/2014   Procedure: INSERTION OF DIALYSIS CATHETER RIGHT INTERNAL JUGULAR VEIN;  Surgeon: Conrad [place], MD;  Location: [place];  Service: Vascular;  Laterality: N/A;  MAC to General  . INSERTION OF DIALYSIS CATHETER N/A 09/08/2015   Procedure: INSERTION OF DIALYSIS CATHETER RIGHT INTERNAL JUGULAR;  Surgeon: Rosetta Posner, MD;  Location: [place];  Service: Vascular;  Laterality: N/A;  . PATCH ANGIOPLASTY Right 12/04/2015   Procedure: WITH 1 X 6 CM [place] ANGIOPLASTY TO RIGHT ARM BRACHIO-CEPHALIC FISTULA;  Surgeon: Conrad [place], MD;  Location: [place];  Service: Vascular;  Laterality: Right;  . PERIPHERAL VASCULAR CATHETERIZATION N/A 12/04/2015   Procedure: Fistulagram - Right Arm;  Surgeon: Conrad Joseph, MD;  Location: [place] CV [facility];  Service:  Cardiovascular;  Laterality: N/A;  . REVISION OF ARTERIOVENOUS GORETEX GRAFT Left 08/31/2015   Procedure: REVISION OF LEFT ARTERIOVENOUS GORETEX GRAFT USING  AN INTERPOSITION  4-7MM GORTEX GRAFT WITH REMOVAL OF INFECTED STENT;  INTRAOPERATIVE ARTERIOGRAM TIMES ONE.;  Surgeon: Angelia Mould, MD;  Location: [place];  Service: Vascular;  Laterality: Left;  . REVISON OF ARTERIOVENOUS FISTULA Right 12/04/2015   Procedure: REVISON OF RIGHT BRACHIO--CEPHALIC  ARTERIOVENOUS FISTULA;  Surgeon: Conrad [place], MD;  Location: [place];  Service: Vascular;  Laterality: Right;  . TONSILLECTOMY      Current Medications: Outpatient Medications Prior to Visit  Medication Sig Dispense Refill  . aspirin 81 MG tablet Take 81 mg by mouth daily.    Marland Kitchen atorvastatin (LIPITOR) 40 MG tablet Take 1 tablet (40 mg total) by mouth daily. Please ask for refills at upcoming appointment 30 tablet 0  . B Complex-C-Folic Acid (RENA-VITE RX) 1 MG TABS Take 1 tablet by mouth daily.  6  . carvedilol (COREG) 12.5 MG tablet Take 1 tablet (12.5 mg total) by mouth 2 (two) times daily with a meal. 180 tablet 0  . doxercalciferol (HECTOROL) 4 MCG/2ML injection Inject 2.5 mLs (5 mcg total) into the vein every Monday, Wednesday, and Friday with hemodialysis.    Marland Kitchen  glimepiride (AMARYL) 2 MG tablet Take 1 tablet (2 mg total) by mouth daily with breakfast. 30 tablet 1  . lisinopril (PRINIVIL,ZESTRIL) 20 MG tablet Take 20 mg by mouth daily.   6  . NON FORMULARY Phosphorus binder, pt unsure of name    . oxyCODONE-acetaminophen (PERCOCET/ROXICET) 5-325 MG tablet Take 1 tablet by mouth every 6 (six) hours as needed. 6 tablet 0  . oxyCODONE-acetaminophen (ROXICET) 5-325 MG tablet Take 1 tablet by mouth every 6 (six) hours as needed for moderate pain. 10 tablet 0   Facility-Administered Medications Prior to Visit  Medication Dose Route Frequency Provider Last Rate Last Dose  . dextrose 5 %-0.45 % sodium chloride infusion   Intravenous Continuous  Conrad [place], MD         Allergies:   No known allergies   Social History   Social History  . Marital status: Married    Spouse name: N/A  . Number of children: N/A  . Years of education: N/A   Social History Main Topics  . Smoking status: Former Smoker    Types: Cigarettes    Quit date: 06/1982  . Smokeless tobacco: Never Used  . Alcohol use No  . Drug use: No  . Sexual activity: Not Asked   Other Topics Concern  . None   Social History Narrative  . None     Family History:  [name] family history includes Diabetes in his father and mother; Heart failure in his mother; Hypertension in his mother.   ROS:   Please see the history of present illness.    ROS All other systems reviewed and are negative.   PHYSICAL EXAM:   VS:  BP 125/74   Pulse 90   Ht 6' 1"  (1.854 m)   Wt 178 lb 6.4 oz (80.9 kg)   BMI 23.54 kg/m    GEN: Well nourished, well developed, in no acute distress  HEENT: normal  Neck: no JVD, carotid bruits, or masses Cardiac: RRR; no murmurs, rubs, or gallops,no edema  Respiratory:  clear to auscultation bilaterally, normal work of breathing GI: soft, nontender, nondistended, + BS MS: no deformity or atrophy  Skin: warm and dry, no rash Neuro:  Alert and Oriented x 3, Strength and sensation are intact Psych: euthymic mood, full affect  Wt Readings from Last 3 Encounters:  09/16/16 178 lb 6.4 oz (80.9 kg)  01/30/16 179 lb (81.2 kg)  12/04/15 178 lb (80.7 kg)      Studies/Labs Reviewed:   EKG:  EKG is not ordered today.    Recent Labs: 09/16/2016: BUN 21; Creatinine, Ser 7.58; Hemoglobin 11.4; Platelets 284; Potassium 5.0; Sodium 140   Lipid Panel    Component Value Date/Time   CHOL 158 09/07/2014 0551   TRIG 116 09/07/2014 0551   HDL 46 09/07/2014 0551   CHOLHDL 3.4 09/07/2014 0551   VLDL 23 09/07/2014 0551   LDLCALC 89 09/07/2014 0551    Additional studies/ records that were reviewed today include:   LHC 09/04/14 LM:  Normal LAD: Ostial 25% LCx: Proximal 20%, OM1 20% RCA: Proximal 80% Moderate pulmonary hypertension, normal LVEDP 1. Single vessel obstructive CAD involving a nondominant RCA 2. Moderate pulmonary HTN with normal LV filling pressures.   Recommendations: medical management.        Echo 08/22/14 Speckled appearance (consider amyloid versus ESRD), severe LVH, EF 10%, diffuse HK, grade 1 diastolic dysfunction, mild AI, mild LAE, mild RVE, severely reduced RVSF, mild RAE,? PFO (not fully visualize)  large left pleural effusion   Since last seen, the patient denies any dyspnea on exertion, orthopnea, PND, pedal edema, palpitations, syncope or chest pain.   Echo 06/22/2016 SUMMARY The left ventricular size is normal. There is normal left ventricular wall thickness.  Left ventricular systolic function is normal. LV ejection fraction = 55-60%.  Left ventricular filling pattern is impaired relaxation. The right ventricle is normal in size and function. The left atrium is mildly dilated. There is aortic valve sclerosis. There is mild aortic regurgitation. There is mild mitral regurgitation. No pulmonary hypertension. The IVC is normal in size with an inspiratory collapse of greater then 50%,  suggesting normal right atrial pressure. There is no pericardial effusion. There is no comparison study available.   Myoview 07/27/2016 FINDINGS:  Physiological distribution of the radiotracer. No unexpected findings on raw images. No transient ischemic dilatation. Moderate-to-severe inferior wall defect on stress images with partial improvement at rest.   Gated images: End diastolic volume: 585 cc End systolic volume: 53 cc Ejection fraction: 60%. Wall motion abnormalities: Mild to moderate inferior wall hypokinesia.    ASSESSMENT:    1. Abnormal stress test   2. Preprocedural cardiovascular examination   3. ESRD (end stage renal disease) ([place])   4. Essential hypertension   5.  Controlled type 2 diabetes mellitus without complication, without long-term current use of insulin ([place])   6. NICM (nonischemic cardiomyopathy) ([place])      PLAN:  In order of problems listed above:  1. Abnormal stress test: Plan to proceed with cardiac catheterization, recent Myoview showed inferior wall ischemia consistent with known history of RCA stenosis.   - Risk and benefit of procedure explained to the patient who display clear understanding and agree to proceed.  Discussed with patient possible procedural risk include bleeding, vascular injury, renal injury, arrythmia, MI, stroke and loss of limb or life.  - Standard precath orders including CBC, basic metabolic panel and PT/INR  2. ESRD: Undergoing workup for renal transplant. HD MWF  3. Hypertension: Blood pressure stable.  4. DM 2: On Glimepiride  5. NICM: History of EF reaching nadir at 15%, since early 2017, ejection fraction has normalized.    Medication Adjustments/Labs and Tests Ordered: Current medicines are reviewed at length with the patient today.  Concerns regarding medicines are outlined above.  Medication changes, Labs and Tests ordered today are listed in the Patient Instructions below. Patient Instructions  Medication Instructions:   No changes  Labwork:   CBC, BMET, PT INR today for precath eval.  Testing/Procedures:  See below for catheterization procedure details.  Follow-Up:  2-3 months with Dr. Stanford Breed  If you need a refill on your cardiac medications before your next appointment, please call your pharmacy.    [place] [address] [place] [address] Dept: [phone] Loc: [phone]  Kyle [name]  09/16/2016  You are scheduled for a Cardiac Catheterization on Thursday, August 30 with Dr. Peter Martinique.  1. Please arrive at the [facility] (Main Entrance A) at [facility]:  [address] [place],  [address] at 5:30 AM (two hours before your procedure to ensure your preparation). Free valet parking service is available.   Special note: Every effort is made to have your procedure done on time. Please understand that emergencies sometimes delay scheduled procedures.  2. Diet: Do not eat or drink anything after midnight prior to your procedure except sips of water to take  medications.  3. Labs: Drawn at office.  4. Medication instructions in preparation for your procedure:   On the morning of your procedure, take your morning medicines. You may use sips of water.  5. Plan for one night stay--bring personal belongings. 6. Bring a current list of your medications and current insurance cards. 7. You MUST have a responsible person to drive you home. 8. Someone MUST be with you the first 24 hours after you arrive home or your discharge will be delayed. 9. Please wear clothes that are easy to get on and off and wear slip-on shoes.  Thank you for allowing [place] to care for you!   -- [facility] Invasive Cardiovascular services     Signed, Almyra Deforest, [place]  09/18/2016 1:22 PM    [facility] [place], [place], [place]  [address] Phone: [phone]; Fax: [phone]

## 2016-09-16 NOTE — Patient Instructions (Signed)
Medication Instructions:   No changes  Labwork:   CBC, BMET, PT INR today for precath eval.  Testing/Procedures:  See below for catheterization procedure details.  Follow-Up:  2-3 months with Dr. Stanford Breed  If you need a refill on your cardiac medications before your next appointment, please call your pharmacy.    [place] [address] [place] [address] Dept: [phone] Loc: [phone]  TAYO MAUTE  09/16/2016  You are scheduled for a Cardiac Catheterization on Thursday, August 30 with Dr. Peter Martinique.  1. Please arrive at the [facility] (Main Entrance A) at [facility]: [address] [place], [place] [address] at 5:30 AM (two hours before your procedure to ensure your preparation). Free valet parking service is available.   Special note: Every effort is made to have your procedure done on time. Please understand that emergencies sometimes delay scheduled procedures.  2. Diet: Do not eat or drink anything after midnight prior to your procedure except sips of water to take medications.  3. Labs: Drawn at office.  4. Medication instructions in preparation for your procedure:   On the morning of your procedure, take your morning medicines. You may use sips of water.  5. Plan for one night stay--bring personal belongings. 6. Bring a current list of your medications and current insurance cards. 7. You MUST have a responsible person to drive you home. 8. Someone MUST be with you the first 24 hours after you arrive home or your discharge will be delayed. 9. Please wear clothes that are easy to get on and off and wear slip-on shoes.  Thank you for allowing [place] to care for you!   -- [place] Invasive Cardiovascular services

## 2016-09-17 LAB — CBC
Hematocrit: 34.8 % — ABNORMAL LOW (ref 37.5–51.0)
Hemoglobin: 11.4 g/dL — ABNORMAL LOW (ref 13.0–17.7)
MCH: 29.8 pg (ref 26.6–33.0)
MCHC: 32.8 g/dL (ref 31.5–35.7)
MCV: 91 fL (ref 79–97)
Platelets: 284 10*3/uL (ref 150–379)
RBC: 3.83 x10E6/uL — ABNORMAL LOW (ref 4.14–5.80)
RDW: 15 % (ref 12.3–15.4)
WBC: 9.3 10*3/uL (ref 3.4–10.8)

## 2016-09-17 LAB — BASIC METABOLIC PANEL
BUN/Creatinine Ratio: 3 — ABNORMAL LOW (ref 10–24)
BUN: 21 mg/dL (ref 8–27)
CALCIUM: 9.8 mg/dL (ref 8.6–10.2)
CHLORIDE: 94 mmol/L — AB (ref 96–106)
CO2: 29 mmol/L (ref 20–29)
Creatinine, Ser: 7.58 mg/dL — ABNORMAL HIGH (ref 0.76–1.27)
GFR calc Af Amer: 8 mL/min/{1.73_m2} — ABNORMAL LOW (ref >59)
GFR, EST NON AFRICAN AMERICAN: 7 mL/min/{1.73_m2} — AB (ref >59)
Glucose: 92 mg/dL (ref 65–99)
POTASSIUM: 5 mmol/L (ref 3.5–5.2)
Sodium: 140 mmol/L (ref 134–144)

## 2016-09-17 LAB — PROTIME-INR
INR: 1.1 (ref 0.8–1.2)
Prothrombin Time: 11 s (ref 9.1–12.0)

## 2016-09-18 ENCOUNTER — Encounter: Payer: Self-pay | Admitting: Physician Assistant

## 2016-09-18 ENCOUNTER — Other Ambulatory Visit: Payer: Self-pay | Admitting: Physician Assistant

## 2016-09-20 NOTE — Progress Notes (Signed)
Precath labs stable.

## 2016-09-20 NOTE — Progress Notes (Signed)
Creatinine high as expected since patient is on dialysis

## 2016-09-21 ENCOUNTER — Telehealth: Payer: Self-pay

## 2016-09-21 NOTE — Telephone Encounter (Signed)
Left message on Pt VM (cell phone)  Patient contacted pre-catheterization at [facility] scheduled for:  09/23/2016 @ 0730 Verified arrival time and place:  [facility] @ 0530 Confirmed AM meds to be taken pre-cath with sip of water: Take ASA Hold [place] Patient must have responsible person to drive home post procedure and observe patient for 24 hours. Addl concerns:  Left this nurse name and # for any questions.

## 2016-09-23 ENCOUNTER — Encounter (HOSPITAL_COMMUNITY): Payer: Self-pay | Admitting: Cardiology

## 2016-09-23 ENCOUNTER — Ambulatory Visit (HOSPITAL_COMMUNITY)
Admission: RE | Admit: 2016-09-23 | Discharge: 2016-09-23 | Disposition: A | Discharge status: Home / Self Care | Payer: BLUE CROSS/BLUE SHIELD | Source: Ambulatory Visit | Attending: Cardiology | Admitting: Cardiology

## 2016-09-23 ENCOUNTER — Encounter (HOSPITAL_COMMUNITY)
Admission: RE | Disposition: A | Discharge status: Home / Self Care | Payer: Self-pay | Source: Ambulatory Visit | Attending: Cardiology

## 2016-09-23 DIAGNOSIS — N186 End stage renal disease: Secondary | ICD-10-CM | POA: Insufficient documentation

## 2016-09-23 DIAGNOSIS — R9439 Abnormal result of other cardiovascular function study: Secondary | ICD-10-CM | POA: Diagnosis present

## 2016-09-23 DIAGNOSIS — I2510 Atherosclerotic heart disease of native coronary artery without angina pectoris: Secondary | ICD-10-CM | POA: Diagnosis not present

## 2016-09-23 DIAGNOSIS — I272 Pulmonary hypertension, unspecified: Secondary | ICD-10-CM | POA: Diagnosis not present

## 2016-09-23 DIAGNOSIS — Z79891 Long term (current) use of opiate analgesic: Secondary | ICD-10-CM | POA: Diagnosis not present

## 2016-09-23 DIAGNOSIS — IMO0002 Reserved for concepts with insufficient information to code with codable children: Secondary | ICD-10-CM | POA: Diagnosis present

## 2016-09-23 DIAGNOSIS — E1122 Type 2 diabetes mellitus with diabetic chronic kidney disease: Secondary | ICD-10-CM | POA: Insufficient documentation

## 2016-09-23 DIAGNOSIS — Z87891 Personal history of nicotine dependence: Secondary | ICD-10-CM | POA: Diagnosis not present

## 2016-09-23 DIAGNOSIS — E1129 Type 2 diabetes mellitus with other diabetic kidney complication: Secondary | ICD-10-CM | POA: Diagnosis present

## 2016-09-23 DIAGNOSIS — I5022 Chronic systolic (congestive) heart failure: Secondary | ICD-10-CM

## 2016-09-23 DIAGNOSIS — Z79899 Other long term (current) drug therapy: Secondary | ICD-10-CM | POA: Diagnosis not present

## 2016-09-23 DIAGNOSIS — I132 Hypertensive heart and chronic kidney disease with heart failure and with stage 5 chronic kidney disease, or end stage renal disease: Secondary | ICD-10-CM | POA: Diagnosis not present

## 2016-09-23 DIAGNOSIS — Z7982 Long term (current) use of aspirin: Secondary | ICD-10-CM | POA: Insufficient documentation

## 2016-09-23 DIAGNOSIS — E1165 Type 2 diabetes mellitus with hyperglycemia: Secondary | ICD-10-CM

## 2016-09-23 DIAGNOSIS — E785 Hyperlipidemia, unspecified: Secondary | ICD-10-CM | POA: Diagnosis present

## 2016-09-23 DIAGNOSIS — Z992 Dependence on renal dialysis: Secondary | ICD-10-CM

## 2016-09-23 HISTORY — DX: Abnormal result of other cardiovascular function study: R94.39

## 2016-09-23 HISTORY — PX: LEFT HEART CATH AND CORONARY ANGIOGRAPHY: CATH118249

## 2016-09-23 HISTORY — DX: Chronic systolic (congestive) heart failure: I50.22

## 2016-09-23 LAB — GLUCOSE, CAPILLARY
GLUCOSE-CAPILLARY: 86 mg/dL (ref 65–99)
Glucose-Capillary: 72 mg/dL (ref 65–99)

## 2016-09-23 SURGERY — LEFT HEART CATH AND CORONARY ANGIOGRAPHY
Anesthesia: LOCAL

## 2016-09-23 MED ORDER — SODIUM CHLORIDE 0.9 % IV SOLN
INTRAVENOUS | Status: DC
  Administered 2016-09-23: 07:00:00 via INTRAVENOUS

## 2016-09-23 MED ORDER — HYDRALAZINE HCL 20 MG/ML IJ SOLN
INTRAMUSCULAR | Status: DC | PRN
  Administered 2016-09-23: 10 mg via INTRAVENOUS

## 2016-09-23 MED ORDER — SODIUM CHLORIDE 0.9% FLUSH: 3.0000 mL | Freq: Two times a day (BID) | INTRAVENOUS | Status: DC

## 2016-09-23 MED ORDER — MIDAZOLAM HCL 2 MG/2ML IJ SOLN
INTRAMUSCULAR | Status: AC
  Filled 2016-09-23: qty 2

## 2016-09-23 MED ORDER — HEPARIN (PORCINE) IN NACL 2-0.9 UNIT/ML-% IJ SOLN
INTRAMUSCULAR | Status: AC
  Filled 2016-09-23: qty 1000

## 2016-09-23 MED ORDER — IOPAMIDOL (ISOVUE-370) INJECTION 76%
INTRAVENOUS | Status: DC | PRN
  Administered 2016-09-23: 50 mL via INTRA_ARTERIAL

## 2016-09-23 MED ORDER — ASPIRIN 81 MG PO CHEW: 81.0000 mg | CHEWABLE_TABLET | ORAL | Status: DC

## 2016-09-23 MED ORDER — MIDAZOLAM HCL 2 MG/2ML IJ SOLN
INTRAMUSCULAR | Status: DC | PRN
  Administered 2016-09-23 (×2): 1 mg via INTRAVENOUS

## 2016-09-23 MED ORDER — LIDOCAINE HCL (PF) 1 % IJ SOLN
INTRAMUSCULAR | Status: DC | PRN
  Administered 2016-09-23: 20 mL

## 2016-09-23 MED ORDER — HYDRALAZINE HCL 20 MG/ML IJ SOLN
INTRAMUSCULAR | Status: AC
  Filled 2016-09-23: qty 1

## 2016-09-23 MED ORDER — SODIUM CHLORIDE 0.9 % IV SOLN: 250.0000 mL | INTRAVENOUS | Status: DC | PRN

## 2016-09-23 MED ORDER — FENTANYL CITRATE (PF) 100 MCG/2ML IJ SOLN
INTRAMUSCULAR | Status: AC
  Filled 2016-09-23: qty 2

## 2016-09-23 MED ORDER — HEPARIN (PORCINE) IN NACL 2-0.9 UNIT/ML-% IJ SOLN
INTRAMUSCULAR | Status: AC | PRN
  Administered 2016-09-23: 1000 mL

## 2016-09-23 MED ORDER — FENTANYL CITRATE (PF) 100 MCG/2ML IJ SOLN
INTRAMUSCULAR | Status: DC | PRN
  Administered 2016-09-23 (×2): 25 ug via INTRAVENOUS

## 2016-09-23 MED ORDER — LIDOCAINE HCL (PF) 1 % IJ SOLN
INTRAMUSCULAR | Status: AC
  Filled 2016-09-23: qty 30

## 2016-09-23 MED ORDER — SODIUM CHLORIDE 0.9% FLUSH: 3.0000 mL | INTRAVENOUS | Status: DC | PRN

## 2016-09-23 MED ORDER — IOPAMIDOL (ISOVUE-370) INJECTION 76%
INTRAVENOUS | Status: AC
  Filled 2016-09-23: qty 100

## 2016-09-23 SURGICAL SUPPLY — 8 items
CATH INFINITI 5FR MULTPACK ANG (CATHETERS) ×1 IMPLANT
KIT HEART LEFT (KITS) ×2 IMPLANT
PACK CARDIAC CATHETERIZATION (CUSTOM PROCEDURE TRAY) ×2 IMPLANT
SHEATH PINNACLE 5F 10CM (SHEATH) ×1 IMPLANT
SYR MEDRAD MARK V 150ML (SYRINGE) ×2 IMPLANT
TRANSDUCER W/STOPCOCK (MISCELLANEOUS) ×2 IMPLANT
TUBING CIL FLEX 10 FLL-RA (TUBING) ×2 IMPLANT
WIRE EMERALD 3MM-J .035X150CM (WIRE) ×1 IMPLANT

## 2016-09-23 NOTE — H&P (View-Only) (Signed)
Cardiology Office Note    Date:  09/18/2016   ID:  Kyle [name] 1955/05/13, MRN [id number]  PCP:  [name], MD  Cardiologist:  Dr. Stanford Breed Primary nephrologist: Dr. Mercy Moore and Dr. Moshe Cipro   Chief Complaint  Patient presents with  . Follow-up    Pt states no Sx. Seen for Dr. Stanford Breed    History of Present Illness:  Kyle [name] is a 61 y.o. male with with PMH of HTN, DM, stage V CKD, h/o NICM with EF 15% which eventually normalized. He was initially admitted to [facility] in December 2015 when he presented with acute on chronic renal failure and acute heart failure. Echocardiogram obtained this time showed EF 50% with grade to the ED, mild MR/AR, moderate TR, small pericardial effusion. Prior to that, he did not take any medication follow-up to a year and has no medical follow-up for 2 years. Per Dr. Johnsie Cancel, echocardiogram at the time showed echogenic myocardium that could represent amyloid with small pericardial effusion and the EF 15% with elevated EDP on diastolic evaluation. There was questionable mass in the pericardial space near the RV and AV groove likely represent epicardial fat. MRI could not be done due to advanced chronic kidney disease. He was hesitant to start dialysis and was lost to follow-up with both cardiology and nephrology. He was readmitted in July 2016 with acute on chronic biventricular heart failure with anasarca and pleural effusion. He was treated with paracentesis an CRRT. Decision was made to pursue hemodialysis due to ESRD. He developed bradycardic/PEA arrest during dialysis catheter placement require CPR 10 minutes followed by eventual ROSC. He was intubated and treated with [employer] protocol. He recovered and eventually extubated. Echocardiogram demonstrated a speckled myocardium in EF 10%. He was seen by Dr. Caryl Comes, ICD was not recommended at the time. He underwent left heart cath which showed single-vessel CAD and moderate  pulmonary hypertension, medical therapy was recommended. Fat-pad biopsy demonstrated no evidence of amyloid. It was however noted on CT of cervical spine that he had a 2.5 cm right thyroid nodule, thyroid ultrasound was recommended. Thyroid ultrasound in December 2016 showed bilateral thyroid nodules and biopsy was recommended. He eventually underwent thyroid biopsy at [facility] in [facility], this showed likely benign nodule. He was last seen by Dr. Stanford Breed on 04/09/2015, his blood pressure tends to become low on dialysis days, his carvedilol was decreased to 12.5 mg twice a day.   He had a right IJ dialysis catheter placed in August 2017, subsequently underwent right brachiocephalic AV fistula in September 2017 by Dr. Scot Dock. Unfortunately, his right AV fistula was slow to mature, he underwent a fistulogram on 12/04/2015 this showed a near total occlusion just distal to the cannulation site. He returned to the OR on 11/90/2017 and underwent an antrectomy and bovine patch angioplasty of right brachiocephalic AV fistula. Last echocardiogram was done at [facility] on 06/22/2016, this showed EF 55-60%, mild AI/MR. He was recently evaluated for renal transplant team and underwent Myoview at [facility] on 07/27/2016, this showed EF 60%, mild to moderate inferior wall hypokinesis, moderate to severe inferior wall defect on stress image was partial improvement at rest. He was encouraged to follow-up with his cardiology service to set up a cardiac catheterization.  He presents today for discussion of cath. The location of inducible ischemia is consistent with the prior significant stenosis in the RCA. Despite the recent abnormal Myoview, he does not  have any significant symptom. We did discussed benefits and risks of cardiac catheterization, he was agreeable to proceed.    Past Medical History:  Diagnosis Date  . CAD (coronary artery disease)   . Cardiac  arrest ([place])    07/2014-[place] initiated  . CHF (congestive heart failure) ([place])   . Diabetes mellitus without complication (HCC)    Type 2  . ESRD (end stage renal disease) ([place])    [place] M/W/F  . Heart murmur    as a child  . Hypertension   . Infection and inflammatory reaction due to cardiac device, implant, and graft ([place]) 10/06/2015  . Staphylococcus aureus bacteremia 08/29/2015    Past Surgical History:  Procedure Laterality Date  . AV FISTULA PLACEMENT Left 09/11/2014   Procedure: INSERTION OF LEFT ARM 6MM ARTERIOVENOUS GORTEX GRAFT ;  Surgeon: Elam Dutch, MD;  Location: [place];  Service: Vascular;  Laterality: Left;  . AV FISTULA PLACEMENT Right 10/14/2015   Procedure: ARTERIOVENOUS (AV) FISTULA CREATION;  Surgeon: Angelia Mould, MD;  Location: [place];  Service: Vascular;  Laterality: Right;  . CARDIAC CATHETERIZATION N/A 09/04/2014   Procedure: Right/Left Heart Cath and Coronary Angiography;  Surgeon: Peter M Martinique, MD;  Location: [place] [facility];  Service: Cardiovascular;  Laterality: N/A;  . EXTERNAL EAR SURGERY    . FINGER SURGERY     for infection  . INSERTION OF DIALYSIS CATHETER N/A 08/21/2014   Procedure: INSERTION OF DIALYSIS CATHETER RIGHT INTERNAL JUGULAR VEIN;  Surgeon: Conrad Dundas, MD;  Location: [place];  Service: Vascular;  Laterality: N/A;  MAC to General  . INSERTION OF DIALYSIS CATHETER N/A 09/08/2015   Procedure: INSERTION OF DIALYSIS CATHETER RIGHT INTERNAL JUGULAR;  Surgeon: Rosetta Posner, MD;  Location: [place];  Service: Vascular;  Laterality: N/A;  . PATCH ANGIOPLASTY Right 12/04/2015   Procedure: WITH 1 X 6 CM [place] ANGIOPLASTY TO RIGHT ARM BRACHIO-CEPHALIC FISTULA;  Surgeon: Conrad [place], MD;  Location: [place];  Service: Vascular;  Laterality: Right;  . PERIPHERAL VASCULAR CATHETERIZATION N/A 12/04/2015   Procedure: Fistulagram - Right Arm;  Surgeon: Conrad Burnham, MD;  Location: [place] [facility];  Service:  Cardiovascular;  Laterality: N/A;  . REVISION OF ARTERIOVENOUS GORETEX GRAFT Left 08/31/2015   Procedure: REVISION OF LEFT ARTERIOVENOUS GORETEX GRAFT USING  AN INTERPOSITION  4-7MM GORTEX GRAFT WITH REMOVAL OF INFECTED STENT;  INTRAOPERATIVE ARTERIOGRAM TIMES ONE.;  Surgeon: Angelia Mould, MD;  Location: [place];  Service: Vascular;  Laterality: Left;  . REVISON OF ARTERIOVENOUS FISTULA Right 12/04/2015   Procedure: REVISON OF RIGHT BRACHIO--CEPHALIC  ARTERIOVENOUS FISTULA;  Surgeon: Conrad [place]mont, MD;  Location: [place];  Service: Vascular;  Laterality: Right;  . TONSILLECTOMY      Current Medications: Outpatient Medications Prior to Visit  Medication Sig Dispense Refill  . aspirin 81 MG tablet Take 81 mg by mouth daily.    Marland Kitchen atorvastatin (LIPITOR) 40 MG tablet Take 1 tablet (40 mg total) by mouth daily. Please ask for refills at upcoming appointment 30 tablet 0  . B Complex-C-Folic Acid (RENA-VITE RX) 1 MG TABS Take 1 tablet by mouth daily.  6  . carvedilol (COREG) 12.5 MG tablet Take 1 tablet (12.5 mg total) by mouth 2 (two) times daily with a meal. 180 tablet 0  . doxercalciferol (HECTOROL) 4 MCG/2ML injection Inject 2.5 mLs (5 mcg total) into the vein every Monday, Wednesday, and Friday with hemodialysis.    Marland Kitchen  glimepiride (AMARYL) 2 MG tablet Take 1 tablet (2 mg total) by mouth daily with breakfast. 30 tablet 1  . lisinopril (PRINIVIL,ZESTRIL) 20 MG tablet Take 20 mg by mouth daily.   6  . NON FORMULARY Phosphorus binder, pt unsure of name    . oxyCODONE-acetaminophen (PERCOCET/ROXICET) 5-325 MG tablet Take 1 tablet by mouth every 6 (six) hours as needed. 6 tablet 0  . oxyCODONE-acetaminophen (ROXICET) 5-325 MG tablet Take 1 tablet by mouth every 6 (six) hours as needed for moderate pain. 10 tablet 0   Facility-Administered Medications Prior to Visit  Medication Dose Route Frequency Provider Last Rate Last Dose  . dextrose 5 %-0.45 % sodium chloride infusion   Intravenous Continuous  Conrad Moscow, MD         Allergies:   No known allergies   Social History   Social History  . Marital status: Married    Spouse name: N/A  . Number of children: N/A  . Years of education: N/A   Social History Main Topics  . Smoking status: Former Smoker    Types: Cigarettes    Quit date: 06/1982  . Smokeless tobacco: Never Used  . Alcohol use No  . Drug use: No  . Sexual activity: Not Asked   Other Topics Concern  . None   Social History Narrative  . None     Family History:  [name] family history includes Diabetes in his father and mother; Heart failure in his mother; Hypertension in his mother.   ROS:   Please see the history of present illness.    ROS All other systems reviewed and are negative.   PHYSICAL EXAM:   VS:  BP 125/74   Pulse 90   Ht 6' 1"  (1.854 m)   Wt 178 lb 6.4 oz (80.9 kg)   BMI 23.54 kg/m    GEN: Well nourished, well developed, in no acute distress  HEENT: normal  Neck: no JVD, carotid bruits, or masses Cardiac: RRR; no murmurs, rubs, or gallops,no edema  Respiratory:  clear to auscultation bilaterally, normal work of breathing GI: soft, nontender, nondistended, + BS MS: no deformity or atrophy  Skin: warm and dry, no rash Neuro:  Alert and Oriented x 3, Strength and sensation are intact Psych: euthymic mood, full affect  Wt Readings from Last 3 Encounters:  09/16/16 178 lb 6.4 oz (80.9 kg)  01/30/16 179 lb (81.2 kg)  12/04/15 178 lb (80.7 kg)      Studies/Labs Reviewed:   EKG:  EKG is not ordered today.    Recent Labs: 09/16/2016: BUN 21; Creatinine, Ser 7.58; Hemoglobin 11.4; Platelets 284; Potassium 5.0; Sodium 140   Lipid Panel    Component Value Date/Time   CHOL 158 09/07/2014 0551   TRIG 116 09/07/2014 0551   HDL 46 09/07/2014 0551   CHOLHDL 3.4 09/07/2014 0551   VLDL 23 09/07/2014 0551   LDLCALC 89 09/07/2014 0551    Additional studies/ records that were reviewed today include:   LHC 09/04/14 LM:  Normal LAD: Ostial 25% LCx: Proximal 20%, OM1 20% RCA: Proximal 80% Moderate pulmonary hypertension, normal LVEDP 1. Single vessel obstructive CAD involving a nondominant RCA 2. Moderate pulmonary HTN with normal LV filling pressures.   Recommendations: medical management.        Echo 08/22/14 Speckled appearance (consider amyloid versus ESRD), severe LVH, EF 10%, diffuse HK, grade 1 diastolic dysfunction, mild AI, mild LAE, mild RVE, severely reduced RVSF, mild RAE,? PFO (not fully visualize)  large left pleural effusion   Since last seen, the patient denies any dyspnea on exertion, orthopnea, PND, pedal edema, palpitations, syncope or chest pain.   Echo 06/22/2016 SUMMARY The left ventricular size is normal. There is normal left ventricular wall thickness.  Left ventricular systolic function is normal. LV ejection fraction = 55-60%.  Left ventricular filling pattern is impaired relaxation. The right ventricle is normal in size and function. The left atrium is mildly dilated. There is aortic valve sclerosis. There is mild aortic regurgitation. There is mild mitral regurgitation. No pulmonary hypertension. The IVC is normal in size with an inspiratory collapse of greater then 50%,  suggesting normal right atrial pressure. There is no pericardial effusion. There is no comparison study available.   Myoview 07/27/2016 FINDINGS:  Physiological distribution of the radiotracer. No unexpected findings on raw images. No transient ischemic dilatation. Moderate-to-severe inferior wall defect on stress images with partial improvement at rest.   Gated images: End diastolic volume: 657 cc End systolic volume: 53 cc Ejection fraction: 60%. Wall motion abnormalities: Mild to moderate inferior wall hypokinesia.    ASSESSMENT:    1. Abnormal stress test   2. Preprocedural cardiovascular examination   3. ESRD (end stage renal disease) ([place])   4. Essential hypertension   5.  Controlled type 2 diabetes mellitus without complication, without long-term current use of insulin ([place])   6. NICM (nonischemic cardiomyopathy) ([place])      PLAN:  In order of problems listed above:  1. Abnormal stress test: Plan to proceed with cardiac catheterization, recent Myoview showed inferior wall ischemia consistent with known history of RCA stenosis.   - Risk and benefit of procedure explained to the patient who display clear understanding and agree to proceed.  Discussed with patient possible procedural risk include bleeding, vascular injury, renal injury, arrythmia, MI, stroke and loss of limb or life.  - Standard precath orders including CBC, basic metabolic panel and PT/INR  2. ESRD: Undergoing workup for renal transplant. HD MWF  3. Hypertension: Blood pressure stable.  4. DM 2: On Glimepiride  5. NICM: History of EF reaching nadir at 15%, since early 2017, ejection fraction has normalized.    Medication Adjustments/Labs and Tests Ordered: Current medicines are reviewed at length with the patient today.  Concerns regarding medicines are outlined above.  Medication changes, Labs and Tests ordered today are listed in the Patient Instructions below. Patient Instructions  Medication Instructions:   No changes  Labwork:   CBC, BMET, PT INR today for precath eval.  Testing/Procedures:  See below for catheterization procedure details.  Follow-Up:  2-3 months with Dr. Stanford Breed  If you need a refill on your cardiac medications before your next appointment, please call your pharmacy.    [place] [facility] [address] [place] [address] Dept: [phone] Loc: [phone]  Kyle [name]  09/16/2016  You are scheduled for a Cardiac Catheterization on Thursday, August 30 with Dr. Peter Martinique.  1. Please arrive at the [facility] (Main Entrance A) at [facility]:  [address] [place], [place] [address] at 5:30 AM (two hours before your procedure to ensure your preparation). Free valet parking service is available.   Special note: Every effort is made to have your procedure done on time. Please understand that emergencies sometimes delay scheduled procedures.  2. Diet: Do not eat or drink anything after midnight prior to your procedure except sips of water to take  medications.  3. Labs: Drawn at office.  4. Medication instructions in preparation for your procedure:   On the morning of your procedure, take your morning medicines. You may use sips of water.  5. Plan for one night stay--bring personal belongings. 6. Bring a current list of your medications and current insurance cards. 7. You MUST have a responsible person to drive you home. 8. Someone MUST be with you the first 24 hours after you arrive home or your discharge will be delayed. 9. Please wear clothes that are easy to get on and off and wear slip-on shoes.  Thank you for allowing [place] to care for you!   -- [facility] Invasive Cardiovascular services     Signed, Almyra Deforest, [place]  09/18/2016 1:22 PM    [facility] [place], [place], [place]  [address] Phone: [phone]; Fax: [phone]

## 2016-09-23 NOTE — Discharge Instructions (Signed)
Femoral Site Care Refer to this sheet in the next few weeks. These instructions provide you with information about caring for yourself after your procedure. Your health care provider may also give you more specific instructions. Your treatment has been planned according to current medical practices, but problems sometimes occur. Call your health care provider if you have any problems or questions after your procedure. What can I expect after the procedure? After your procedure, it is typical to have the following:  Bruising at the site that usually fades within 1-2 weeks.  Blood collecting in the tissue (hematoma) that may be painful to the touch. It should usually decrease in size and tenderness within 1-2 weeks.  Follow these instructions at home:  Take medicines only as directed by your health care provider.  You may shower 24-48 hours after the procedure or as directed by your health care provider. Remove the bandage (dressing) and gently wash the site with plain soap and water. Pat the area dry with a clean towel. Do not rub the site, because this may cause bleeding.  Do not take baths, swim, or use a hot tub until your health care provider approves.  Check your insertion site every day for redness, swelling, or drainage.  Do not apply powder or lotion to the site.  Limit use of stairs to twice a day for the first 2-3 days or as directed by your health care provider.  Do not squat for the first 2-3 days or as directed by your health care provider.  Do not lift over 10 lb (4.5 kg) for 5 days after your procedure or as directed by your health care provider.  Ask your health care provider when it is okay to: ? Return to work or school. ? Resume usual physical activities or sports. ? Resume sexual activity.  Do not drive home if you are discharged the same day as the procedure. Have someone else drive you.  You may drive 24 hours after the procedure unless otherwise instructed by  your health care provider.  Do not operate machinery or power tools for 24 hours after the procedure or as directed by your health care provider.  If your procedure was done as an outpatient procedure, which means that you went home the same day as your procedure, a responsible adult should be with you for the first 24 hours after you arrive home.  Keep all follow-up visits as directed by your health care provider. This is important. Contact a health care provider if:  You have a fever.  You have chills.  You have increased bleeding from the site. Hold pressure on the site. call Get help right away if:  You have unusual pain at the site.  You have redness, warmth, or swelling at the site.  You have drainage (other than a small amount of blood on the dressing) from the site.  The site is bleeding, and the bleeding does not stop after 30 minutes of holding steady pressure on the site.  Your leg or foot becomes pale, cool, tingly, or numb. This information is not intended to replace advice given to you by your health care provider. Make sure you discuss any questions you have with your health care provider. Document Released: 09/14/2013 Document Revised: 06/19/2015 Document Reviewed: 07/31/2013 Elsevier Interactive Patient Education  [employer].

## 2016-09-23 NOTE — Progress Notes (Addendum)
Site area: RFA Site Prior to Removal:  Level 0 Pressure Applied For: 25 min Manual:   yes Patient Status During Pull:  stable Post Pull Site:  Level 0 Post Pull Instructions Given:   Post Pull Pulses Present: palpable Dressing Applied:  tegaderm Bedrest begins @ 1292 till 1250 Comments:

## 2016-09-23 NOTE — Interval H&P Note (Signed)
History and Physical Interval Note:  09/23/2016 7:29 AM  Kyle [name]  has presented today for surgery, with the diagnosis of abn stress   cad  The various methods of treatment have been discussed with the patient and family. After consideration of risks, benefits and other options for treatment, the patient has consented to  Procedure(s): LEFT HEART CATH AND CORONARY ANGIOGRAPHY (N/A) as a surgical intervention .  [name] history has been reviewed, patient examined, no change in status, stable for surgery.  I have reviewed the patient's chart and labs.  Questions were answered to the patient's satisfaction.   Cath Lab Visit (complete for each Cath Lab visit)  Clinical Evaluation Leading to the Procedure:   ACS: No.  Non-ACS:    Anginal Classification: CCS I  Anti-ischemic medical therapy: Minimal Therapy (1 class of medications)  Non-Invasive Test Results: Intermediate-risk stress test findings: cardiac mortality 1-3%/year  Prior CABG: No previous CABG        Kyle [name] [facility] 09/23/2016 7:31 AM

## 2016-09-23 NOTE — Progress Notes (Signed)
Bleeding noted right groin and pressure held for 30 min and no further bleeding noted at present; quarter size firm area noted right femoral area; no c/o groin pain no bruit heard.

## 2016-10-17 ENCOUNTER — Other Ambulatory Visit: Payer: Self-pay | Admitting: Cardiology

## 2016-12-10 ENCOUNTER — Encounter: Payer: Self-pay | Admitting: Cardiology

## 2016-12-13 NOTE — Progress Notes (Deleted)
HPI: FU cardiomyopathy, HTN, DM, stage V CKD and cardiomyopathy improved. He was originally admitted to [facility] in December 2015, when he presented with acute on chronic renal failure and acute heart failure. Echocardiogram was obtained which showed EF was 15% with grade 2 diastolic dysfunction, mild MR/AR, moderate TR, small pericardial effusion. Prior to that, patient did not have medical follow-up for 2 years and was not taking any of his medication for up to a year. Both cardiology and nephrology team was consulted during that admission. Per Dr. Johnsie Cancel, echo showed echogenic myocardiogram that could represent amyloid with small pericardial effusion and EF 15% with elevated EDP on diastolic evaluation. There was a questionable mass in the pericardial space near the RV and AV groove likely represent epicardial fat. MRI could not be done due to advanced CKD. He was hesitant to start dialysis and was lost to FU with Cardiology and Nephrology.   He was then admitted in 7/16 with a/c biventricular HF with anasarca and pleural effusion. He was treated with paracentesis and CRRT. Decision was made to pursue hemodialysis due to ESRD. Patient developed bradycardic/PEA arrest during dialysis catheter placement requiring CPR x 10 minutes and eventual ROSC. He was intubated and treated with [employer] protocol. He recovered and was eventually extubated. He was started on hemodialysis. Echocardiogram demonstrated speckled myocardium and EF 10%. He was seen by Dr. Virl Axe for EP. ICD was not recommended at that time. However, cardiac cath was recommended. LHC demonstrated single vessel CAD and mod pulmonary HTN. Med Rx was recommended. Fat pad bx demonstrated no evidence of Amyloid.   Studies/Reports Reviewed Today:  LHC 09/23/16-performed following abnormal nuclear study as part of renal transplant preop eval. 75% proximal nondominant RCA with normal LV function. Medical  therapy recommended.   Echo 08/22/14 Speckled appearance (consider amyloid versus ESRD), severe LVH, EF 10%, diffuse HK, grade 1 diastolic dysfunction, mild AI, mild LAE, mild RVE, severely reduced RVSF, mild RAE,? PFO (not fully visualize) large left pleural effusion  02/03/35-TKWIOX LV systolic function, grade 1 DD, mild left atrial enlargement.  Since last seen,   Current Outpatient Medications  Medication Sig Dispense Refill  . aspirin 81 MG tablet Take 81 mg by mouth daily.    Marland Kitchen atorvastatin (LIPITOR) 40 MG tablet TAKE 1 TABLET BY MOUTH EVERY DAY. 30 tablet 5  . B Complex-C-Folic Acid (RENA-VITE RX) 1 MG TABS Take 1 tablet by mouth daily.  6  . carvedilol (COREG) 12.5 MG tablet Take 1 tablet (12.5 mg total) by mouth 2 (two) times daily with a meal. 180 tablet 0  . doxercalciferol (HECTOROL) 4 MCG/2ML injection Inject 2.5 mLs (5 mcg total) into the vein every Monday, Wednesday, and Friday with hemodialysis.    Marland Kitchen glimepiride (AMARYL) 2 MG tablet Take 1 tablet (2 mg total) by mouth daily with breakfast. (Patient taking differently: Take 2 mg by mouth daily after lunch. ) 30 tablet 1  . lisinopril (PRINIVIL,ZESTRIL) 20 MG tablet Take 20 mg by mouth daily.   6   Current Facility-Administered Medications  Medication Dose Route Frequency Provider Last Rate Last Dose  . dextrose 5 %-0.45 % sodium chloride infusion   Intravenous Continuous Conrad [place], MD         Past Medical History:  Diagnosis Date  . CAD (coronary artery disease)   . Cardiac arrest ([place])    07/2014-Ar[place] Sun initiated  . CHF (congestive heart failure) ([place])   . Diabetes mellitus without  complication (HCC)    Type 2  . ESRD (end stage renal disease) ([place])    [place] M/W/F  . Heart murmur    as a child  . Hypertension   . Infection and inflammatory reaction due to cardiac device, implant, and graft ([place]) 10/06/2015  . Staphylococcus aureus bacteremia 08/29/2015    Past Surgical History:  Procedure  Laterality Date  . ARTERIOVENOUS (AV) FISTULA CREATION Right 10/14/2015   Performed by Angelia Mould, MD at [facility]    . FINGER SURGERY     for infection  . Fistulagram - Right Arm N/A 12/04/2015   Performed by Conrad Erin, MD at [facility]  . INSERTION OF DIALYSIS CATHETER RIGHT INTERNAL JUGULAR N/A 09/08/2015   Performed by Rosetta Posner, MD at [place]  . INSERTION OF DIALYSIS CATHETER RIGHT INTERNAL JUGULAR VEIN N/A 08/21/2014   Performed by Conrad [place], MD at [place]  . INSERTION OF LEFT ARM 6MM ARTERIOVENOUS GORTEX GRAFT Left 09/11/2014   Performed by Elam Dutch, MD at [place]  . LEFT HEART CATH AND CORONARY ANGIOGRAPHY N/A 09/23/2016   Performed by Martinique, Peter M, MD at [facility]  . REVISION OF LEFT ARTERIOVENOUS GORETEX GRAFT USING  AN INTERPOSITION  4-7MM GORTEX GRAFT WITH REMOVAL OF INFECTED STENT;  INTRAOPERATIVE ARTERIOGRAM TIMES ONE. Left 08/31/2015   Performed by Angelia Mould, MD at [facility] OF RIGHT BRACHIO--CEPHALIC  ARTERIOVENOUS FISTULA Right 12/04/2015   Performed by Conrad [place], MD at [facility]  . Right/Left Heart Cath and Coronary Angiography N/A 09/04/2014   Performed by Martinique, Peter M, MD at [facility]  . TONSILLECTOMY    . WITH 1 X 6 CM XENOSURE BIOLOGIC PATCH ANGIOPLASTY TO RIGHT ARM BRACHIO-CEPHALIC FISTULA Right 74/01/6382   Performed by Conrad [place], MD at [facility] History   Socioeconomic History  . Marital status: Married    Spouse name: Not on file  . Number of children: Not on file  . Years of education: Not on file  . Highest education level: Not on file  Social Needs  . Financial resource strain: Not on file  . Food insecurity - worry: Not on file  . Food insecurity - inability: Not on file  . Transportation needs - medical: Not on file  . Transportation needs - non-medical: Not on file  Occupational History  . Not on file  Tobacco Use  . Smoking status: Former  Smoker    Types: Cigarettes    Last attempt to quit: 06/1982    Years since quitting: 34.4  . Smokeless tobacco: Never Used  Substance and Sexual Activity  . Alcohol use: No  . Drug use: No  . Sexual activity: Not on file  Other Topics Concern  . Not on file  Social History Narrative  . Not on file    Family History  Problem Relation Age of Onset  . Heart failure Mother   . Diabetes Mother   . Hypertension Mother   . Diabetes Father     ROS: no fevers or chills, productive cough, hemoptysis, dysphasia, odynophagia, melena, hematochezia, dysuria, hematuria, rash, seizure activity, orthopnea, PND, pedal edema, claudication. Remaining systems are negative.  Physical Exam: Well-developed well-nourished in no acute distress.  Skin is warm and dry.  HEENT is normal.  Neck is supple.  Chest is clear to auscultation with  normal expansion.  Cardiovascular exam is regular rate and rhythm.  Abdominal exam nontender or distended. No masses palpated. Extremities show no edema. neuro grossly intact  ECG- personally reviewed  A/P  1  Kirk Ruths, MD

## 2016-12-17 IMAGING — CR DG CHEST 2V
2 series · 2 of 2 positions shown · non-contrast
Comparison: 01/14/2014 and 06/20/2008 chest radiographs

CLINICAL DATA: Lower extremity edema, CHF and anasarca. History of
renal failure.

EXAM:
CHEST  2 VIEW

[w chest pa]
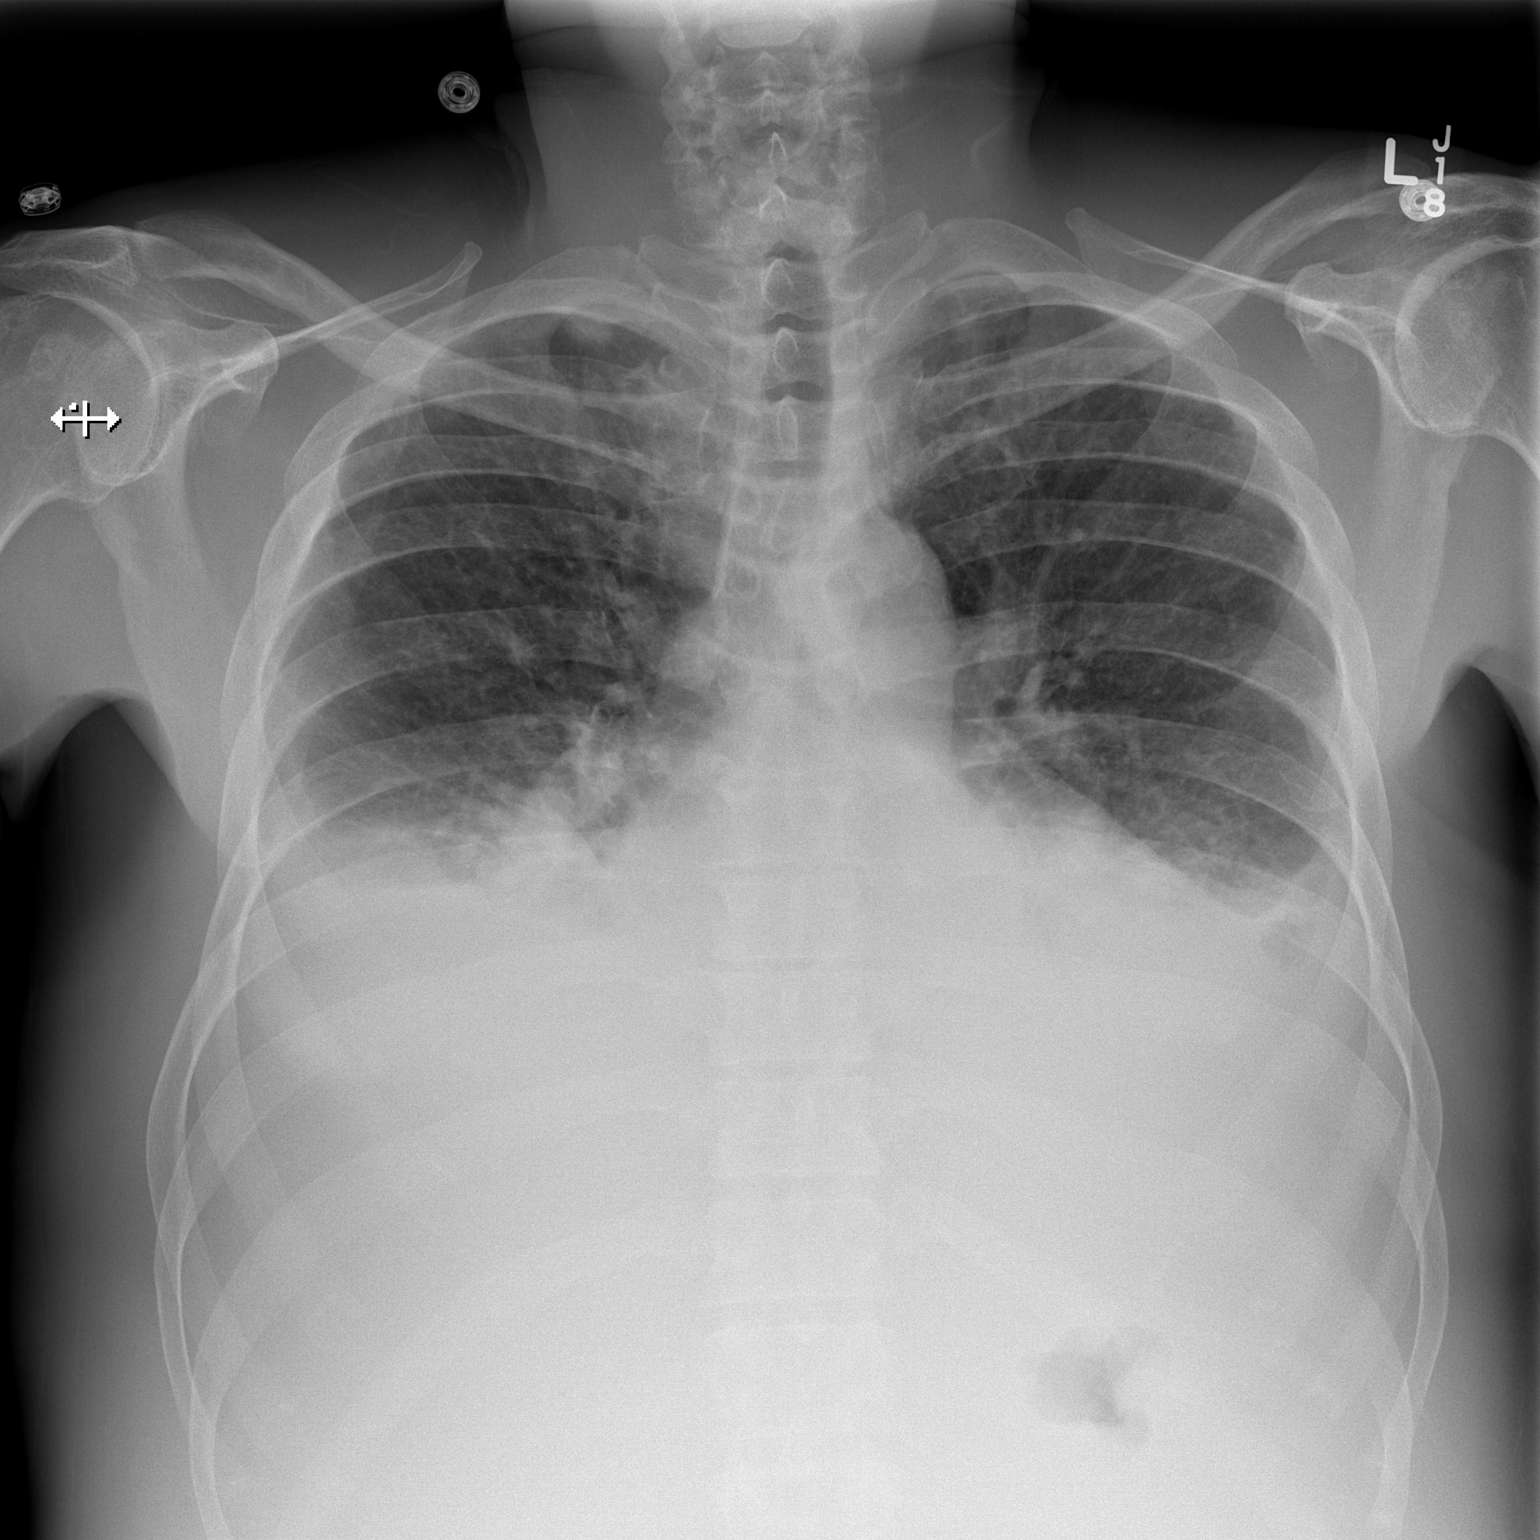

[w chest lat]
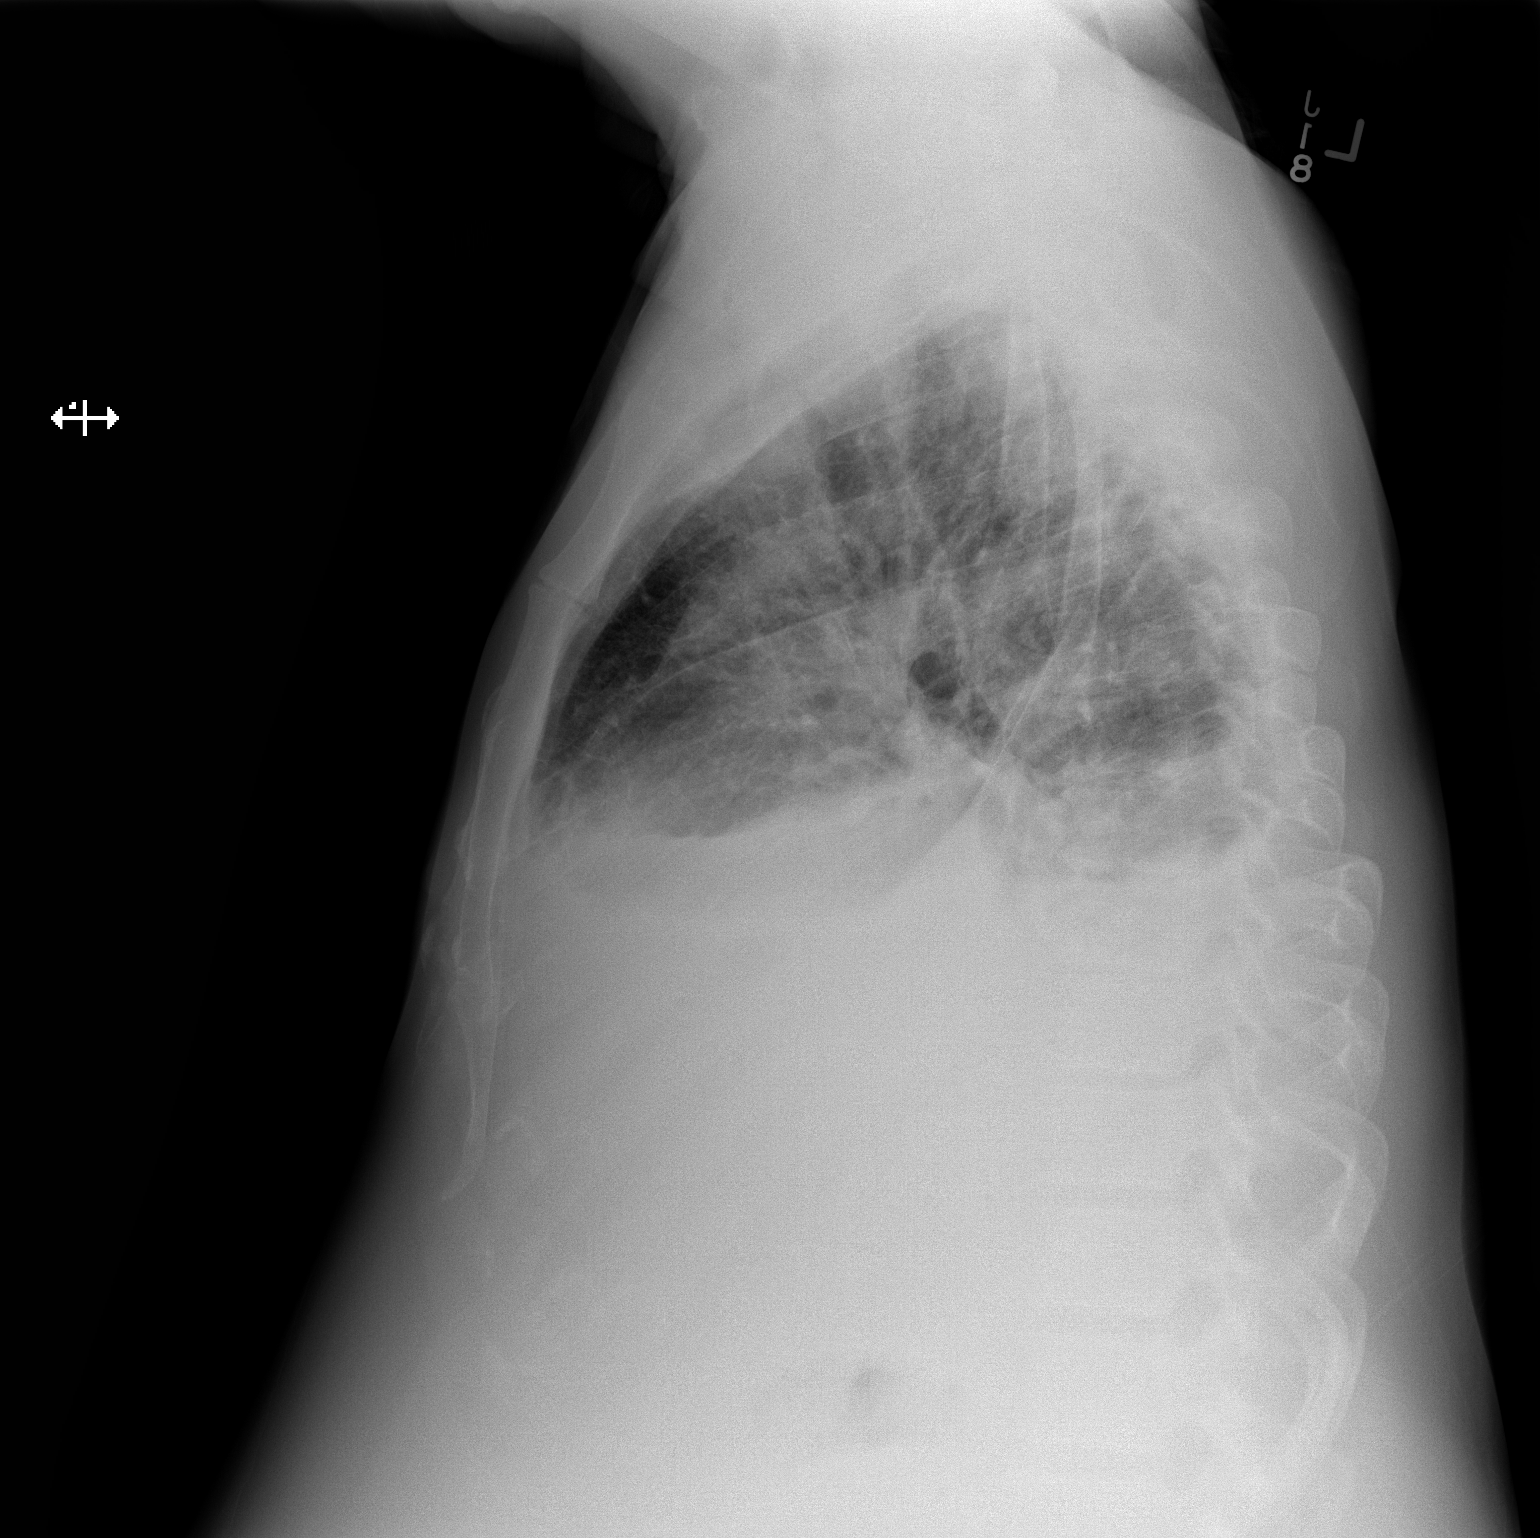

[2 of 2 positions shown; findings below may reference images not displayed]

FINDINGS: Small-moderate bilateral pleural effusions and bibasilar atelectasis
again noted.

Mild pulmonary vascular congestion is present.

The visualized cardiomediastinal silhouette is unchanged.

There is no evidence of pneumothorax.

No acute bony abnormalities are identified.
IMPRESSION: Small-moderate bilateral pleural effusions with bibasilar
atelectasis.

Mild pulmonary vascular congestion.

## 2016-12-22 ENCOUNTER — Ambulatory Visit: Payer: BLUE CROSS/BLUE SHIELD | Admitting: Cardiology

## 2016-12-24 ENCOUNTER — Other Ambulatory Visit: Payer: Self-pay

## 2016-12-24 MED ORDER — LISINOPRIL 20 MG PO TABS: 20.0000 mg | ORAL_TABLET | Freq: Every day | ORAL | 1 refills | Status: DC

## 2017-04-27 ENCOUNTER — Telehealth: Payer: Self-pay | Admitting: Cardiology

## 2017-04-27 NOTE — Telephone Encounter (Signed)
New Message:    Please call,concerning pt Kyle [name].

## 2017-04-29 NOTE — Telephone Encounter (Signed)
Spoke with Kyle [name], they need a copy of the cath from last year. Report faxed to [phone].

## 2017-07-22 ENCOUNTER — Other Ambulatory Visit: Payer: Self-pay | Admitting: Cardiology

## 2017-07-25 ENCOUNTER — Other Ambulatory Visit: Payer: Self-pay | Admitting: Cardiology

## 2017-09-27 ENCOUNTER — Other Ambulatory Visit: Payer: Self-pay | Admitting: Cardiology

## 2017-09-28 NOTE — Telephone Encounter (Signed)
Rx request sent to pharmacy.  

## 2017-10-26 NOTE — Progress Notes (Deleted)
HPI: FU cardiomyopathy, HTN, DM, stage V CKD and chronic systolic HF. He was originally admitted to [facility] in December 2015, when he presented with acute on chronic renal failure and acute heart failure. Echocardiogram was obtained which showed EF was 15% with grade 2 diastolic dysfunction, mild MR/AR, moderate TR, small pericardial effusion. Prior to that, patient did not have medical follow-up for 2 years and was not taking any of his medication for up to a year. Both cardiology and nephrology team was consulted during that admission. Per Dr. Johnsie Cancel, echo showed echogenic myocardiogram that could represent amyloid with small pericardial effusion and EF 15% with elevated EDP on diastolic evaluation. There was a questionable mass in the pericardial space near the RV and AV groove likely represent epicardial fat. MRI could not be done due to advanced CKD. He was hesitant to start dialysis and was lost to FU with Cardiology and Nephrology.   He was then admitted in 7/16 with a/c biventricular HF with anasarca and pleural effusion. He was treated with paracentesis and CRRT. Decision was made to pursue hemodialysis due to ESRD. Patient developed bradycardic/PEA arrest during dialysis catheter placement requiring CPR x 10 minutes and eventual ROSC. He was intubated and treated with [employer] protocol. He recovered and was eventually extubated. He was started on hemodialysis. Echocardiogram demonstrated speckled myocardium and EF 10%. He was seen by Dr. Virl Axe for EP. ICD was not recommended at that time. However, cardiac cath was recommended. LHC demonstrated single vessel CAD and mod pulmonary HTN. Med Rx was recommended. Fat pad bx demonstrated no evidence of Amyloid. Follow-up echocardiogram  March 2017 showed normal LV systolic function, grade 1 diastolic dysfunction and mild biatrial enlargement.    Studies/Reports Reviewed Today:  LHC 09/23/16  Ost LAD to Mid LAD  lesion, 25 %stenosed.  1st Mrg lesion, 20 %stenosed.  Prox Cx to Mid Cx lesion, 20 %stenosed.  Prox LAD lesion, 35 %stenosed.  Prox RCA lesion, 70 %stenosed.  The left ventricular systolic function is normal.  LV end diastolic pressure is normal.  The left ventricular ejection fraction is 55-65% by visual estimate.   1. Single vessel CAD. There is a 70% stenosis in a small nondominant RCA.  2. Normal LV function.  3. Normal LVEDP  Echo 06/22/16 Normal LV function, mild aortic and mitral regurgitation.  Since last seen,   Current Outpatient Medications  Medication Sig Dispense Refill  . aspirin 81 MG tablet Take 81 mg by mouth daily.    Marland Kitchen atorvastatin (LIPITOR) 40 MG tablet TAKE 1 TABLET BY MOUTH EVERY DAY. 30 tablet 5  . B Complex-C-Folic Acid (RENA-VITE RX) 1 MG TABS Take 1 tablet by mouth daily.  6  . carvedilol (COREG) 12.5 MG tablet Take 1 tablet (12.5 mg total) by mouth 2 (two) times daily with a meal. 180 tablet 0  . doxercalciferol (HECTOROL) 4 MCG/2ML injection Inject 2.5 mLs (5 mcg total) into the vein every Monday, Wednesday, and Friday with hemodialysis.    Marland Kitchen glimepiride (AMARYL) 2 MG tablet Take 1 tablet (2 mg total) by mouth daily with breakfast. (Patient taking differently: Take 2 mg by mouth daily after lunch. ) 30 tablet 1  . lisinopril (PRINIVIL,ZESTRIL) 20 MG tablet TAKE 1 TABLET BY MOUTH DAILY 30 tablet 0   Current Facility-Administered Medications  Medication Dose Route Frequency Provider Last Rate Last Dose  . dextrose 5 %-0.45 % sodium chloride infusion   Intravenous Continuous Conrad [place], MD  Past Medical History:  Diagnosis Date  . CAD (coronary artery disease)   . Cardiac arrest ([place])    07/2014-[place] initiated  . CHF (congestive heart failure) ([place])   . Diabetes mellitus without complication (HCC)    Type 2  . ESRD (end stage renal disease) ([place])    [place] M/W/F  . Heart murmur    as a child  . Hypertension   .  Infection and inflammatory reaction due to cardiac device, implant, and graft ([place]) 10/06/2015  . Staphylococcus aureus bacteremia 08/29/2015    Past Surgical History:  Procedure Laterality Date  . AV FISTULA PLACEMENT Left 09/11/2014   Procedure: INSERTION OF LEFT ARM 6MM ARTERIOVENOUS GORTEX GRAFT ;  Surgeon: Elam Dutch, MD;  Location: [facility];  Service: Vascular;  Laterality: Left;  . AV FISTULA PLACEMENT Right 10/14/2015   Procedure: ARTERIOVENOUS (AV) FISTULA CREATION;  Surgeon: Angelia Mould, MD;  Location: [facility];  Service: Vascular;  Laterality: Right;  . CARDIAC CATHETERIZATION N/A 09/04/2014   Procedure: Right/Left Heart Cath and Coronary Angiography;  Surgeon: Peter M Martinique, MD;  Location: [facility];  Service: Cardiovascular;  Laterality: N/A;  . EXTERNAL EAR SURGERY    . FINGER SURGERY     for infection  . INSERTION OF DIALYSIS CATHETER N/A 08/21/2014   Procedure: INSERTION OF DIALYSIS CATHETER RIGHT INTERNAL JUGULAR VEIN;  Surgeon: Conrad [place], MD;  Location: [place];  Service: Vascular;  Laterality: N/A;  MAC to General  . INSERTION OF DIALYSIS CATHETER N/A 09/08/2015   Procedure: INSERTION OF DIALYSIS CATHETER RIGHT INTERNAL JUGULAR;  Surgeon: Rosetta Posner, MD;  Location: [facility] OR;  Service: Vascular;  Laterality: N/A;  . LEFT HEART CATH AND CORONARY ANGIOGRAPHY N/A 09/23/2016   Procedure: LEFT HEART CATH AND CORONARY ANGIOGRAPHY;  Surgeon: Martinique, Peter M, MD;  Location: [facility];  Service: Cardiovascular;  Laterality: N/A;  . PATCH ANGIOPLASTY Right 12/04/2015   Procedure: WITH 1 X 6 CM [place] ANGIOPLASTY TO RIGHT ARM BRACHIO-CEPHALIC FISTULA;  Surgeon: Conrad Dana, MD;  Location: [facility];  Service: Vascular;  Laterality: Right;  . PERIPHERAL VASCULAR CATHETERIZATION N/A 12/04/2015   Procedure: Fistulagram - Right Arm;  Surgeon: Conrad [place], MD;  Location: [facility];  Service: Cardiovascular;  Laterality: N/A;  . REVISION OF  ARTERIOVENOUS GORETEX GRAFT Left 08/31/2015   Procedure: REVISION OF LEFT ARTERIOVENOUS GORETEX GRAFT USING  AN INTERPOSITION  4-7MM GORTEX GRAFT WITH REMOVAL OF INFECTED STENT;  INTRAOPERATIVE ARTERIOGRAM TIMES ONE.;  Surgeon: Angelia Mould, MD;  Location: [facility];  Service: Vascular;  Laterality: Left;  . REVISON OF ARTERIOVENOUS FISTULA Right 12/04/2015   Procedure: REVISON OF RIGHT BRACHIO--CEPHALIC  ARTERIOVENOUS FISTULA;  Surgeon: Conrad [place], MD;  Location: [facility];  Service: Vascular;  Laterality: Right;  . TONSILLECTOMY      Social History   Socioeconomic History  . Marital status: Married    Spouse name: Not on file  . Number of children: Not on file  . Years of education: Not on file  . Highest education level: Not on file  Occupational History  . Not on file  Social Needs  . Financial resource strain: Not on file  . Food insecurity:    Worry: Not on file    Inability: Not on file  . Transportation needs:    Medical: Not on file    Non-medical: Not on file  Tobacco Use  . Smoking status: Former  Smoker    Types: Cigarettes    Last attempt to quit: 06/1982    Years since quitting: 35.3  . Smokeless tobacco: Never Used  Substance and Sexual Activity  . Alcohol use: No  . Drug use: No  . Sexual activity: Not on file  Lifestyle  . Physical activity:    Days per week: Not on file    Minutes per session: Not on file  . Stress: Not on file  Relationships  . Social connections:    Talks on phone: Not on file    Gets together: Not on file    Attends religious service: Not on file    Active member of club or organization: Not on file    Attends meetings of clubs or organizations: Not on file    Relationship status: Not on file  . Intimate partner violence:    Fear of current or ex partner: Not on file    Emotionally abused: Not on file    Physically abused: Not on file    Forced sexual activity: Not on file  Other Topics Concern  . Not on file  Social History  Narrative  . Not on file    Family History  Problem Relation Age of Onset  . Heart failure Mother   . Diabetes Mother   . Hypertension Mother   . Diabetes Father     ROS: no fevers or chills, productive cough, hemoptysis, dysphasia, odynophagia, melena, hematochezia, dysuria, hematuria, rash, seizure activity, orthopnea, PND, pedal edema, claudication. Remaining systems are negative.  Physical Exam: Well-developed well-nourished in no acute distress.  Skin is warm and dry.  HEENT is normal.  Neck is supple.  Chest is clear to auscultation with normal expansion.  Cardiovascular exam is regular rate and rhythm.  Abdominal exam nontender or distended. No masses palpated. Extremities show no edema. neuro grossly intact  ECG- personally reviewed  A/P  1  Kyle Ruths, MD

## 2017-11-02 ENCOUNTER — Ambulatory Visit: Payer: BLUE CROSS/BLUE SHIELD | Admitting: Cardiology

## 2017-11-02 DIAGNOSIS — R0989 Other specified symptoms and signs involving the circulatory and respiratory systems: Secondary | ICD-10-CM

## 2017-11-08 DIAGNOSIS — Z4802 Encounter for removal of sutures: Secondary | ICD-10-CM

## 2017-11-08 HISTORY — DX: Encounter for removal of sutures: Z48.02

## 2018-06-19 DIAGNOSIS — E46 Unspecified protein-calorie malnutrition: Secondary | ICD-10-CM

## 2018-06-19 HISTORY — DX: Unspecified protein-calorie malnutrition: E46

## 2018-07-04 ENCOUNTER — Other Ambulatory Visit: Payer: Self-pay

## 2018-07-04 ENCOUNTER — Telehealth (HOSPITAL_COMMUNITY): Payer: Self-pay | Admitting: Rehabilitation

## 2018-07-04 DIAGNOSIS — N186 End stage renal disease: Secondary | ICD-10-CM

## 2018-07-04 DIAGNOSIS — Z992 Dependence on renal dialysis: Secondary | ICD-10-CM

## 2018-07-04 NOTE — Telephone Encounter (Signed)

## 2018-07-05 ENCOUNTER — Ambulatory Visit (HOSPITAL_COMMUNITY)
Admission: RE | Admit: 2018-07-05 | Discharge: 2018-07-05 | Disposition: A | Discharge status: Home / Self Care | Payer: Medicare Other | Source: Ambulatory Visit | Attending: Vascular Surgery | Admitting: Vascular Surgery

## 2018-07-05 ENCOUNTER — Ambulatory Visit (INDEPENDENT_AMBULATORY_CARE_PROVIDER_SITE_OTHER): Payer: Medicare Other | Admitting: Vascular Surgery

## 2018-07-05 ENCOUNTER — Encounter: Payer: Self-pay | Admitting: *Deleted

## 2018-07-05 ENCOUNTER — Other Ambulatory Visit: Payer: Self-pay

## 2018-07-05 ENCOUNTER — Ambulatory Visit (INDEPENDENT_AMBULATORY_CARE_PROVIDER_SITE_OTHER)
Admission: RE | Admit: 2018-07-05 | Discharge: 2018-07-05 | Disposition: A | Discharge status: Home / Self Care | Payer: Medicare Other | Source: Ambulatory Visit | Attending: Vascular Surgery | Admitting: Vascular Surgery

## 2018-07-05 ENCOUNTER — Other Ambulatory Visit: Payer: Self-pay | Admitting: *Deleted

## 2018-07-05 ENCOUNTER — Encounter: Payer: Self-pay | Admitting: Vascular Surgery

## 2018-07-05 VITALS — BP 152/80 | HR 65 | Temp 98.0°F | Resp 20 | Ht 72.0 in | Wt 170.4 lb

## 2018-07-05 DIAGNOSIS — N186 End stage renal disease: Secondary | ICD-10-CM | POA: Insufficient documentation

## 2018-07-05 DIAGNOSIS — Z992 Dependence on renal dialysis: Secondary | ICD-10-CM | POA: Insufficient documentation

## 2018-07-05 NOTE — H&P (View-Only) (Signed)
REASON FOR CONSULT:    To evaluate for hemodialysis access.  The consult is requested by Dr. Elmarie Shiley.  ASSESSMENT & PLAN:   END-STAGE RENAL DISEASE: This patient has had a previous left forearm graft and right upper arm brachiocephalic fistula.  Based on his vein map he does not appear to be a candidate for a fistula.  I think his next logical option would be a left upper arm graft.  We have discussed the indications for the procedure and the potential complications and he is agreeable to proceed.  He dialyzes on Monday Wednesdays and Fridays.  This is been scheduled for Tuesday, 07/11/2018.   [name], MD, FACS Beeper [phone] Office: [phone]   HPI:   Kyle [name] is a pleasant 63 y.o. male, who has been on dialysis for about 4 years now.  He had a left forearm graft and had to have segments of this removed because of infection.  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  He had a tunneled dialysis catheter placed about 3 weeks ago by [facility].  He is referred for evaluation for new access.  He dialyzes on Monday Wednesdays and Fridays.  He denies any recent uremic symptoms.  Specifically, he denies nausea, vomiting, fatigue, anorexia, or palpitations.  He does not have a pacemaker.  He is not on any blood thinners.  Past Medical History:  Diagnosis Date  . CAD (coronary artery disease)   . Cardiac arrest ([place])    07/2014-[place] Sun initiated  . CHF (congestive heart failure) ([place])   . Diabetes mellitus without complication (HCC)    Type 2  . ESRD (end stage renal disease) ([place])    [place] M/W/F  . Heart murmur    as a child  . Hypertension   . Infection and inflammatory reaction due to cardiac device, implant, and graft ([place]) 10/06/2015  . Staphylococcus aureus bacteremia 08/29/2015    Family History  Problem Relation Age of Onset  . Heart failure Mother   . Diabetes Mother   . Hypertension Mother   . Diabetes  Father     SOCIAL HISTORY: Social History   Socioeconomic History  . Marital status: Married    Spouse name: Not on file  . Number of children: Not on file  . Years of education: Not on file  . Highest education level: Not on file  Occupational History  . Not on file  Social Needs  . Financial resource strain: Not on file  . Food insecurity:    Worry: Not on file    Inability: Not on file  . Transportation needs:    Medical: Not on file    Non-medical: Not on file  Tobacco Use  . Smoking status: Former Smoker    Types: Cigarettes    Last attempt to quit: 06/1982    Years since quitting: 36.0  . Smokeless tobacco: Never Used  Substance and Sexual Activity  . Alcohol use: No  . Drug use: No  . Sexual activity: Not on file  Lifestyle  . Physical activity:    Days per week: Not on file    Minutes per session: Not on file  . Stress: Not on file  Relationships  . Social connections:    Talks on phone: Not on file    Gets together: Not on file    Attends religious service: Not on file    Active member of club or organization: Not on file  Attends meetings of clubs or organizations: Not on file    Relationship status: Not on file  . Intimate partner violence:    Fear of current or ex partner: Not on file    Emotionally abused: Not on file    Physically abused: Not on file    Forced sexual activity: Not on file  Other Topics Concern  . Not on file  Social History Narrative  . Not on file    Allergies  Allergen Reactions  . No Known Allergies     Current Outpatient Medications  Medication Sig Dispense Refill  . aspirin 81 MG tablet Take 81 mg by mouth daily.    . B Complex-C-Folic Acid (RENA-VITE RX) 1 MG TABS Take 1 tablet by mouth daily.  6  . carvedilol (COREG) 12.5 MG tablet Take 1 tablet (12.5 mg total) by mouth 2 (two) times daily with a meal. 180 tablet 0  . doxercalciferol (HECTOROL) 4 MCG/2ML injection Inject 2.5 mLs (5 mcg total) into the vein every  Monday, Wednesday, and Friday with hemodialysis.    Marland Kitchen glimepiride (AMARYL) 2 MG tablet Take 1 tablet (2 mg total) by mouth daily with breakfast. (Patient taking differently: Take 2 mg by mouth daily after lunch. ) 30 tablet 1  . lisinopril (PRINIVIL,ZESTRIL) 20 MG tablet TAKE 1 TABLET BY MOUTH DAILY 30 tablet 0   Current Facility-Administered Medications  Medication Dose Route Frequency Provider Last Rate Last Dose  . dextrose 5 %-0.45 % sodium chloride infusion   Intravenous Continuous Conrad [place], MD        REVIEW OF SYSTEMS:  [X]  denotes positive finding, [ ]  denotes negative finding Cardiac  Comments:  Chest pain or chest pressure:    Shortness of breath upon exertion:    Short of breath when lying flat:    Irregular heart rhythm:        Vascular    Pain in calf, thigh, or hip brought on by ambulation:    Pain in feet at night that wakes you up from your sleep:     Blood clot in your veins:    Leg swelling:         Pulmonary    Oxygen at home:    Productive cough:     Wheezing:         Neurologic    Sudden weakness in arms or legs:     Sudden numbness in arms or legs:     Sudden onset of difficulty speaking or slurred speech:    Temporary loss of vision in one eye:     Problems with dizziness:         Gastrointestinal    Blood in stool:     Vomited blood:         Genitourinary    Burning when urinating:     Blood in urine:        Psychiatric    Major depression:         Hematologic    Bleeding problems:    Problems with blood clotting too easily:        Skin    Rashes or ulcers:        Constitutional    Fever or chills:     PHYSICAL EXAM:   Vitals:   07/05/18 1507  BP: (!) 152/80  Pulse: 65  Resp: 20  Temp: 98 F (36.7 C)  SpO2: 99%  Weight: 170 lb 6.4 oz (77.3 kg)  Height: 6' (1.829  m)    GENERAL: The patient is a well-nourished male, in no acute distress. The vital signs are documented above. CARDIAC: There is a regular rate and rhythm.   VASCULAR: I do not detect carotid bruits. He has palpable radial pulses bilaterally. He has an aneurysmal, chronically occluded, right brachiocephalic fistula He has a chronically occluded left forearm AV graft. PULMONARY: There is good air exchange bilaterally without wheezing or rales. ABDOMEN: Soft and non-tender with normal pitched bowel sounds.  MUSCULOSKELETAL: There are no major deformities or cyanosis. NEUROLOGIC: No focal weakness or paresthesias are detected. SKIN: There are no ulcers or rashes noted. PSYCHIATRIC: The patient has a normal affect.  DATA:    VEIN MAP: I have independently interpreted his vein map today.  On the right side the forearm cephalic vein is small.  The upper arm cephalic vein is not visualized.  The right basilic vein looks reasonable in size but is not visualized in the upper arm.  Likely empties into the brachial system early.  On the left side the forearm cephalic vein is small.  The cephalic vein at the antecubital level is small.  Beyond that the vein is potentially usable.  The basilic vein on the left narrows down to the mid upper arm.  The patient has had previous access in the right arm.  ARTERIAL DUPLEX: I have independently interpreted his upper extremity arterial duplex scan today.  On the right side there is a triphasic radial and ulnar waveform.  Brachial artery measures 0.5 cm in diameter.  On the left side there is a triphasic radial and ulnar waveform.  Brachial artery measures 0.53 cm in diameter.

## 2018-07-05 NOTE — Progress Notes (Signed)
REASON FOR CONSULT:    To evaluate for hemodialysis access.  The consult is requested by Dr. Elmarie Shiley.  ASSESSMENT & PLAN:   END-STAGE RENAL DISEASE: This patient has had a previous left forearm graft and right upper arm brachiocephalic fistula.  Based on his vein map he does not appear to be a candidate for a fistula.  I think his next logical option would be a left upper arm graft.  We have discussed the indications for the procedure and the potential complications and he is agreeable to proceed.  He dialyzes on Monday Wednesdays and Fridays.  This is been scheduled for Tuesday, 07/11/2018.   [name], MD, FACS Beeper [phone] Office: [phone]   HPI:   Kyle [name] is a pleasant 63 y.o. male, who has been on dialysis for about 4 years now.  He had a left forearm graft and had to have segments of this removed because of infection.  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  He had a tunneled dialysis catheter placed about 3 weeks ago by [facility].  He is referred for evaluation for new access.  He dialyzes on Monday Wednesdays and Fridays.  He denies any recent uremic symptoms.  Specifically, he denies nausea, vomiting, fatigue, anorexia, or palpitations.  He does not have a pacemaker.  He is not on any blood thinners.  Past Medical History:  Diagnosis Date  . CAD (coronary artery disease)   . Cardiac arrest ([place])    07/2014-[place] initiated  . CHF (congestive heart failure) ([place])   . Diabetes mellitus without complication (HCC)    Type 2  . ESRD (end stage renal disease) ([place])    [place] M/W/F  . Heart murmur    as a child  . Hypertension   . Infection and inflammatory reaction due to cardiac device, implant, and graft ([place]) 10/06/2015  . Staphylococcus aureus bacteremia 08/29/2015    Family History  Problem Relation Age of Onset  . Heart failure Mother   . Diabetes Mother   . Hypertension Mother   . Diabetes  Father     SOCIAL HISTORY: Social History   Socioeconomic History  . Marital status: Married    Spouse name: Not on file  . Number of children: Not on file  . Years of education: Not on file  . Highest education level: Not on file  Occupational History  . Not on file  Social Needs  . Financial resource strain: Not on file  . Food insecurity:    Worry: Not on file    Inability: Not on file  . Transportation needs:    Medical: Not on file    Non-medical: Not on file  Tobacco Use  . Smoking status: Former Smoker    Types: Cigarettes    Last attempt to quit: 06/1982    Years since quitting: 36.0  . Smokeless tobacco: Never Used  Substance and Sexual Activity  . Alcohol use: No  . Drug use: No  . Sexual activity: Not on file  Lifestyle  . Physical activity:    Days per week: Not on file    Minutes per session: Not on file  . Stress: Not on file  Relationships  . Social connections:    Talks on phone: Not on file    Gets together: Not on file    Attends religious service: Not on file    Active member of club or organization: Not on file  Attends meetings of clubs or organizations: Not on file    Relationship status: Not on file  . Intimate partner violence:    Fear of current or ex partner: Not on file    Emotionally abused: Not on file    Physically abused: Not on file    Forced sexual activity: Not on file  Other Topics Concern  . Not on file  Social History Narrative  . Not on file    Allergies  Allergen Reactions  . No Known Allergies     Current Outpatient Medications  Medication Sig Dispense Refill  . aspirin 81 MG tablet Take 81 mg by mouth daily.    . B Complex-C-Folic Acid (RENA-VITE RX) 1 MG TABS Take 1 tablet by mouth daily.  6  . carvedilol (COREG) 12.5 MG tablet Take 1 tablet (12.5 mg total) by mouth 2 (two) times daily with a meal. 180 tablet 0  . doxercalciferol (HECTOROL) 4 MCG/2ML injection Inject 2.5 mLs (5 mcg total) into the vein every  Monday, Wednesday, and Friday with hemodialysis.    Marland Kitchen glimepiride (AMARYL) 2 MG tablet Take 1 tablet (2 mg total) by mouth daily with breakfast. (Patient taking differently: Take 2 mg by mouth daily after lunch. ) 30 tablet 1  . lisinopril (PRINIVIL,ZESTRIL) 20 MG tablet TAKE 1 TABLET BY MOUTH DAILY 30 tablet 0   Current Facility-Administered Medications  Medication Dose Route Frequency Provider Last Rate Last Dose  . dextrose 5 %-0.45 % sodium chloride infusion   Intravenous Continuous Conrad Osgood, MD        REVIEW OF SYSTEMS:  [X]  denotes positive finding, [ ]  denotes negative finding Cardiac  Comments:  Chest pain or chest pressure:    Shortness of breath upon exertion:    Short of breath when lying flat:    Irregular heart rhythm:        Vascular    Pain in calf, thigh, or hip brought on by ambulation:    Pain in feet at night that wakes you up from your sleep:     Blood clot in your veins:    Leg swelling:         Pulmonary    Oxygen at home:    Productive cough:     Wheezing:         Neurologic    Sudden weakness in arms or legs:     Sudden numbness in arms or legs:     Sudden onset of difficulty speaking or slurred speech:    Temporary loss of vision in one eye:     Problems with dizziness:         Gastrointestinal    Blood in stool:     Vomited blood:         Genitourinary    Burning when urinating:     Blood in urine:        Psychiatric    Major depression:         Hematologic    Bleeding problems:    Problems with blood clotting too easily:        Skin    Rashes or ulcers:        Constitutional    Fever or chills:     PHYSICAL EXAM:   Vitals:   07/05/18 1507  BP: (!) 152/80  Pulse: 65  Resp: 20  Temp: 98 F (36.7 C)  SpO2: 99%  Weight: 170 lb 6.4 oz (77.3 kg)  Height: 6' (1.829  m)    GENERAL: The patient is a well-nourished male, in no acute distress. The vital signs are documented above. CARDIAC: There is a regular rate and rhythm.   VASCULAR: I do not detect carotid bruits. He has palpable radial pulses bilaterally. He has an aneurysmal, chronically occluded, right brachiocephalic fistula He has a chronically occluded left forearm AV graft. PULMONARY: There is good air exchange bilaterally without wheezing or rales. ABDOMEN: Soft and non-tender with normal pitched bowel sounds.  MUSCULOSKELETAL: There are no major deformities or cyanosis. NEUROLOGIC: No focal weakness or paresthesias are detected. SKIN: There are no ulcers or rashes noted. PSYCHIATRIC: The patient has a normal affect.  DATA:    VEIN MAP: I have independently interpreted his vein map today.  On the right side the forearm cephalic vein is small.  The upper arm cephalic vein is not visualized.  The right basilic vein looks reasonable in size but is not visualized in the upper arm.  Likely empties into the brachial system early.  On the left side the forearm cephalic vein is small.  The cephalic vein at the antecubital level is small.  Beyond that the vein is potentially usable.  The basilic vein on the left narrows down to the mid upper arm.  The patient has had previous access in the right arm.  ARTERIAL DUPLEX: I have independently interpreted his upper extremity arterial duplex scan today.  On the right side there is a triphasic radial and ulnar waveform.  Brachial artery measures 0.5 cm in diameter.  On the left side there is a triphasic radial and ulnar waveform.  Brachial artery measures 0.53 cm in diameter.

## 2018-07-10 ENCOUNTER — Encounter (HOSPITAL_COMMUNITY): Payer: Self-pay | Admitting: Vascular Surgery

## 2018-07-10 ENCOUNTER — Other Ambulatory Visit (HOSPITAL_COMMUNITY)
Admission: RE | Admit: 2018-07-10 | Discharge: 2018-07-10 | Disposition: A | Discharge status: Home / Self Care | Payer: Medicare Other | Source: Ambulatory Visit | Attending: Vascular Surgery | Admitting: Vascular Surgery

## 2018-07-10 DIAGNOSIS — Z1159 Encounter for screening for other viral diseases: Secondary | ICD-10-CM | POA: Insufficient documentation

## 2018-07-10 LAB — SARS CORONAVIRUS 2 BY RT PCR (HOSPITAL ORDER, PERFORMED IN ~~LOC~~ HOSPITAL LAB): SARS Coronavirus 2: NEGATIVE

## 2018-07-10 NOTE — Progress Notes (Signed)
Anesthesia Chart Review:  Case: [id number] Date/Time: 07/11/18 0915   Procedure: INSERTION OF ARTERIOVENOUS (AV) GORE-TEX GRAFT LEFT ARM (Left )   Anesthesia type: Monitor Anesthesia Care   Pre-op diagnosis: END STAGE RENAL DISEASE FOR HEMODIALYSIS ACCESS   Location: [facility] OR ROOM 12 / [facility]   Surgeon: Angelia Mould, MD      DISCUSSION: Patient is a 63 year old male scheduled for the above procedure. He has ESRD and undergoes hemodialysis MWF. He had a previous left FA AVGG that had to be revised in 2017 for infection (MSSA bacteremia) and then a right brachiocephalic AVF that is now occluded. A tunneled dialysis catheter was placed ~ 05/2018, so a new permanent HD access is needed.    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  Significant past medical/perioperative event (07/2014): - On 08/20/14, he was admitted with acute on chronic combined systolic and diastolic CHF (EF 88%) in the setting of acute on chronic CKD stage V and failed cardiology and nephrology follow-up since acute CHF admission 12/2013. Hemodialysis catheter insertion with initiation of hemodialysis followed by cardiac cath planned. On 08/21/14 he underwent right IJ HD catheter insertion. Procedure was started as MAC but he then developed significant bradycardia (59 to 29 bpm), hypotension and given atropine and epinephrine, CPR for PEA arrest initiated. Patient was intubated, femoral line inserted. CPR was administered for approximately 12 minutes with ROSC. He was transferred to ICU for PCCM and cardiology evaluations. [employer] protocol initiated. 08/22/14 echo showed EF down to 10% with question of cardiac amyloidosis. On 09/02/14, EP cardiologist Dr. Caryl Comes was consulted. Patient not felt to be a  candidate for CRT due to narrow QRS complex. Would possibly consider ICD in the future but would have to exclude ischemic heart disease and amyloid first. 09/04/14 cardiac cath showed 1V CAD in a non-dominant RCA and medical therapy was recommended. 09/06/15 abdominal fat pad biopsy was negative for amyloid. Out-patient follow-up echo 03/2015 showed normalization of his EF.  One of our PAT RN is still attempting to reach patient to complete phone interview. He had an ischemic work-up within the past two years, and although his RCA had 70% stenosis it was too small to intervene and otherwise on 20-35% blockages elsewhere.  His EF normalized 03/2015. He undergoes hemodialysis 3x/week. He will need further evaluation by his anesthesia, but if no acute or progressive CV symptoms and labs acceptable then I would anticipate that he could undergo HD access procedure that is anticipated MAC anesthesia.   BP Readings from Last 3 Encounters:  07/05/18 (!) 152/80  09/23/16 (!) 177/88  09/16/16 125/74    07/10/18 presurgical COVID test was negative.   PROVIDERS: Patient, No Pcp Per  - Last PCP note was with Marlou Starks, DO ([place] [place]). Her 09/05/17 note mentions that he previously saw Hayden Rasmussen, MD.  - Nephrologist is through [facility]. He has been evaluated at the [facility], but as of 12/09/17 it looks like he was just awaiting financial clearance and then would need renal transplant status re-evaluated.  Kirk Ruths, MD is cardiologist. Last cardiology encounter seen was 08/2016 at that time of his cardiac cath with medical therapy recommended because RCA was too small for PCI.  LABS: For day of surgery.    EKG: He needs an updated EKG.    CV: Cardiac cath 09/23/16:  Ost LAD to Mid LAD lesion, 25 %stenosed.  1st Mrg lesion, 20 %stenosed.  Prox Cx to Mid Cx lesion, 20 %stenosed.  Prox LAD lesion, 35  %stenosed.  Prox RCA lesion, 70 %stenosed.  The left ventricular systolic function is normal.  LV end diastolic pressure is normal.  The left ventricular ejection fraction is 55-65% by visual estimate. 1. Single vessel CAD. There is a 70% stenosis in a small nondominant RCA.  2. Normal LV function.  3. Normal LVEDP Plan: recommend continued medical therapy. The RCA is too small to consider PCI.   Echo 06/22/16 [facility]): SUMMARY The left ventricular size is normal. There is normal left ventricular wall thickness. Left ventricular systolic function is normal. LV ejection fraction = 55-60%. Left ventricular filling pattern is impaired relaxation. The right ventricle is normal in size and function. The left atrium is mildly dilated. There is aortic valve sclerosis. There is mild aortic regurgitation. There is mild mitral regurgitation. No pulmonary hypertension. The IVC is normal in size with an inspiratory collapse of greater then 50%,  suggesting normal right atrial pressure. There is no pericardial effusion. There is no comparison study available. (Comparison: 08/30/15 LVEF 55-60%; 04/02/15 LVEF 55-60%; 08/20/14 LVEF 10%;01/14/14 LVEF 15%)   Past Medical History:  Diagnosis Date  . CAD (coronary artery disease)   . Cardiac arrest ([place])    07/2014-[place] initiated  . CHF (congestive heart failure) ([place])   . Diabetes mellitus without complication (HCC)    Type 2  . ESRD (end stage renal disease) ([place])    [place] M/W/F  . Heart murmur    as a child  . Hypertension   . Infection and inflammatory reaction due to cardiac device, implant, and graft ([place]) 10/06/2015  . Staphylococcus aureus bacteremia 08/29/2015    Past Surgical History:  Procedure Laterality Date  . AV FISTULA PLACEMENT Left 09/11/2014   Procedure: INSERTION OF LEFT ARM 6MM ARTERIOVENOUS GORTEX GRAFT ;  Surgeon: Elam Dutch, MD;  Location: [facility];  Service: Vascular;  Laterality: Left;  . AV  FISTULA PLACEMENT Right 10/14/2015   Procedure: ARTERIOVENOUS (AV) FISTULA CREATION;  Surgeon: Angelia Mould, MD;  Location: [facility];  Service: Vascular;  Laterality: Right;  . CARDIAC CATHETERIZATION N/A 09/04/2014   Procedure: Right/Left Heart Cath and Coronary Angiography;  Surgeon: Peter M Martinique, MD;  Location: [facility] CV LAB;  Service: Cardiovascular;  Laterality: N/A;  . EXTERNAL EAR SURGERY    . FINGER SURGERY     for infection  . INSERTION OF DIALYSIS CATHETER N/A 08/21/2014   Procedure: INSERTION OF DIALYSIS CATHETER RIGHT INTERNAL JUGULAR VEIN;  Surgeon: Conrad Darrington, MD;  Location: [place] [facility];  Service: Vascular;  Laterality: N/A;  MAC to General  . INSERTION OF DIALYSIS CATHETER N/A 09/08/2015   Procedure: INSERTION OF DIALYSIS CATHETER RIGHT INTERNAL JUGULAR;  Surgeon: Rosetta Posner, MD;  Location: [facility] OR;  Service: Vascular;  Laterality: N/A;  . LEFT HEART CATH AND CORONARY ANGIOGRAPHY N/A 09/23/2016   Procedure: LEFT HEART CATH AND CORONARY ANGIOGRAPHY;  Surgeon: Martinique, Peter M, MD;  Location: [place] CV LAB;  Service: Cardiovascular;  Laterality: N/A;  . PATCH ANGIOPLASTY Right 12/04/2015   Procedure: WITH 1 X 6 [place] ANGIOPLASTY TO RIGHT ARM BRACHIO-CEPHALIC FISTULA;  Surgeon: Conrad Gold Beach, MD;  Location: [facility];  Service: Vascular;  Laterality: Right;  . PERIPHERAL VASCULAR CATHETERIZATION N/A 12/04/2015   Procedure: Fistulagram - Right Arm;  Surgeon: Conrad Cloverport, MD;  Location: [facility] CV LAB;  Service: Cardiovascular;  Laterality: N/A;  . REVISION OF ARTERIOVENOUS GORETEX GRAFT Left 08/31/2015   Procedure: REVISION OF LEFT ARTERIOVENOUS GORETEX GRAFT USING  AN INTERPOSITION  4-7MM GORTEX GRAFT WITH REMOVAL OF INFECTED STENT;  INTRAOPERATIVE ARTERIOGRAM TIMES ONE.;  Surgeon: Angelia Mould, MD;  Location: [facility];  Service: Vascular;  Laterality: Left;  . REVISON OF ARTERIOVENOUS FISTULA Right 12/04/2015   Procedure: REVISON OF RIGHT  BRACHIO--CEPHALIC  ARTERIOVENOUS FISTULA;  Surgeon: Conrad Matinecock, MD;  Location: [facility];  Service: Vascular;  Laterality: Right;  . TONSILLECTOMY      MEDICATIONS: . dextrose 5 %-0.45 % sodium chloride infusion   . aspirin 81 MG tablet  . B Complex-C-Folic Acid (RENA-VITE RX) 1 MG TABS  . carvedilol (COREG) 12.5 MG tablet  . doxercalciferol (HECTOROL) 4 MCG/2ML injection  . glimepiride (AMARYL) 2 MG tablet  . lisinopril (PRINIVIL,ZESTRIL) 20 MG tablet    Myra Gianotti, PA-C Surgical Short Stay/Anesthesiology [facility] Phone [phone] [facility] Phone [phone] 07/10/2018 4:04 PM

## 2018-07-10 NOTE — Progress Notes (Signed)
I was unable to reach patient by phone.  I left  A message on voice mail.  I instructed the patient to arrive at [facility] entrance at  , register in the [facility]. DO NOT eat or drink anything after midnight.  I instructed the patient to take the following medications in the am with just enough water to get them down: ASA, Coreg. I asked patient to not wear any lotions, powders, cologne, jewelry, piercing, make-up or nail polish. Shower,  wear clean clothes. Brush teeth. I informed patient that there will need to be a driver and someone to stay with him/her for the first 24 hours after surgery.   I instructed  patient to call [phone], in the am if there were any questions or problems. I instructed patient to not take Amaryl in am. I instructed patient to check CBG after awaking and every 2 hours until arrival  to the hospital.  I Instructed patient if CBG is less than 70 to take 4 Glucose Tablets or 1 tube of Glucose Gel or 1/2 cup of a clear juice. Recheck CBG in 15 minutes then call pre- op desk at [phone] for further instructions.

## 2018-07-10 NOTE — Anesthesia Preprocedure Evaluation (Addendum)
Anesthesia Evaluation  Patient identified by MRN, date of birth, ID band Patient awake    Reviewed: Allergy & Precautions, NPO status , Patient's Chart, lab work & pertinent test results, reviewed documented beta blocker date and time   History of Anesthesia Complications Negative for: history of anesthetic complications  Airway Mallampati: I  TM Distance: >3 FB Neck ROM: Full    Dental  (+) Dental Advisory Given   Pulmonary former smoker (quit 1984),    breath sounds clear to auscultation       Cardiovascular hypertension, Pt. on medications and Pt. on home beta blockers (-) angina+ CAD  + dysrhythmias (NSVT)  Rhythm:Regular Rate:Normal  '18 Cath: Single vessel CAD. There is a 70% stenosis in a small nondominant RCA. Normal LV function. Normal LVEDP   Neuro/Psych negative neurological ROS     GI/Hepatic negative GI ROS, Neg liver ROS,   Endo/Other  diabetes (glu 107), Oral Hypoglycemic AgentsHypothyroidism   Renal/GU ESRF and DialysisRenal disease (K+ 4.2)     Musculoskeletal   Abdominal   Peds  Hematology negative hematology ROS (+)   Anesthesia Other Findings   Reproductive/Obstetrics                           Anesthesia Physical Anesthesia Plan  ASA: III  Anesthesia Plan: MAC   Post-op Pain Management:    Induction:   PONV Risk Score and Plan: 1 and Ondansetron  Airway Management Planned: Natural Airway and Simple Face Mask  Additional Equipment:   Intra-op Plan:   Post-operative Plan:   Informed Consent: I have reviewed the patients History and Physical, chart, labs and discussed the procedure including the risks, benefits and alternatives for the proposed anesthesia with the patient or authorized representative who has indicated his/her understanding and acceptance.     Dental advisory given  Plan Discussed with: CRNA and Surgeon  Anesthesia Plan Comments: (PAT  note written 07/10/2018 by Myra Gianotti, PA-C. )      Anesthesia Quick Evaluation

## 2018-07-11 ENCOUNTER — Encounter (HOSPITAL_COMMUNITY)
Admission: RE | Disposition: A | Discharge status: Home / Self Care | Payer: Self-pay | Source: Home / Self Care | Attending: Vascular Surgery

## 2018-07-11 ENCOUNTER — Ambulatory Visit (HOSPITAL_COMMUNITY)
Admission: RE | Admit: 2018-07-11 | Discharge: 2018-07-11 | Disposition: A | Discharge status: Home / Self Care | Payer: Medicare Other | Attending: Vascular Surgery | Admitting: Vascular Surgery

## 2018-07-11 ENCOUNTER — Ambulatory Visit (HOSPITAL_COMMUNITY): Payer: Medicare Other | Admitting: Anesthesiology

## 2018-07-11 ENCOUNTER — Encounter (HOSPITAL_COMMUNITY): Payer: Self-pay

## 2018-07-11 ENCOUNTER — Other Ambulatory Visit: Payer: Self-pay

## 2018-07-11 DIAGNOSIS — I2510 Atherosclerotic heart disease of native coronary artery without angina pectoris: Secondary | ICD-10-CM | POA: Insufficient documentation

## 2018-07-11 DIAGNOSIS — I132 Hypertensive heart and chronic kidney disease with heart failure and with stage 5 chronic kidney disease, or end stage renal disease: Secondary | ICD-10-CM | POA: Diagnosis not present

## 2018-07-11 DIAGNOSIS — Z87891 Personal history of nicotine dependence: Secondary | ICD-10-CM | POA: Diagnosis not present

## 2018-07-11 DIAGNOSIS — Z7982 Long term (current) use of aspirin: Secondary | ICD-10-CM | POA: Insufficient documentation

## 2018-07-11 DIAGNOSIS — Z79899 Other long term (current) drug therapy: Secondary | ICD-10-CM | POA: Diagnosis not present

## 2018-07-11 DIAGNOSIS — Z8674 Personal history of sudden cardiac arrest: Secondary | ICD-10-CM | POA: Diagnosis not present

## 2018-07-11 DIAGNOSIS — N185 Chronic kidney disease, stage 5: Secondary | ICD-10-CM | POA: Diagnosis not present

## 2018-07-11 DIAGNOSIS — T82898A Other specified complication of vascular prosthetic devices, implants and grafts, initial encounter: Secondary | ICD-10-CM | POA: Insufficient documentation

## 2018-07-11 DIAGNOSIS — R9431 Abnormal electrocardiogram [ECG] [EKG]: Secondary | ICD-10-CM | POA: Insufficient documentation

## 2018-07-11 DIAGNOSIS — E1122 Type 2 diabetes mellitus with diabetic chronic kidney disease: Secondary | ICD-10-CM | POA: Insufficient documentation

## 2018-07-11 DIAGNOSIS — Z833 Family history of diabetes mellitus: Secondary | ICD-10-CM | POA: Diagnosis not present

## 2018-07-11 DIAGNOSIS — X58XXXA Exposure to other specified factors, initial encounter: Secondary | ICD-10-CM | POA: Diagnosis not present

## 2018-07-11 DIAGNOSIS — N186 End stage renal disease: Secondary | ICD-10-CM | POA: Insufficient documentation

## 2018-07-11 DIAGNOSIS — Z8249 Family history of ischemic heart disease and other diseases of the circulatory system: Secondary | ICD-10-CM | POA: Insufficient documentation

## 2018-07-11 DIAGNOSIS — R55 Syncope and collapse: Secondary | ICD-10-CM | POA: Diagnosis not present

## 2018-07-11 DIAGNOSIS — I509 Heart failure, unspecified: Secondary | ICD-10-CM | POA: Insufficient documentation

## 2018-07-11 DIAGNOSIS — Z992 Dependence on renal dialysis: Secondary | ICD-10-CM | POA: Insufficient documentation

## 2018-07-11 DIAGNOSIS — Z7984 Long term (current) use of oral hypoglycemic drugs: Secondary | ICD-10-CM | POA: Diagnosis not present

## 2018-07-11 HISTORY — PX: AV FISTULA PLACEMENT: SHX1204

## 2018-07-11 LAB — GLUCOSE, CAPILLARY
Glucose-Capillary: 66 mg/dL — ABNORMAL LOW (ref 70–99)
Glucose-Capillary: 107 mg/dL — ABNORMAL HIGH (ref 70–99)
Glucose-Capillary: 87 mg/dL (ref 70–99)
Glucose-Capillary: 73 mg/dL (ref 70–99)
Glucose-Capillary: 108 mg/dL — ABNORMAL HIGH (ref 70–99)
Glucose-Capillary: 66 mg/dL — ABNORMAL LOW (ref 70–99)
Glucose-Capillary: 96 mg/dL (ref 70–99)

## 2018-07-11 LAB — POCT I-STAT 4, (NA,K, GLUC, HGB,HCT)
Glucose, Bld: 69 mg/dL — ABNORMAL LOW (ref 70–99)
HCT: 36 % — ABNORMAL LOW (ref 39.0–52.0)
Hemoglobin: 12.2 g/dL — ABNORMAL LOW (ref 13.0–17.0)
Potassium: 4.2 mmol/L (ref 3.5–5.1)
Sodium: 139 mmol/L (ref 135–145)

## 2018-07-11 SURGERY — INSERTION OF ARTERIOVENOUS (AV) GORE-TEX GRAFT ARM
Anesthesia: Monitor Anesthesia Care | Site: Arm Upper | Laterality: Left

## 2018-07-11 MED ORDER — THROMBIN (RECOMBINANT) 20000 UNITS EX SOLR
CUTANEOUS | Status: AC
Start: 2018-07-11 — End: ?
  Filled 2018-07-11: qty 20000

## 2018-07-11 MED ORDER — LIDOCAINE-EPINEPHRINE (PF) 1 %-1:200000 IJ SOLN
INTRAMUSCULAR | Status: AC
  Filled 2018-07-11: qty 30

## 2018-07-11 MED ORDER — SODIUM CHLORIDE 0.9 % IV SOLN
INTRAVENOUS | Status: DC | PRN
  Administered 2018-07-11: 500 mL

## 2018-07-11 MED ORDER — CHLORHEXIDINE GLUCONATE 4 % EX LIQD: 60.0000 mL | Freq: Once | CUTANEOUS | Status: DC

## 2018-07-11 MED ORDER — OXYCODONE-ACETAMINOPHEN 5-325 MG PO TABS: 1.0000 | ORAL_TABLET | Freq: Four times a day (QID) | ORAL | 0 refills | Status: DC | PRN

## 2018-07-11 MED ORDER — LIDOCAINE HCL (PF) 1 % IJ SOLN
INTRAMUSCULAR | Status: DC | PRN
  Administered 2018-07-11: 11 mL

## 2018-07-11 MED ORDER — DEXTROSE 50 % IV SOLN
INTRAVENOUS | Status: AC
  Filled 2018-07-11: qty 50

## 2018-07-11 MED ORDER — 0.9 % SODIUM CHLORIDE (POUR BTL) OPTIME
TOPICAL | Status: DC | PRN
  Administered 2018-07-11: 1000 mL

## 2018-07-11 MED ORDER — PROPOFOL 500 MG/50ML IV EMUL
INTRAVENOUS | Status: DC | PRN
  Administered 2018-07-11: 100 ug/kg/min via INTRAVENOUS

## 2018-07-11 MED ORDER — DEXTROSE 50 % IV SOLN
25.0000 mL | Freq: Once | INTRAVENOUS | Status: AC
  Administered 2018-07-11: 25 mL via INTRAVENOUS
  Filled 2018-07-11: qty 50

## 2018-07-11 MED ORDER — FENTANYL CITRATE (PF) 250 MCG/5ML IJ SOLN
INTRAMUSCULAR | Status: AC
  Filled 2018-07-11: qty 5

## 2018-07-11 MED ORDER — DEXTROSE 50 % IV SOLN
INTRAVENOUS | Status: AC
  Administered 2018-07-11: 25 mL via INTRAVENOUS
  Filled 2018-07-11: qty 50

## 2018-07-11 MED ORDER — LIDOCAINE-EPINEPHRINE (PF) 1 %-1:200000 IJ SOLN
INTRAMUSCULAR | Status: DC | PRN
  Administered 2018-07-11: 30 mL

## 2018-07-11 MED ORDER — FENTANYL CITRATE (PF) 100 MCG/2ML IJ SOLN
INTRAMUSCULAR | Status: DC | PRN
  Administered 2018-07-11: 75 ug via INTRAVENOUS

## 2018-07-11 MED ORDER — SODIUM CHLORIDE 0.9 % IV SOLN
INTRAVENOUS | Status: DC
  Administered 2018-07-11: 08:00:00 via INTRAVENOUS

## 2018-07-11 MED ORDER — SODIUM CHLORIDE 0.9 % IV SOLN
INTRAVENOUS | Status: AC
  Filled 2018-07-11: qty 1.2

## 2018-07-11 MED ORDER — PROTAMINE SULFATE 10 MG/ML IV SOLN
INTRAVENOUS | Status: DC | PRN
  Administered 2018-07-11 (×4): 10 mg via INTRAVENOUS

## 2018-07-11 MED ORDER — PROPOFOL 10 MG/ML IV BOLUS
INTRAVENOUS | Status: AC
  Filled 2018-07-11: qty 20

## 2018-07-11 MED ORDER — HEPARIN SODIUM (PORCINE) 1000 UNIT/ML IJ SOLN
INTRAMUSCULAR | Status: DC | PRN
  Administered 2018-07-11: 7000 [IU] via INTRAVENOUS

## 2018-07-11 MED ORDER — CEFAZOLIN SODIUM-DEXTROSE 2-4 GM/100ML-% IV SOLN
2.0000 g | INTRAVENOUS | Status: AC
  Administered 2018-07-11: 14:00:00 2 g via INTRAVENOUS
  Filled 2018-07-11: qty 100

## 2018-07-11 MED ORDER — LIDOCAINE HCL (PF) 1 % IJ SOLN
INTRAMUSCULAR | Status: AC
  Filled 2018-07-11: qty 30

## 2018-07-11 SURGICAL SUPPLY — 39 items
ADH SKN CLS APL DERMABOND .7 (GAUZE/BANDAGES/DRESSINGS) ×1
ADH SKN CLS LQ APL DERMABOND (GAUZE/BANDAGES/DRESSINGS) ×1
ARMBAND PINK RESTRICT EXTREMIT (MISCELLANEOUS) ×6 IMPLANT
CANISTER SUCT 3000ML PPV (MISCELLANEOUS) ×3 IMPLANT
CANNULA VESSEL 3MM 2 BLNT TIP (CANNULA) ×3 IMPLANT
CLIP VESOCCLUDE MED 6/CT (CLIP) ×3 IMPLANT
CLIP VESOCCLUDE SM WIDE 6/CT (CLIP) ×5 IMPLANT
COVER WAND RF STERILE (DRAPES) ×3 IMPLANT
DECANTER SPIKE VIAL GLASS SM (MISCELLANEOUS) ×5 IMPLANT
DERMABOND ADHESIVE PROPEN (GAUZE/BANDAGES/DRESSINGS) ×2
DERMABOND ADVANCED (GAUZE/BANDAGES/DRESSINGS) ×2
DERMABOND ADVANCED .7 DNX12 (GAUZE/BANDAGES/DRESSINGS) ×1 IMPLANT
DERMABOND ADVANCED .7 DNX6 (GAUZE/BANDAGES/DRESSINGS) IMPLANT
ELECT REM PT RETURN 9FT ADLT (ELECTROSURGICAL) ×3
ELECTRODE REM PT RTRN 9FT ADLT (ELECTROSURGICAL) ×1 IMPLANT
GLOVE BIO SURGEON STRL SZ7.5 (GLOVE) ×3 IMPLANT
GLOVE BIOGEL PI IND STRL 7.5 (GLOVE) IMPLANT
GLOVE BIOGEL PI IND STRL 8 (GLOVE) ×1 IMPLANT
GLOVE BIOGEL PI INDICATOR 7.5 (GLOVE) ×2
GLOVE BIOGEL PI INDICATOR 8 (GLOVE) ×2
GLOVE ECLIPSE 6.5 STRL STRAW (GLOVE) ×2 IMPLANT
GOWN STRL REUS W/ TWL LRG LVL3 (GOWN DISPOSABLE) ×3 IMPLANT
GOWN STRL REUS W/TWL LRG LVL3 (GOWN DISPOSABLE) ×9
GRAFT GORETEX STRT 4-7X45 (Vascular Products) ×2 IMPLANT
KIT BASIN OR (CUSTOM PROCEDURE TRAY) ×3 IMPLANT
KIT TURNOVER KIT B (KITS) ×3 IMPLANT
NS IRRIG 1000ML POUR BTL (IV SOLUTION) ×3 IMPLANT
PACK CV ACCESS (CUSTOM PROCEDURE TRAY) ×3 IMPLANT
PAD ARMBOARD 7.5X6 YLW CONV (MISCELLANEOUS) ×6 IMPLANT
SPONGE SURGIFOAM ABS GEL 100 (HEMOSTASIS) IMPLANT
SUT PROLENE 6 0 BV (SUTURE) ×8 IMPLANT
SUT VIC AB 3-0 SH 27 (SUTURE) ×9
SUT VIC AB 3-0 SH 27X BRD (SUTURE) ×2 IMPLANT
SUT VIC AB 4-0 PS2 27 (SUTURE) ×2 IMPLANT
SUT VICRYL 4-0 PS2 18IN ABS (SUTURE) ×6 IMPLANT
SYR TOOMEY 50ML (SYRINGE) IMPLANT
TOWEL GREEN STERILE (TOWEL DISPOSABLE) ×3 IMPLANT
UNDERPAD 30X30 (UNDERPADS AND DIAPERS) ×3 IMPLANT
WATER STERILE IRR 1000ML POUR (IV SOLUTION) ×3 IMPLANT

## 2018-07-11 NOTE — Anesthesia Postprocedure Evaluation (Signed)
Anesthesia Post Note  Patient: Kennard E Mineo  Procedure(s) Performed: INSERTION OF ARTERIOVENOUS (AV) GORE-TEX STRETCH VASCULAR GRAFT LEFT ARM (Left Arm Upper)     Patient location during evaluation: PACU Anesthesia Type: MAC Level of consciousness: awake and alert, patient cooperative and oriented Pain management: pain level controlled Vital Signs Assessment: post-procedure vital signs reviewed and stable Respiratory status: spontaneous breathing, nonlabored ventilation and respiratory function stable Cardiovascular status: blood pressure returned to baseline and stable Postop Assessment: no apparent nausea or vomiting Anesthetic complications: no    Last Vitals:  Vitals:   07/11/18 1600 07/11/18 1619  BP: 128/66 137/80  Pulse: (!) 50 (!) 55  Resp: 18 18  Temp:  (!) 36.2 C  SpO2: 100% 100%    Last Pain:  Vitals:   07/11/18 1600  PainSc: 0-No pain                 Peterson Mathey,E. Tarence Searcy

## 2018-07-11 NOTE — Discharge Instructions (Signed)
° °  [facility] ° °Discharge Instructions ° °AV Fistula or Graft Surgery for Dialysis Access ° °Please refer to the following instructions for your post-procedure care. Your surgeon or physician assistant will discuss any changes with you. ° °Activity ° °You may drive the day following your surgery, if you are comfortable and no longer taking prescription pain medication. Resume full activity as the soreness in your incision resolves. ° °Bathing/Showering ° °You may shower after you go home. Keep your incision dry for 48 hours. Do not soak in a bathtub, hot tub, or swim until the incision heals completely. You may not shower if you have a hemodialysis catheter. ° °Incision Care ° °Clean your incision with mild soap and water after 48 hours. Pat the area dry with a clean towel. You do not need a bandage unless otherwise instructed. Do not apply any ointments or creams to your incision. You may have skin glue on your incision. Do not peel it off. It will come off on its own in about one week. Your arm may swell a bit after surgery. To reduce swelling use pillows to elevate your arm so it is above your heart. Your doctor will tell you if you need to lightly wrap your arm with an ACE bandage. ° °Diet ° °Resume your normal diet. There are not special food restrictions following this procedure. In order to heal from your surgery, it is CRITICAL to get adequate nutrition. Your body requires vitamins, minerals, and protein. Vegetables are the best source of vitamins and minerals. Vegetables also provide the perfect balance of protein. Processed food has little nutritional value, so try to avoid this. ° °Medications ° °Resume taking all of your medications. If your incision is causing pain, you may take over-the counter pain relievers such as acetaminophen (Tylenol). If you were prescribed a stronger pain medication, please be aware these medications can cause nausea and constipation. Prevent  nausea by taking the medication with a snack or meal. Avoid constipation by drinking plenty of fluids and eating foods with high amount of fiber, such as fruits, vegetables, and grains. Do not take Tylenol if you are taking prescription pain medications. ° ° ° ° °Follow up °Your surgeon may want to see you in the office following your access surgery. If so, this will be arranged at the time of your surgery. ° °Please call us immediately for any of the following conditions: ° °Increased pain, redness, drainage (pus) from your incision site °Fever of 101 degrees or higher °Severe or worsening pain at your incision site °Hand pain or numbness. ° °Reduce your risk of vascular disease: ° °Stop smoking. If you would like help, call [facility] at 1-800-QUIT-NOW (1-800-784-8669) or [place] at 336-586-4000 ° °Manage your cholesterol °Maintain a desired weight °Control your diabetes °Keep your blood pressure down ° °Dialysis ° °It will take several weeks to several months for your new dialysis access to be ready for use. Your surgeon will determine when it is OK to use it. Your nephrologist will continue to direct your dialysis. You can continue to use your Permcath until your new access is ready for use. ° °If you have any questions, please call the office at [phone]. ° °

## 2018-07-11 NOTE — Transfer of Care (Signed)
Immediate Anesthesia Transfer of Care Note  Patient: Kyle [name]  Procedure(s) Performed: INSERTION OF ARTERIOVENOUS (AV) GORE-TEX STRETCH VASCULAR GRAFT LEFT ARM (Left Arm Upper)  Patient Location: PACU  Anesthesia Type:MAC  Level of Consciousness: awake, alert  and oriented  Airway & Oxygen Therapy: Patient Spontanous Breathing  Post-op Assessment: Report given to RN and Post -op Vital signs reviewed and stable  Post vital signs: Reviewed and stable  Last Vitals:  Vitals Value Taken Time  BP 126/65 07/11/18 1518  Temp    Pulse 61 07/11/18 1521  Resp 14 07/11/18 1521  SpO2 100 % 07/11/18 1521  Vitals shown include unvalidated device data.  Last Pain:  Vitals:   07/11/18 0823  PainSc: 0-No pain         Complications: No apparent anesthesia complications

## 2018-07-11 NOTE — Anesthesia Procedure Notes (Signed)
Procedure Name: MAC Date/Time: 07/11/2018 1:35 PM Performed by: Eligha Bridegroom, CRNA Pre-anesthesia Checklist: Patient identified, Emergency Drugs available, Suction available, Patient being monitored and Timeout performed Patient Re-evaluated:Patient Re-evaluated prior to induction Oxygen Delivery Method: Nasal cannula Preoxygenation: Pre-oxygenation with 100% oxygen Induction Type: IV induction

## 2018-07-11 NOTE — Op Note (Signed)
    NAME: DEMITRUS FRANCISCO    MRN: [id number] DOB: December 03, 1955    DATE OF OPERATION: 07/11/2018  PREOP DIAGNOSIS:    End-stage renal disease  POSTOP DIAGNOSIS:    Same  PROCEDURE:    New left upper arm AV graft (4-7 mm PTFE graft)  SURGEON: Judeth Cornfield. Scot Dock, MD, FACS  ASSIST: Laurence Slate, PA  ANESTHESIA: Local with sedation  EBL: Minimal  INDICATIONS:    Kyle [name] is a 63 y.o. male who presents for new access.  FINDINGS:   I placed an upper arm graft given that the basilic vein was short and thickened and emptied into the basilic system early.  The upper arm cephalic vein was very small.  There was a good thrill at the completion of the procedure.  The high brachial vein was large.  The patient had a dampened monophasic radial signal at the completion of the procedure.  TECHNIQUE:   The patient was taken to the operating room and sedated by anesthesia.  The left upper extremity was prepped and draped in usual sterile fashion.  The patient had a previous left forearm graft that was removed partly for infection.  A longitudinal incision was made above the antecubital level after the skin was anesthetized.  The brachial artery was dissected free above the previous anastomosis.  Next a separate longitudinal incision was made beneath the axilla after the skin was anesthetized.  Here the high brachial vein was dissected free.  This was a 6 mm vein.  Given that the upper arm graft would be fairly short I elected to do a loop formation using one counterincision to allow more length for access.  Using 1 distal counterincision, a 4-7 mm PTFE graft was tunneled in the upper arm and the patient was heparinized.  The brachial artery was clamped proximally and distally and a longitudinal arteriotomy was made.  A segment of the 4 mm into the graft was excised, the graft spatulated and sewn end-to-side to the artery using continuous 6-0 Prolene suture.  The graft was pulled the appropriate  length for anastomosis to the high brachial vein.  The vein was ligated distally and spatulated proximally.  The graft was sewn into into the vein after it was cut to the appropriate length with continuous 6-0 Prolene suture.  At the completion there was a good thrill in the graft.  There was a radial signal with the Doppler.  Hemostasis was obtained in the wounds.  Each of the wounds was closed with a deep layer of 3-0 Vicryl and the skin closed with 4-0 Vicryl.  Dermabond was applied.  The patient tolerated the procedure well and was transferred to the recovery room in stable condition.  All needle and sponge counts were correct.  [name], MD, FACS [facility]  DATE OF DICTATION:   07/11/2018

## 2018-07-11 NOTE — Interval H&P Note (Signed)
History and Physical Interval Note:  07/11/2018 9:53 AM  Kyle [name]  has presented today for surgery, with the diagnosis of END STAGE RENAL DISEASE FOR HEMODIALYSIS ACCESS.  The various methods of treatment have been discussed with the patient and family. After consideration of risks, benefits and other options for treatment, the patient has consented to  Procedure(s): INSERTION OF ARTERIOVENOUS (AV) GORE-TEX GRAFT LEFT ARM (Left) as a surgical intervention.  [name] history has been reviewed, patient examined, no change in status, stable for surgery.  I have reviewed the patient's chart and labs.  Questions were answered to the patient's satisfaction.     [name]

## 2018-07-12 ENCOUNTER — Encounter (HOSPITAL_COMMUNITY): Payer: Self-pay | Admitting: Vascular Surgery

## 2018-07-19 ENCOUNTER — Encounter: Payer: Self-pay | Admitting: Vascular Surgery

## 2018-07-19 ENCOUNTER — Ambulatory Visit (INDEPENDENT_AMBULATORY_CARE_PROVIDER_SITE_OTHER): Payer: Self-pay | Admitting: Vascular Surgery

## 2018-07-19 ENCOUNTER — Other Ambulatory Visit: Payer: Self-pay

## 2018-07-19 VITALS — BP 140/67 | HR 83 | Temp 97.6°F | Resp 20 | Ht 72.0 in | Wt 171.0 lb

## 2018-07-19 DIAGNOSIS — N186 End stage renal disease: Secondary | ICD-10-CM

## 2018-07-19 DIAGNOSIS — Z992 Dependence on renal dialysis: Secondary | ICD-10-CM

## 2018-07-19 NOTE — Progress Notes (Signed)
   Patient name: Kyle [name] MRN: [id number] DOB: 1955-11-03 Sex: male  REASON FOR VISIT:   Follow-up after new left upper arm AV graft.  HPI:   Kyle [name] is a pleasant 63 y.o. male who presented for new access.  He underwent a new left upper arm 4-7 mm PTFE graft on 07/11/2018.  I was somewhat concerned about steal so he comes in for a follow-up visit.  Current Outpatient Medications  Medication Sig Dispense Refill  . aspirin 81 MG tablet Take 81 mg by mouth daily.    . B Complex-C-Folic Acid (RENA-VITE RX) 1 MG TABS Take 1 tablet by mouth daily.  6  . carvedilol (COREG) 12.5 MG tablet Take 1 tablet (12.5 mg total) by mouth 2 (two) times daily with a meal. 180 tablet 0  . doxercalciferol (HECTOROL) 4 MCG/2ML injection Inject 2.5 mLs (5 mcg total) into the vein every Monday, Wednesday, and Friday with hemodialysis.    Marland Kitchen glimepiride (AMARYL) 2 MG tablet Take 1 tablet (2 mg total) by mouth daily with breakfast. (Patient taking differently: Take 2 mg by mouth daily after lunch. ) 30 tablet 1  . lisinopril (PRINIVIL,ZESTRIL) 20 MG tablet TAKE 1 TABLET BY MOUTH DAILY 30 tablet 0  . oxyCODONE-acetaminophen (PERCOCET/ROXICET) 5-325 MG tablet Take 1 tablet by mouth every 6 (six) hours as needed. (Patient not taking: Reported on 07/19/2018) 12 tablet 0   No current facility-administered medications for this visit.     REVIEW OF SYSTEMS:  [X]  denotes positive finding, [ ]  denotes negative finding Vascular    Leg swelling    Cardiac    Chest pain or chest pressure:    Shortness of breath upon exertion:    Short of breath when lying flat:    Irregular heart rhythm:    Constitutional    Fever or chills:     PHYSICAL EXAM:   Vitals:   07/19/18 1523  BP: 140/67  Pulse: 83  Resp: 20  Temp: 97.6 F (36.4 C)  SpO2: 98%  Weight: 171 lb (77.6 kg)  Height: 6' (1.829 m)    GENERAL: The patient is a well-nourished male, in no acute distress. The vital signs are documented above.  CARDIOVASCULAR: There is a regular rate and rhythm. PULMONARY: There is good air exchange bilaterally without wheezing or rales. VASCULAR: His graft has an excellent thrill. He has a palpable radial pulse on the left. His incisions are healing nicely.  DATA:   No new data.  MEDICAL ISSUES:   S/P NEW LEFT UPPER ARM AV GRAFT: His graft is working well and he has no steal symptoms.  They can begin using his graft on July 20.  I will see him back as needed.  [name] [facility] [phone]

## 2019-10-04 DIAGNOSIS — T782XXA Anaphylactic shock, unspecified, initial encounter: Secondary | ICD-10-CM | POA: Insufficient documentation

## 2019-10-04 DIAGNOSIS — T7840XA Allergy, unspecified, initial encounter: Secondary | ICD-10-CM | POA: Insufficient documentation

## 2019-10-04 HISTORY — DX: Allergy, unspecified, initial encounter: T78.40XA

## 2019-10-04 HISTORY — DX: Anaphylactic shock, unspecified, initial encounter: T78.2XXA

## 2019-12-31 DIAGNOSIS — I509 Heart failure, unspecified: Secondary | ICD-10-CM | POA: Insufficient documentation

## 2019-12-31 DIAGNOSIS — I2510 Atherosclerotic heart disease of native coronary artery without angina pectoris: Secondary | ICD-10-CM | POA: Insufficient documentation

## 2019-12-31 DIAGNOSIS — N186 End stage renal disease: Secondary | ICD-10-CM | POA: Insufficient documentation

## 2019-12-31 DIAGNOSIS — I5082 Biventricular heart failure: Secondary | ICD-10-CM | POA: Insufficient documentation

## 2019-12-31 DIAGNOSIS — R011 Cardiac murmur, unspecified: Secondary | ICD-10-CM | POA: Insufficient documentation

## 2019-12-31 DIAGNOSIS — I10 Essential (primary) hypertension: Secondary | ICD-10-CM | POA: Insufficient documentation

## 2019-12-31 DIAGNOSIS — E119 Type 2 diabetes mellitus without complications: Secondary | ICD-10-CM | POA: Insufficient documentation

## 2020-01-01 ENCOUNTER — Encounter: Payer: Self-pay | Admitting: Cardiology

## 2020-01-01 ENCOUNTER — Other Ambulatory Visit: Payer: Self-pay

## 2020-01-01 ENCOUNTER — Ambulatory Visit: Payer: Medicare (Managed Care) | Admitting: Cardiology

## 2020-01-01 VITALS — BP 154/78 | HR 85 | Ht 72.0 in | Wt 171.1 lb

## 2020-01-01 DIAGNOSIS — N186 End stage renal disease: Secondary | ICD-10-CM | POA: Diagnosis not present

## 2020-01-01 DIAGNOSIS — E1129 Type 2 diabetes mellitus with other diabetic kidney complication: Secondary | ICD-10-CM | POA: Diagnosis not present

## 2020-01-01 DIAGNOSIS — Z992 Dependence on renal dialysis: Secondary | ICD-10-CM

## 2020-01-01 DIAGNOSIS — I10 Essential (primary) hypertension: Secondary | ICD-10-CM | POA: Diagnosis not present

## 2020-01-01 DIAGNOSIS — I2510 Atherosclerotic heart disease of native coronary artery without angina pectoris: Secondary | ICD-10-CM

## 2020-01-01 DIAGNOSIS — E1165 Type 2 diabetes mellitus with hyperglycemia: Secondary | ICD-10-CM

## 2020-01-01 DIAGNOSIS — IMO0002 Reserved for concepts with insufficient information to code with codable children: Secondary | ICD-10-CM

## 2020-01-01 MED ORDER — NITROGLYCERIN 0.4 MG SL SUBL: 0.4000 mg | SUBLINGUAL_TABLET | SUBLINGUAL | 6 refills | Status: AC | PRN

## 2020-01-01 NOTE — Patient Instructions (Addendum)
Medication Instructions:  Your physician has recommended you make the following change in your medication:   Take nitroglycerin as needed for chest pain  *If you need a refill on your cardiac medications before your next appointment, please call your pharmacy*   Lab Work: None ordered If you have labs (blood work) drawn today and your tests are completely normal, you will receive your results only by: Marland Kitchen MyChart Message (if you have MyChart) OR . A paper copy in the mail If you have any lab test that is abnormal or we need to change your treatment, we will call you to review the results.   Testing/Procedures:  Your physician has requested that you have a lexiscan myoview. For further information please visit [email or web address]. Please follow instruction sheet, as given.  The test will take approximately 3 to 4 hours to complete; you may bring reading material.  If someone comes with you to your appointment, they will need to remain in the main lobby due to limited space in the testing area. **If you are pregnant or breastfeeding, please notify the nuclear lab prior to your appointment**  How to prepare for your Myocardial Perfusion Test: . Do not eat or drink 3 hours prior to your test, except you may have water. . Do not consume products containing caffeine (regular or decaffeinated) 12 hours prior to your test. (ex: coffee, chocolate, sodas, tea). . Do bring a list of your current medications with you.  If not listed below, you may take your medications as normal. . Do wear comfortable clothes (no dresses or overalls) and walking shoes, tennis shoes preferred (No heels or open toe shoes are allowed). . Do NOT wear cologne, perfume, aftershave, or lotions (deodorant is allowed). . If these instructions are not followed, your test will have to be rescheduled.    Follow-Up: At [facility], you and your health needs are our priority.  As part of our continuing mission to provide you  with exceptional heart care, we have created designated Provider Care Teams.  These Care Teams include your primary Cardiologist (physician) and Advanced Practice Providers (APPs -  Physician Assistants and Nurse Practitioners) who all work together to provide you with the care you need, when you need it.  We recommend signing up for the patient portal called "MyChart".  Sign up information is provided on this After Visit Summary.  MyChart is used to connect with patients for Virtual Visits (Telemedicine).  Patients are able to view lab/test results, encounter notes, upcoming appointments, etc.  Non-urgent messages can be sent to your provider as well.   To learn more about what you can do with MyChart, go to [email or web address].    Your next appointment:   2 month(s)  The format for your next appointment:   In Person  Provider:   Jyl Heinz, MD   Other Instructions  Cardiac Nuclear Scan  A cardiac nuclear scan is a test that is done to check the flow of blood to your heart. It is done when you are resting and when you are exercising. The test looks for problems such as:  Not enough blood reaching a portion of the heart.  The heart muscle not working as it should. You may need this test if:  You have heart disease.  You have had lab results that are not normal.  You have had heart surgery or a balloon procedure to open up blocked arteries (angioplasty).  You have chest pain.  You have shortness of breath.  In this test, a special dye (tracer) is put into your bloodstream. The tracer will travel to your heart. A camera will then take pictures of your heart to see how the tracer moves through your heart. This test is usually done at a hospital and takes 2-4 hours. Tell a doctor about:  Any allergies you have.  All medicines you are taking, including vitamins, herbs, eye drops, creams, and over-the-counter medicines.  Any problems you or family members have had with  anesthetic medicines.  Any blood disorders you have.  Any surgeries you have had.  Any medical conditions you have.  Whether you are pregnant or may be pregnant. What are the risks? Generally, this is a safe test. However, problems may occur, such as:  Serious chest pain and heart attack. This is only a risk if the stress portion of the test is done.  Rapid heartbeat.  A feeling of warmth in your chest. This feeling usually does not last long.  Allergic reaction to the tracer. What happens before the test?  Ask your doctor about changing or stopping your normal medicines. This is important.  Follow instructions from your doctor about what you cannot eat or drink.  Remove your jewelry on the day of the test. What happens during the test? 1. An IV tube will be inserted into one of your veins. 2. Your doctor will give you a small amount of tracer through the IV tube. 3. You will wait for 20-40 minutes while the tracer moves through your bloodstream. 4. Your heart will be monitored with an electrocardiogram (ECG). 5. You will lie down on an exam table. 6. Pictures of your heart will be taken for about 15-20 minutes. 7. You may also have a stress test. For this test, one of these things may be done: ? You will be asked to exercise on a treadmill or a stationary bike. ? You will be given medicines that will make your heart work harder. This is done if you are unable to exercise. 8. When blood flow to your heart has peaked, a tracer will again be given through the IV tube. 9. After 20-40 minutes, you will get back on the exam table. More pictures will be taken of your heart. 10. Depending on the tracer that is used, more pictures may need to be taken 3-4 hours later. 11. Your IV tube will be removed when the test is over. The test may vary among doctors and hospitals. What happens after the test? 1. Ask your doctor: ? Whether you can return to your normal schedule, including diet,  activities, and medicines. ? Whether you should drink more fluids. This will help to remove the tracer from your body. Drink enough fluid to keep your pee (urine) pale yellow. 2. Ask your doctor, or the department that is doing the test: ? When will my results be ready? ? How will I get my results? Summary  A cardiac nuclear scan is a test that is done to check the flow of blood to your heart.  Tell your doctor whether you are pregnant or may be pregnant.  Before the test, ask your doctor about changing or stopping your normal medicines. This is important.  Ask your doctor whether you can return to your normal activities. You may be asked to drink more fluids. This information is not intended to replace advice given to you by your health care provider. Make sure you discuss any questions you have with your health care provider.  Document Revised: 05/03/2018 Document Reviewed: 06/27/2017 Elsevier Patient Education  [place].  Nitroglycerin sublingual tablets What is this medicine? NITROGLYCERIN (nye troe GLI ser in) is a type of vasodilator. It relaxes blood vessels, increasing the blood and oxygen supply to your heart. This medicine is used to relieve chest pain caused by angina. It is also used to prevent chest pain before activities like climbing stairs, going outdoors in cold weather, or sexual activity. This medicine may be used for other purposes; ask your health care provider or pharmacist if you have questions. COMMON BRAND NAME(S): Nitroquick, Nitrostat, Nitrotab What should I tell my health care provider before I take this medicine? They need to know if you have any of these conditions:  anemia  head injury, recent stroke, or bleeding in the brain  liver disease  previous heart attack  an unusual or allergic reaction to nitroglycerin, other medicines, foods, dyes, or preservatives  pregnant or trying to get pregnant  breast-feeding How should I use this  medicine? Take this medicine by mouth as needed. At the first sign of an angina attack (chest pain or tightness) place one tablet under your tongue. You can also take this medicine 5 to 10 minutes before an event likely to produce chest pain. Follow the directions on the prescription label. Let the tablet dissolve under the tongue. Do not swallow whole. Replace the dose if you accidentally swallow it. It will help if your mouth is not dry. Saliva around the tablet will help it to dissolve more quickly. Do not eat or drink, smoke or chew tobacco while a tablet is dissolving. If you are not better within 5 minutes after taking ONE dose of nitroglycerin, call 9-1-1 immediately to seek emergency medical care. Do not take more than 3 nitroglycerin tablets over 15 minutes. If you take this medicine often to relieve symptoms of angina, your doctor or health care professional may provide you with different instructions to manage your symptoms. If symptoms do not go away after following these instructions, it is important to call 9-1-1 immediately. Do not take more than 3 nitroglycerin tablets over 15 minutes. Talk to your pediatrician regarding the use of this medicine in children. Special care may be needed. Overdosage: If you think you have taken too much of this medicine contact a poison control center or emergency room at once. NOTE: This medicine is only for you. Do not share this medicine with others. What if I miss a dose? This does not apply. This medicine is only used as needed. What may interact with this medicine? Do not take this medicine with any of the following medications:  certain migraine medicines like ergotamine and dihydroergotamine (DHE)  medicines used to treat erectile dysfunction like sildenafil, tadalafil, and vardenafil  riociguat This medicine may also interact with the following medications:  alteplase  aspirin  heparin  medicines for high blood pressure  medicines for  mental depression  other medicines used to treat angina  phenothiazines like chlorpromazine, mesoridazine, prochlorperazine, thioridazine This list may not describe all possible interactions. Give your health care provider a list of all the medicines, herbs, non-prescription drugs, or dietary supplements you use. Also tell them if you smoke, drink alcohol, or use illegal drugs. Some items may interact with your medicine. What should I watch for while using this medicine? Tell your doctor or health care professional if you feel your medicine is no longer working. Keep this medicine with you at all times. Sit or lie down  when you take your medicine to prevent falling if you feel dizzy or faint after using it. Try to remain calm. This will help you to feel better faster. If you feel dizzy, take several deep breaths and lie down with your feet propped up, or bend forward with your head resting between your knees. You may get drowsy or dizzy. Do not drive, use machinery, or do anything that needs mental alertness until you know how this drug affects you. Do not stand or sit up quickly, especially if you are an older patient. This reduces the risk of dizzy or fainting spells. Alcohol can make you more drowsy and dizzy. Avoid alcoholic drinks. Do not treat yourself for coughs, colds, or pain while you are taking this medicine without asking your doctor or health care professional for advice. Some ingredients may increase your blood pressure. What side effects may I notice from receiving this medicine? Side effects that you should report to your doctor or health care professional as soon as possible:  blurred vision  dry mouth  skin rash  sweating  the feeling of extreme pressure in the head  unusually weak or tired Side effects that usually do not require medical attention (report to your doctor or health care professional if they continue or are bothersome):  flushing of the face or  neck  headache  irregular heartbeat, palpitations  nausea, vomiting This list may not describe all possible side effects. Call your doctor for medical advice about side effects. You may report side effects to FDA at 1-800-FDA-1088. Where should I keep my medicine? Keep out of the reach of children. Store at room temperature between 20 and 25 degrees C (68 and 77 degrees F). Store in [facility] [employer]. Protect from light and moisture. Keep tightly closed. Throw away any unused medicine after the expiration date. NOTE: This sheet is a summary. It may not cover all possible information. If you have questions about this medicine, talk to your doctor, pharmacist, or health care provider.  2020 Elsevier/Gold Standard (2012-11-09 17:57:36)

## 2020-01-01 NOTE — Progress Notes (Signed)
Cardiology Office Note:    Date:  01/01/2020   ID:  Kyle [name], DOB August 07, 1955, MRN [id number]  PCP:  Patient, No Pcp Per  Cardiologist:  Jenean Lindau, MD   Referring MD: Corliss Parish, MD    ASSESSMENT:    1. Benign essential HTN   2. Coronary artery disease involving native coronary artery of native heart without angina pectoris   3. Essential (primary) hypertension   4. DM (diabetes mellitus), type 2, uncontrolled, with renal complications ([place])   5. Type 2 diabetes mellitus with other diabetic kidney complication ([place])   6. ESRD (end stage renal disease) on dialysis [facility])    PLAN:    In order of problems listed above:  1. Coronary artery disease: Secondary prevention stressed with the patient.  Importance of compliance with diet medication stressed and he vocalized understanding.  He was advised to continue walking half an hour a day on a daily basis. 2. Cardiomyopathy: Ejection fraction 30 to 35% on recent echocardiogram.  Pulmonary hypertension noted.  This can be complex and cause an issue with his transplant surgery.  I will also do a Lexiscan sestamibi to assess for further progression of coronary artery disease.  Coronary angiography report from 2018 was reviewed.  Details are mentioned below. 3. Essential hypertension: Blood pressure stable and diet was mentioned. 4. End-stage renal disease on dialysis: Managed by nephrology. 5. Mixed dyslipidemia: He is not on statin therapy and I am not clear why do so.  I will give him a prescription to get blood work which is Chem-7 magnesium level lipid panel and fax to me.  I will review this and act accordingly. 6. Patient will be seen in follow-up appointment in 2 months or earlier if the patient has any concerns    Medication Adjustments/Labs and Tests Ordered: Current medicines are reviewed at length with the patient today.  Concerns regarding medicines are outlined above.  No orders of the defined types were  placed in this encounter.  No orders of the defined types were placed in this encounter.    No chief complaint on file.    History of Present Illness:    Kyle [name] is a 64 y.o. male.  Patient has past medical history of coronary artery disease, essential hypertension, end-stage renal disease on dialysis and diabetes mellitus.  He denies any problems at this time and takes care of activities of daily living.  No chest pain orthopnea or PND.  At the time of my evaluation, the patient is alert awake oriented and in no distress.  His wife accompanies him for this visit..  Patient mentions to me that he walks about 25 to 30 minutes without any problems.  He occasionally gets some shortness of breath.  He is planning to undergo renal transplant and therefore he was referred here.  He was admitted to [facility] with congestive heart failure.  Echocardiogram revealed moderately depressed ejection fraction with significant elevated and pulmonary pressure.  Moderate to severe pulmonary hypertension.  Past Medical History:  Diagnosis Date  . Abnormal nuclear stress test 09/23/2016  . Acute on chronic combined systolic and diastolic CHF, NYHA class 2 ([place]) 01/15/2014  . Acute respiratory failure with hypoxia ([place])   . Aftercare including intermittent dialysis ([place]) 07/31/2016  . Allergy, unspecified, initial encounter 10/04/2019  . Anaphylactic shock, unspecified, initial encounter 10/04/2019  . Anasarca 08/20/2014  . Anemia in chronic kidney disease 08/20/2014  . Arteriovenous graft infection ([place]) 08/29/2015  .  Ascites 05/11/2016  . Bacteremia   . Benign essential HTN 05/11/2016   Formatting of this note might be different from the original. Last Assessment & Plan:  Blood pressure is running low on dialysis days. He is feeling weakAnd systolic ranges in the 33H. Decrease Coreg to 12.5 mg twice a day and follow.  Marland Kitchen CAD (coronary artery disease)   . Cardiac arrest ([place])    07/2014-[place]  initiated  . Cardiorenal syndrome with renal failure   . CHF (congestive heart failure) ([place])   . Chronic systolic CHF (congestive heart failure) ([place]) 09/23/2016  . Coronary artery disease involving native coronary artery of native heart without angina pectoris 05/11/2016   Formatting of this note might be different from the original. Last Assessment & Plan:  Continue aspirin and statin.  . Diabetes mellitus without complication (HCC)    Type 2  . DM (diabetes mellitus), type 2, uncontrolled, with renal complications ([place]) 5/45/6256  . Embolism due to vascular prosthetic devices, implants and grafts, initial encounter ([place]) 07/31/2016  . Encounter for immunization 07/31/2016  . Encounter for removal of sutures 11/08/2017  . Encounter for screening for respiratory tuberculosis 07/31/2016  . ESRD (end stage renal disease) ([place])    [place] M/W/F  . ESRD (end stage renal disease) on dialysis ([place]) 04/22/2016   Formatting of this note might be different from the original. Last Assessment & Plan:  Management per nephrology.  . Essential (primary) hypertension 07/31/2016  . Heart murmur    as a child  . Hyperlipidemia 01/01/2015   Formatting of this note might be different from the original. Last Assessment & Plan:  Continue statin.  Marland Kitchen Hypertension   . Infection and inflammatory reaction due to cardiac device, implant, and graft ([place]) 10/06/2015   Due to [place] (dialysis access graft)  . Iron deficiency anemia, unspecified 07/31/2016  . NSVT (nonsustained ventricular tachycardia) ([place]) 09/02/2014  . Pressure ulcer 08/22/2014  . Secondary cardiomyopathy ([place])   . Secondary hyperparathyroidism of renal origin ([place]) 07/31/2016  . Staphylococcus aureus bacteremia 09/16/15  . Sudden death history (living patient) 04/11/2015  . Thyroid nodule 01/01/2015   Formatting of this note might be different from the original. Last Assessment & Plan:  Patient has a thyroid nodule which needs biopsy. I have instructed him to  follow-up with primary care for this issue. I have also instructed him to contact me if there is any delays and I would arrange further evaluation.  . Troponin level elevated 08/20/2014  . Type 2 diabetes mellitus with other diabetic kidney complication ([place]) 04/02/9371  . Type 2 diabetes mellitus without complication ([place]) 05/23/7679  . Unspecified protein-calorie malnutrition ([place]) 06/19/2018    Past Surgical History:  Procedure Laterality Date  . AV FISTULA PLACEMENT Left 09/11/2014   Procedure: INSERTION OF LEFT ARM 6MM ARTERIOVENOUS GORTEX GRAFT ;  Surgeon: Elam Dutch, MD;  Location: [place];  Service: Vascular;  Laterality: Left;  . AV FISTULA PLACEMENT Right 10/14/2015   Procedure: ARTERIOVENOUS (AV) FISTULA CREATION;  Surgeon: Angelia Mould, MD;  Location: [place];  Service: Vascular;  Laterality: Right;  . AV FISTULA PLACEMENT Left 07/11/2018   Procedure: INSERTION OF ARTERIOVENOUS (AV) GORE-TEX STRETCH VASCULAR GRAFT LEFT ARM;  Surgeon: Angelia Mould, MD;  Location: [place];  Service: Vascular;  Laterality: Left;  . CARDIAC CATHETERIZATION N/A 09/04/2014   Procedure: Right/Left Heart Cath and Coronary Angiography;  Surgeon: Peter M Martinique, MD;  Location: [place] CV LAB;  Service: Cardiovascular;  Laterality: N/A;  . EXTERNAL EAR SURGERY    . FINGER SURGERY     for infection  . INSERTION OF DIALYSIS CATHETER N/A 08/21/2014   Procedure: INSERTION OF DIALYSIS CATHETER RIGHT INTERNAL JUGULAR VEIN;  Surgeon: Conrad Coram, MD;  Location: [place];  Service: Vascular;  Laterality: N/A;  MAC to General  . INSERTION OF DIALYSIS CATHETER N/A 09/08/2015   Procedure: INSERTION OF DIALYSIS CATHETER RIGHT INTERNAL JUGULAR;  Surgeon: Rosetta Posner, MD;  Location[facility];  Service: Vascular;  Laterality: N/A;  . LEFT HEART CATH AND CORONARY ANGIOGRAPHY N/A 09/23/2016   Procedure: LEFT HEART CATH AND CORONARY ANGIOGRAPHY;  Surgeon: Martinique, Peter M, MD;  Location: [place] CV LAB;  Service:  Cardiovascular;  Laterality: N/A;  . PATCH ANGIOPLASTY Right 12/04/2015   Procedure: WITH 1 X 6 CM [place] ANGIOPLASTY TO RIGHT ARM BRACHIO-CEPHALIC FISTULA;  Surgeon: Conrad [place], MD;  Location: [place];  Service: Vascular;  Laterality: Right;  . PERIPHERAL VASCULAR CATHETERIZATION N/A 12/04/2015   Procedure: Fistulagram - Right Arm;  Surgeon: Conrad [place], MD;  Location: [place] [facility];  Service: Cardiovascular;  Laterality: N/A;  . REVISION OF ARTERIOVENOUS GORETEX GRAFT Left 08/31/2015   Procedure: REVISION OF LEFT ARTERIOVENOUS GORETEX GRAFT USING  AN INTERPOSITION  4-7MM GORTEX GRAFT WITH REMOVAL OF INFECTED STENT;  INTRAOPERATIVE ARTERIOGRAM TIMES ONE.;  Surgeon: Angelia Mould, MD;  Location: [place];  Service: Vascular;  Laterality: Left;  . REVISON OF ARTERIOVENOUS FISTULA Right 12/04/2015   Procedure: REVISON OF RIGHT BRACHIO--CEPHALIC  ARTERIOVENOUS FISTULA;  Surgeon: Conrad [place], MD;  Location: [place];  Service: Vascular;  Laterality: Right;  . TONSILLECTOMY      Current Medications: Current Meds  Medication Sig  . aspirin 81 MG tablet Take 81 mg by mouth daily.  . B Complex-C-Folic Acid (RENA-VITE RX) 1 MG TABS Take 1 tablet by mouth daily.  . calcium acetate (PHOSLO) 667 MG capsule Take 667 mg by mouth 3 (three) times daily.  . carvedilol (COREG) 12.5 MG tablet Take 1 tablet (12.5 mg total) by mouth 2 (two) times daily with a meal.  . glimepiride (AMARYL) 2 MG tablet Take 1 tablet (2 mg total) by mouth daily with breakfast.  . lisinopril (PRINIVIL,ZESTRIL) 20 MG tablet TAKE 1 TABLET BY MOUTH DAILY     Allergies:   No known allergies   Social History   Socioeconomic History  . Marital status: Married    Spouse name: Not on file  . Number of children: Not on file  . Years of education: Not on file  . Highest education level: Not on file  Occupational History  . Not on file  Tobacco Use  . Smoking status: Former Smoker    Types: Cigarettes    Quit  date: 06/1982    Years since quitting: 37.5  . Smokeless tobacco: Never Used  Vaping Use  . Vaping Use: Never used  Substance and Sexual Activity  . Alcohol use: No  . Drug use: No  . Sexual activity: Not on file  Other Topics Concern  . Not on file  Social History Narrative  . Not on file   Social Determinants of Health   Financial Resource Strain:   . Difficulty of Paying Living Expenses: Not on file  Food Insecurity:   . Worried About Charity fundraiser in the Last Year: Not on file  . Ran Out of Food in the Last Year: Not on file  Transportation Needs:   . Film/video editor (Medical): Not on file  . Lack of Transportation (Non-Medical): Not on file  Physical Activity:   . Days of Exercise per Week: Not on file  . Minutes of Exercise per Session: Not on file  Stress:   . Feeling of Stress : Not on file  Social Connections:   . Frequency of Communication with Friends and Family: Not on file  . Frequency of Social Gatherings with Friends and Family: Not on file  . Attends Religious Services: Not on file  . Active Member of Clubs or Organizations: Not on file  . Attends Archivist Meetings: Not on file  . Marital Status: Not on file     Family History: [name] family history includes Diabetes in his father and mother; Heart failure in his mother; Hypertension in his mother.  ROS:   Please see the history of present illness.    All other systems reviewed and are negative.  EKGs/Labs/Other Studies Reviewed:    The following studies were reviewed today: EKG reveals sinus rhythm and nonspecific ST-T changes.  QT prolongation.   Martinique, Peter M, MD (Primary)    Procedures  LEFT HEART CATH AND CORONARY ANGIOGRAPHY  Conclusion    Ost LAD to Mid LAD lesion, 25 %stenosed.  1st Mrg lesion, 20 %stenosed.  Prox Cx to Mid Cx lesion, 20 %stenosed.  Prox LAD lesion, 35 %stenosed.  Prox RCA lesion, 70 %stenosed.  The left ventricular systolic  function is normal.  LV end diastolic pressure is normal.  The left ventricular ejection fraction is 55-65% by visual estimate.   1. Single vessel CAD. There is a 70% stenosis in a small nondominant RCA.  2. Normal LV function.  3. Normal LVEDP  Plan: recommend continued medical therapy. The RCA is too small to consider PCI.    Transthoracic Echocardiogram Report  Name: Kyle [name], Kyle [name]              Study Date:  12/11/2019    Height: 72 in  MRN: [id number]                              Weight: 177 lb  DOB: 03-04-1955                    Gender: Male    BSA: 2.0 m2  Age: 65 yrs                               BP: 132/63 mmHg  Reason For Study: HX of CAD, pretransplant eval;Pre-transplant  evaluation for  kidney transplant                            HR: 76  Ordering Physician: Carlyle Dolly       Performed By:  Jaclyn Shaggy   Referring Physician: Carlyle Dolly [place]  The left ventricular size is normal. Mild left ventricular  hypertrophy. Left ventricular systolic function is moderately reduced.  LV ejection fraction = 30-35%. There is moderate to severe global  hypokinesis of the left ventricle.  The right ventricle is moderate to severely dilated. The right  ventricular systolic function is borderline reduced. There is mild  right ventricular hypertrophy.  The left atrium is moderately dilated.  The  right atrium is severely dilated. Atrial septum bows to the left.   There is mild aortic regurgitation.  There is mild to moderate mitral regurgitation.  There is severe tricuspid regurgitation.  IVC size was severely dilated. Systolic flow reversal of the IVC  spectral Doppler flow pattern.   There is trivial pericardial effusion.  There is a pleural effusion present.  Moderate to severe pulmonary  hypertension. Estimated right ventricular  systolic pressure is 65 mmHg.   Difficult to compare with the prior study due to different image  quality. But per report EF has declined substantially along with  worsening pulmonary hypertension. Recommend cardiology or heart  failure consult. Referring team paged.       Recent Labs: No results found for requested labs within last 8760 hours.  Recent Lipid Panel    Component Value Date/Time   CHOL 158 09/07/2014 0551   TRIG 116 09/07/2014 0551   HDL 46 09/07/2014 0551   CHOLHDL 3.4 09/07/2014 0551   VLDL 23 09/07/2014 0551   LDLCALC 89 09/07/2014 0551    Physical Exam:    VS:  BP (!) 154/78   Pulse 85   Ht 6' (1.829 m)   Wt 171 lb 1.3 oz (77.6 kg)   SpO2 98%   BMI 23.20 kg/m     Wt Readings from Last 3 Encounters:  01/01/20 171 lb 1.3 oz (77.6 kg)  07/19/18 171 lb (77.6 kg)  07/11/18 170 lb (77.1 kg)     GEN: Patient is in no acute distress HEENT: Normal NECK: No JVD; No carotid bruits LYMPHATICS: No lymphadenopathy CARDIAC: Hear sounds regular, 2/6 systolic murmur at the apex. RESPIRATORY:  Clear to auscultation without rales, wheezing or rhonchi  ABDOMEN: Soft, non-tender, non-distended MUSCULOSKELETAL:  No edema; No deformity  SKIN: Warm and dry NEUROLOGIC:  Alert and oriented x 3 PSYCHIATRIC:  Normal affect   Signed, Jenean Lindau, MD  01/01/2020 3:12 PM    [facility]

## 2020-01-08 ENCOUNTER — Ambulatory Visit: Payer: Medicare (Managed Care) | Attending: Internal Medicine

## 2020-01-08 ENCOUNTER — Other Ambulatory Visit (HOSPITAL_BASED_OUTPATIENT_CLINIC_OR_DEPARTMENT_OTHER): Payer: Self-pay | Admitting: Internal Medicine

## 2020-01-08 DIAGNOSIS — Z23 Encounter for immunization: Secondary | ICD-10-CM

## 2020-01-08 NOTE — Progress Notes (Signed)
   Covid-19 Vaccination Clinic  Name:  Kyle [name]    MRN: [id number] DOB: Dec 20, 1955  01/08/2020  Kyle [name] was observed post Covid-19 immunization for 15 minutes without incident. He was provided with Vaccine Information Sheet and instruction to access the V-Safe system.   Kyle [name] was instructed to call 911 with any severe reactions post vaccine: Marland Kitchen Difficulty breathing  . Swelling of face and throat  . A fast heartbeat  . A bad rash all over body  . Dizziness and weakness   Immunizations Administered    Name Date Dose VIS Date Route   Pfizer COVID-19 Vaccine 01/08/2020 10:10 AM 0.3 mL 11/14/2019 Intramuscular   Manufacturer: [place]   Lot: [id number]   [place]: [id number]

## 2020-01-08 NOTE — Addendum Note (Signed)
Addended by: Truddie Hidden on: 01/08/2020 09:25 AM   Modules accepted: Orders

## 2020-01-09 ENCOUNTER — Telehealth: Payer: Self-pay

## 2020-01-09 NOTE — Telephone Encounter (Signed)
Detailed instructions left on the patient's answering machine. Asked to call back with any questions. S.Gwenevere Goga EMTP 

## 2020-01-11 NOTE — Addendum Note (Signed)
Addended by: Jyl Heinz R on: 01/11/2020 01:55 PM   Modules accepted: Orders

## 2020-01-14 MED FILL — PFIZER-BIONTECH COVID-19 VA: 30 | 1 days supply | Qty: 0 | Fill #0

## 2020-01-15 ENCOUNTER — Other Ambulatory Visit: Payer: Self-pay

## 2020-01-15 ENCOUNTER — Ambulatory Visit (HOSPITAL_COMMUNITY): Payer: Medicare (Managed Care) | Attending: Cardiology

## 2020-01-15 DIAGNOSIS — E1165 Type 2 diabetes mellitus with hyperglycemia: Secondary | ICD-10-CM

## 2020-01-15 DIAGNOSIS — Z992 Dependence on renal dialysis: Secondary | ICD-10-CM

## 2020-01-15 DIAGNOSIS — I2510 Atherosclerotic heart disease of native coronary artery without angina pectoris: Secondary | ICD-10-CM

## 2020-01-15 DIAGNOSIS — E1129 Type 2 diabetes mellitus with other diabetic kidney complication: Secondary | ICD-10-CM | POA: Insufficient documentation

## 2020-01-15 DIAGNOSIS — IMO0002 Reserved for concepts with insufficient information to code with codable children: Secondary | ICD-10-CM

## 2020-01-15 DIAGNOSIS — N186 End stage renal disease: Secondary | ICD-10-CM | POA: Diagnosis not present

## 2020-01-15 LAB — MYOCARDIAL PERFUSION IMAGING
LV dias vol: 191 mL (ref 62–150)
LV sys vol: 143 mL
Peak HR: 88 {beats}/min
Rest HR: 75 {beats}/min
SDS: 1
SRS: 6
SSS: 7
TID: 1.02

## 2020-01-15 MED ORDER — TECHNETIUM TC 99M TETROFOSMIN IV KIT
31.8000 | PACK | Freq: Once | INTRAVENOUS | Status: AC | PRN
  Administered 2020-01-15: 31.8 via INTRAVENOUS
  Filled 2020-01-15: qty 32

## 2020-01-15 MED ORDER — REGADENOSON 0.4 MG/5ML IV SOLN
0.4000 mg | Freq: Once | INTRAVENOUS | Status: AC
  Administered 2020-01-15: 0.4 mg via INTRAVENOUS

## 2020-01-15 MED ORDER — TECHNETIUM TC 99M TETROFOSMIN IV KIT
9.6000 | PACK | Freq: Once | INTRAVENOUS | Status: AC | PRN
  Administered 2020-01-15: 9.6 via INTRAVENOUS
  Filled 2020-01-15: qty 10

## 2020-01-21 ENCOUNTER — Other Ambulatory Visit: Payer: Self-pay

## 2020-01-21 DIAGNOSIS — E7800 Pure hypercholesterolemia, unspecified: Secondary | ICD-10-CM

## 2020-01-21 HISTORY — DX: Pure hypercholesterolemia, unspecified: E78.00

## 2020-01-22 ENCOUNTER — Encounter: Payer: Self-pay | Admitting: Cardiology

## 2020-01-22 ENCOUNTER — Telehealth (INDEPENDENT_AMBULATORY_CARE_PROVIDER_SITE_OTHER): Payer: Medicare (Managed Care) | Admitting: Cardiology

## 2020-01-22 ENCOUNTER — Other Ambulatory Visit: Payer: Self-pay

## 2020-01-22 VITALS — Ht 72.0 in | Wt 172.0 lb

## 2020-01-22 DIAGNOSIS — I5043 Acute on chronic combined systolic (congestive) and diastolic (congestive) heart failure: Secondary | ICD-10-CM | POA: Diagnosis not present

## 2020-01-22 NOTE — Patient Instructions (Signed)

## 2020-01-22 NOTE — Progress Notes (Signed)
Virtual Visit via Telephone Note   This visit type was conducted due to national recommendations for restrictions regarding the COVID-19 Pandemic (e.g. social distancing) in an effort to limit this patient's exposure and mitigate transmission in our community.  Due to his co-morbid illnesses, this patient is at least at moderate risk for complications without adequate follow up.  This format is felt to be most appropriate for this patient at this time.  The patient did not have access to video technology/had technical difficulties with video requiring transitioning to audio format only (telephone).  All issues noted in this document were discussed and addressed.  No physical exam could be performed with this format.  Please refer to the patient's chart for his  consent to telehealth for [facility].    Date:  01/22/2020   ID:  Kyle [name], DOB Aug 14, 1955, MRN [id number] The patient was identified using 2 identifiers.  Patient Location: Home Provider Location: Office/Clinic  PCP:  Patient, No Pcp Per  Cardiologist:  No primary care provider on file.  Electrophysiologist:  None   Evaluation Performed:  Follow-Up Visit  Chief Complaint: Follow-up for assessment for cardiomyopathy and abnormal stress test  History of Present Illness:    Kyle [name] is a 64 y.o. male with past medical history of coronary artery disease.  He underwent coronary angiography in 2018 and the results are mentioned below.  He mentions to me that he takes care of activities of daily living.  He walks on a regular basis.  He is on dialysis.  He was evaluated for putting him back on transplant list.  His ejection fraction is moderately depressed.  At the time of my evaluation by phone he appears to be in no distress and comfortable talking to me on the phone.  The patient does not have symptoms concerning for COVID-19 infection (fever, chills, cough, or new shortness of breath).    Past Medical History:   Diagnosis Date  . Abnormal nuclear stress test 09/23/2016  . Acute on chronic combined systolic and diastolic CHF, NYHA class 2 ([place]) 01/15/2014  . Acute respiratory failure with hypoxia ([place])   . Aftercare including intermittent dialysis ([place]) 07/31/2016  . Allergy, unspecified, initial encounter 10/04/2019  . Anaphylactic shock, unspecified, initial encounter 10/04/2019  . Anasarca 08/20/2014  . Anemia in chronic kidney disease 08/20/2014  . Arteriovenous graft infection ([place]) 08/29/2015  . Ascites 05/11/2016  . Bacteremia   . Benign essential HTN 05/11/2016   Formatting of this note might be different from the original. Last Assessment & Plan:  Blood pressure is running low on dialysis days. He is feeling weakAnd systolic ranges in the 36R. Decrease Coreg to 12.5 mg twice a day and follow.  Marland Kitchen CAD (coronary artery disease)   . Cardiac arrest ([place])    07/2014-[place] initiated  . Cardiorenal syndrome with renal failure   . CHF (congestive heart failure) ([place])   . Chronic systolic CHF (congestive heart failure) ([place]) 09/23/2016  . Coronary artery disease involving native coronary artery of native heart without angina pectoris 05/11/2016   Formatting of this note might be different from the original. Last Assessment & Plan:  Continue aspirin and statin.  . Diabetes mellitus without complication (HCC)    Type 2  . DM (diabetes mellitus), type 2, uncontrolled, with renal complications ([place]) 4/43/1540  . Embolism due to vascular prosthetic devices, implants and grafts, initial encounter ([place]) 07/31/2016  . Encounter for immunization 07/31/2016  . Encounter for removal  of sutures 11/08/2017  . Encounter for screening for respiratory tuberculosis 07/31/2016  . ESRD (end stage renal disease) ([place])    [place] M/W/F  . ESRD (end stage renal disease) on dialysis ([place]) 04/22/2016   Formatting of this note might be different from the original. Last Assessment & Plan:  Management per nephrology.  . Essential  (primary) hypertension 07/31/2016  . Heart murmur    as a child  . Hyperlipidemia 01/01/2015   Formatting of this note might be different from the original. Last Assessment & Plan:  Continue statin.  Marland Kitchen Hypertension   . Infection and inflammatory reaction due to cardiac device, implant, and graft ([place]) 10/06/2015   Due to [place] (dialysis access graft)  . Iron deficiency anemia, unspecified 07/31/2016  . NSVT (nonsustained ventricular tachycardia) ([place]) 09/02/2014  . Pressure ulcer 08/22/2014  . Secondary cardiomyopathy ([place])   . Secondary hyperparathyroidism of renal origin ([place]) 07/31/2016  . Staphylococcus aureus bacteremia September 23, 2015  . Sudden death history (living patient) 04/11/2015  . Thyroid nodule 01/01/2015   Formatting of this note might be different from the original. Last Assessment & Plan:  Patient has a thyroid nodule which needs biopsy. I have instructed him to follow-up with primary care for this issue. I have also instructed him to contact me if there is any delays and I would arrange further evaluation.  . Troponin level elevated 08/20/2014  . Type 2 diabetes mellitus with other diabetic kidney complication ([place]) 8/0/2233  . Type 2 diabetes mellitus without complication ([place]) 07/07/2447  . Unspecified protein-calorie malnutrition ([place]) 06/19/2018   Past Surgical History:  Procedure Laterality Date  . AV FISTULA PLACEMENT Left 09/11/2014   Procedure: INSERTION OF LEFT ARM 6MM ARTERIOVENOUS GORTEX GRAFT ;  Surgeon: Elam Dutch, MD;  Location: [place];  Service: Vascular;  Laterality: Left;  . AV FISTULA PLACEMENT Right 10/14/2015   Procedure: ARTERIOVENOUS (AV) FISTULA CREATION;  Surgeon: Angelia Mould, MD;  Location: [place];  Service: Vascular;  Laterality: Right;  . AV FISTULA PLACEMENT Left 07/11/2018   Procedure: INSERTION OF ARTERIOVENOUS (AV) GORE-TEX STRETCH VASCULAR GRAFT LEFT ARM;  Surgeon: Angelia Mould, MD;  Location: [place];  Service: Vascular;  Laterality: Left;   . CARDIAC CATHETERIZATION N/A 09/04/2014   Procedure: Right/Left Heart Cath and Coronary Angiography;  Surgeon: Peter M Martinique, MD;  Location: [place] [facility];  Service: Cardiovascular;  Laterality: N/A;  . EXTERNAL EAR SURGERY    . FINGER SURGERY     for infection  . INSERTION OF DIALYSIS CATHETER N/A 08/21/2014   Procedure: INSERTION OF DIALYSIS CATHETER RIGHT INTERNAL JUGULAR VEIN;  Surgeon: Conrad Altona, MD;  Location: [place];  Service: Vascular;  Laterality: N/A;  MAC to General  . INSERTION OF DIALYSIS CATHETER N/A 09/08/2015   Procedure: INSERTION OF DIALYSIS CATHETER RIGHT INTERNAL JUGULAR;  Surgeon: Rosetta Posner, MD;  [facility];  Service: Vascular;  Laterality: N/A;  . LEFT HEART CATH AND CORONARY ANGIOGRAPHY N/A 09/23/2016   Procedure: LEFT HEART CATH AND CORONARY ANGIOGRAPHY;  Surgeon: Martinique, Peter M, MD;  Location: [place] [facility];  Service: Cardiovascular;  Laterality: N/A;  . PATCH ANGIOPLASTY Right 12/04/2015   Procedure: WITH 1 X 6 CM [place] ANGIOPLASTY TO RIGHT ARM BRACHIO-CEPHALIC FISTULA;  Surgeon: Conrad Louisa, MD;  Location: [place];  Service: Vascular;  Laterality: Right;  . PERIPHERAL VASCULAR CATHETERIZATION N/A 12/04/2015   Procedure: Fistulagram - Right Arm;  Surgeon: Conrad [place]els[place], MD;  Location: [place] [facility];  Service: Cardiovascular;  Laterality: N/A;  . REVISION OF ARTERIOVENOUS GORETEX GRAFT Left 08/31/2015   Procedure: REVISION OF LEFT ARTERIOVENOUS GORETEX GRAFT USING  AN INTERPOSITION  4-7MM GORTEX GRAFT WITH REMOVAL OF INFECTED STENT;  INTRAOPERATIVE ARTERIOGRAM TIMES ONE.;  Surgeon: Angelia Mould, MD;  Location: [place];  Service: Vascular;  Laterality: Left;  . REVISON OF ARTERIOVENOUS FISTULA Right 12/04/2015   Procedure: REVISON OF RIGHT BRACHIO--CEPHALIC  ARTERIOVENOUS FISTULA;  Surgeon: Conrad Oakdale, MD;  Location: [place];  Service: Vascular;  Laterality: Right;  . TONSILLECTOMY       Current Meds  Medication Sig  .  aspirin 81 MG tablet Take 81 mg by mouth daily.  . B Complex-C-Folic Acid (RENA-VITE RX) 1 MG TABS Take 1 tablet by mouth daily.  . calcium acetate (PHOSLO) 667 MG capsule Take 667 mg by mouth 3 (three) times daily.  . carvedilol (COREG) 12.5 MG tablet Take 1 tablet (12.5 mg total) by mouth 2 (two) times daily with a meal.  . glimepiride (AMARYL) 2 MG tablet Take 1 tablet (2 mg total) by mouth daily with breakfast.  . lisinopril (PRINIVIL,ZESTRIL) 20 MG tablet TAKE 1 TABLET BY MOUTH DAILY  . nitroGLYCERIN (NITROSTAT) 0.4 MG SL tablet Place 1 tablet (0.4 mg total) under the tongue every 5 (five) minutes as needed.  . rosuvastatin (CRESTOR) 5 MG tablet Take 5 mg by mouth daily.  . [DISCONTINUED] rosuvastatin (CRESTOR) 5 MG tablet Take 5 mg by mouth daily.     Allergies:   No known allergies   Social History   Tobacco Use  . Smoking status: Former Smoker    Types: Cigarettes    Quit date: 06/1982    Years since quitting: 37.6  . Smokeless tobacco: Never Used  Vaping Use  . Vaping Use: Never used  Substance Use Topics  . Alcohol use: No  . Drug use: No     Family Hx: [name] family history includes Diabetes in his father and mother; Heart failure in his mother; Hypertension in his mother.  ROS:   Please see the history of present illness.    As mentioned above All other systems reviewed and are negative.   Prior CV studies:   The following studies were reviewed today:  Coronary angiography was done on 09/23/2016.  LEFT HEART CATH AND CORONARY ANGIOGRAPHY    Conclusion    Ost LAD to Mid LAD lesion, 25 %stenosed.  1st Mrg lesion, 20 %stenosed.  Prox Cx to Mid Cx lesion, 20 %stenosed.  Prox LAD lesion, 35 %stenosed.  Prox RCA lesion, 70 %stenosed.  The left ventricular systolic function is normal.  LV end diastolic pressure is normal.  The left ventricular ejection fraction is 55-65% by visual estimate.   1. Single vessel CAD. There is a 70% stenosis in  a small nondominant RCA.  2. Normal LV function.  3. Normal LVEDP  Plan: recommend continued medical therapy. The RCA is too small to consider PCI.     Study Highlights Lexiscan sestamibi on 01/15/2020    The left ventricular ejection fraction is severely decreased (<30%).  Nuclear stress EF: 25%.  There was no ST segment deviation noted during stress.  There is a small, non-reversible defect of moderate severity present in the basal anterior and mid anterior location.  There is a large, mostly fixed defect of moderate severity present in the basal inferior, basal inferolateral, mid inferior, mid inferolateral and apical inferior location.  Findings  consistent with prior myocardial infarction with very minimal peri-infact ischemia in the inferior LV segments.  This is a high risk study.   Gwyndolyn Kaufman, MD    Labs/Other Tests and Data Reviewed:    EKG:  EKG from previous visit was reviewed.  Recent Labs: No results found for requested labs within last 8760 hours.   Recent Lipid Panel Lab Results  Component Value Date/Time   CHOL 158 09/07/2014 05:51 AM   TRIG 116 09/07/2014 05:51 AM   HDL 46 09/07/2014 05:51 AM   CHOLHDL 3.4 09/07/2014 05:51 AM   LDLCALC 89 09/07/2014 05:51 AM    Wt Readings from Last 3 Encounters:  01/22/20 172 lb (78 kg)  01/15/20 171 lb (77.6 kg)  01/01/20 171 lb 1.3 oz (77.6 kg)     Risk Assessment/Calculations:      Objective:    Vital Signs:  Ht 6' (1.829 m)   Wt 172 lb (78 kg)   BMI 23.33 kg/m    VITAL SIGNS:  reviewed Patient told me that blood pressure machine is not working he will try to get [place] the reports of vital signs from yesterday at the dialysis center.  ASSESSMENT & PLAN:    1. Coronary artery disease and abnormal nuclear stress test: Patient has advanced cardiomyopathy.  This is significant.  His ejection fraction is moderately depressed.  He is on dialysis.  He wants to be evaluated to see if his candidate  for transplant.  At this point he has had a stress test which reveals ejection fraction which is depressed and 25%.  It is also suggestive of minimal ischemia.  In view of this I would like him to be referred to heart failure clinic for their help in medical management.  Hopefully we can help him with medical management and get his ejection better so he can be a follow-up possibly getting back on the transplant list.  I discussed this with him and he vocalized understanding is and is agreeable.  I reviewed coronary geography report with the patient extensively this was done in 2018 and details are mentioned above. 2. Essential hypertension: Blood pressure stable 3. Diabetes mellitus: Diet was emphasized.  He vocalized understanding.  He will be seen in follow-up appointment in 3 months.  We will set up an appointment with our heart failure specialist. 4. Mixed dyslipidemia: Diet was emphasized.  Patient is on statin therapy and managed by primary care.        COVID-19 Education: The signs and symptoms of COVID-19 were discussed with the patient and how to seek care for testing (follow up with PCP or arrange E-visit).  The importance of social distancing was discussed today.  Time:   Today, I have spent 15 minutes with the patient with telehealth technology discussing the above problems.     Medication Adjustments/Labs and Tests Ordered: Current medicines are reviewed at length with the patient today.  Concerns regarding medicines are outlined above.   Tests Ordered: No orders of the defined types were placed in this encounter.   Medication Changes: No orders of the defined types were placed in this encounter.   Follow Up:  In Person 3month  Signed, RJenean Lindau MD  01/22/2020 2:41 PM    [place]

## 2020-02-15 ENCOUNTER — Other Ambulatory Visit (HOSPITAL_COMMUNITY): Payer: Self-pay | Admitting: Nephrology

## 2020-02-15 DIAGNOSIS — Z992 Dependence on renal dialysis: Secondary | ICD-10-CM

## 2020-02-19 ENCOUNTER — Other Ambulatory Visit: Payer: Self-pay | Admitting: Radiology

## 2020-02-21 ENCOUNTER — Ambulatory Visit (HOSPITAL_COMMUNITY)
Admission: RE | Admit: 2020-02-21 | Discharge: 2020-02-21 | Disposition: A | Discharge status: Home / Self Care | Payer: Medicare (Managed Care) | Source: Ambulatory Visit | Attending: Nephrology | Admitting: Nephrology

## 2020-02-21 ENCOUNTER — Encounter (HOSPITAL_COMMUNITY): Payer: Self-pay

## 2020-02-21 ENCOUNTER — Ambulatory Visit: Payer: Medicare (Managed Care) | Admitting: Cardiology

## 2020-02-21 ENCOUNTER — Other Ambulatory Visit: Payer: Self-pay

## 2020-02-21 DIAGNOSIS — Z992 Dependence on renal dialysis: Secondary | ICD-10-CM

## 2020-02-21 DIAGNOSIS — N186 End stage renal disease: Secondary | ICD-10-CM

## 2020-02-27 ENCOUNTER — Telehealth (HOSPITAL_COMMUNITY): Payer: Self-pay

## 2020-02-27 NOTE — Telephone Encounter (Signed)
Called to reschedule fistulagram, no answer, left vm. AW  

## 2020-03-05 ENCOUNTER — Other Ambulatory Visit: Payer: Self-pay | Admitting: Student

## 2020-03-06 ENCOUNTER — Ambulatory Visit (HOSPITAL_COMMUNITY): Admission: RE | Admit: 2020-03-06 | Payer: Medicare (Managed Care) | Source: Ambulatory Visit

## 2020-03-20 ENCOUNTER — Telehealth: Payer: Self-pay

## 2020-03-20 NOTE — Telephone Encounter (Signed)
Spoke with patient regarding setting up an appointment. Patient states he needs to see Dr. Geraldo Pitter because he has not been feeling well. Patient has dialysis tomorrow and will try to make is 10 am appointment. No further questions expressed at this time.

## 2020-03-21 ENCOUNTER — Inpatient Hospital Stay (HOSPITAL_COMMUNITY): Payer: Medicare (Managed Care)

## 2020-03-21 ENCOUNTER — Emergency Department (HOSPITAL_COMMUNITY): Payer: Medicare (Managed Care)

## 2020-03-21 ENCOUNTER — Other Ambulatory Visit: Payer: Self-pay

## 2020-03-21 ENCOUNTER — Ambulatory Visit: Payer: Medicare (Managed Care) | Admitting: Cardiology

## 2020-03-21 ENCOUNTER — Inpatient Hospital Stay (HOSPITAL_COMMUNITY)
Admission: EM | Admit: 2020-03-21 | Discharge: 2020-03-27 | DRG: 177 | Disposition: A | Discharge status: Home / Self Care | Payer: Medicare (Managed Care) | Attending: Internal Medicine | Admitting: Internal Medicine

## 2020-03-21 DIAGNOSIS — Z20822 Contact with and (suspected) exposure to covid-19: Secondary | ICD-10-CM | POA: Diagnosis present

## 2020-03-21 DIAGNOSIS — I272 Pulmonary hypertension, unspecified: Secondary | ICD-10-CM | POA: Diagnosis present

## 2020-03-21 DIAGNOSIS — Z992 Dependence on renal dialysis: Secondary | ICD-10-CM

## 2020-03-21 DIAGNOSIS — D649 Anemia, unspecified: Secondary | ICD-10-CM | POA: Diagnosis not present

## 2020-03-21 DIAGNOSIS — E119 Type 2 diabetes mellitus without complications: Secondary | ICD-10-CM | POA: Diagnosis not present

## 2020-03-21 DIAGNOSIS — I429 Cardiomyopathy, unspecified: Secondary | ICD-10-CM | POA: Diagnosis present

## 2020-03-21 DIAGNOSIS — E1129 Type 2 diabetes mellitus with other diabetic kidney complication: Secondary | ICD-10-CM | POA: Diagnosis not present

## 2020-03-21 DIAGNOSIS — Z7982 Long term (current) use of aspirin: Secondary | ICD-10-CM | POA: Diagnosis not present

## 2020-03-21 DIAGNOSIS — E8770 Fluid overload, unspecified: Secondary | ICD-10-CM | POA: Insufficient documentation

## 2020-03-21 DIAGNOSIS — Z87891 Personal history of nicotine dependence: Secondary | ICD-10-CM | POA: Diagnosis not present

## 2020-03-21 DIAGNOSIS — J439 Emphysema, unspecified: Secondary | ICD-10-CM | POA: Diagnosis present

## 2020-03-21 DIAGNOSIS — I132 Hypertensive heart and chronic kidney disease with heart failure and with stage 5 chronic kidney disease, or end stage renal disease: Secondary | ICD-10-CM | POA: Diagnosis present

## 2020-03-21 DIAGNOSIS — IMO0002 Reserved for concepts with insufficient information to code with codable children: Secondary | ICD-10-CM | POA: Diagnosis present

## 2020-03-21 DIAGNOSIS — E11649 Type 2 diabetes mellitus with hypoglycemia without coma: Secondary | ICD-10-CM | POA: Diagnosis not present

## 2020-03-21 DIAGNOSIS — Z8249 Family history of ischemic heart disease and other diseases of the circulatory system: Secondary | ICD-10-CM

## 2020-03-21 DIAGNOSIS — N2581 Secondary hyperparathyroidism of renal origin: Secondary | ICD-10-CM | POA: Diagnosis present

## 2020-03-21 DIAGNOSIS — Z8674 Personal history of sudden cardiac arrest: Secondary | ICD-10-CM

## 2020-03-21 DIAGNOSIS — Z7984 Long term (current) use of oral hypoglycemic drugs: Secondary | ICD-10-CM | POA: Diagnosis not present

## 2020-03-21 DIAGNOSIS — E1165 Type 2 diabetes mellitus with hyperglycemia: Secondary | ICD-10-CM | POA: Diagnosis present

## 2020-03-21 DIAGNOSIS — D631 Anemia in chronic kidney disease: Secondary | ICD-10-CM | POA: Diagnosis present

## 2020-03-21 DIAGNOSIS — E7800 Pure hypercholesterolemia, unspecified: Secondary | ICD-10-CM | POA: Diagnosis present

## 2020-03-21 DIAGNOSIS — J9601 Acute respiratory failure with hypoxia: Secondary | ICD-10-CM | POA: Diagnosis present

## 2020-03-21 DIAGNOSIS — D509 Iron deficiency anemia, unspecified: Secondary | ICD-10-CM | POA: Diagnosis present

## 2020-03-21 DIAGNOSIS — I2510 Atherosclerotic heart disease of native coronary artery without angina pectoris: Secondary | ICD-10-CM | POA: Diagnosis present

## 2020-03-21 DIAGNOSIS — E871 Hypo-osmolality and hyponatremia: Secondary | ICD-10-CM | POA: Diagnosis present

## 2020-03-21 DIAGNOSIS — I5042 Chronic combined systolic (congestive) and diastolic (congestive) heart failure: Secondary | ICD-10-CM | POA: Diagnosis present

## 2020-03-21 DIAGNOSIS — J869 Pyothorax without fistula: Secondary | ICD-10-CM | POA: Diagnosis present

## 2020-03-21 DIAGNOSIS — E1122 Type 2 diabetes mellitus with diabetic chronic kidney disease: Secondary | ICD-10-CM | POA: Diagnosis present

## 2020-03-21 DIAGNOSIS — Z825 Family history of asthma and other chronic lower respiratory diseases: Secondary | ICD-10-CM

## 2020-03-21 DIAGNOSIS — J90 Pleural effusion, not elsewhere classified: Secondary | ICD-10-CM | POA: Diagnosis present

## 2020-03-21 DIAGNOSIS — I252 Old myocardial infarction: Secondary | ICD-10-CM

## 2020-03-21 DIAGNOSIS — I5082 Biventricular heart failure: Secondary | ICD-10-CM | POA: Diagnosis present

## 2020-03-21 DIAGNOSIS — B9561 Methicillin susceptible Staphylococcus aureus infection as the cause of diseases classified elsewhere: Secondary | ICD-10-CM | POA: Diagnosis present

## 2020-03-21 DIAGNOSIS — Z79899 Other long term (current) drug therapy: Secondary | ICD-10-CM | POA: Diagnosis not present

## 2020-03-21 DIAGNOSIS — N186 End stage renal disease: Secondary | ICD-10-CM

## 2020-03-21 DIAGNOSIS — Z833 Family history of diabetes mellitus: Secondary | ICD-10-CM | POA: Diagnosis not present

## 2020-03-21 LAB — CBC WITH DIFFERENTIAL/PLATELET
Abs Immature Granulocytes: 0.09 10*3/uL — ABNORMAL HIGH (ref 0.00–0.07)
Basophils Absolute: 0 10*3/uL (ref 0.0–0.1)
Basophils Relative: 0 %
Eosinophils Absolute: 0 10*3/uL (ref 0.0–0.5)
Eosinophils Relative: 0 %
HCT: 26.7 % — ABNORMAL LOW (ref 39.0–52.0)
Hemoglobin: 9.1 g/dL — ABNORMAL LOW (ref 13.0–17.0)
Immature Granulocytes: 1 %
Lymphocytes Relative: 3 %
Lymphs Abs: 0.3 10*3/uL — ABNORMAL LOW (ref 0.7–4.0)
MCH: 32.5 pg (ref 26.0–34.0)
MCHC: 34.1 g/dL (ref 30.0–36.0)
MCV: 95.4 fL (ref 80.0–100.0)
Monocytes Absolute: 0.8 10*3/uL (ref 0.1–1.0)
Monocytes Relative: 8 %
Neutro Abs: 9 10*3/uL — ABNORMAL HIGH (ref 1.7–7.7)
Neutrophils Relative %: 88 %
Platelets: 433 10*3/uL — ABNORMAL HIGH (ref 150–400)
RBC: 2.8 MIL/uL — ABNORMAL LOW (ref 4.22–5.81)
RDW: 15.2 % (ref 11.5–15.5)
WBC: 10.3 10*3/uL (ref 4.0–10.5)
nRBC: 0 % (ref 0.0–0.2)

## 2020-03-21 LAB — HEMOGLOBIN A1C
Hgb A1c MFr Bld: 9.7 % — ABNORMAL HIGH (ref 4.8–5.6)
Mean Plasma Glucose: 231.69 mg/dL

## 2020-03-21 LAB — TSH: TSH: 2.115 u[IU]/mL (ref 0.350–4.500)

## 2020-03-21 LAB — BASIC METABOLIC PANEL
Anion gap: 14 (ref 5–15)
BUN: 35 mg/dL — ABNORMAL HIGH (ref 8–23)
CO2: 29 mmol/L (ref 22–32)
Calcium: 9.2 mg/dL (ref 8.9–10.3)
Chloride: 90 mmol/L — ABNORMAL LOW (ref 98–111)
Creatinine, Ser: 6.62 mg/dL — ABNORMAL HIGH (ref 0.61–1.24)
GFR, Estimated: 9 mL/min — ABNORMAL LOW (ref >60)
Glucose, Bld: 244 mg/dL — ABNORMAL HIGH (ref 70–99)
Potassium: 3.9 mmol/L (ref 3.5–5.1)
Sodium: 133 mmol/L — ABNORMAL LOW (ref 135–145)

## 2020-03-21 LAB — TROPONIN I (HIGH SENSITIVITY)
Troponin I (High Sensitivity): 31 ng/L — ABNORMAL HIGH (ref <18)
Troponin I (High Sensitivity): 42 ng/L — ABNORMAL HIGH (ref <18)
Troponin I (High Sensitivity): 51 ng/L — ABNORMAL HIGH (ref <18)
Troponin I (High Sensitivity): 56 ng/L — ABNORMAL HIGH (ref <18)

## 2020-03-21 LAB — GLUCOSE, CAPILLARY: Glucose-Capillary: 130 mg/dL — ABNORMAL HIGH (ref 70–99)

## 2020-03-21 LAB — SARS CORONAVIRUS 2 (TAT 6-24 HRS): SARS Coronavirus 2: NEGATIVE

## 2020-03-21 LAB — HEPATIC FUNCTION PANEL
ALT: 25 U/L (ref 0–44)
AST: 27 U/L (ref 15–41)
Albumin: 2.1 g/dL — ABNORMAL LOW (ref 3.5–5.0)
Alkaline Phosphatase: 137 U/L — ABNORMAL HIGH (ref 38–126)
Bilirubin, Direct: 0.2 mg/dL (ref 0.0–0.2)
Indirect Bilirubin: 0.7 mg/dL (ref 0.3–0.9)
Total Bilirubin: 0.9 mg/dL (ref 0.3–1.2)
Total Protein: 6.8 g/dL (ref 6.5–8.1)

## 2020-03-21 LAB — BRAIN NATRIURETIC PEPTIDE: B Natriuretic Peptide: >4500 pg/mL — ABNORMAL HIGH (ref 0.0–100.0)

## 2020-03-21 LAB — LACTATE DEHYDROGENASE: LDH: 136 U/L (ref 98–192)

## 2020-03-21 MED ORDER — HEPARIN SODIUM (PORCINE) 5000 UNIT/ML IJ SOLN
5000.0000 [IU] | Freq: Three times a day (TID) | INTRAMUSCULAR | Status: DC
  Administered 2020-03-21 – 2020-03-27 (×17): 5000 [IU] via SUBCUTANEOUS
  Filled 2020-03-21 (×17): qty 1

## 2020-03-21 MED ORDER — CHLORHEXIDINE GLUCONATE CLOTH 2 % EX PADS
6.0000 | MEDICATED_PAD | Freq: Every day | CUTANEOUS | Status: DC
  Administered 2020-03-23 – 2020-03-27 (×3): 6 via TOPICAL

## 2020-03-21 MED ORDER — ACETAMINOPHEN 650 MG RE SUPP: 650.0000 mg | Freq: Four times a day (QID) | RECTAL | Status: DC | PRN

## 2020-03-21 MED ORDER — INSULIN ASPART 100 UNIT/ML ~~LOC~~ SOLN: 0.0000 [IU] | Freq: Three times a day (TID) | SUBCUTANEOUS | Status: DC

## 2020-03-21 MED ORDER — ASPIRIN EC 81 MG PO TBEC
81.0000 mg | DELAYED_RELEASE_TABLET | Freq: Every day | ORAL | Status: DC
  Administered 2020-03-21 – 2020-03-27 (×7): 81 mg via ORAL
  Filled 2020-03-21 (×7): qty 1

## 2020-03-21 MED ORDER — HEPARIN SODIUM (PORCINE) 1000 UNIT/ML DIALYSIS
3000.0000 [IU] | INTRAMUSCULAR | Status: DC | PRN
  Administered 2020-03-21: 3000 [IU] via INTRAVENOUS_CENTRAL
  Filled 2020-03-21: qty 3

## 2020-03-21 MED ORDER — LIDOCAINE HCL (PF) 1 % IJ SOLN
INTRAMUSCULAR | Status: AC | PRN
  Administered 2020-03-21: 10 mL

## 2020-03-21 MED ORDER — DOXERCALCIFEROL 4 MCG/2ML IV SOLN
3.0000 ug | INTRAVENOUS | Status: DC
  Administered 2020-03-26: 3 ug via INTRAVENOUS
  Filled 2020-03-21 (×3): qty 2

## 2020-03-21 MED ORDER — LIDOCAINE HCL 1 % IJ SOLN
INTRAMUSCULAR | Status: AC
  Filled 2020-03-21: qty 20

## 2020-03-21 MED ORDER — HEPARIN SODIUM (PORCINE) 1000 UNIT/ML IJ SOLN
INTRAMUSCULAR | Status: AC
  Filled 2020-03-21: qty 3

## 2020-03-21 MED ORDER — CARVEDILOL 12.5 MG PO TABS
12.5000 mg | ORAL_TABLET | Freq: Two times a day (BID) | ORAL | Status: DC
  Administered 2020-03-21: 12.5 mg via ORAL
  Filled 2020-03-21: qty 1

## 2020-03-21 MED ORDER — INSULIN ASPART 100 UNIT/ML ~~LOC~~ SOLN: 0.0000 [IU] | Freq: Every day | SUBCUTANEOUS | Status: DC

## 2020-03-21 MED ORDER — CALCIUM ACETATE (PHOS BINDER) 667 MG PO CAPS
667.0000 mg | ORAL_CAPSULE | Freq: Three times a day (TID) | ORAL | Status: DC
  Administered 2020-03-21 – 2020-03-27 (×17): 667 mg via ORAL
  Filled 2020-03-21 (×17): qty 1

## 2020-03-21 MED ORDER — ROSUVASTATIN CALCIUM 5 MG PO TABS
5.0000 mg | ORAL_TABLET | Freq: Every day | ORAL | Status: DC
  Administered 2020-03-21 – 2020-03-27 (×7): 5 mg via ORAL
  Filled 2020-03-21 (×7): qty 1

## 2020-03-21 MED ORDER — ACETAMINOPHEN 325 MG PO TABS
650.0000 mg | ORAL_TABLET | Freq: Four times a day (QID) | ORAL | Status: DC | PRN
  Administered 2020-03-24 – 2020-03-27 (×5): 650 mg via ORAL
  Filled 2020-03-21 (×5): qty 2

## 2020-03-21 NOTE — Consult Note (Signed)
Renal Service Consult Note [facility]  KASE SHUGHART 03/21/2020 Sol Blazing, MD Requesting Physician: Dr. Ron Parker, E.   Reason for Consult: ESRD pt w/ DOE HPI: The patient is a 65 y.o. year-old w/ hx of DM2, HL, HTN, ESRD on HD (2016), chronic combined syst/ diast CHF, CAD, hx cardiac arrest went to HD this am and was lightheaded, did not receive dialysis. Sent to ED. Per notes pt c/o DOE for last few weeks. Hx of heart failure. In ED BP's normal to high, 95% sat on 2L [place], HR 87 and RR 29 >> down to 21 now. CXR showed quite large L pleural effusion, smaller R effusion and possible mild IS edema on L side.  No chest pain or fevers. Asked to see for ESRD.    Pt started HD in 2016, says he was dx'd w/ CHF around the same time in 2015-16.  Pt states that later on his heart "recovered". Looking at old echo's it does show a change in LVEF from 10-15% in 2016- 2016 , up to 55-60% in 2017.  Pt main c/o is DOE.  No ankle edema. Sometimes will have extra fluid and that causes abd swelling, not ankle swelling typically.   No prod cough, fever or chilles.     ROS  denies CP  no joint pain   no HA  no blurry vision  no rash  no diarrhea  no nausea/ vomiting   Past Medical History  Past Medical History:  Diagnosis Date  . Abnormal nuclear stress test 09/23/2016  . Acute on chronic combined systolic and diastolic CHF, NYHA class 2 ([place]) 01/15/2014  . Acute respiratory failure with hypoxia ([place])   . Aftercare including intermittent dialysis ([place]) 07/31/2016  . Allergy, unspecified, initial encounter 10/04/2019  . Anaphylactic shock, unspecified, initial encounter 10/04/2019  . Anasarca 08/20/2014  . Anemia in chronic kidney disease 08/20/2014  . Arteriovenous graft infection ([place]) 08/29/2015  . Ascites 05/11/2016  . Bacteremia   . Benign essential HTN 05/11/2016   Formatting of this note might be different from the original. Last Assessment & Plan:  Blood pressure is running low on  dialysis days. He is feeling weakAnd systolic ranges in the 61W. Decrease Coreg to 12.5 mg twice a day and follow.  Marland Kitchen CAD (coronary artery disease)   . Cardiac arrest ([place])    07/2014-[place] initiated  . Cardiorenal syndrome with renal failure   . CHF (congestive heart failure) ([place])   . Chronic systolic CHF (congestive heart failure) ([place]) 09/23/2016  . Coronary artery disease involving native coronary artery of native heart without angina pectoris 05/11/2016   Formatting of this note might be different from the original. Last Assessment & Plan:  Continue aspirin and statin.  . Diabetes mellitus without complication (HCC)    Type 2  . DM (diabetes mellitus), type 2, uncontrolled, with renal complications ([place]) 4/31/5400  . Embolism due to vascular prosthetic devices, implants and grafts, initial encounter ([place]) 07/31/2016  . Encounter for immunization 07/31/2016  . Encounter for removal of sutures 11/08/2017  . Encounter for screening for cardiovascular disorders 07/31/2016  . Encounter for screening for respiratory tuberculosis 07/31/2016  . ESRD (end stage renal disease) ([place])    [place] M/W/F  . ESRD (end stage renal disease) on dialysis ([place]) 04/22/2016   Formatting of this note might be different from the original. Last Assessment & Plan:  Management per nephrology.  . Essential (primary) hypertension 07/31/2016  . Heart murmur  as a child  . Hyperlipidemia 01/01/2015   Formatting of this note might be different from the original. Last Assessment & Plan:  Continue statin.  Marland Kitchen Hypertension   . Infection and inflammatory reaction due to cardiac device, implant, and graft ([place]) 10/06/2015   Due to [place] (dialysis access graft)  . Iron deficiency anemia, unspecified 07/31/2016  . NSVT (nonsustained ventricular tachycardia) ([place]) 09/02/2014  . Pressure ulcer 08/22/2014  . Pure hypercholesterolemia, unspecified 01/21/2020  . Secondary cardiomyopathy ([place])   . Secondary hyperparathyroidism of  renal origin ([place]) 07/31/2016  . Staphylococcus aureus bacteremia 2015/09/20  . Sudden death history (living patient) 04/11/2015  . Thyroid nodule 01/01/2015   Formatting of this note might be different from the original. Last Assessment & Plan:  Patient has a thyroid nodule which needs biopsy. I have instructed him to follow-up with primary care for this issue. I have also instructed him to contact me if there is any delays and I would arrange further evaluation.  . Troponin level elevated 08/20/2014  . Type 2 diabetes mellitus with other diabetic kidney complication ([place]) 04/01/486  . Type 2 diabetes mellitus without complication ([place]) 8/91/6945  . Unspecified protein-calorie malnutrition ([place]) 06/19/2018   Past Surgical History  Past Surgical History:  Procedure Laterality Date  . AV FISTULA PLACEMENT Left 09/11/2014   Procedure: INSERTION OF LEFT ARM 6MM ARTERIOVENOUS GORTEX GRAFT ;  Surgeon: Elam Dutch, MD;  Location: [place];  Service: Vascular;  Laterality: Left;  . AV FISTULA PLACEMENT Right 10/14/2015   Procedure: ARTERIOVENOUS (AV) FISTULA CREATION;  Surgeon: Angelia Mould, MD;  Location: [place];  Service: Vascular;  Laterality: Right;  . AV FISTULA PLACEMENT Left 07/11/2018   Procedure: INSERTION OF ARTERIOVENOUS (AV) GORE-TEX STRETCH VASCULAR GRAFT LEFT ARM;  Surgeon: Angelia Mould, MD;  Location: [place];  Service: Vascular;  Laterality: Left;  . CARDIAC CATHETERIZATION N/A 09/04/2014   Procedure: Right/Left Heart Cath and Coronary Angiography;  Surgeon: Peter M Martinique, MD;  Location: [facility];  Service: Cardiovascular;  Laterality: N/A;  . EXTERNAL EAR SURGERY    . FINGER SURGERY     for infection  . INSERTION OF DIALYSIS CATHETER N/A 08/21/2014   Procedure: INSERTION OF DIALYSIS CATHETER RIGHT INTERNAL JUGULAR VEIN;  Surgeon: Conrad Troy, MD;  Location: [place] [facility];  Service: Vascular;  Laterality: N/A;  MAC to General  . INSERTION OF DIALYSIS CATHETER N/A 09/08/2015    Procedure: INSERTION OF DIALYSIS CATHETER RIGHT INTERNAL JUGULAR;  Surgeon: Rosetta Posner, MD;  Location: [facility];  Service: Vascular;  Laterality: N/A;  . LEFT HEART CATH AND CORONARY ANGIOGRAPHY N/A 09/23/2016   Procedure: LEFT HEART CATH AND CORONARY ANGIOGRAPHY;  Surgeon: Martinique, Peter M, MD;  Location: [place] [facility];  Service: Cardiovascular;  Laterality: N/A;  . PATCH ANGIOPLASTY Right 12/04/2015   Procedure: WITH 1 X 6 CM [place] ANGIOPLASTY TO RIGHT ARM BRACHIO-CEPHALIC FISTULA;  Surgeon: Conrad [place], MD;  Location: [place];  Service: Vascular;  Laterality: Right;  . PERIPHERAL VASCULAR CATHETERIZATION N/A 12/04/2015   Procedure: Fistulagram - Right Arm;  Surgeon: Conrad Alba, MD;  Location: [place] [facility];  Service: Cardiovascular;  Laterality: N/A;  . REVISION OF ARTERIOVENOUS GORETEX GRAFT Left 08/31/2015   Procedure: REVISION OF LEFT ARTERIOVENOUS GORETEX GRAFT USING  AN INTERPOSITION  4-7MM GORTEX GRAFT WITH REMOVAL OF INFECTED STENT;  INTRAOPERATIVE ARTERIOGRAM TIMES ONE.;  Surgeon: Angelia Mould, MD;  Location: [facility];  Service:  Vascular;  Laterality: Left;  . REVISON OF ARTERIOVENOUS FISTULA Right 12/04/2015   Procedure: REVISON OF RIGHT BRACHIO--CEPHALIC  ARTERIOVENOUS FISTULA;  Surgeon: Conrad [place], MD;  Location: [place];  Service: Vascular;  Laterality: Right;  . TONSILLECTOMY     Family History  Family History  Problem Relation Age of Onset  . Heart failure Mother   . Diabetes Mother   . Hypertension Mother   . Diabetes Father    Social History  reports that he quit smoking about 37 years ago. His smoking use included cigarettes. He has never used smokeless tobacco. He reports that he does not drink alcohol and does not use drugs. Allergies No Known Allergies Home medications Prior to Admission medications   Medication Sig Start Date End Date Taking? Authorizing Provider  aspirin 81 MG tablet Take 81 mg by mouth daily.    [provider]  B Complex-C-Folic Acid (RENA-VITE RX) 1 MG TABS Take 1 tablet by mouth daily. 05/29/15   [provider]  calcium acetate (PHOSLO) 667 MG capsule Take 667 mg by mouth 3 (three) times daily with meals. 12/28/19   [provider]  carvedilol (COREG) 12.5 MG tablet Take 1 tablet (12.5 mg total) by mouth 2 (two) times daily with a meal. 07/09/16   Hilty, Nadean Corwin, MD  glimepiride (AMARYL) 2 MG tablet Take 1 tablet (2 mg total) by mouth daily with breakfast. 01/17/14   Barton Dubois, MD  lisinopril (PRINIVIL,ZESTRIL) 20 MG tablet TAKE 1 TABLET BY MOUTH DAILY Patient taking differently: Take 20 mg by mouth daily. 09/28/17   Lelon Perla, MD  nitroGLYCERIN (NITROSTAT) 0.4 MG SL tablet Place 1 tablet (0.4 mg total) under the tongue every 5 (five) minutes as needed. 01/01/20 03/31/20  Revankar, Reita Cliche, MD  rosuvastatin (CRESTOR) 5 MG tablet Take 5 mg by mouth daily.    [provider]     Vitals:   03/21/20 1000 03/21/20 1015 03/21/20 1030 03/21/20 1045  BP: (!) 151/81 (!) 157/81 (!) 142/85 (!) 118/101  Pulse: 83 81 83 78  Resp: (!) 22 (!) 40 (!) 28 (!) 29  Temp:      TempSrc:      SpO2: 100% 98% 100% 98%   Exam Gen alert, no distress No rash, cyanosis or gangrene Sclera anicteric, throat clear  +JVD mid neck at 45deg Chest dec'd bilat bases L worse than R, o/w clear, no rales RRR no MRG Abd soft ntnd no mass or ascites +bs GU normal male MS no joint effusions or deformity Ext no LE or UE edema, no wounds or ulcers Neuro is alert, Ox 3 , nf   LUE AVF+bruit    Home meds:  - asa 81/ crestor 5 / lisinopril 20 qd/ coreg 12.5 bid/ sl ntg prn  - phoslo 1 ac tid/ amaryl 1m qam  - prn's/ vitamins/ supplements     OP HD: [facility] MWF  4h  450/500  75.5kg  2/2.25 bath  LUE AVF  Hep 7000  -mircera 30 q4wk, last 2/7  - hect 3ug tiw   Assessment/ Plan: 1. DOE/ pleural effusions - and likely some volume overload. BP's are up, no wt's available yet. Not grossly vol  overloaded but does have large pleural effusions by CXR.  Question IS edema on CXR.  Get OP HD info, plan HD here today w/ max UF as BP tolerates.  Dialysis may or may not improve the pleural effusions.  2. HTN/ vol - cont coreg/  acei. Lower vol as tolerated by BP's, not grossly edematous.  3. H/o CM - last echo showed recovery to 60% EF in 2017 4. ESRD - usual HD MWF. HD today in house. Possible extra HD sat  5. 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 Anemia ckd - last esa 2/7, not due for another 10 days. Watch Hb, tx prn      Kelly Splinter  MD 03/21/2020, 11:08 AM  Recent Labs  Lab 03/21/20 0841  WBC 10.3  HGB 9.1*   Recent Labs  Lab 03/21/20 0841  K 3.9  BUN 35*  CREATININE 6.62*  CALCIUM 9.2

## 2020-03-21 NOTE — ED Triage Notes (Addendum)
Pt BIB [facility] for lightheadedness while entering the dialysis center for his MWF tx.   PT did not receive Dialysis.  ST on monitor for EMS.  Pt states he forgot to take his Carvedilol and Lisinopril last night.  Pt has been vaxed and boosted for Covid.

## 2020-03-21 NOTE — Progress Notes (Signed)
Interventional Radiology Brief Note:  Patient brought to IR for possible thoracentesis.  Limited US chest shows a collection of thick vs. Congealed fluid not likely amenable to thoracentesis.  Reviewed imaging with Dr. Anselm Pancoast who recommends CT Chest with IV contrast.   Paged ordering service.   Brynda Greathouse, MS RD PA-C 3:41 PM

## 2020-03-21 NOTE — ED Provider Notes (Signed)
[facility] Provider Note   CSN: [id number] Arrival date & time: 03/21/20  0730     History Chief Complaint  Patient presents with  . Dizziness    Kyle [name] is a 65 y.o. male.   Dizziness Quality:  Lightheadedness Severity:  Moderate Onset quality:  Gradual Timing:  Intermittent Progression:  Waxing and waning Chronicity:  New Context comment:  Exertion Relieved by: rest. Worsened by:  Nothing Ineffective treatments:  None tried Associated symptoms: shortness of breath (with exertion)   Associated symptoms: no chest pain, no diarrhea, no headaches, no nausea, no palpitations, no syncope, no vision changes and no vomiting        Past Medical History:  Diagnosis Date  . Abnormal nuclear stress test 09/23/2016  . Acute on chronic combined systolic and diastolic CHF, NYHA class 2 ([place]) 01/15/2014  . Acute respiratory failure with hypoxia ([place])   . Aftercare including intermittent dialysis ([place]) 07/31/2016  . Allergy, unspecified, initial encounter 10/04/2019  . Anaphylactic shock, unspecified, initial encounter 10/04/2019  . Anasarca 08/20/2014  . Anemia in chronic kidney disease 08/20/2014  . Arteriovenous graft infection ([place]) 09/04/2015  . Ascites 05/11/2016  . Bacteremia   . Benign essential HTN 05/11/2016   Formatting of this note might be different from the original. Last Assessment & Plan:  Blood pressure is running low on dialysis days. He is feeling weakAnd systolic ranges in the 78G. Decrease Coreg to 12.5 mg twice a day and follow.  Marland Kitchen CAD (coronary artery disease)   . Cardiac arrest ([place])    07/2014-[place] initiated  . Cardiorenal syndrome with renal failure   . CHF (congestive heart failure) ([place])   . Chronic systolic CHF (congestive heart failure) ([place]) 09/23/2016  . Coronary artery disease involving native coronary artery of native heart without angina pectoris 05/11/2016   Formatting of this note might be different  from the original. Last Assessment & Plan:  Continue aspirin and statin.  . Diabetes mellitus without complication (HCC)    Type 2  . DM (diabetes mellitus), type 2, uncontrolled, with renal complications ([place]) 9/56/2130  . Embolism due to vascular prosthetic devices, implants and grafts, initial encounter ([place]) 07/31/2016  . Encounter for immunization 07/31/2016  . Encounter for removal of sutures 11/08/2017  . Encounter for screening for cardiovascular disorders 07/31/2016  . Encounter for screening for respiratory tuberculosis 07/31/2016  . ESRD (end stage renal disease) ([place])    [place] M/W/F  . ESRD (end stage renal disease) on dialysis ([place]) 04/22/2016   Formatting of this note might be different from the original. Last Assessment & Plan:  Management per nephrology.  . Essential (primary) hypertension 07/31/2016  . Heart murmur    as a child  . Hyperlipidemia 01/01/2015   Formatting of this note might be different from the original. Last Assessment & Plan:  Continue statin.  Marland Kitchen Hypertension   . Infection and inflammatory reaction due to cardiac device, implant, and graft ([place]) 10/06/2015   Due to [facility] (dialysis access graft)  . Iron deficiency anemia, unspecified 07/31/2016  . NSVT (nonsustained ventricular tachycardia) ([place]) 09/02/2014  . Pressure ulcer 08/22/2014  . Pure hypercholesterolemia, unspecified 01/21/2020  . Secondary cardiomyopathy ([place])   . Secondary hyperparathyroidism of renal origin ([place]) 07/31/2016  . Staphylococcus aureus bacteremia 09/04/15  . Sudden death history (living patient) 04/11/2015  . Thyroid nodule 01/01/2015   Formatting of this note might be different from the original. Last Assessment & Plan:  Patient  has a thyroid nodule which needs biopsy. I have instructed him to follow-up with primary care for this issue. I have also instructed him to contact me if there is any delays and I would arrange further evaluation.  . Troponin level elevated 08/20/2014  . Type 2  diabetes mellitus with other diabetic kidney complication ([place]) 04/26/7060  . Type 2 diabetes mellitus without complication ([place]) 3/76/2831  . Unspecified protein-calorie malnutrition ([place]) 06/19/2018    Patient Active Problem List   Diagnosis Date Noted  . Pure hypercholesterolemia, unspecified 01/21/2020  . CAD (coronary artery disease)   . CHF (congestive heart failure) ([place])   . ESRD (end stage renal disease) ([place])   . Diabetes mellitus without complication ([place])   . Heart murmur   . Hypertension   . Allergy, unspecified, initial encounter 10/04/2019  . Anaphylactic shock, unspecified, initial encounter 10/04/2019  . Unspecified protein-calorie malnutrition ([place]) 06/19/2018  . Encounter for removal of sutures 11/08/2017  . Chronic systolic CHF (congestive heart failure) ([place]) 09/23/2016  . Abnormal nuclear stress test 09/23/2016  . Aftercare including intermittent dialysis ([place]) 07/31/2016  . Encounter for screening for cardiovascular disorders 07/31/2016  . Encounter for screening for respiratory tuberculosis 07/31/2016  . Iron deficiency anemia, unspecified 07/31/2016  . Secondary hyperparathyroidism of renal origin ([place]) 07/31/2016  . Essential (primary) hypertension 07/31/2016  . Type 2 diabetes mellitus with other diabetic kidney complication ([place]) 51/76/1607  . Embolism due to vascular prosthetic devices, implants and grafts, initial encounter ([place]) 07/31/2016  . Encounter for immunization 07/31/2016  . Benign essential HTN 05/11/2016  . Ascites 05/11/2016  . Cardiac arrest ([place]) 05/11/2016  . Coronary artery disease involving native coronary artery of native heart without angina pectoris 05/11/2016  . ESRD (end stage renal disease) on dialysis ([place]) 04/22/2016  . Infection and inflammatory reaction due to cardiac device, implant, and graft ([place]) 10/06/2015  . Bacteremia   . Staphylococcus aureus bacteremia 2015-09-09  . Arteriovenous graft infection ([place]) September 09, 2015  .  Sudden death history (living patient) 04/11/2015  . Type 2 diabetes mellitus without complication ([place]) 37/10/6267  . Hyperlipidemia 01/01/2015  . Thyroid nodule 01/01/2015  . Secondary cardiomyopathy ([place])   . NSVT (nonsustained ventricular tachycardia) ([place]) 09/02/2014  . Acute respiratory failure with hypoxia ([place])   . Pressure ulcer 08/22/2014  . DM (diabetes mellitus), type 2, uncontrolled, with renal complications ([place]) 48/54/6270  . Troponin level elevated 08/20/2014  . Anemia in chronic kidney disease 08/20/2014  . Anasarca 08/20/2014  . Cardiorenal syndrome with renal failure   . Acute on chronic combined systolic and diastolic CHF, NYHA class 2 ([place]) 01/15/2014    Past Surgical History:  Procedure Laterality Date  . AV FISTULA PLACEMENT Left 09/11/2014   Procedure: INSERTION OF LEFT ARM 6MM ARTERIOVENOUS GORTEX GRAFT ;  Surgeon: Elam Dutch, MD;  Location: [facility];  Service: Vascular;  Laterality: Left;  . AV FISTULA PLACEMENT Right 10/14/2015   Procedure: ARTERIOVENOUS (AV) FISTULA CREATION;  Surgeon: Angelia Mould, MD;  Location: [facility];  Service: Vascular;  Laterality: Right;  . AV FISTULA PLACEMENT Left 07/11/2018   Procedure: INSERTION OF ARTERIOVENOUS (AV) GORE-TEX STRETCH VASCULAR GRAFT LEFT ARM;  Surgeon: Angelia Mould, MD;  Location: [facility];  Service: Vascular;  Laterality: Left;  . CARDIAC CATHETERIZATION N/A 09/04/2014   Procedure: Right/Left Heart Cath and Coronary Angiography;  Surgeon: Peter M Martinique, MD;  Location: [facility];  Service: Cardiovascular;  Laterality: N/A;  . EXTERNAL EAR SURGERY    .  FINGER SURGERY     for infection  . INSERTION OF DIALYSIS CATHETER N/A 08/21/2014   Procedure: INSERTION OF DIALYSIS CATHETER RIGHT INTERNAL JUGULAR VEIN;  Surgeon: Conrad Riverview, MD;  Location: [facility];  Service: Vascular;  Laterality: N/A;  MAC to General  . INSERTION OF DIALYSIS CATHETER N/A 09/08/2015   Procedure: INSERTION OF DIALYSIS CATHETER  RIGHT INTERNAL JUGULAR;  Surgeon: Rosetta Posner, MD;  Location: [facility];  Service: Vascular;  Laterality: N/A;  . LEFT HEART CATH AND CORONARY ANGIOGRAPHY N/A 09/23/2016   Procedure: LEFT HEART CATH AND CORONARY ANGIOGRAPHY;  Surgeon: Martinique, Peter M, MD;  Location: [facility];  Service: Cardiovascular;  Laterality: N/A;  . PATCH ANGIOPLASTY Right 12/04/2015   Procedure: WITH 1 X 6 CM [place] ANGIOPLASTY TO RIGHT ARM BRACHIO-CEPHALIC FISTULA;  Surgeon: Conrad [place], MD;  Location: [facility];  Service: Vascular;  Laterality: Right;  . PERIPHERAL VASCULAR CATHETERIZATION N/A 12/04/2015   Procedure: Fistulagram - Right Arm;  Surgeon: Conrad Comstock, MD;  Location: [facility];  Service: Cardiovascular;  Laterality: N/A;  . REVISION OF ARTERIOVENOUS GORETEX GRAFT Left 08/31/2015   Procedure: REVISION OF LEFT ARTERIOVENOUS GORETEX GRAFT USING  AN INTERPOSITION  4-7MM GORTEX GRAFT WITH REMOVAL OF INFECTED STENT;  INTRAOPERATIVE ARTERIOGRAM TIMES ONE.;  Surgeon: Angelia Mould, MD;  Location: [facility];  Service: Vascular;  Laterality: Left;  . REVISON OF ARTERIOVENOUS FISTULA Right 12/04/2015   Procedure: REVISON OF RIGHT BRACHIO--CEPHALIC  ARTERIOVENOUS FISTULA;  Surgeon: Conrad Bobtown, MD;  Location: [facility];  Service: Vascular;  Laterality: Right;  . TONSILLECTOMY         Family History  Problem Relation Age of Onset  . Heart failure Mother   . Diabetes Mother   . Hypertension Mother   . Diabetes Father     Social History   Tobacco Use  . Smoking status: Former Smoker    Types: Cigarettes    Quit date: 06/1982    Years since quitting: 37.7  . Smokeless tobacco: Never Used  Vaping Use  . Vaping Use: Never used  Substance Use Topics  . Alcohol use: No  . Drug use: No    Home Medications Prior to Admission medications   Medication Sig Start Date End Date Taking? Authorizing Provider  aspirin 81 MG tablet Take 81 mg by mouth daily.    [provider]  B  Complex-C-Folic Acid (RENA-VITE RX) 1 MG TABS Take 1 tablet by mouth daily. 05/29/15   [provider]  calcium acetate (PHOSLO) 667 MG capsule Take 667 mg by mouth 3 (three) times daily with meals. 12/28/19   [provider]  carvedilol (COREG) 12.5 MG tablet Take 1 tablet (12.5 mg total) by mouth 2 (two) times daily with a meal. 07/09/16   Hilty, Nadean Corwin, MD  glimepiride (AMARYL) 2 MG tablet Take 1 tablet (2 mg total) by mouth daily with breakfast. 01/17/14   Barton Dubois, MD  lisinopril (PRINIVIL,ZESTRIL) 20 MG tablet TAKE 1 TABLET BY MOUTH DAILY Patient taking differently: Take 20 mg by mouth daily. 09/28/17   Lelon Perla, MD  nitroGLYCERIN (NITROSTAT) 0.4 MG SL tablet Place 1 tablet (0.4 mg total) under the tongue every 5 (five) minutes as needed. 01/01/20 03/31/20  Revankar, Reita Cliche, MD  rosuvastatin (CRESTOR) 5 MG tablet Take 5 mg by mouth daily.    [provider]    Allergies    Patient has no known allergies.  Review of Systems   Review of Systems  Constitutional: Negative for chills and fever.  HENT: Negative for congestion and rhinorrhea.   Respiratory: Positive for shortness of breath (with exertion). Negative for cough.   Cardiovascular: Negative for chest pain, palpitations and syncope.  Gastrointestinal: Negative for diarrhea, nausea and vomiting.  Genitourinary: Negative for difficulty urinating and dysuria.  Musculoskeletal: Negative for arthralgias and back pain.  Skin: Negative for color change and rash.  Neurological: Positive for dizziness and light-headedness. Negative for headaches.    Physical Exam Updated Vital Signs BP (!) 146/79   Pulse 87   Temp 98.3 F (36.8 C) (Oral)   Resp (!) 21   SpO2 100%   Physical Exam Vitals and nursing note reviewed. Exam conducted with a chaperone present.  Constitutional:      General: He is not in acute distress.    Appearance: Normal appearance.  HENT:     Head: Normocephalic and  atraumatic.     Nose: No rhinorrhea.  Eyes:     General:        Right eye: No discharge.        Left eye: No discharge.     Conjunctiva/sclera: Conjunctivae normal.  Cardiovascular:     Rate and Rhythm: Regular rhythm. Tachycardia present.     Heart sounds: No murmur heard. No gallop.   Pulmonary:     Effort: Pulmonary effort is normal.     Breath sounds: No stridor. Rales present.  Abdominal:     General: Abdomen is flat. There is no distension.     Palpations: Abdomen is soft.     Tenderness: There is no abdominal tenderness.  Musculoskeletal:        General: No deformity or signs of injury.     Right lower leg: No edema.     Left lower leg: No edema.  Skin:    General: Skin is warm and dry.  Neurological:     General: No focal deficit present.     Mental Status: He is alert. Mental status is at baseline.     Motor: No weakness.  Psychiatric:        Mood and Affect: Mood normal.        Behavior: Behavior normal.        Thought Content: Thought content normal.     ED Results / Procedures / Treatments   Labs (all labs ordered are listed, but only abnormal results are displayed) Labs Reviewed  CBC WITH DIFFERENTIAL/PLATELET - Abnormal; Notable for the following components:      Result Value   RBC 2.80 (*)    Hemoglobin 9.1 (*)    HCT 26.7 (*)    Platelets 433 (*)    Neutro Abs 9.0 (*)    Lymphs Abs 0.3 (*)    Abs Immature Granulocytes 0.09 (*)    All other components within normal limits  BASIC METABOLIC PANEL - Abnormal; Notable for the following components:   Sodium 133 (*)    Chloride 90 (*)    Glucose, Bld 244 (*)    BUN 35 (*)    Creatinine, Ser 6.62 (*)    GFR, Estimated 9 (*)    All other components within normal limits  BRAIN NATRIURETIC PEPTIDE - Abnormal; Notable for the following components:   B Natriuretic Peptide >4,500.0 (*)    All other components within normal limits  TROPONIN I (HIGH SENSITIVITY) - Abnormal; Notable for the following  components:   Troponin I (High  Sensitivity) 31 (*)    All other components within normal limits  SARS CORONAVIRUS 2 (TAT 6-24 HRS)  TROPONIN I (HIGH SENSITIVITY)    EKG EKG Interpretation  Date/Time:  Friday March 21 2020 07:31:16 EST Ventricular Rate:  102 PR Interval:    QRS Duration: 96 QT Interval:  367 QTC Calculation: 479 R Axis:   -23 Text Interpretation: Sinus tachycardia Ventricular premature complex Probable left atrial enlargement LVH with secondary repolarization abnormality Borderline prolonged QT interval Confirmed by Dewaine Conger [phone]) on 03/21/2020 7:52:12 AM   Radiology DG Chest 2 View  Result Date: 03/21/2020 CLINICAL DATA:  Near syncope this morning while having dialysis. EXAM: CHEST - 2 VIEW COMPARISON:  09/08/2015 FINDINGS: Patient has vascular stent overlying the RIGHT axilla. There are bilateral pleural effusions. Significant LEFT basilar and LEFT mid lung zone opacity is consistent with pleural effusion and atelectasis or consolidation. There is no pneumothorax. There is mild interstitial edema. No overt alveolar edema. IMPRESSION: Interval development of bilateral pleural effusions with significant LEFT lung opacity indicating pleural effusion and atelectasis or consolidation. Mild interstitial edema. Electronically Signed   By: Nolon Nations M.D.   On: 03/21/2020 08:34    Procedures Procedures   Medications Ordered in ED Medications - No data to display  ED Course  I have reviewed the triage vital signs and the nursing notes.  Pertinent labs & imaging results that were available during my care of the patient were reviewed by me and considered in my medical decision making (see chart for details).    MDM Rules/Calculators/A&P                          Patient's been having several days to weeks of exertional dyspnea, went to dialysis today as part of his routine, and started getting very short of breath with exertion and lightheaded.  He has a  history of severely depressed ejection fraction on recent nuc med study.  He has a history of end-stage renal disease.  He is on list for transplant but he is needs approval from heart failure specialist first.  Here he has tachycardia with otherwise stable vital signs.  He is not require oxygen normally however on 2 L he is keeping his oxygen up.  I attempted to titrate this down he dropped to the mid 80s.  He has some scattered rales on exam we will get a chest x-ray.  Possible fluid overload may be need dialysis and reassessment versus admission for worsening heart failure symptoms.  Laboratory studies reviewed by myself show declining hemoglobin.  Elevated troponin, elevated BNP, kidney function is at baseline without significant derangements given the patient's end-stage renal disease.  Chest x-ray reviewed by radiology myself shows large left-sided pleural effusion interstitial edema.  Patient will need dialysis.  I consulted the dialysis team and nephrologist they agree.  Patient will also need admission for further management and assessment of the pleural effusions, as well as probable worsening heart failure.  The patient will be admitted to the hospitalist.  For the remainder this patient's care please see inpatient team notes.  I will intervene as needed while the patient remains in the emergency department.   Final Clinical Impression(s) / ED Diagnoses Final diagnoses:  Pleural effusion  ESRD (end stage renal disease) (HCC)  Hypervolemia, unspecified hypervolemia type  Acute hypoxemic respiratory failure ([place])    Rx / DC Orders ED Discharge Orders    None  Breck Coons, MD 03/21/20 1016

## 2020-03-21 NOTE — H&P (Signed)
Date: 03/21/2020               Patient Name:  Kyle [name] MRN: [id number]  DOB: [birth year] Age / Sex: 65 y.o., male   PCP: Patient, No Pcp Per         Medical Service: Internal Medicine Teaching Service         Attending Physician: Dr. Velna Ochs, MD    First Contact: Dr. Collene Gobble Pager: [phone]  Second Contact: Dr. Darrick Meigs Pager: [phone]       After Hours (After 5p/  First Contact Pager: [phone]  weekends / holidays): Second Contact Pager: [phone]   Chief Complaint: dyspnea on exertion  History of Present Illness:   Kyle [name] is a 65yo male with PMH of ESRD on HD, systolic and diastolic HF (EF 123456), TIIDM, secondary hyperparathyriodism, iron deficiency anemia, HTN, and hyperlipidemia presenting with worsening dyspnea on exertion and weakness for the past 2-3 weeks. He has primarily noticed these symptoms right before going to dialysis. Today it was worse and he had dizziness as well. His HD center decided to send him to the ER to be evaluated.  He states he has not had any cough, chest pain, palpitations, headaches or dizziness at other times, sore throat, fever, or chills. He has felt a little colder recently and has noticed that his appetite has been decreased the past few weeks. He has not had any weight loss that he knows of. He has not had any recent illness or sick contacts that he knows of. He denies lower extremity swelling but does feel that he has some abdominal swelling which is where he usually carries fluid when he gains weight. He has not missed any HD sessions. He has been taking his medications daily and is able to name all of them from memory.  He is on the list at [facility] for kidney transplant but was recently told by his cardiologist that he will need to be evaluated by heart failure prior to moving forward as recent lexiscan stress test showed an EF of 30%. He has not yet made an appointment but is planning this soon.   Social:  He lives  in [place] near [place] with his wife and has four children. 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  He stopped smoking in 1984. He very rarely drinks alcohol.  He does not use drugs recreationally.   Family History:   Family History  Problem Relation Age of Onset  . Heart failure Mother   . Diabetes Mother   . Hypertension Mother   . Diabetes Father    Mom - COPD  Maternal Grandmother - Cancer, unknown what type   Meds:  Current Meds  Medication Sig  . aspirin 81 MG tablet Take 81 mg by mouth daily.  . B Complex-C-Folic Acid (RENA-VITE RX) 1 MG TABS Take 1 tablet by mouth daily.  . calcium acetate (PHOSLO) 667 MG capsule Take 667 mg by mouth 3 (three) times daily with meals.  . carvedilol (COREG) 12.5 MG tablet Take 1 tablet (12.5 mg total) by mouth 2 (two) times daily with a meal.  . glimepiride (AMARYL) 2 MG tablet Take 1 tablet (2 mg total) by mouth daily with breakfast.  . lisinopril (PRINIVIL,ZESTRIL) 20 MG tablet TAKE 1 TABLET BY MOUTH DAILY (Patient taking differently: Take 20 mg by mouth daily.)  . nitroGLYCERIN (NITROSTAT) 0.4 MG SL tablet Place 1 tablet (0.4 mg total) under the tongue  every 5 (five) minutes as needed. (Patient taking differently: Place 0.4 mg under the tongue every 5 (five) minutes as needed for chest pain (max 3 doses. Call 911 after taking 2 doses without resolved).)  . rosuvastatin (CRESTOR) 5 MG tablet Take 5 mg by mouth daily.     Allergies: Allergies as of 03/21/2020  . (No Known Allergies)   Past Medical History:  Diagnosis Date  . Abnormal nuclear stress test 09/23/2016  . Acute on chronic combined systolic and diastolic CHF, NYHA class 2 ([place]) 01/15/2014  . Acute respiratory failure with hypoxia ([place])   . Aftercare including intermittent dialysis ([place]) 07/31/2016  . Allergy, unspecified, initial encounter 10/04/2019  . Anaphylactic shock, unspecified, initial encounter 10/04/2019  . Anasarca 08/20/2014  . Anemia in chronic  kidney disease 08/20/2014  . Arteriovenous graft infection ([place]) 25-Sep-2015  . Ascites 05/11/2016  . Bacteremia   . Benign essential HTN 05/11/2016   Formatting of this note might be different from the original. Last Assessment & Plan:  Blood pressure is running low on dialysis days. He is feeling weakAnd systolic ranges in the 123XX123. Decrease Coreg to 12.5 mg twice a day and follow.  Marland Kitchen CAD (coronary artery disease)   . Cardiac arrest ([place])    07/2014-[place] initiated  . Cardiorenal syndrome with renal failure   . CHF (congestive heart failure) ([place])   . Chronic systolic CHF (congestive heart failure) ([place]) 09/23/2016  . Coronary artery disease involving native coronary artery of native heart without angina pectoris 05/11/2016   Formatting of this note might be different from the original. Last Assessment & Plan:  Continue aspirin and statin.  . Diabetes mellitus without complication (HCC)    Type 2  . DM (diabetes mellitus), type 2, uncontrolled, with renal complications ([place]) 99991111  . Embolism due to vascular prosthetic devices, implants and grafts, initial encounter ([place]) 07/31/2016  . Encounter for immunization 07/31/2016  . Encounter for removal of sutures 11/08/2017  . Encounter for screening for cardiovascular disorders 07/31/2016  . Encounter for screening for respiratory tuberculosis 07/31/2016  . ESRD (end stage renal disease) ([place])    [place] M/W/F  . ESRD (end stage renal disease) on dialysis ([place]) 04/22/2016   Formatting of this note might be different from the original. Last Assessment & Plan:  Management per nephrology.  . Essential (primary) hypertension 07/31/2016  . Heart murmur    as a child  . Hyperlipidemia 01/01/2015   Formatting of this note might be different from the original. Last Assessment & Plan:  Continue statin.  Marland Kitchen Hypertension   . Infection and inflammatory reaction due to cardiac device, implant, and graft ([place]) 10/06/2015   Due to [place] (dialysis access graft)   . Iron deficiency anemia, unspecified 07/31/2016  . NSVT (nonsustained ventricular tachycardia) ([place]) 09/02/2014  . Pressure ulcer 08/22/2014  . Pure hypercholesterolemia, unspecified 01/21/2020  . Secondary cardiomyopathy ([place])   . Secondary hyperparathyroidism of renal origin ([place]) 07/31/2016  . Staphylococcus aureus bacteremia 09/25/2015  . Sudden death history (living patient) 04/11/2015  . Thyroid nodule 01/01/2015   Formatting of this note might be different from the original. Last Assessment & Plan:  Patient has a thyroid nodule which needs biopsy. I have instructed him to follow-up with primary care for this issue. I have also instructed him to contact me if there is any delays and I would arrange further evaluation.  . Troponin level elevated 08/20/2014  . Type 2 diabetes mellitus with other diabetic kidney complication (HCC)  07/31/2016  . Type 2 diabetes mellitus without complication ([place]) 123XX123  . Unspecified protein-calorie malnutrition ([place]) 06/19/2018     Review of Systems: A complete ROS was negative except as per HPI.   Physical Exam: Blood pressure 116/75, pulse 87, temperature 98.3 F (36.8 C), temperature source Oral, resp. rate (!) 28, SpO2 93 %.   Constitution: NAD, sitting up in bed  HENT: [place]/AT  Eyes: eom intact, no icterus or injection  Cardio: RRR, III/VI systolic murmur LUSB; JVD to ear, warm extremities Respiratory: left with decreased breath sounds in the base + crackles, right with crackles, otherwise CTA  Abdominal: soft, non-distended, NTTP, normal BS MSK: moving all extremities Neuro: alert and oriented, normal affect, pleasant  Skin: c/d/i    EKG: personally reviewed my interpretation is normal sinus rhythm with   CXR: personally reviewed my interpretation is large left pleural effusion with small right pleural effusion.   Bedside US Large left pleural effusion with consolidation, no septations. Small right pleural effusion.   Study Highlights  Lexiscan sestamibi on 01/15/2020    The left ventricular ejection fraction is severely decreased (<30%).  Nuclear stress EF: 25%.  There was no ST segment deviation noted during stress.  There is a small, non-reversible defect of moderate severity present in the basal anterior and mid anterior location.  There is a large, mostly fixed defect of moderate severity present in the basal inferior, basal inferolateral, mid inferior, mid inferolateral and apical inferior location.  Findings consistent with prior myocardial infarction with very minimal peri-infact ischemia in the inferior LV segments.  This is a high risk study.   Assessment & Plan by Problem: Active Problems:   Pleural effusion on left  Tomoya Delira is a 65yo male with PMH of ESRD on HD, systolic and diastolic HF (EF 123456), TIIDM, secondary hyperparathyriodism, iron deficiency anemia, HTN, and hyperlipidemia presenting with worsening dyspnea on exertion and weakness for the past 2-3 weeks.    Left Pleural Effusion Chronic Systolic and Diastolic HF ESRD on HD  Unclear etiology, I would expect more equilateral bilateral effusions if this were secondary to heart failure, but this still may be possible. BNP is elevated at 4500, which is chronic for him, prior BNP 5009 on 10/2019. Recent myoview in December showed EF of 30% and likely prior MI with basal, inferior and apical cardiac defects. Troponin today 31>42. ECG appears unchanged from prior. He does have JVD extending up the ear. Per chart review he has history of systolic murmur, per one note this was most prominent in apex, on my exam it is loudest in upper sternal border. No B symptoms except for recent loss of appetite which can also occur in worsening heart failure. No symptoms of infection such as cough, vitals are normal except for requiring 2L [place], no leukocytosis.  In the ER nephrology was consulted for dialysis, which is due today and will hopefully help with  some of his respiratory symptoms.   - nephrology consulted, appreciate recommendations and assitance  - obtain thoracentesis with LDH, cell count, culture, protein, albumin, cytology, pH - add albumin and LFTs - check TSH  - cont. Supplemental oxygen to maintain saturation >92% - repeat troponin  - cont. Home asa  - cont. Coreg 12.5 mg bid  - hold lisinopril - will likely need repeat echo, f/u thora with lights criteria  - cont. phoslo - cont. crestor - daily weight, strict I/O   TIIDM On glimepiride at home. Last a1c from 2018 6.6.   -  a1c  - very sensitive SSI   Diet: HH/CM  VTE: heparin  IVF: none Code: full   Dispo: Admit patient to Inpatient with expected length of stay greater than 2 midnights.  SignedMarty Heck, DO 03/21/2020, 12:38 PM  Pager: [phone]

## 2020-03-22 ENCOUNTER — Ambulatory Visit (HOSPITAL_COMMUNITY): Admit: 2020-03-22 | Payer: Medicare (Managed Care) | Admitting: Pulmonary Disease

## 2020-03-22 ENCOUNTER — Inpatient Hospital Stay (HOSPITAL_COMMUNITY): Payer: Medicare (Managed Care)

## 2020-03-22 ENCOUNTER — Encounter (HOSPITAL_COMMUNITY)
Admission: EM | Disposition: A | Discharge status: Home / Self Care | Payer: Self-pay | Source: Home / Self Care | Attending: Internal Medicine

## 2020-03-22 DIAGNOSIS — J90 Pleural effusion, not elsewhere classified: Secondary | ICD-10-CM | POA: Diagnosis not present

## 2020-03-22 DIAGNOSIS — N186 End stage renal disease: Secondary | ICD-10-CM | POA: Diagnosis not present

## 2020-03-22 DIAGNOSIS — J9601 Acute respiratory failure with hypoxia: Secondary | ICD-10-CM | POA: Diagnosis not present

## 2020-03-22 HISTORY — PX: CHEST TUBE INSERTION: SHX231

## 2020-03-22 LAB — CBC WITH DIFFERENTIAL/PLATELET
Abs Immature Granulocytes: 0.07 10*3/uL (ref 0.00–0.07)
Abs Immature Granulocytes: 0.07 10*3/uL (ref 0.00–0.07)
Basophils Absolute: 0 10*3/uL (ref 0.0–0.1)
Basophils Absolute: 0.1 10*3/uL (ref 0.0–0.1)
Basophils Relative: 0 %
Basophils Relative: 1 %
Eosinophils Absolute: 0.1 10*3/uL (ref 0.0–0.5)
Eosinophils Absolute: 0.1 10*3/uL (ref 0.0–0.5)
Eosinophils Relative: 1 %
Eosinophils Relative: 1 %
HCT: 25.3 % — ABNORMAL LOW (ref 39.0–52.0)
HCT: 28.5 % — ABNORMAL LOW (ref 39.0–52.0)
Hemoglobin: 8.8 g/dL — ABNORMAL LOW (ref 13.0–17.0)
Hemoglobin: 9.9 g/dL — ABNORMAL LOW (ref 13.0–17.0)
Immature Granulocytes: 1 %
Immature Granulocytes: 1 %
Lymphocytes Relative: 4 %
Lymphocytes Relative: 4 %
Lymphs Abs: 0.3 10*3/uL — ABNORMAL LOW (ref 0.7–4.0)
Lymphs Abs: 0.4 10*3/uL — ABNORMAL LOW (ref 0.7–4.0)
MCH: 32.6 pg (ref 26.0–34.0)
MCH: 32.8 pg (ref 26.0–34.0)
MCHC: 34.8 g/dL (ref 30.0–36.0)
MCHC: 34.7 g/dL (ref 30.0–36.0)
MCV: 93.7 fL (ref 80.0–100.0)
MCV: 94.4 fL (ref 80.0–100.0)
Monocytes Absolute: 0.7 10*3/uL (ref 0.1–1.0)
Monocytes Absolute: 0.7 10*3/uL (ref 0.1–1.0)
Monocytes Relative: 8 %
Monocytes Relative: 8 %
Neutro Abs: 7.7 10*3/uL (ref 1.7–7.7)
Neutro Abs: 8.4 10*3/uL — ABNORMAL HIGH (ref 1.7–7.7)
Neutrophils Relative %: 86 %
Neutrophils Relative %: 85 %
Platelets: 412 10*3/uL — ABNORMAL HIGH (ref 150–400)
Platelets: 548 10*3/uL — ABNORMAL HIGH (ref 150–400)
RBC: 2.7 MIL/uL — ABNORMAL LOW (ref 4.22–5.81)
RBC: 3.02 MIL/uL — ABNORMAL LOW (ref 4.22–5.81)
RDW: 15.1 % (ref 11.5–15.5)
RDW: 15.4 % (ref 11.5–15.5)
WBC: 8.9 10*3/uL (ref 4.0–10.5)
WBC: 9.7 10*3/uL (ref 4.0–10.5)
nRBC: 0 % (ref 0.0–0.2)
nRBC: 0 % (ref 0.0–0.2)

## 2020-03-22 LAB — COMPREHENSIVE METABOLIC PANEL
ALT: 23 U/L (ref 0–44)
AST: 22 U/L (ref 15–41)
Albumin: 2 g/dL — ABNORMAL LOW (ref 3.5–5.0)
Alkaline Phosphatase: 131 U/L — ABNORMAL HIGH (ref 38–126)
Anion gap: 9 (ref 5–15)
BUN: 23 mg/dL (ref 8–23)
CO2: 28 mmol/L (ref 22–32)
Calcium: 8.8 mg/dL — ABNORMAL LOW (ref 8.9–10.3)
Chloride: 94 mmol/L — ABNORMAL LOW (ref 98–111)
Creatinine, Ser: 5.03 mg/dL — ABNORMAL HIGH (ref 0.61–1.24)
GFR, Estimated: 12 mL/min — ABNORMAL LOW (ref >60)
Glucose, Bld: 241 mg/dL — ABNORMAL HIGH (ref 70–99)
Potassium: 3.9 mmol/L (ref 3.5–5.1)
Sodium: 131 mmol/L — ABNORMAL LOW (ref 135–145)
Total Bilirubin: 0.9 mg/dL (ref 0.3–1.2)
Total Protein: 6.7 g/dL (ref 6.5–8.1)

## 2020-03-22 LAB — IRON AND TIBC
Iron: 25 ug/dL — ABNORMAL LOW (ref 45–182)
Saturation Ratios: 21 % (ref 17.9–39.5)
TIBC: 119 ug/dL — ABNORMAL LOW (ref 250–450)
UIBC: 94 ug/dL

## 2020-03-22 LAB — GLUCOSE, CAPILLARY
Glucose-Capillary: 184 mg/dL — ABNORMAL HIGH (ref 70–99)
Glucose-Capillary: 155 mg/dL — ABNORMAL HIGH (ref 70–99)
Glucose-Capillary: 63 mg/dL — ABNORMAL LOW (ref 70–99)
Glucose-Capillary: 75 mg/dL (ref 70–99)
Glucose-Capillary: 162 mg/dL — ABNORMAL HIGH (ref 70–99)

## 2020-03-22 LAB — BODY FLUID CELL COUNT WITH DIFFERENTIAL
Eos, Fluid: 1 %
Lymphs, Fluid: 1 %
Monocyte-Macrophage-Serous Fluid: 3 % — ABNORMAL LOW (ref 50–90)
Neutrophil Count, Fluid: 95 % — ABNORMAL HIGH (ref 0–25)
Total Nucleated Cell Count, Fluid: 13200 cu mm — ABNORMAL HIGH (ref 0–1000)

## 2020-03-22 LAB — PROTEIN, PLEURAL OR PERITONEAL FLUID: Total protein, fluid: 3 g/dL

## 2020-03-22 LAB — LACTATE DEHYDROGENASE, PLEURAL OR PERITONEAL FLUID: LD, Fluid: 6931 U/L — ABNORMAL HIGH (ref 3–23)

## 2020-03-22 LAB — MAGNESIUM: Magnesium: 2 mg/dL (ref 1.7–2.4)

## 2020-03-22 LAB — FERRITIN: Ferritin: 2156 ng/mL — ABNORMAL HIGH (ref 24–336)

## 2020-03-22 SURGERY — INSERTION, PLEURAL DRAINAGE CATHETER
Anesthesia: Topical | Laterality: Left

## 2020-03-22 MED ORDER — STERILE WATER FOR INJECTION IJ SOLN
5.0000 mg | Freq: Once | RESPIRATORY_TRACT | Status: AC
  Administered 2020-03-22: 5 mg via INTRAPLEURAL
  Filled 2020-03-22 (×2): qty 5

## 2020-03-22 MED ORDER — INSULIN ASPART 100 UNIT/ML ~~LOC~~ SOLN
0.0000 [IU] | Freq: Three times a day (TID) | SUBCUTANEOUS | Status: DC
  Administered 2020-03-22 (×2): 3 [IU] via SUBCUTANEOUS
  Administered 2020-03-24: 2 [IU] via SUBCUTANEOUS
  Administered 2020-03-25: 3 [IU] via SUBCUTANEOUS
  Administered 2020-03-25: 5 [IU] via SUBCUTANEOUS
  Administered 2020-03-27: 3 [IU] via SUBCUTANEOUS
  Administered 2020-03-27: 2 [IU] via SUBCUTANEOUS

## 2020-03-22 MED ORDER — OXYCODONE-ACETAMINOPHEN 5-325 MG PO TABS: 1.0000 | ORAL_TABLET | Freq: Four times a day (QID) | ORAL | Status: DC | PRN

## 2020-03-22 MED ORDER — SODIUM CHLORIDE (PF) 0.9 % IJ SOLN
10.0000 mg | Freq: Once | INTRAMUSCULAR | Status: AC
  Administered 2020-03-22: 10 mg via INTRAPLEURAL
  Filled 2020-03-22: qty 10

## 2020-03-22 MED ORDER — OXYCODONE-ACETAMINOPHEN 5-325 MG PO TABS
1.0000 | ORAL_TABLET | Freq: Four times a day (QID) | ORAL | Status: AC | PRN
  Administered 2020-03-22 – 2020-03-23 (×4): 1 via ORAL
  Filled 2020-03-22 (×4): qty 1

## 2020-03-22 MED ORDER — SODIUM CHLORIDE (PF) 0.9 % IJ SOLN: 10.0000 mg | Freq: Once | INTRAMUSCULAR | Status: DC

## 2020-03-22 MED ORDER — INSULIN GLARGINE 100 UNIT/ML ~~LOC~~ SOLN
5.0000 [IU] | Freq: Every day | SUBCUTANEOUS | Status: DC
  Administered 2020-03-22: 5 [IU] via SUBCUTANEOUS
  Filled 2020-03-22 (×3): qty 0.05

## 2020-03-22 MED ORDER — STERILE WATER FOR INJECTION IJ SOLN: 5.0000 mg | Freq: Once | RESPIRATORY_TRACT | Status: DC

## 2020-03-22 MED ORDER — SODIUM CHLORIDE 0.9% FLUSH
10.0000 mL | Freq: Three times a day (TID) | INTRAVENOUS | Status: DC
  Administered 2020-03-22 – 2020-03-24 (×5): 10 mL

## 2020-03-22 MED ORDER — IOHEXOL 300 MG/ML  SOLN
75.0000 mL | Freq: Once | INTRAMUSCULAR | Status: AC | PRN
  Administered 2020-03-22: 75 mL via INTRAVENOUS

## 2020-03-22 MED ORDER — LIDOCAINE HCL 2 % IJ SOLN
INTRAMUSCULAR | Status: AC
  Filled 2020-03-22: qty 20

## 2020-03-22 NOTE — Procedures (Signed)
Pleural Fibrinolytic Administration Procedure Note  Kyle [name]  [id number]  11-May-1955  Date:03/22/20  Time:6:47 PM   Provider Performing:Brent Yumiko Alkins   Procedure: Pleural Fibrinolysis Initial day [phone])  Indication(s) Fibrinolysis of complicated pleural effusion  Consent Risks of the procedure as well as the alternatives and risks of each were explained to the patient and/or caregiver.  Consent for the procedure was obtained.   Anesthesia None   Time Out Verified patient identification, verified procedure, site/side was marked, verified correct patient position, special equipment/implants available, medications/allergies/relevant history reviewed, required imaging and test results available.   Sterile Technique Hand hygiene, gloves   Procedure Description Existing pleural catheter was cleaned and accessed in sterile manner.  '10mg'$  of tPA in 30cc of saline and '5mg'$  of dornase in 30cc of sterile water were injected into pleural space using existing pleural catheter.  Catheter will be clamped for 1 hour and then placed back to suction.   Complications/Tolerance None; patient tolerated the procedure well.   EBL None   Specimen(s) None  Kyle Awkward, MD [place] [facility] Pager: [phone] Cell: [phone] If no response, please call [phone] until 7pm After 7:00 pm call Elink  [phone]

## 2020-03-22 NOTE — Progress Notes (Signed)
[facility] Progress Note  Subjective: 3.5 L off w/ HD yest , no BP drops or side effects. Breathing a bit better today.   Vitals:   03/21/20 1905 03/21/20 1915 03/21/20 2010 03/22/20 0643  BP: (!) 153/88 138/80 128/73 125/64  Pulse: 83 81 84 84  Resp: '20 19 18 18  '$ Temp:  97.8 F (36.6 C) 98.5 F (36.9 C) 98.6 F (37 C)  TempSrc:  Oral    SpO2:  100% 100% 91%  Weight:      Height:        Exam: Gen alert, no distress  +JVD mid neck at 45deg Chest dec'd at bases, o/w clear bilat RRR no MRG Abd soft ntnd no mass or ascites +bs Ext no LE or UE edema Neuro is alert, Ox 3 , nf   LUE AVF+bruit    Home meds:  - asa 81/ crestor 5 / lisinopril 20 qd/ coreg 12.5 bid/ sl ntg prn  - phoslo 1 ac tid/ amaryl '2mg'$  qam  - prn's/ vitamins/ supplements     OP HD: AF MWF  4h  450/500  75.5kg  2/2.25 bath  LUE AVF  Hep 7000  -mircera 30 q4wk, last 2/7  - hect 3ug tiw   Assessment/ Plan: 1. DOE/ pleural effusions - suspected due to volume overload from lean body wt loss.  BP's high and Not grossly vol overloaded on exam, but + large pleural effusions by CXR.  Questionable IS edema on CXR. 3.5 L removed yesterday w/o BP drop, will plan extra HD today for further vol removal. Then will get f/u CXR either tonight or in am tomorrow.  2. HTN/ vol - cont coreg/ acei. Lower vol as tolerated. BP coming down. Hold BP meds for now w/ large UF goals.  3. H/o CM - last echo showed recovery to 60% EF in 2017 4. ESRD - usual HD MWF. HD yest here, HD again today.  5. 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 Anemia ckd - last esa 2/7, not due for another 10 days. Watch Hb, tx prn        Kyle [name] 03/22/2020, 9:54 AM   Recent Labs  Lab 03/21/20 0841 03/22/20 0222  K 3.9 3.9  BUN 35* 23  CREATININE 6.62* 5.03*  CALCIUM 9.2 8.8*  HGB 9.1* 8.8*   Inpatient medications: . aspirin EC  81 mg Oral Daily  . calcium acetate  667 mg Oral TID WC   . carvedilol  12.5 mg Oral BID WC  . Chlorhexidine Gluconate Cloth  6 each Topical Q0600  . doxercalciferol  3 mcg Intravenous Q M,W,F-HD  . heparin  5,000 Units Subcutaneous Q8H  . insulin aspart  0-15 Units Subcutaneous TID WC  . insulin glargine  5 Units Subcutaneous QHS  . rosuvastatin  5 mg Oral Daily    acetaminophen **OR** acetaminophen

## 2020-03-22 NOTE — Op Note (Signed)
Chest Tube Insertion Procedure Note  Indications:  Clinically significant Empyema  Pre-operative Diagnosis: Empyema  Post-operative Diagnosis: Empyema  Procedure Details  Informed consent was obtained for the procedure, including sedation.  Risks of lung perforation, hemorrhage, arrhythmia, and adverse drug reaction were discussed.   After sterile skin prep, using standard technique, a 14 French tube was placed in the left lateral 6th rib space.  Findings: 200 ml of purulent fluid obtained  Estimated Blood Loss:  Minimal         Specimens:  Sent purulent fluid              Complications:  None; patient tolerated the procedure well.         Disposition: Progressive Care         Condition: stable  Attending Attestation: I performed the procedure.  [name], MD [place] [facility] Pager: [phone] Cell: [phone] If no response, please call [phone] until 7pm After 7:00 pm call Elink  [phone]

## 2020-03-22 NOTE — Progress Notes (Signed)
CT unable to be completed overnight due to coordination with HD.

## 2020-03-22 NOTE — Progress Notes (Signed)
Subjective/Interm history: Lung [place] by IR yesterday afternoon showed loculations and collection of thick vs congealed fluid not ammenable to IR drainage with thoracentesis. I attempted to order a chest CT with contrast for further evaluation however, due to ESRD, this was unable to be coordinated prior to HD.   Otherwise, no significant overnight events.  He has no complaints this morning. Notes that his breathing is better.  Reviewed IR's ultrasound findings with him and need for further imaging to better assess. Discussed that we will likely need to get pulm on board once imaging is complete.   Objective:  Vital signs in last 24 hours: Vitals:   03/21/20 1905 03/21/20 1915 03/21/20 2010 03/22/20 0643  BP: (!) 153/88 138/80 128/73 125/64  Pulse: 83 81 84 84  Resp: '20 19 18 18  '$ Temp:  97.8 F (36.6 C) 98.5 F (36.9 C) 98.6 F (37 C)  TempSrc:  Oral    SpO2:  100% 100% 91%  Weight:      Height:       General: well appearing Cardiac: RRR, no LE edema, extremities warm, AVG present on LUE Pulm: breathing comfortably on room air, lung sounds diminished on the left, otherwise clear  Assessment/Plan:  Active Problems:   Pleural effusion   Acute hypoxemic respiratory failure (HCC)   Biventricular heart failure, NYHA class 3 (HCC)   Pleural effusion on left  Acute hypoxic respiratory failure (resolved) secondary to hypervolemia. Now on RA  Bilateral pleural effusions, L>>R Loculated left pleural effusion. Unable to be drained by IR yesterday. Appearance noted by IR is concerning and ddx remains broad at this time. Considered HF related however unilateral nature and IR findings make this less likely. Malignancy can not be ruled out at this point. Infection considered but less likely in the absence of systemic signs. Suspect the effusions have been slowly accumulating for a while given his decent compensation.  Plan -Chest CT with contrast ordered for this morning -depending on  results, will likely need to get pulm involved for chest tube placement  Chronic combined heart failure NYHA class 3. He had an initial office visit scheduled with cardiology on the day of admission.  Initial heart failure onset was in 2016 s/p cardiac arrest. Fortunately, EF recovered and was normal until echo from 11/2019 showed EF 30-35% with global LV hypokinesis. Heart cath from 2018 showed non-obstructive disease with 75% stenosis in the prox RCA.  NM stress testing from 12/2019 shows small non-reversable defect of moderate severity in the basal anterior and mid anterior regions as well as a large mostly fixed defect of moderate severity in the basal inferior, basal inferolateral, mid inferior, mid inferolateral, and apical inferior regions. No ST changes noted during test. He also underwent a bx to evalute for amyloid in 2016 which was negative. Hx of CAD, HLD Elevated troponins. Serial troponins without significant increase. denies chest pain Hx of hypertension Plan -continue aspirin, statin -tele monitoring -will plan to have him follow up with [facility] at [facility] as previously planned  ESRD on HD MWF Secondary Hyperparathyroidism Mild Hyponatremia. Corrected Na 133 -management per nephrology  Normocytic anemia. Suspect component of anemia of CKD. Thrombocytosis (mild).  -check iron panel -continue to monitor daily  Uncontrolled Type 2 Diabetes Mellitus. A1C 9.7 on admission. CBGs >200 overnight. -start lantus 5U qHS -moderate SSI -adjust as needed   Prior to Admission Living Arrangement: home Anticipated Discharge Location: home Barriers to Discharge: ongoing evaluation and treatment Dispo: 3-5 days  Ambar Raphael  Darrick Meigs, MD Internal Medicine Resident PGY-2 [facility] Pager: [phone] 03/22/2020 7:39 AM    After 5pm on weekdays and 1pm on weekends: On Call pager [phone]

## 2020-03-22 NOTE — Consult Note (Addendum)
NAME:  Kyle [name], MRN:  [id number], DOB:  [birth year], LOS: 1 ADMISSION DATE:  03/21/2020, CONSULTATION DATE:  03/22/20 REFERRING MD:  Kyle [name] - IMTS , CHIEF COMPLAINT: Complex L sided pleural effusion   Brief History:  65 yo SOB x several weeks. Found to have loculated L pleural effusion.  CCM consulted   History of Present Illness:  65 yo M PMH ESRD on HD, biventricular heart failure, HTN, HLD, DM2, admitted to IMTS 2/25 with SOB. SOB began several weeks ago, associated malaise, loss of appetite. Imaging revealed L sided pleural effusion.   Initially plan was for IR to perform thora however due to thick appearing fluid on limited US by IR, a CT chest w contrast was obtained, revealing a complex, loculated L pleural effusion which would not be amenable to thoracentesis. Patient actually states that his transplant nephrologist at [facility] first identified a loculated appearing effusion on CXR 10/14, with plan to incr UF at HD.  Pt does not endorse cough, recent illness, PNA, cancer, sick contacts, fever, chills, nightsweats. Has prior smoking history, but quit in 1980s.   PCCM consulted for evaluation and management of pleural effusion.  Past Medical History:  ESRD on HD DM2 HTN HLD BiV HF CAD Pulmonary hypertension   Significant Hospital Events:  2/25 admitted to IMTS. L Pleural effusion not amenable to thora with IR due to loculated appearance 2/26 CT chest confirms complex, loculated effusion. PCCM consult for possible chest tube, management of effusion   Consults:  IR PCCM  Procedures:  2/26 pigtail (planned)   Significant Diagnostic Tests:  2/26 CT chest w con> Moderate L pleural effusion with pleural thickening, similar in appearance to Ct in 2021. Trace R effusion. Bilateral consolidative opacities (suspected rounded atelectasis). Cardiomegaly, small pericardial effusion. 48m focal pulm nodule. Three vessel CAD.   Micro Data:  2/25 SARS Cov2> neg   Antimicrobials:     Interim History / Subjective:  Discussed CT findings with pt and his wife, Kyle [name]  Discussed utility of chest tube instead of thoracentesis, as well as likely use of tpa/dornase. Dr. IValeta Harmsdiscussed possibility that lung many not re-expand, and pleural fluid analysis.   Patient agreeable to proceed with procedure today   Objective   Blood pressure 127/65, pulse 82, temperature 98.2 F (36.8 C), resp. rate 18, height 6' (1.829 m), weight 77.8 kg, SpO2 94 %.        Intake/Output Summary (Last 24 hours) at 03/22/2020 1419 Last data filed at 03/21/2020 1930 Gross per 24 hour  Intake --  Output 3500 ml  Net -3500 ml   Filed Weights   03/21/20 1321 03/21/20 1412  Weight: 79.4 kg 77.8 kg    Examination: General: Chronically ill appearing wdwn older adult M HENT: NCAT  Pink mm anicteric sclera Lungs: Symmetrical chest expansion, even unlabored respirations  Cardiovascular: rrr cap refill brisk  Abdomen: soft ndnt Extremities: No acute joint deformity, no cyanosis or clubbing Neuro: AAOx4 following commands  GU: defer Psych: calm, cooperative, appropriate for age and situation   Resolved Hospital Problem list     Assessment & Plan:   Loculated L sided pleural effusion -chronic, at least in part, as this was identified in Oct 2021 by transplant nephrologist. Likely in setting of ESRD, heart failure, but need to r/o other etiologies P -Endo for Pigtail placement -pleural fluid analysis, cx -CXR after -Likely will need tpa/dornase -Chest tube to suction -routine chest tube care -AM CXR   Other active problems: ESRD CAD  Combined heart failure HTN DM2 Hyponatremia, mild  Best practice (evaluated daily)  Diet: -- Pain/Anxiety/Delirium protocol (if indicated): prn VAP protocol (if indicated): na DVT prophylaxis: SQH GI prophylaxis: na Glucose control: basal, SSI Mobility: as tolerated Disposition: tele  Goals of Care:  Last date of multidisciplinary goals of  care discussion: -- Family and staff present: -- Summary of discussion:  Follow up goals of care discussion due: -- Code Status: Full  Labs   CBC: Recent Labs  Lab 03/21/20 0841 03/22/20 0222  WBC 10.3 8.9  NEUTROABS 9.0* 7.7  HGB 9.1* 8.8*  HCT 26.7* 25.3*  MCV 95.4 93.7  PLT 433* 412*    Basic Metabolic Panel: Recent Labs  Lab 03/21/20 0841 03/22/20 0222  NA 133* 131*  K 3.9 3.9  CL 90* 94*  CO2 29 28  GLUCOSE 244* 241*  BUN 35* 23  CREATININE 6.62* 5.03*  CALCIUM 9.2 8.8*  MG  --  2.0   GFR: Estimated Creatinine Clearance: 16.3 mL/min (A) (by C-G formula based on SCr of 5.03 mg/dL (H)). Recent Labs  Lab 03/21/20 0841 03/22/20 0222  WBC 10.3 8.9    Liver Function Tests: Recent Labs  Lab 03/21/20 1213 03/22/20 0222  AST 27 22  ALT 25 23  ALKPHOS 137* 131*  BILITOT 0.9 0.9  PROT 6.8 6.7  ALBUMIN 2.1* 2.0*   No results for input(s): LIPASE, AMYLASE in the last 168 hours. No results for input(s): AMMONIA in the last 168 hours.  ABG    Component Value Date/Time   PHART 7.428 09/04/2014 1040   PCO2ART 41.0 09/04/2014 1040   PO2ART 72.0 (L) 09/04/2014 1040   HCO3 26.9 (H) 09/04/2014 1043   TCO2 31 12/04/2015 0646   ACIDBASEDEF 1.0 08/25/2014 1045   O2SAT 69.0 09/04/2014 1043     Coagulation Profile: No results for input(s): INR, PROTIME in the last 168 hours.  Cardiac Enzymes: No results for input(s): CKTOTAL, CKMB, CKMBINDEX, TROPONINI in the last 168 hours.  HbA1C: Hgb A1c MFr Bld  Date/Time Value Ref Range Status  03/21/2020 11:44 AM 9.7 (H) 4.8 - 5.6 % Final    Comment:    (NOTE) Pre diabetes:          5.7%-6.4%  Diabetes:              >6.4%  Glycemic control for   <7.0% adults with diabetes   08/20/2014 08:58 PM 7.6 (H) 4.8 - 5.6 % Final    Comment:    (NOTE)         Pre-diabetes: 5.7 - 6.4         Diabetes: >6.4         Glycemic control for adults with diabetes: <7.0     CBG: Recent Labs  Lab 03/21/20 2017  03/22/20 0810 03/22/20 1220  GLUCAP 130* 184* 155*    Review of Systems:    + SOB, + malaise + weakness + DOE + loss of appetite   -cough -URI sx -chest discomfort -edema -n/v/d -fever - chills -night sweats   Past Medical History:  He,  has a past medical history of Abnormal nuclear stress test (09/23/2016), Acute on chronic combined systolic and diastolic CHF, NYHA class 2 ([place]) (01/15/2014), Acute respiratory failure with hypoxia [facility]), Aftercare including intermittent dialysis ([place]) (07/31/2016), Allergy, unspecified, initial encounter (10/04/2019), Anaphylactic shock, unspecified, initial encounter (10/04/2019), Anasarca (08/20/2014), Anemia in chronic kidney disease (08/20/2014), Arteriovenous graft infection ([place]) (08/29/2015), Ascites (05/11/2016), Bacteremia, Benign essential HTN (05/11/2016), CAD (coronary  artery disease), Cardiac arrest [facility]), Cardiorenal syndrome with renal failure, CHF (congestive heart failure) ([place]), Chronic systolic CHF (congestive heart failure) ([place]) (09/23/2016), Coronary artery disease involving native coronary artery of native heart without angina pectoris (05/11/2016), Diabetes mellitus without complication ([place]), DM (diabetes mellitus), type 2, uncontrolled, with renal complications ([place]) (06/29/5407), Embolism due to vascular prosthetic devices, implants and grafts, initial encounter ([place]) (07/31/2016), Encounter for immunization (07/31/2016), Encounter for removal of sutures (11/08/2017), Encounter for screening for cardiovascular disorders (07/31/2016), Encounter for screening for respiratory tuberculosis (07/31/2016), ESRD (end stage renal disease) ([place]), ESRD (end stage renal disease) on dialysis ([place]) (04/22/2016), Essential (primary) hypertension (07/31/2016), Heart murmur, Hyperlipidemia (01/01/2015), Hypertension, Infection and inflammatory reaction due to cardiac device, implant, and graft ([place]) (10/06/2015), Iron deficiency anemia, unspecified (07/31/2016), NSVT (nonsustained  ventricular tachycardia) ([place]) (09/02/2014), Pressure ulcer (08/22/2014), Pure hypercholesterolemia, unspecified (01/21/2020), Secondary cardiomyopathy ([place]), Secondary hyperparathyroidism of renal origin ([employer]) (07/31/2016), Staphylococcus aureus bacteremia (09/15/15), Sudden death history (living patient) (04/11/2015), Thyroid nodule (01/01/2015), Troponin level elevated (08/20/2014), Type 2 diabetes mellitus with other diabetic kidney complication ([place]) (08/25/1912), Type 2 diabetes mellitus without complication ([place]) (7/82/9562), and Unspecified protein-calorie malnutrition ([place]) (06/19/2018).   Surgical History:   Past Surgical History:  Procedure Laterality Date  . AV FISTULA PLACEMENT Left 09/11/2014   Procedure: INSERTION OF LEFT ARM 6MM ARTERIOVENOUS GORTEX GRAFT ;  Surgeon: Elam Dutch, MD;  Location: [place];  Service: Vascular;  Laterality: Left;  . AV FISTULA PLACEMENT Right 10/14/2015   Procedure: ARTERIOVENOUS (AV) FISTULA CREATION;  Surgeon: Angelia Mould, MD;  Location: [place];  Service: Vascular;  Laterality: Right;  . AV FISTULA PLACEMENT Left 07/11/2018   Procedure: INSERTION OF ARTERIOVENOUS (AV) GORE-TEX STRETCH VASCULAR GRAFT LEFT ARM;  Surgeon: Angelia Mould, MD;  Location: [place];  Service: Vascular;  Laterality: Left;  . CARDIAC CATHETERIZATION N/A 09/04/2014   Procedure: Right/Left Heart Cath and Coronary Angiography;  Surgeon: Peter M Martinique, MD;  Location: [place] CV LAB;  Service: Cardiovascular;  Laterality: N/A;  . EXTERNAL EAR SURGERY    . FINGER SURGERY     for infection  . INSERTION OF DIALYSIS CATHETER N/A 08/21/2014   Procedure: INSERTION OF DIALYSIS CATHETER RIGHT INTERNAL JUGULAR VEIN;  Surgeon: Conrad Alcan Border, MD;  Location: [place];  Service: Vascular;  Laterality: N/A;  MAC to General  . INSERTION OF DIALYSIS CATHETER N/A 09/08/2015   Procedure: INSERTION OF DIALYSIS CATHETER RIGHT INTERNAL JUGULAR;  Surgeon: Rosetta Posner, MD;  Location: [facility] OR;  Service:  Vascular;  Laterality: N/A;  . LEFT HEART CATH AND CORONARY ANGIOGRAPHY N/A 09/23/2016   Procedure: LEFT HEART CATH AND CORONARY ANGIOGRAPHY;  Surgeon: Martinique, Peter M, MD;  Location: [place] CV LAB;  Service: Cardiovascular;  Laterality: N/A;  . PATCH ANGIOPLASTY Right 12/04/2015   Procedure: WITH 1 X 6 CM [place] ANGIOPLASTY TO RIGHT ARM BRACHIO-CEPHALIC FISTULA;  Surgeon: Conrad Elko, MD;  Location: [place];  Service: Vascular;  Laterality: Right;  . PERIPHERAL VASCULAR CATHETERIZATION N/A 12/04/2015   Procedure: Fistulagram - Right Arm;  Surgeon: Conrad Milan, MD;  Location: [place] [facility];  Service: Cardiovascular;  Laterality: N/A;  . REVISION OF ARTERIOVENOUS GORETEX GRAFT Left 08/31/2015   Procedure: REVISION OF LEFT ARTERIOVENOUS GORETEX GRAFT USING  AN INTERPOSITION  4-7MM GORTEX GRAFT WITH REMOVAL OF INFECTED STENT;  INTRAOPERATIVE ARTERIOGRAM TIMES ONE.;  Surgeon: Angelia Mould, MD;  Location: [place];  Service: Vascular;  Laterality: Left;  . REVISON OF  ARTERIOVENOUS FISTULA Right 12/04/2015   Procedure: REVISON OF RIGHT BRACHIO--CEPHALIC  ARTERIOVENOUS FISTULA;  Surgeon: Conrad [place] Dam, MD;  Location: [place];  Service: Vascular;  Laterality: Right;  . TONSILLECTOMY       Social History:   reports that he quit smoking about 37 years ago. His smoking use included cigarettes. He has never used smokeless tobacco. He reports that he does not drink alcohol and does not use drugs.   Family History:  His family history includes Diabetes in his father and mother; Heart failure in his mother; Hypertension in his mother.   Allergies No Known Allergies   Home Medications  Prior to Admission medications   Medication Sig Start Date End Date Taking? Authorizing Provider  aspirin 81 MG tablet Take 81 mg by mouth daily.   Yes [provider]  B Complex-C-Folic Acid (RENA-VITE RX) 1 MG TABS Take 1 tablet by mouth daily. 05/29/15  Yes [provider]   calcium acetate (PHOSLO) 667 MG capsule Take 667 mg by mouth 3 (three) times daily with meals. 12/28/19  Yes [provider]  carvedilol (COREG) 12.5 MG tablet Take 1 tablet (12.5 mg total) by mouth 2 (two) times daily with a meal. 07/09/16  Yes Hilty, Nadean Corwin, MD  glimepiride (AMARYL) 2 MG tablet Take 1 tablet (2 mg total) by mouth daily with breakfast. 01/17/14  Yes Barton Dubois, MD  lisinopril (PRINIVIL,ZESTRIL) 20 MG tablet TAKE 1 TABLET BY MOUTH DAILY Patient taking differently: Take 20 mg by mouth daily. 09/28/17  Yes Lelon Perla, MD  nitroGLYCERIN (NITROSTAT) 0.4 MG SL tablet Place 1 tablet (0.4 mg total) under the tongue every 5 (five) minutes as needed. Patient taking differently: Place 0.4 mg under the tongue every 5 (five) minutes as needed for chest pain (max 3 doses. Call 911 after taking 2 doses without resolved). 01/01/20 03/31/20 Yes Revankar, Reita Cliche, MD  rosuvastatin (CRESTOR) 5 MG tablet Take 5 mg by mouth daily.   Yes [provider]     CCT: n/a  Eliseo Gum MSN, AGACNP-BC [place] [phone] If no answer, [phone] 03/22/2020, 4:43 PM    PCCM Attending:    65 yo M, PMH ESRD on HD, BiV failure, HTN, HLD, DM2 with loculated left sided pleural effusion.   [place] of left chest with thick, loculations present.   BP 138/66   Pulse 80   Temp 98.2 F (36.8 C)   Resp (!) 25   Ht 6' (1.829 m)   Wt 77.8 kg   SpO2 98%   BMI 23.26 kg/m   Gen: Elderly male resting in bed HEENT: Tracking appropriately NCAT Heart: Regular rhythm S1-S2 Lungs: Diminished left chest, no crackles no wheeze Abdomen: Nontender nondistended  Labs: Reviewed Chest x-ray: Left loculated effusion Ultrasound left chest with significant debris present.  Assessment: Loculated left-sided pleural effusion ESRD on IHD CAD Biventricular failure Hypertension Type 2 diabetes  Plan: Endoscopy for pigtail catheter placement Pleural fluid  analysis pending Needs TPA and dornase We will place chest tube to suction.  Garner Nash, DO [facility] 03/22/2020 5:15 PM

## 2020-03-23 ENCOUNTER — Inpatient Hospital Stay (HOSPITAL_COMMUNITY): Payer: Medicare (Managed Care)

## 2020-03-23 DIAGNOSIS — I5082 Biventricular heart failure: Secondary | ICD-10-CM

## 2020-03-23 DIAGNOSIS — J90 Pleural effusion, not elsewhere classified: Secondary | ICD-10-CM | POA: Diagnosis not present

## 2020-03-23 DIAGNOSIS — J9601 Acute respiratory failure with hypoxia: Secondary | ICD-10-CM | POA: Diagnosis not present

## 2020-03-23 LAB — BASIC METABOLIC PANEL
Anion gap: 13 (ref 5–15)
BUN: 36 mg/dL — ABNORMAL HIGH (ref 8–23)
CO2: 27 mmol/L (ref 22–32)
Calcium: 9.2 mg/dL (ref 8.9–10.3)
Chloride: 93 mmol/L — ABNORMAL LOW (ref 98–111)
Creatinine, Ser: 6.65 mg/dL — ABNORMAL HIGH (ref 0.61–1.24)
GFR, Estimated: 9 mL/min — ABNORMAL LOW (ref >60)
Glucose, Bld: 102 mg/dL — ABNORMAL HIGH (ref 70–99)
Potassium: 4.2 mmol/L (ref 3.5–5.1)
Sodium: 133 mmol/L — ABNORMAL LOW (ref 135–145)

## 2020-03-23 LAB — CBC
HCT: 28.1 % — ABNORMAL LOW (ref 39.0–52.0)
Hemoglobin: 9.6 g/dL — ABNORMAL LOW (ref 13.0–17.0)
MCH: 32 pg (ref 26.0–34.0)
MCHC: 34.2 g/dL (ref 30.0–36.0)
MCV: 93.7 fL (ref 80.0–100.0)
Platelets: 406 10*3/uL — ABNORMAL HIGH (ref 150–400)
RBC: 3 MIL/uL — ABNORMAL LOW (ref 4.22–5.81)
RDW: 15.4 % (ref 11.5–15.5)
WBC: 8.5 10*3/uL (ref 4.0–10.5)
nRBC: 0 % (ref 0.0–0.2)

## 2020-03-23 LAB — TRIGLYCERIDES, BODY FLUIDS: Triglycerides, Fluid: 29 mg/dL

## 2020-03-23 LAB — GLUCOSE, CAPILLARY
Glucose-Capillary: 80 mg/dL (ref 70–99)
Glucose-Capillary: 94 mg/dL (ref 70–99)
Glucose-Capillary: 81 mg/dL (ref 70–99)

## 2020-03-23 MED ORDER — DARBEPOETIN ALFA 40 MCG/0.4ML IJ SOSY: 40.0000 ug | PREFILLED_SYRINGE | INTRAMUSCULAR | Status: DC

## 2020-03-23 MED ORDER — OXYCODONE-ACETAMINOPHEN 5-325 MG PO TABS
1.0000 | ORAL_TABLET | Freq: Four times a day (QID) | ORAL | Status: AC | PRN
  Administered 2020-03-23: 1 via ORAL
  Filled 2020-03-23: qty 1

## 2020-03-23 MED ORDER — VANCOMYCIN HCL IN DEXTROSE 750-5 MG/150ML-% IV SOLN
750.0000 mg | INTRAVENOUS | Status: DC
  Administered 2020-03-26: 750 mg via INTRAVENOUS
  Filled 2020-03-23 (×2): qty 150

## 2020-03-23 MED ORDER — SODIUM CHLORIDE 0.9% FLUSH
10.0000 mL | Freq: Three times a day (TID) | INTRAVENOUS | Status: DC
  Administered 2020-03-24 – 2020-03-27 (×10): 10 mL

## 2020-03-23 MED ORDER — VANCOMYCIN HCL 1750 MG/350ML IV SOLN
1750.0000 mg | Freq: Once | INTRAVENOUS | Status: AC
  Administered 2020-03-23: 1750 mg via INTRAVENOUS
  Filled 2020-03-23: qty 350

## 2020-03-23 NOTE — Progress Notes (Signed)
Unclamped Chest tube as per MD orders.

## 2020-03-23 NOTE — Progress Notes (Signed)
[facility] Progress Note  Subjective: extra HD cancelled last night. Pt had empyema and L sided pigtail CT placed w/ purulent material returned.   Vitals:   03/22/20 1639 03/22/20 2121 03/23/20 0500 03/23/20 0915  BP: 138/66 119/63 117/69 129/63  Pulse: 80  73   Resp: (!) '25 18 18   '$ Temp:  (!) 100.4 F (38 C) 97.6 F (36.4 C) 97.8 F (36.6 C)  TempSrc:  Oral Oral Oral  SpO2: 98% 92% 94% 94%  Weight:      Height:        Exam: Gen alert, no distress  Chest dec'd at bases, o/w clear bilat RRR no MRG Abd soft ntnd no mass or ascites +bs Ext no LE or UE edema Neuro is alert, Ox 3 , nf   LUE AVF+bruit    Home meds:  - asa 81/ crestor 5 / lisinopril 20 qd/ coreg 12.5 bid/ sl ntg prn  - phoslo 1 ac tid/ amaryl '2mg'$  qam  - prn's/ vitamins/ supplements     OP HD: AF MWF  4h  450/500  75.5kg  2/2.25 bath  LUE AVF  Hep 7000  -mircera 30 q4wk, last 2/7  - hect 3ug tiw   Assessment/ Plan: 1. Pleural effusion/ empyema/ SOB - sp L chest tube 2/26. Doubt sig vol overload. Is close to dry wt after HD on Friday here. No fever, not getting abx at this time. Awaiting cytology and cx's. GS +wbc no org.  2. HTN/ vol - BP meds on hold, BP's okay. Euvolemic on exam today.  3. H/o CM - last echo in 2017 showed recovery to 60% EF 4. ESRD - usual HD MWF. Had HD here on Friday. Next HD Monday.  5. 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 Anemia ckd - last esa 2/7. Next dose due on 3/07, 40ug darbe IV ordered. Watch Hb, tx prn        Rob Schertz 03/23/2020, 2:09 PM   Recent Labs  Lab 03/22/20 0222 03/22/20 1757 03/23/20 0755  K 3.9  --  4.2  BUN 23  --  36*  CREATININE 5.03*  --  6.65*  CALCIUM 8.8*  --  9.2  HGB 8.8* 9.9* 9.6*   Inpatient medications: . aspirin EC  81 mg Oral Daily  . calcium acetate  667 mg Oral TID WC  . Chlorhexidine Gluconate Cloth  6 each Topical Q0600  . doxercalciferol  3 mcg Intravenous Q M,W,F-HD  .  heparin  5,000 Units Subcutaneous Q8H  . insulin aspart  0-15 Units Subcutaneous TID WC  . insulin glargine  5 Units Subcutaneous QHS  . rosuvastatin  5 mg Oral Daily  . sodium chloride flush  10 mL Intracatheter Q8H    acetaminophen **OR** acetaminophen, oxyCODONE-acetaminophen

## 2020-03-23 NOTE — Discharge Summary (Incomplete)
   Name: AZEL BENEDETTI MRN: [id number] DOB: 07/28/1955 65 y.o. PCP: Patient, No Pcp Per  Date of Admission: 03/21/2020  7:30 AM Date of Discharge:  Attending Physician: Velna Ochs, MD  Discharge Diagnosis: 1. Acute hypoxic respiratory failure 2. Complex loculated empyema  Discharge Medications: Allergies as of 03/23/2020   No Known Allergies   Med Rec must be completed prior to using this [facility]***       Disposition and follow-up:   Mr.Bailey E Seman was discharged from [facility] in {DISCHARGE CONDITION:19696} condition.  At the hospital follow up visit please address:  1.  ***  2.  Labs / imaging needed at time of follow-up: ***  3.  Pending labs/ test needing follow-up: ***  Follow-up Appointments:   Hospital Course by problem list:  20 yom with ESRD on HD MWF, ICM with combined heart failure, CAD, and type 2 diabetes mellitus who presented to [facility] ED for dizziness during dialysis.  Acute hypoxic respiratory failure secondary complex loculated left empyema CXR on admission revealed a large left pleural effusion. IR was initially consulted for drainage however they found loculations and thick fluid on ultrasound prior to the procedure.  Chest CT was obtained on hospital day #1 which the left pleural effusion with adjacent pleural thickening as well as enhancing consolidative opacities of the lungs bilaterally. Pulmonology was consulted and he underwent a chest tube placement with drainage of purulent appearing fluid. Fluid analysis revealed a WBC count of ~13k and gram stain was negative. LDH from the pleural fluid was significantly elevated to ~7k.  Due to the loculations, he underwent fibrinolytic therapy by pulmonology.  Discharge Exam:   BP 129/63   Pulse 73   Temp 97.8 F (36.6 C) (Oral)   Resp 18   Ht 6' (1.829 m)   Wt 77.8 kg   SpO2 94%   BMI 23.26 kg/m  Discharge exam: ***  Pertinent Labs, Studies, and Procedures:   ***  Discharge Instructions:   Signed: Mitzi Hansen, MD 03/23/2020, 1:11 PM   Pager: '@[email or web address]'$ @

## 2020-03-23 NOTE — Progress Notes (Signed)
   Subjective/Interm history: Chest tube placed by pulm yesterday afternoon.  Objective:  Vital signs in last 24 hours: Vitals:   03/22/20 1545 03/22/20 1639 03/22/20 2121 03/23/20 0500  BP: (!) 143/65 138/66 119/63 117/69  Pulse: 83 80  73  Resp: (!) 25 (!) '25 18 18  '$ Temp:   (!) 100.4 F (38 C) 97.6 F (36.4 C)  TempSrc:   Oral Oral  SpO2: 100% 98% 92% 94%  Weight:      Height:       General: well appearing Cardiac: RRR, no LE edema Pulm: breathing comfortably on RA. Left chest tube set to wall suction--pleural fluid appearing drainage. Lung sounds diminished on the left. Assessment/Plan:  Active Problems:   Pleural effusion   Acute hypoxemic respiratory failure (HCC)   Biventricular heart failure, NYHA class 3 (HCC)   Pleural effusion on left   Loculated empyema (left) s/p pigtail pleural cath placed 2/26 Infection vs malignancy remain on the differential. Low grade temp overnight to 100.4 but otherwise has no systemic infectious signs. Purulent fluid noted on chest tube placement yesterday. Pleural fluid gram stain showed a lot of white blood cells but no organisms.  The severely elevated LDH to ~7000 is concerning for malignancy however CT from yesterday did not show significant changes in findings from prior CT in 2021.  No chest tube output recorded. ~1L present in chest tube canister Plan -appreciate pulm consult>>planning to continue pleural fibrinolytic therapy  -awaiting cytology and culture results -nursing to document drain output  Chronic combined heart failure NYHA class 3, CAD, Hyperlipidemia -continue aspirin, statin -tele monitoring  ESRD on HD MWF Secondary Hyperparathyroidism -management per nephrology  Anemia of chronic disease consistent with yesterday's iron panel. Thrombocytosis (mild).  -continue to monitor   Uncontrolled Type 2 Diabetes Mellitus. A1C 9.7 on admission. 1 hypoglycemic event noted yesterday afternoon. Otherwise CBGs are  improved. -start lantus 5U qHS -moderate SSI -adjust as needed   Prior to Admission Living Arrangement: home Anticipated Discharge Location: home Barriers to Discharge: ongoing evaluation and treatment Dispo: 3-5 days  Mitzi Hansen, MD Internal Medicine Resident PGY-2 [facility] Pager: [phone] 03/23/2020 7:11 AM    After 5pm on weekdays and 1pm on weekends: On Call pager [phone]

## 2020-03-23 NOTE — Plan of Care (Signed)

## 2020-03-23 NOTE — Progress Notes (Signed)
Pharmacy Antibiotic Note  Kyle [name] is a 65 y.o. male admitted on 03/21/2020 with empyema.  Pharmacy has been consulted for Vancomycin dosing. Pt with ESRD on HD (MWF).  Plan: Vancomycin '1750mg'$  IV now then '750mg'$  IV QHD Will f/u HD schedule/tolerance, micro data, and pt's clinical condition Vanc levels prn   Height: 6' (182.9 cm) Weight: 77.8 kg (171 lb 8.3 oz) IBW/kg (Calculated) : 77.6  Temp (24hrs), Avg:98.4 F (36.9 C), Min:97.6 F (36.4 C), Max:100.4 F (38 C)  Recent Labs  Lab 03/21/20 0841 03/22/20 0222 03/22/20 1757 03/23/20 0755  WBC 10.3 8.9 9.7 8.5  CREATININE 6.62* 5.03*  --  6.65*    Estimated Creatinine Clearance: 12.3 mL/min (A) (by C-G formula based on SCr of 6.65 mg/dL (H)).    No Known Allergies  Antimicrobials this admission: 2/27 Vanc >>   Microbiology results: 2/27 Pleural fluid: Rare SA - reincubated  Thank you for allowing pharmacy to be a part of this patient's care.  Sherlon Handing, PharmD, BCPS Please see amion for complete clinical pharmacist phone list 03/23/2020 9:05 PM

## 2020-03-23 NOTE — Progress Notes (Signed)
NAME:  Kyle [name], MRN:  [id number], DOB:  1955/05/04, LOS: 2 ADMISSION DATE:  03/21/2020, CONSULTATION DATE:  2/26 REFERRING MD:  Macon Large, CHIEF COMPLAINT:  Chest tightness, dyspnea   Brief History:  65 y/o male admitted with chest tightness, dyspnea found to have a moderate to large left pleural effusion of uncertain etiology. At baseline has ESRD.  Past Medical History:  ESRD on HD DM2 HTN HLD BiV HF CAD Pulmonary hypertension   Significant Hospital Events:  2/25 admitted to IMTS. L Pleural effusion not amenable to thora with IR due to loculated appearance 2/26 CT chest confirms complex, loculated effusion. PCCM consult for possible chest tube, management of effusion   Consults:  IR PCCM  Procedures:  2/26 pigtail (planned)  Significant Diagnostic Tests:  2/26 CT chest w con> Moderate L pleural effusion with pleural thickening, similar in appearance to Ct in 2021. Trace R effusion. Bilateral consolidative opacities (suspected rounded atelectasis). Cardiomegaly, small pericardial effusion. 27m focal pulm nodule. Three vessel CAD.   Micro Data:  2/25 SARS Cov2> neg 2/27 left pleural effusion >   Antimicrobials:     Interim History / Subjective:  Feels better Less chest tightness 1 liter chest tube output  Objective   Blood pressure 129/63, pulse 73, temperature 97.8 F (36.6 C), temperature source Oral, resp. rate 18, height 6' (1.829 m), weight 77.8 kg, SpO2 94 %.       No intake or output data in the 24 hours ending 03/23/20 1521 Filed Weights   03/21/20 1321 03/21/20 1412  Weight: 79.4 kg 77.8 kg    Examination: General:  Resting comfortably in bed HENT: NCAT OP clear PULM: Diminished left base B, normal effort CV: RRR, no mgr GI: BS+, soft, nontender MSK: normal bulk and tone Neuro: awake, alert, no distress, MAEW  2/27 CXR images personally reviewed> pleural effusion decreased in size but now there is pneumothorax ex vacuo  Resolved  Hospital Problem list     Assessment & Plan:  Loculated left pleural effusion > neutrophil predominant, exudative process; still not clear to me what caused this though I suppose infection is possible.  Need to follow up micro and cytology report.  Agree with holding antibiotics for now. Today I don't think there is much value in adding tpa/pulmozyme again.  Bedside ultrasound today showed only a small area of fluid in the left base.  Hopefully the lung will re-expand in the next 24 hours. > repeat CXR in AM > chest tube to suction, add orderset for flushes to tube > f/u micro/cytology    Labs   CBC: Recent Labs  Lab 03/21/20 0841 03/22/20 0222 03/22/20 1757 03/23/20 0755  WBC 10.3 8.9 9.7 8.5  NEUTROABS 9.0* 7.7 8.4*  --   HGB 9.1* 8.8* 9.9* 9.6*  HCT 26.7* 25.3* 28.5* 28.1*  MCV 95.4 93.7 94.4 93.7  PLT 433* 412* 548* 406*    Basic Metabolic Panel: Recent Labs  Lab 03/21/20 0841 03/22/20 0222 03/23/20 0755  NA 133* 131* 133*  K 3.9 3.9 4.2  CL 90* 94* 93*  CO2 '29 28 27  '$ GLUCOSE 244* 241* 102*  BUN 35* 23 36*  CREATININE 6.62* 5.03* 6.65*  CALCIUM 9.2 8.8* 9.2  MG  --  2.0  --    GFR: Estimated Creatinine Clearance: 12.3 mL/min (A) (by C-G formula based on SCr of 6.65 mg/dL (H)). Recent Labs  Lab 03/21/20 0841 03/22/20 0222 03/22/20 1757 03/23/20 0755  WBC 10.3 8.9 9.7 8.5  Liver Function Tests: Recent Labs  Lab 03/21/20 1213 03/22/20 0222  AST 27 22  ALT 25 23  ALKPHOS 137* 131*  BILITOT 0.9 0.9  PROT 6.8 6.7  ALBUMIN 2.1* 2.0*   No results for input(s): LIPASE, AMYLASE in the last 168 hours. No results for input(s): AMMONIA in the last 168 hours.  ABG    Component Value Date/Time   PHART 7.428 09/04/2014 1040   PCO2ART 41.0 09/04/2014 1040   PO2ART 72.0 (L) 09/04/2014 1040   HCO3 26.9 (H) 09/04/2014 1043   TCO2 31 12/04/2015 0646   ACIDBASEDEF 1.0 08/25/2014 1045   O2SAT 69.0 09/04/2014 1043     Coagulation Profile: No results for  input(s): INR, PROTIME in the last 168 hours.  Cardiac Enzymes: No results for input(s): CKTOTAL, CKMB, CKMBINDEX, TROPONINI in the last 168 hours.  HbA1C: Hgb A1c MFr Bld  Date/Time Value Ref Range Status  03/21/2020 11:44 AM 9.7 (H) 4.8 - 5.6 % Final    Comment:    (NOTE) Pre diabetes:          5.7%-6.4%  Diabetes:              >6.4%  Glycemic control for   <7.0% adults with diabetes   08/20/2014 08:58 PM 7.6 (H) 4.8 - 5.6 % Final    Comment:    (NOTE)         Pre-diabetes: 5.7 - 6.4         Diabetes: >6.4         Glycemic control for adults with diabetes: <7.0     CBG: Recent Labs  Lab 03/22/20 1220 03/22/20 1744 03/22/20 1833 03/22/20 2049 03/23/20 1208  GLUCAP 155* 63* 75 162* 80     Critical care time: n/a     [name], MD [place] [facility] Pager: [phone] Cell: [phone] If no response, please call [phone] until 7pm After 7:00 pm call Elink  [phone]

## 2020-03-24 ENCOUNTER — Encounter (HOSPITAL_COMMUNITY): Payer: Self-pay | Admitting: Pulmonary Disease

## 2020-03-24 ENCOUNTER — Inpatient Hospital Stay (HOSPITAL_COMMUNITY): Payer: Medicare (Managed Care)

## 2020-03-24 DIAGNOSIS — N186 End stage renal disease: Secondary | ICD-10-CM | POA: Diagnosis not present

## 2020-03-24 DIAGNOSIS — I5082 Biventricular heart failure: Secondary | ICD-10-CM | POA: Diagnosis not present

## 2020-03-24 DIAGNOSIS — J90 Pleural effusion, not elsewhere classified: Secondary | ICD-10-CM | POA: Diagnosis not present

## 2020-03-24 LAB — CBC
HCT: 31.5 % — ABNORMAL LOW (ref 39.0–52.0)
Hemoglobin: 10.7 g/dL — ABNORMAL LOW (ref 13.0–17.0)
MCH: 31.9 pg (ref 26.0–34.0)
MCHC: 34 g/dL (ref 30.0–36.0)
MCV: 94 fL (ref 80.0–100.0)
Platelets: 474 10*3/uL — ABNORMAL HIGH (ref 150–400)
RBC: 3.35 MIL/uL — ABNORMAL LOW (ref 4.22–5.81)
RDW: 15.4 % (ref 11.5–15.5)
WBC: 10.6 10*3/uL — ABNORMAL HIGH (ref 4.0–10.5)
nRBC: 0 % (ref 0.0–0.2)

## 2020-03-24 LAB — GLUCOSE, CAPILLARY
Glucose-Capillary: 120 mg/dL — ABNORMAL HIGH (ref 70–99)
Glucose-Capillary: 67 mg/dL — ABNORMAL LOW (ref 70–99)
Glucose-Capillary: 109 mg/dL — ABNORMAL HIGH (ref 70–99)
Glucose-Capillary: 122 mg/dL — ABNORMAL HIGH (ref 70–99)
Glucose-Capillary: 157 mg/dL — ABNORMAL HIGH (ref 70–99)

## 2020-03-24 LAB — RENAL FUNCTION PANEL
Albumin: 2 g/dL — ABNORMAL LOW (ref 3.5–5.0)
Anion gap: 13 (ref 5–15)
BUN: 44 mg/dL — ABNORMAL HIGH (ref 8–23)
CO2: 23 mmol/L (ref 22–32)
Calcium: 9.3 mg/dL (ref 8.9–10.3)
Chloride: 95 mmol/L — ABNORMAL LOW (ref 98–111)
Creatinine, Ser: 7.52 mg/dL — ABNORMAL HIGH (ref 0.61–1.24)
GFR, Estimated: 7 mL/min — ABNORMAL LOW (ref >60)
Glucose, Bld: 69 mg/dL — ABNORMAL LOW (ref 70–99)
Phosphorus: 3.8 mg/dL (ref 2.5–4.6)
Potassium: 4.8 mmol/L (ref 3.5–5.1)
Sodium: 131 mmol/L — ABNORMAL LOW (ref 135–145)

## 2020-03-24 LAB — CYTOLOGY - NON PAP

## 2020-03-24 MED ORDER — DOXERCALCIFEROL 4 MCG/2ML IV SOLN
INTRAVENOUS | Status: AC
  Administered 2020-03-24: 3 ug via INTRAVENOUS
  Filled 2020-03-24: qty 2

## 2020-03-24 MED ORDER — OXYCODONE-ACETAMINOPHEN 5-325 MG PO TABS
1.0000 | ORAL_TABLET | Freq: Four times a day (QID) | ORAL | Status: DC | PRN
  Administered 2020-03-24 – 2020-03-26 (×2): 1 via ORAL
  Filled 2020-03-24 (×2): qty 1

## 2020-03-24 MED ORDER — INSULIN GLARGINE 100 UNIT/ML ~~LOC~~ SOLN
3.0000 [IU] | Freq: Every day | SUBCUTANEOUS | Status: DC
  Filled 2020-03-24: qty 0.03

## 2020-03-24 MED ORDER — VANCOMYCIN HCL IN DEXTROSE 750-5 MG/150ML-% IV SOLN
INTRAVENOUS | Status: AC
  Administered 2020-03-24: 750 mg via INTRAVENOUS
  Filled 2020-03-24: qty 150

## 2020-03-24 NOTE — Procedures (Signed)
Patient was seen on dialysis and the procedure was supervised.  BFR 350  Via AVG BP is  105/61.   Patient appears to be tolerating treatment well  Louis Meckel 03/24/2020

## 2020-03-24 NOTE — Progress Notes (Signed)
Inpatient Diabetes Program Recommendations  AACE/ADA: New Consensus Statement on Inpatient Glycemic Control (2015)  Target Ranges:  Prepandial:   less than 140 mg/dL      Peak postprandial:   less than 180 mg/dL (1-2 hours)      Critically ill patients:  140 - 180 mg/dL   Lab Results  Component Value Date   GLUCAP 109 (H) 03/24/2020   HGBA1C 9.7 (H) 03/21/2020    Review of Glycemic Control  Diabetes history: DM2 Outpatient Diabetes medications: Amaryl 2 mg QD Current orders for Inpatient glycemic control: Lantus 5 units QHS, Novolog 0-15 units TID with meals  Mild hypoglycemia this am.  Inpatient Diabetes Program Recommendations:     Decrease Lantus to 4 units QHS Decrease Novolog to 0-9 units TID with meals. May need meal coverage if post-prandials > 180 mg/dL.  Will continue to follow.  Thank you. Lorenda Peck, RD, LDN, CDE Inpatient Diabetes Coordinator [phone]

## 2020-03-24 NOTE — Progress Notes (Addendum)
   Subjective:  Patient evaluated at bedside this AM. Reports he's doing well, no pain at the site of the chest tube. Discussed plans to continue chest tube and antibiotics for possible infection. Patient verbalized understanding.  Objective:  Vital signs in last 24 hours: Vitals:   03/23/20 0500 03/23/20 0915 03/23/20 1912 03/24/20 0523  BP: 117/69 129/63 131/70 128/70  Pulse: 73 76 80 86  Resp: '18  20 20  '$ Temp: 97.6 F (36.4 C) 97.8 F (36.6 C) 97.6 F (36.4 C) 98.1 F (36.7 C)  TempSrc: Oral Oral Oral Oral  SpO2: 94% 94% 95% 91%  Weight:      Height:       Physical Exam: General: Laying in bed comfortably in HD, no acute distress CV: Regular rate, rhythm. Systolic murmur appreciated. Pulm: Decreased aeration on left. No rales, wheezing appreciated bilaterally. Normal WOB. MSK: No pitting edema bilaterally.  Assessment/Plan: Kyle [name] is 65yo person with ESRD on MWF HD, chronic systolic and diastolic heart failure, type II diabetes mellitus, secondary hyperparathyroidism, iron deficiency anemia, hypertension admitted 2/25 with loculated left pleural effusion, concerning for possible infection vs malignancy, pending continued work-up.  Principal Problem:   Pleural effusion on left Active Problems:   Pleural effusion   DM (diabetes mellitus), type 2, uncontrolled, with renal complications (HCC)   Acute hypoxemic respiratory failure (HCC)   ESRD (end stage renal disease) on dialysis (HCC)   Biventricular heart failure, NYHA class 3 ([place])  #Loculated left pleural effusion s/p chest tube placement 2/26 Overnight no acute events, remains afebrile and hemodynamically stable. Differential continues to include infection vs malignancy. Continues to have high-output from chest tube. CXR this AM appears improved from previous study but continues to have pleural effusion. Cultures growing rare staph aureus, vancomycin started yesterday. Pleural LDH ~7000. Will defer to  pulmonology for need for pleural fibrinolytic therapy. Will continue to await further studies. Appreciate pulmonology recs. - C/w vancomycin - Pending cytology, further culture results  #ESRD on MWF Appears euvolemic on exam today. Electrolytes within normal limits. Appreciate nephrology assistance.  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 Patient reportedly had NM stress test afterwards but unable to locate in chart. Likely will need further ischemic eval/catheterization in outpatient setting, especially since he has plans to undergo kidney transplantation in the future. - F/u outpatient cardiology for ischemic evaluation  #Type II diabetes mellitus A1c 9.7 on admission. Patient did have hypoglycemic event overnight, but did not eat meal last night and did not receive nightly Lantus. Will continue with current regimen. Could - Holding home glimepiride - would recommend d/c on discharge given his ESRD (contraindicated). Could consider changing to low dose glipizide. But would recommend GLP-1 agonist +/- low dose insulin. Will need optimization prior to transplant surgery.  - C/w Lantus 5u QHS - SSI  #Chronic normocytic anemia Based on iron studies this admission, most likely anemia due to chronic disease. No obvious signs of bleeding. Will continue to monitor. - Daily CBC  DIET: HH IVF: n/a DVT PPX: Heparin BOWEL: n/a CODE: FULL FAM COM: n/a  Prior to Admission Living Arrangement: Home Anticipated Discharge Location: pending PT/OT eval Barriers to Discharge: medical management Dispo: Anticipated discharge in approximately 2-3 day(s).   Kyle Dame, MD 03/24/2020, 6:43 AM Pager: [phone] After 5pm on weekdays and 1pm on weekends: On Call pager [phone]

## 2020-03-24 NOTE — Progress Notes (Signed)
[facility] Progress Note  Subjective: CT in place , has had over a liter of output-  Thinking is infectious ? Full culture not back - seen on HD today   Vitals:   03/23/20 0500 03/23/20 0915 03/23/20 1912 03/24/20 0523  BP: 117/69 129/63 131/70 128/70  Pulse: 73 76 80 86  Resp: '18  20 20  '$ Temp: 97.6 F (36.4 C) 97.8 F (36.6 C) 97.6 F (36.4 C) 98.1 F (36.7 C)  TempSrc: Oral Oral Oral Oral  SpO2: 94% 94% 95% 91%  Weight:      Height:        Exam: Gen alert, no distress  Chest dec'd at bases, o/w clear bilat RRR no MRG Abd soft ntnd no mass or ascites +bs Ext no LE or UE edema Neuro is alert, Ox 3 , nf   LUE AVF+bruit    Home meds:  - asa 81/ crestor 5 / lisinopril 20 qd/ coreg 12.5 bid/ sl ntg prn  - phoslo 1 ac tid/ amaryl '2mg'$  qam  - prn's/ vitamins/ supplements     OP HD: AF MWF  4h  450/500  75.5kg  2/2.25 bath  LUE AVF  Hep 7000  -mircera 30 q4wk, last 2/7  - hect 3ug tiw   Assessment/ Plan: 1. Pleural effusion/ empyema/ SOB - sp L chest tube 2/26. Doubt sig vol overload. Is close to dry wt after HD on Friday here. No fever, not getting abx at this time. Awaiting cytology and cx's. GS +wbc no org. Pulm following  2. HTN/ vol - BP meds on hold, BP's okay. Euvolemic it seems on exam today.  3. H/o CM - last echo in 2017 showed recovery to 60% EF but has decreased of late, seeing cards as OP 4. ESRD - usual HD MWF. [facility] Kitchen Next HD Wednesday after today  5. 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 Anemia ckd - last esa 2/7. Next dose due on 3/07, 40ug darbe IV ordered. Watch Hb, tx prn     Louis Meckel  03/24/2020, 8:45 AM   Recent Labs  Lab 03/23/20 0755 03/24/20 0348  K 4.2 4.8  BUN 36* 44*  CREATININE 6.65* 7.52*  CALCIUM 9.2 9.3  PHOS  --  3.8  HGB 9.6* 10.7*   Inpatient medications: . aspirin EC  81 mg Oral Daily  . calcium acetate  667 mg Oral TID WC  . Chlorhexidine Gluconate Cloth  6  each Topical Q0600  . [START ON 03/31/2020] darbepoetin (ARANESP) injection - DIALYSIS  40 mcg Intravenous Q Mon-HD  . doxercalciferol  3 mcg Intravenous Q M,W,F-HD  . heparin  5,000 Units Subcutaneous Q8H  . insulin aspart  0-15 Units Subcutaneous TID WC  . insulin glargine  5 Units Subcutaneous QHS  . rosuvastatin  5 mg Oral Daily  . sodium chloride flush  10 mL Intracatheter Q8H  . sodium chloride flush  10 mL Intracatheter Q8H   . vancomycin     acetaminophen **OR** acetaminophen

## 2020-03-24 NOTE — Progress Notes (Signed)
NAME:  Kyle [name], MRN:  [id number], DOB:  [birth year], LOS: 3 ADMISSION DATE:  03/21/2020, CONSULTATION DATE:  2/26 REFERRING MD:  Macon Large, CHIEF COMPLAINT:  Chest tightness, dyspnea   Brief History:  65 y/o male admitted with chest tightness, dyspnea found to have a moderate to large left pleural effusion of uncertain etiology. At baseline has ESRD.  Past Medical History:  ESRD on HD DM2 HTN HLD BiV HF CAD Pulmonary hypertension   Significant Hospital Events:  2/25 admitted to IMTS. L Pleural effusion not amenable to thora with IR due to loculated appearance 2/26 CT chest confirms complex, loculated effusion. PCCM consult for possible chest tube, management of effusion   Consults:  IR PCCM  Procedures:  2/26 pigtail (planned)  Significant Diagnostic Tests:  2/26 CT chest w con> Moderate L pleural effusion with pleural thickening, similar in appearance to Ct in 2021. Trace R effusion. Bilateral consolidative opacities (suspected rounded atelectasis). Cardiomegaly, small pericardial effusion. 10m focal pulm nodule. Three vessel CAD.   Micro Data:  2/25 SARS Cov2> neg 2/27 left pleural effusion >   Antimicrobials:     Interim History / Subjective:   Feels tired Breathing about the same   Objective   Blood pressure 124/68, pulse 86, temperature 97.7 F (36.5 C), temperature source Oral, resp. rate 19, height 6' (1.829 m), weight 75.6 kg, SpO2 94 %.        Intake/Output Summary (Last 24 hours) at 03/24/2020 1512 Last data filed at 03/24/2020 1125 Gross per 24 hour  Intake 600.71 ml  Output 3140 ml  Net -2539.29 ml   Filed Weights   03/21/20 1321 03/21/20 1412 03/24/20 0806  Weight: 79.4 kg 77.8 kg 75.6 kg    Examination: General:  Resting comfortably in bed HENT: NCAT OP clear PULM: Diminished R base, normal effort CV: RRR, no mgr GI: BS+, soft, nontender MSK: normal bulk and tone Neuro: awake, alert, no distress, MAEW  2/28 CXR images  personally reviewed> pleural effusion personally reviewed, pneumothorax ex vacuo still present  Resolved Hospital Problem list     Assessment & Plan:  Empyema left lung, unclear etiology 2/28 bedside ultrasound> no fluid left lung Lung entrapment, don't think it will re-expand > for now leave chest tube in place and follow drainage > consider repeat CT if not re-expanded in AM   Labs   CBC: Recent Labs  Lab 03/21/20 0841 03/22/20 0222 03/22/20 1757 03/23/20 0755 03/24/20 0348  WBC 10.3 8.9 9.7 8.5 10.6*  NEUTROABS 9.0* 7.7 8.4*  --   --   HGB 9.1* 8.8* 9.9* 9.6* 10.7*  HCT 26.7* 25.3* 28.5* 28.1* 31.5*  MCV 95.4 93.7 94.4 93.7 94.0  PLT 433* 412* 548* 406* 474*    Basic Metabolic Panel: Recent Labs  Lab 03/21/20 0841 03/22/20 0222 03/23/20 0755 03/24/20 0348  NA 133* 131* 133* 131*  K 3.9 3.9 4.2 4.8  CL 90* 94* 93* 95*  CO2 '29 28 27 23  '$ GLUCOSE 244* 241* 102* 69*  BUN 35* 23 36* 44*  CREATININE 6.62* 5.03* 6.65* 7.52*  CALCIUM 9.2 8.8* 9.2 9.3  MG  --  2.0  --   --   PHOS  --   --   --  3.8   GFR: Estimated Creatinine Clearance: 10.6 mL/min (A) (by C-G formula based on SCr of 7.52 mg/dL (H)). Recent Labs  Lab 03/22/20 0222 03/22/20 1757 03/23/20 0755 03/24/20 0348  WBC 8.9 9.7 8.5 10.6*    Liver Function  Tests: Recent Labs  Lab 03/21/20 1213 03/22/20 0222 03/24/20 0348  AST 27 22  --   ALT 25 23  --   ALKPHOS 137* 131*  --   BILITOT 0.9 0.9  --   PROT 6.8 6.7  --   ALBUMIN 2.1* 2.0* 2.0*   No results for input(s): LIPASE, AMYLASE in the last 168 hours. No results for input(s): AMMONIA in the last 168 hours.  ABG    Component Value Date/Time   PHART 7.428 09/04/2014 1040   PCO2ART 41.0 09/04/2014 1040   PO2ART 72.0 (L) 09/04/2014 1040   HCO3 26.9 (H) 09/04/2014 1043   TCO2 31 12/04/2015 0646   ACIDBASEDEF 1.0 08/25/2014 1045   O2SAT 69.0 09/04/2014 1043     Coagulation Profile: No results for input(s): INR, PROTIME in the last  168 hours.  Cardiac Enzymes: No results for input(s): CKTOTAL, CKMB, CKMBINDEX, TROPONINI in the last 168 hours.  HbA1C: Hgb A1c MFr Bld  Date/Time Value Ref Range Status  03/21/2020 11:44 AM 9.7 (H) 4.8 - 5.6 % Final    Comment:    (NOTE) Pre diabetes:          5.7%-6.4%  Diabetes:              >6.4%  Glycemic control for   <7.0% adults with diabetes   08/20/2014 08:58 PM 7.6 (H) 4.8 - 5.6 % Final    Comment:    (NOTE)         Pre-diabetes: 5.7 - 6.4         Diabetes: >6.4         Glycemic control for adults with diabetes: <7.0     CBG: Recent Labs  Lab 03/23/20 1638 03/23/20 1915 03/24/20 0643 03/24/20 0723 03/24/20 1215  GLUCAP 94 81 67* 120* 109*     Critical care time: n/a     [name], MD [place] [facility] Pager: [phone] Cell: [phone] If no response, please call [phone] until 7pm After 7:00 pm call Elink  [phone]

## 2020-03-25 ENCOUNTER — Inpatient Hospital Stay (HOSPITAL_COMMUNITY): Payer: Medicare (Managed Care)

## 2020-03-25 DIAGNOSIS — J90 Pleural effusion, not elsewhere classified: Secondary | ICD-10-CM | POA: Diagnosis not present

## 2020-03-25 DIAGNOSIS — I5082 Biventricular heart failure: Secondary | ICD-10-CM | POA: Diagnosis not present

## 2020-03-25 DIAGNOSIS — D649 Anemia, unspecified: Secondary | ICD-10-CM

## 2020-03-25 DIAGNOSIS — E119 Type 2 diabetes mellitus without complications: Secondary | ICD-10-CM

## 2020-03-25 LAB — GLUCOSE, CAPILLARY
Glucose-Capillary: 229 mg/dL — ABNORMAL HIGH (ref 70–99)
Glucose-Capillary: 188 mg/dL — ABNORMAL HIGH (ref 70–99)
Glucose-Capillary: 69 mg/dL — ABNORMAL LOW (ref 70–99)
Glucose-Capillary: 88 mg/dL (ref 70–99)
Glucose-Capillary: 162 mg/dL — ABNORMAL HIGH (ref 70–99)

## 2020-03-25 LAB — CBC
HCT: 31.5 % — ABNORMAL LOW (ref 39.0–52.0)
Hemoglobin: 10.5 g/dL — ABNORMAL LOW (ref 13.0–17.0)
MCH: 31.5 pg (ref 26.0–34.0)
MCHC: 33.3 g/dL (ref 30.0–36.0)
MCV: 94.6 fL (ref 80.0–100.0)
Platelets: 455 10*3/uL — ABNORMAL HIGH (ref 150–400)
RBC: 3.33 MIL/uL — ABNORMAL LOW (ref 4.22–5.81)
RDW: 15.2 % (ref 11.5–15.5)
WBC: 10.2 10*3/uL (ref 4.0–10.5)
nRBC: 0 % (ref 0.0–0.2)

## 2020-03-25 LAB — RENAL FUNCTION PANEL
Albumin: 1.9 g/dL — ABNORMAL LOW (ref 3.5–5.0)
Anion gap: 13 (ref 5–15)
BUN: 27 mg/dL — ABNORMAL HIGH (ref 8–23)
CO2: 26 mmol/L (ref 22–32)
Calcium: 9.1 mg/dL (ref 8.9–10.3)
Chloride: 95 mmol/L — ABNORMAL LOW (ref 98–111)
Creatinine, Ser: 5.88 mg/dL — ABNORMAL HIGH (ref 0.61–1.24)
GFR, Estimated: 10 mL/min — ABNORMAL LOW (ref >60)
Glucose, Bld: 174 mg/dL — ABNORMAL HIGH (ref 70–99)
Phosphorus: 3.6 mg/dL (ref 2.5–4.6)
Potassium: 3.9 mmol/L (ref 3.5–5.1)
Sodium: 134 mmol/L — ABNORMAL LOW (ref 135–145)

## 2020-03-25 MED ORDER — INSULIN GLARGINE 100 UNIT/ML ~~LOC~~ SOLN
2.0000 [IU] | Freq: Every day | SUBCUTANEOUS | Status: DC
  Administered 2020-03-26 – 2020-03-27 (×2): 2 [IU] via SUBCUTANEOUS
  Filled 2020-03-25 (×3): qty 0.02

## 2020-03-25 NOTE — Evaluation (Signed)
Occupational Therapy Evaluation Patient Details Name: Kyle [name] MRN: [id number] DOB: [birth year] Today's Date: 03/25/2020    History of Present Illness 65 yo admitted 2/25 with chest tightness found to have left loculated pleural effusion not conducive to thoracentesis with pigtail catheter 2/26. PMhx: ESRD, HTN, HLD, DM, biventricular HF EF 10%, CAD, pulmonary HTN   Clinical Impression   PTA, pt lives with spouse and reports Independent with all ADLs, IADLs and mobility without AD. Pt works as a Theme park manager. Pt presents now with minor deficits in dynamic standing balance, endurance and cardiopulmonary tolerance. Pt overall min guard for short mobility in room without AD, unsteadiness noted and pt aware of this as well. Pt overall Independent for UB ADLs and Supervision for LB ADLs due to deficits. Encouraged consideration of DME use for mobility until stability regained. Pt reports he may have access to cane, walker, shower seat, etc (to be determined DME needs). Anticipate pt to progress well and not require OT services at discharge. However, pt would benefit from acute level to maximize safety and independence with ADLs/mobility.   Pt received on 1 L O2, SpO2 93%. Trialed on RA with 1/4 DOE noted after activity. Personal pulse ox reading 80% on RA, no change with 1 L O2 application. Cross-checked with unit dynamap with SpO2 99% on 1 L O2 at rest.     Follow Up Recommendations  No OT follow up;Supervision - Intermittent    Equipment Recommendations  Other (comment) (to be determined. Pt reports he may have access to DME)    Recommendations for Other Services       Precautions / Restrictions Precautions Precautions: Fall;Other (comment) Precaution Comments: monitor O2, chest tube Restrictions Weight Bearing Restrictions: No      Mobility Bed Mobility               General bed mobility comments: Received in recliner    Transfers Overall transfer level: Needs  assistance Equipment used: None Transfers: Sit to/from Stand;Stand Pivot Transfers Sit to Stand: Min guard Stand pivot transfers: Min guard       General transfer comment: min guard to ensure safety due to instability in standing/dynamic tasks, no overt LOB with short mobility    Balance Overall balance assessment: Needs assistance Sitting-balance support: No upper extremity supported;Feet supported Sitting balance-Leahy Scale: Good     Standing balance support: No upper extremity supported;During functional activity Standing balance-Leahy Scale: Good Standing balance comment: able to mobilize without AD though balance limitations evident                           ADL either performed or assessed with clinical judgement   ADL Overall ADL's : Needs assistance/impaired Eating/Feeding: Independent;Sitting   Grooming: Set up;Standing Grooming Details (indicate cue type and reason): Setup of items, able to stand for various grooming tasks > 5 min Upper Body Bathing: Independent;Sitting   Lower Body Bathing: Supervison/ safety;Sit to/from stand   Upper Body Dressing : Independent;Sitting   Lower Body Dressing: Supervision/safety;Sit to/from stand   Toilet Transfer: Min guard;Ambulation Toilet Transfer Details (indicate cue type and reason): min guard without AD, some unsteadiness noted Toileting- Clothing Manipulation and Hygiene: Supervision/safety;Sit to/from stand Toileting - Clothing Manipulation Details (indicate cue type and reason): Supervision for safety, no physical assist given (except chest tube mgmt). Able to manage clothing, hygiene after BM     Functional mobility during ADLs: Min guard General ADL Comments: Pt with minor deficits in  standing balance, endurance and cardiopulmonary tolerance     Vision Baseline Vision/History: Wears glasses Patient Visual Report: No change from baseline Vision Assessment?: No apparent visual deficits     Perception      Praxis      Pertinent Vitals/Pain Pain Assessment: No/denies pain     Hand Dominance Right   Extremity/Trunk Assessment Upper Extremity Assessment Upper Extremity Assessment: Overall WFL for tasks assessed;LUE deficits/detail LUE Deficits / Details: chest tube L side, minor ROM limitations due to this   Lower Extremity Assessment Lower Extremity Assessment: Defer to PT evaluation   Cervical / Trunk Assessment Cervical / Trunk Assessment: Normal   Communication Communication Communication: No difficulties   Cognition Arousal/Alertness: Awake/alert Behavior During Therapy: WFL for tasks assessed/performed Overall Cognitive Status: Impaired/Different from baseline Area of Impairment: Problem solving                             Problem Solving: Slow processing;Difficulty sequencing General Comments: A&Ox4, pleasant and cooperative.A bit slow with problem solving and awareness though is aware of slower speed, balance deficits, etc   General Comments  Pt received on 1 L O2, SpO2 93%. Trialed on RA for ADLs in room, pulse ox reading at 80% (1/4 DOE noted after activity). Applied 1 L O2 with no O2 changes, cross checked with dynamap which reports SpO2 as 99% on 1 L O2. RN informed    Exercises     Shoulder Instructions      Home Living Family/patient expects to be discharged to:: Private residence Living Arrangements: Spouse/significant other Available Help at Discharge: Family;Available 24 hours/day Type of Home: House Home Access: Level entry     Home Layout: Two level;Bed/bath upstairs     Bathroom Shower/Tub: Occupational psychologist: Standard     Home Equipment: None   Additional Comments: Reports he may have access to cane, RW and shower chair      Prior Functioning/Environment Level of Independence: Independent        Comments: No use of AD. 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 Problem List: Decreased  strength;Decreased activity tolerance;Impaired balance (sitting and/or standing);Cardiopulmonary status limiting activity      OT Treatment/Interventions: Self-care/ADL training;Therapeutic exercise;DME and/or AE instruction;Therapeutic activities;Patient/family education;Balance training    OT Goals(Current goals can be found in the care plan section) Acute Rehab OT Goals Patient Stated Goal: get well, be able to go home when ready OT Goal Formulation: With patient Time For Goal Achievement: 04/08/20 Potential to Achieve Goals: Good ADL Goals Pt Will Perform Grooming: Independently;standing Pt Will Perform Lower Body Dressing: Independently;sit to/from stand Pt Will Transfer to Toilet: Independently;ambulating Additional ADL Goal #1: Pt increase standing activity tolerance during ADLs/mobility to > 10 min to improve overall endurance Additional ADL Goal #2: Pt to demonstrate ability to independently monitor O2 levels and implement pursed lip breathing as needed to maintain SpO2 > 90%  OT Frequency: Min 2X/week   Barriers to D/C:            Co-evaluation              AM-PAC OT "6 Clicks" Daily Activity     Outcome Measure Help from another person eating meals?: None Help from another person taking care of personal grooming?: A Little Help from another person toileting, which includes using toliet, bedpan, or urinal?: A Little Help from another person bathing (  including washing, rinsing, drying)?: A Little Help from another person to put on and taking off regular upper body clothing?: None Help from another person to put on and taking off regular lower body clothing?: A Little 6 Click Score: 20   End of Session Equipment Utilized During Treatment: Oxygen Nurse Communication: Mobility status;Other (comment) (O2)  Activity Tolerance: Patient tolerated treatment well Patient left: in chair;with call bell/phone within reach  OT Visit Diagnosis: Unsteadiness on feet  (R26.81);Other abnormalities of gait and mobility (R26.89);Muscle weakness (generalized) (M62.81)                Time: [id number] OT Time Calculation (min): 28 min Charges:  OT General Charges $OT Visit: 1 Visit OT Evaluation $OT Eval Moderate Complexity: 1 Mod OT Treatments $Self Care/Home Management : 8-22 mins  Malachy Chamber, OTR/L Acute Rehab Services Office: [phone]  Layla Maw 03/25/2020, 9:42 AM

## 2020-03-25 NOTE — Progress Notes (Addendum)
HD#4 Subjective:  Overnight Events: No acute events  Patient states he feels much better than when he was admitted. Denies drainage coming from around chest tube site. Denies significant pain.   Objective:  Vital signs in last 24 hours: Vitals:   03/24/20 1100 03/24/20 1125 03/24/20 2002 03/25/20 0507  BP: 132/72 124/68 (!) 121/59 130/66  Pulse:   88 90  Resp: '15 19 18 18  '$ Temp:  97.7 F (36.5 C) 98.8 F (37.1 C) 98.8 F (37.1 C)  TempSrc:  Oral Oral Oral  SpO2:   91% 99%  Weight:      Height:        Physical Exam:  Constitutional: well-appearing,  in no acute distress HENT: normocephalic atraumatic Neck: supple Cardiovascular: regular rate and rhythm Pulmonary/Chest: normal work of breathing on room air MSK: normal bulk and tone Neurological: alert & oriented x 3 Skin: warm and dry.  Psych: Normal thought process  Filed Weights   03/21/20 1321 03/21/20 1412 03/24/20 0806  Weight: 79.4 kg 77.8 kg 75.6 kg     Intake/Output Summary (Last 24 hours) at 03/25/2020 0651 Last data filed at 03/24/2020 2004 Gross per 24 hour  Intake 280.15 ml  Output 2500 ml  Net -2219.85 ml   Net IO Since Admission: -5,479.14 mL [03/25/20 0651]  Pertinent Labs: CBC Latest Ref Rng & Units 03/25/2020 03/24/2020 03/23/2020  WBC 4.0 - 10.5 K/uL 10.2 10.6(H) 8.5  Hemoglobin 13.0 - 17.0 g/dL 10.5(L) 10.7(L) 9.6(L)  Hematocrit 39.0 - 52.0 % 31.5(L) 31.5(L) 28.1(L)  Platelets 150 - 400 K/uL 455(H) 474(H) 406(H)    CMP Latest Ref Rng & Units 03/25/2020 03/24/2020 03/23/2020  Glucose 70 - 99 mg/dL 174(H) 69(L) 102(H)  BUN 8 - 23 mg/dL 27(H) 44(H) 36(H)  Creatinine 0.61 - 1.24 mg/dL 5.88(H) 7.52(H) 6.65(H)  Sodium 135 - 145 mmol/L 134(L) 131(L) 133(L)  Potassium 3.5 - 5.1 mmol/L 3.9 4.8 4.2  Chloride 98 - 111 mmol/L 95(L) 95(L) 93(L)  CO2 22 - 32 mmol/L '26 23 27  '$ Calcium 8.9 - 10.3 mg/dL 9.1 9.3 9.2  Total Protein 6.5 - 8.1 g/dL - - -  Total Bilirubin 0.3 - 1.2 mg/dL - - -  Alkaline Phos  38 - 126 U/L - - -  AST 15 - 41 U/L - - -  ALT 0 - 44 U/L - - -    Imaging: No results found.  Assessment/Plan:   Principal Problem:   Pleural effusion on left Active Problems:   Pleural effusion   DM (diabetes mellitus), type 2, uncontrolled, with renal complications (HCC)   Acute hypoxemic respiratory failure (HCC)   ESRD (end stage renal disease) on dialysis ([place])   Biventricular heart failure, NYHA class 3 ([place])   Patient Summary: Mr. Elger Costas is 65yo person with ESRD on MWF HD, chronic systolic and diastolic heart failure, type II diabetes mellitus, secondary hyperparathyroidism, iron deficiency anemia, hypertension admitted 2/25 with loculated left pleural effusion, concerning for possible infectious etiology  #Loculated left pleural effusion s/p chest tube placement 2/26 #Empyema vs Parapneumonic Effusion #Basilar Pneumothorax Overnight no acute events, remains afebrile and hemodynamically stable. Cultures growing rare staph aureus, vancomycin started 02/27. Pleural LDH ~7000. Suggestive of exudative fluid. No malignant cells seen on cytology. Due to frank pus being seen by PCCM when chest tube placed, would indicate presence of empyema. Will treat for 4-6 weeks with abx per recommendations. Continue to monitor pneumothorax, repeat CT Lungs pending at this time. PCCM following, appreciate their  recommendations - C/w vancomycin with dialysis for 4-6 weeks. - Cytology negative for malignant cells  #ESRD on MWF Appears euvolemic on exam today. Electrolytes within normal limits. Appreciate nephrology assistance.  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 Patient reportedly had NM stress test afterwards but unable to locate in chart. Likely will need further ischemic eval/catheterization in outpatient setting, especially since he has plans to undergo kidney transplantation in the future. - F/u outpatient cardiology for  ischemic evaluation  #Type II diabetes mellitus A1c 9.7 on admission. Patient was started on 5 units but became hypoglycemic so it was discontinued. However, he became hyperglycemic today and we will restart him on 2 U lantus.  - Holding home glimepiride - would recommend d/c on discharge given his ESRD (contraindicated). Could consider changing to low dose glipizide. But would recommend GLP-1 agonist +/- low dose insulin. Will need optimization prior to transplant surgery.  - start 2 U lantus nightly - SSI  #Chronic normocytic anemia Based on iron studies this admission, most likely anemia due to chronic disease. No obvious signs of bleeding. Will continue to monitor. - Daily CBC  DIET: HH IVF: n/a DVT PPX: Heparin BOWEL: n/a CODE: FULL FAM COM: n/a  Prior to Admission Living Arrangement: Home Anticipated Discharge Location: pending PT/OT eval Barriers to Discharge: medical management Dispo: Anticipated discharge in approximately 2-3 day(s).   Dispo: Anticipated discharge to Home in 3-4 days pending further work up of his empyema  [place] Internal Medicine Resident PGY-1 Pager [phone] Please contact the on call pager after 5 pm and on weekends at [phone].

## 2020-03-25 NOTE — Evaluation (Signed)
Physical Therapy Evaluation Patient Details Name: Kyle [name] MRN: [id number] DOB: 28-Nov-1955 Today's Date: 03/25/2020   History of Present Illness  65 yo admitted 2/25 with chest tightness found to have left loculated pleural effusion not conducive to thoracentesis with pigtail catheter 2/26. PMhx: ESRD, HTN, HLD, DM, biventricular HF EF 10%, CAD, pulmonary HTN  Clinical Impression  Pt pleasant and reports progressive fatigue for a few weeks leading to admission. 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 Pt with SpO2 89% on arrival on RA with 2L applied and improvement to 94%. Pt maintained 92-96% on 2L during gait and 92% at rest on 1L. Pt with decreased activity tolerance, balance and cardiopulmonary function who will benefit from acute therapy to maximize mobility, safety and function. Pt encouraged to be up to bathroom with assist. Pt educated for chest tube to suction in room and placement with transfers.     Follow Up Recommendations Home health PT    Equipment Recommendations  Other (comment) (TBD potential for AD)    Recommendations for Other Services       Precautions / Restrictions Precautions Precautions: Fall;Other (comment) Precaution Comments: monitor O2, chest tube Restrictions Weight Bearing Restrictions: No      Mobility  Bed Mobility Overal bed mobility: Modified Independent             General bed mobility comments: supervision of chest tube    Transfers Overall transfer level: Needs assistance Equipment used: None Transfers: Sit to/from Stand Sit to Stand: Min guard Stand pivot transfers: Min guard       General transfer comment: guarding for chest tube and minor instability in standing, no physical assist  Ambulation/Gait Ambulation/Gait assistance: Min guard Gait Distance (Feet): 300 Feet Assistive device: None Gait Pattern/deviations: Step-through pattern;Decreased stride length   Gait  velocity interpretation: <1.8 ft/sec, indicate of risk for recurrent falls General Gait Details: pt with decreased stride and speed with repeated posterior right LOB with tactile cues to correct. Assist for O2 and chest tube  Stairs            Wheelchair Mobility    Modified Rankin (Stroke Patients Only)       Balance Overall balance assessment: Needs assistance Sitting-balance support: No upper extremity supported;Feet supported Sitting balance-Leahy Scale: Normal     Standing balance support: No upper extremity supported;During functional activity Standing balance-Leahy Scale: Good Standing balance comment: pt able to stand and walk but noted partial LOB throughout gait                             Pertinent Vitals/Pain Pain Assessment: No/denies pain    Home Living Family/patient expects to be discharged to:: Private residence Living Arrangements: Spouse/significant other Available Help at Discharge: Family;Available 24 hours/day Type of Home: House Home Access: Level entry     Home Layout: Two level;Bed/bath upstairs Home Equipment: None Additional Comments: Reports he may have access to cane, RW and shower chair    Prior Function Level of Independence: Independent              Hand Dominance   Dominant Hand: Right    Extremity/Trunk Assessment   Upper Extremity Assessment Upper Extremity Assessment: Overall WFL for tasks assessed LUE Deficits / Details:     Lower Extremity Assessment Lower Extremity Assessment: Overall WFL for tasks assessed    Cervical / Trunk Assessment Cervical / Trunk Assessment:  Normal  Communication   Communication: No difficulties  Cognition Arousal/Alertness: Awake/alert Behavior During Therapy: WFL for tasks assessed/performed Overall Cognitive Status: Within Functional Limits for tasks assessed Area of Impairment: Problem solving                                  General Comments     Exercises     Assessment/Plan    PT Assessment Patient needs continued PT services  PT Problem List Decreased mobility;Decreased balance;Decreased activity tolerance;Cardiopulmonary status limiting activity       PT Treatment Interventions Gait training;Stair training;Functional mobility training;Therapeutic activities;Patient/family education;Balance training;Therapeutic exercise;DME instruction    PT Goals (Current goals can be found in the Care Plan section)  Acute Rehab PT Goals Patient Stated Goal: return home and watch sports PT Goal Formulation: With patient Time For Goal Achievement: 04/08/20 Potential to Achieve Goals: Good    Frequency Min 3X/week   Barriers to discharge        Co-evaluation               AM-PAC PT "6 Clicks" Mobility  Outcome Measure Help needed turning from your back to your side while in a flat bed without using bedrails?: None Help needed moving from lying on your back to sitting on the side of a flat bed without using bedrails?: None Help needed moving to and from a bed to a chair (including a wheelchair)?: A Little Help needed standing up from a chair using your arms (e.g., wheelchair or bedside chair)?: A Little Help needed to walk in hospital room?: A Little Help needed climbing 3-5 steps with a railing? : A Little 6 Click Score: 20    End of Session Equipment Utilized During Treatment: Oxygen Activity Tolerance: Patient tolerated treatment well Patient left: in chair;with call bell/phone within reach Nurse Communication: Mobility status PT Visit Diagnosis: Other abnormalities of gait and mobility (R26.89);Difficulty in walking, not elsewhere classified (R26.2)    Time: [id number] PT Time Calculation (min) (ACUTE ONLY): 25 min   Charges:   PT Evaluation $PT Eval Moderate Complexity: 1 Mod PT Treatments $Gait Training: 8-22 mins        Maija P, PT Acute Rehabilitation Services Pager: [phone] Office:  [place] 03/25/2020, 10:01 AM

## 2020-03-25 NOTE — Care Management Important Message (Signed)
Important Message  Patient Details  Name: Kyle [name] MRN: [id number] Date of Birth: 02/26/1955   Medicare Important Message Given:  Yes     Orbie Pyo 03/25/2020, 3:30 PM

## 2020-03-25 NOTE — Plan of Care (Signed)

## 2020-03-25 NOTE — Progress Notes (Addendum)
SATURATION QUALIFICATIONS: (This note is used to comply with regulatory documentation for home oxygen)  Patient Saturations on Room Air at Rest = 88%  Patient Saturations on 2 while Ambulating = 93%  Patient Saturations on 1 Liters of oxygen at rest = 92%   Pt currently requires 1L at rest and 2L during activity to maintain saturation >90% Kyle [name], PT Acute Rehabilitation Services Pager: [phone] Office: [phone]

## 2020-03-25 NOTE — Progress Notes (Signed)
NAME:  Kyle [name], MRN:  [id number], DOB:  1955/09/06, LOS: 4 ADMISSION DATE:  03/21/2020, CONSULTATION DATE:  2/26 REFERRING MD:  Macon Large, CHIEF COMPLAINT:  Chest tightness, dyspnea   Brief History:  65 y/o male admitted with chest tightness, dyspnea found to have a moderate to large left pleural effusion of uncertain etiology. At baseline has ESRD.  Past Medical History:  ESRD on HD DM2 HTN HLD BiV HF CAD Pulmonary hypertension   Significant Hospital Events:  2/25 admitted to IMTS. L Pleural effusion not amenable to thora with IR due to loculated appearance 2/26 CT chest confirms complex, loculated effusion. PCCM consult for possible chest tube, management of effusion   Consults:  IR PCCM  Procedures:  2/26 pigtail (planned)  Significant Diagnostic Tests:  2/26 CT chest w con> Moderate L pleural effusion with pleural thickening, similar in appearance to Ct in 2021. Trace R effusion. Bilateral consolidative opacities (suspected rounded atelectasis). Cardiomegaly, small pericardial effusion. 57m focal pulm nodule. Three vessel CAD.   Micro Data:  2/25 SARS Cov2> neg 2/27 left pleural effusion >   Antimicrobials:     Interim History / Subjective:   Able to ambulate on room air.  O2 sats dropped to 80%.   Objective   Blood pressure 130/66, pulse 90, temperature 98.8 F (37.1 C), temperature source Oral, resp. rate 18, height 6' (1.829 m), weight 75.6 kg, SpO2 99 %.        Intake/Output Summary (Last 24 hours) at 03/25/2020 1006 Last data filed at 03/25/2020 0958 Gross per 24 hour  Intake 530.15 ml  Output 2550 ml  Net -2019.85 ml   Filed Weights   03/21/20 1321 03/21/20 1412 03/24/20 0806  Weight: 79.4 kg 77.8 kg 75.6 kg    Examination: General: Able to ambulate without distress HENT: No JVD lymphadenopathy is appreciated PULM: Decreased breath sounds left side, chest tube in place.  No air leaks appreciated.  1600 cc of drainage noted CV:  Heart sounds are regular GI: Soft nontender Neuro: No focal defects appreciated  03/25/2020 no chest x-ray available 2/29 cxr with left trapped lung     Resolved Hospital Problem list     Assessment & Plan:  Empyema left lung, unclear etiology 2/28 bedside ultrasound> no fluid left lung Lung entrapment, don't think it will re-expand Hypoxia -Consider repeat CT scan -May need thoracic intervention -Oxygen as needed  Labs   CBC: Recent Labs  Lab 03/21/20 0841 03/22/20 0222 03/22/20 1757 03/23/20 0755 03/24/20 0348 03/25/20 0242  WBC 10.3 8.9 9.7 8.5 10.6* 10.2  NEUTROABS 9.0* 7.7 8.4*  --   --   --   HGB 9.1* 8.8* 9.9* 9.6* 10.7* 10.5*  HCT 26.7* 25.3* 28.5* 28.1* 31.5* 31.5*  MCV 95.4 93.7 94.4 93.7 94.0 94.6  PLT 433* 412* 548* 406* 474* 455*    Basic Metabolic Panel: Recent Labs  Lab 03/21/20 0841 03/22/20 0222 03/23/20 0755 03/24/20 0348 03/25/20 0242  NA 133* 131* 133* 131* 134*  K 3.9 3.9 4.2 4.8 3.9  CL 90* 94* 93* 95* 95*  CO2 '29 28 27 23 26  '$ GLUCOSE 244* 241* 102* 69* 174*  BUN 35* 23 36* 44* 27*  CREATININE 6.62* 5.03* 6.65* 7.52* 5.88*  CALCIUM 9.2 8.8* 9.2 9.3 9.1  MG  --  2.0  --   --   --   PHOS  --   --   --  3.8 3.6   GFR: Estimated Creatinine Clearance: 13.6 mL/min (A) (by  C-G formula based on SCr of 5.88 mg/dL (H)). Recent Labs  Lab 03/22/20 1757 03/23/20 0755 03/24/20 0348 03/25/20 0242  WBC 9.7 8.5 10.6* 10.2    Liver Function Tests: Recent Labs  Lab 03/21/20 1213 03/22/20 0222 03/24/20 0348 03/25/20 0242  AST 27 22  --   --   ALT 25 23  --   --   ALKPHOS 137* 131*  --   --   BILITOT 0.9 0.9  --   --   PROT 6.8 6.7  --   --   ALBUMIN 2.1* 2.0* 2.0* 1.9*   No results for input(s): LIPASE, AMYLASE in the last 168 hours. No results for input(s): AMMONIA in the last 168 hours.  ABG    Component Value Date/Time   PHART 7.428 09/04/2014 1040   PCO2ART 41.0 09/04/2014 1040   PO2ART 72.0 (L) 09/04/2014 1040   HCO3  26.9 (H) 09/04/2014 1043   TCO2 31 12/04/2015 0646   ACIDBASEDEF 1.0 08/25/2014 1045   O2SAT 69.0 09/04/2014 1043     Coagulation Profile: No results for input(s): INR, PROTIME in the last 168 hours.  Cardiac Enzymes: No results for input(s): CKTOTAL, CKMB, CKMBINDEX, TROPONINI in the last 168 hours.  HbA1C: Hgb A1c MFr Bld  Date/Time Value Ref Range Status  03/21/2020 11:44 AM 9.7 (H) 4.8 - 5.6 % Final    Comment:    (NOTE) Pre diabetes:          5.7%-6.4%  Diabetes:              >6.4%  Glycemic control for   <7.0% adults with diabetes   08/20/2014 08:58 PM 7.6 (H) 4.8 - 5.6 % Final    Comment:    (NOTE)         Pre-diabetes: 5.7 - 6.4         Diabetes: >6.4         Glycemic control for adults with diabetes: <7.0     CBG: Recent Labs  Lab 03/24/20 0723 03/24/20 1215 03/24/20 1616 03/24/20 1954 03/25/20 0801  GLUCAP 120* 109* 122* 157* 229*     Critical care time: n/a     Richardson Landry Minor ACNP Acute Care Nurse Practitioner [place] Please consult Amion 03/25/2020, 10:06 AM

## 2020-03-25 NOTE — Progress Notes (Signed)
Subjective:  HD yest- removed 2 liters- also chest tube appears to still be putting out - feeling better Objective Vital signs in last 24 hours: Vitals:   03/24/20 1100 03/24/20 1125 03/24/20 2002 03/25/20 0507  BP: 132/72 124/68 (!) 121/59 130/66  Pulse:   88 90  Resp: '15 19 18 18  '$ Temp:  97.7 F (36.5 C) 98.8 F (37.1 C) 98.8 F (37.1 C)  TempSrc:  Oral Oral Oral  SpO2:   91% 99%  Weight:      Height:       Weight change:   Intake/Output Summary (Last 24 hours) at 03/25/2020 0816 Last data filed at 03/25/2020 0700 Gross per 24 hour  Intake 290.15 ml  Output 2550 ml  Net -2259.85 ml    OP HD:AF MWF 4h 450/500 75.5kg 2/2.25 bath LUE AVF Hep 7000 -mircera 30 q4wk, last 2/7 - hect 3ug tiw  Assessment/ Plan: Pt is a 65 y.o. yo male with ESRD, cardiomyopathy who was admitted on 03/21/2020 with pleural effusion/empyema requiring chest tube  Assessment/Plan: 1. Pleural effusion Ilda Basset-  S/p chest tube-  Feeling better-  Pulm on board-  Culture growth not complete - on vanc- not sure what long term plan is 2. ESRD - continuing on home MWF schedule-  Next due tomorrow via AVF 3. Anemia- hgb at goal-  On low dose ESA q Monday 4. Secondary hyperparathyroidism- cont hectorol and phoslo - numbers at goal  5. HTN/volume-  Had been trying to keep as dry as possible as OP-  Under EDW- will need adjust as OP   Louis Meckel    Labs: Basic Metabolic Panel: Recent Labs  Lab 03/23/20 0755 03/24/20 0348 03/25/20 0242  NA 133* 131* 134*  K 4.2 4.8 3.9  CL 93* 95* 95*  CO2 '27 23 26  '$ GLUCOSE 102* 69* 174*  BUN 36* 44* 27*  CREATININE 6.65* 7.52* 5.88*  CALCIUM 9.2 9.3 9.1  PHOS  --  3.8 3.6   Liver Function Tests: Recent Labs  Lab 03/21/20 1213 03/22/20 0222 03/24/20 0348 03/25/20 0242  AST 27 22  --   --   ALT 25 23  --   --   ALKPHOS 137* 131*  --   --   BILITOT 0.9 0.9  --   --   PROT 6.8 6.7  --   --   ALBUMIN 2.1* 2.0* 2.0* 1.9*   No results for  input(s): LIPASE, AMYLASE in the last 168 hours. No results for input(s): AMMONIA in the last 168 hours. CBC: Recent Labs  Lab 03/21/20 0841 03/22/20 0222 03/22/20 1757 03/23/20 0755 03/24/20 0348 03/25/20 0242  WBC 10.3 8.9 9.7 8.5 10.6* 10.2  NEUTROABS 9.0* 7.7 8.4*  --   --   --   HGB 9.1* 8.8* 9.9* 9.6* 10.7* 10.5*  HCT 26.7* 25.3* 28.5* 28.1* 31.5* 31.5*  MCV 95.4 93.7 94.4 93.7 94.0 94.6  PLT 433* 412* 548* 406* 474* 455*   Cardiac Enzymes: No results for input(s): CKTOTAL, CKMB, CKMBINDEX, TROPONINI in the last 168 hours. CBG: Recent Labs  Lab 03/24/20 0723 03/24/20 1215 03/24/20 1616 03/24/20 1954 03/25/20 0801  GLUCAP 120* 109* 122* 157* 229*    Iron Studies:  Recent Labs    03/22/20 0834  IRON 25*  TIBC 119*  FERRITIN 2,156*   Studies/Results: DG CHEST PORT 1 VIEW  Result Date: 03/24/2020 CLINICAL DATA:  Left pleural effusion EXAM: PORTABLE CHEST 1 VIEW COMPARISON:  03/23/2020 FINDINGS: Left pigtail pleural catheter  is again identified. Similar basilar pneumothorax. Similar opacification of the left lung base reflecting pleural effusion, pleural thickening, and atelectasis/consolidation as seen on recent chest CT. Small right pleural effusion and rounded opacities within the right lung are unchanged. Stable cardiomediastinal contours. Right axillary vascular stent. IMPRESSION: No substantial change since 03/23/2020. Similar left basilar pneumothorax and left pleural effusion with chest tube present. Electronically Signed   By: Macy Mis M.D.   On: 03/24/2020 08:08   Medications: Infusions: . vancomycin Stopped (03/24/20 1225)    Scheduled Medications: . aspirin EC  81 mg Oral Daily  . calcium acetate  667 mg Oral TID WC  . Chlorhexidine Gluconate Cloth  6 each Topical Q0600  . [START ON 03/31/2020] darbepoetin (ARANESP) injection - DIALYSIS  40 mcg Intravenous Q Mon-HD  . doxercalciferol  3 mcg Intravenous Q M,W,F-HD  . heparin  5,000 Units  Subcutaneous Q8H  . insulin aspart  0-15 Units Subcutaneous TID WC  . rosuvastatin  5 mg Oral Daily  . sodium chloride flush  10 mL Intracatheter Q8H    have reviewed scheduled and prn medications.  Physical Exam: General: NAD-  Heart: RRR Lungs: mostly clear-  CT in place Abdomen: soft, non tender Extremities: no edema Dialysis Access: left AVF    03/25/2020,8:16 AM  LOS: 4 days

## 2020-03-26 ENCOUNTER — Inpatient Hospital Stay (HOSPITAL_COMMUNITY): Payer: Medicare (Managed Care)

## 2020-03-26 DIAGNOSIS — J869 Pyothorax without fistula: Secondary | ICD-10-CM | POA: Diagnosis not present

## 2020-03-26 DIAGNOSIS — N186 End stage renal disease: Secondary | ICD-10-CM | POA: Diagnosis not present

## 2020-03-26 LAB — BODY FLUID CULTURE W GRAM STAIN

## 2020-03-26 LAB — CBC
HCT: 28.5 % — ABNORMAL LOW (ref 39.0–52.0)
Hemoglobin: 9.8 g/dL — ABNORMAL LOW (ref 13.0–17.0)
MCH: 31.9 pg (ref 26.0–34.0)
MCHC: 34.4 g/dL (ref 30.0–36.0)
MCV: 92.8 fL (ref 80.0–100.0)
Platelets: 428 10*3/uL — ABNORMAL HIGH (ref 150–400)
RBC: 3.07 MIL/uL — ABNORMAL LOW (ref 4.22–5.81)
RDW: 15.4 % (ref 11.5–15.5)
WBC: 10.3 10*3/uL (ref 4.0–10.5)
nRBC: 0 % (ref 0.0–0.2)

## 2020-03-26 LAB — RENAL FUNCTION PANEL
Albumin: 1.9 g/dL — ABNORMAL LOW (ref 3.5–5.0)
Anion gap: 14 (ref 5–15)
BUN: 40 mg/dL — ABNORMAL HIGH (ref 8–23)
CO2: 23 mmol/L (ref 22–32)
Calcium: 9.2 mg/dL (ref 8.9–10.3)
Chloride: 96 mmol/L — ABNORMAL LOW (ref 98–111)
Creatinine, Ser: 7.18 mg/dL — ABNORMAL HIGH (ref 0.61–1.24)
GFR, Estimated: 8 mL/min — ABNORMAL LOW (ref >60)
Glucose, Bld: 125 mg/dL — ABNORMAL HIGH (ref 70–99)
Phosphorus: 3.5 mg/dL (ref 2.5–4.6)
Potassium: 4.3 mmol/L (ref 3.5–5.1)
Sodium: 133 mmol/L — ABNORMAL LOW (ref 135–145)

## 2020-03-26 LAB — GLUCOSE, CAPILLARY
Glucose-Capillary: 111 mg/dL — ABNORMAL HIGH (ref 70–99)
Glucose-Capillary: 63 mg/dL — ABNORMAL LOW (ref 70–99)
Glucose-Capillary: 149 mg/dL — ABNORMAL HIGH (ref 70–99)
Glucose-Capillary: 205 mg/dL — ABNORMAL HIGH (ref 70–99)

## 2020-03-26 MED ORDER — DOXERCALCIFEROL 4 MCG/2ML IV SOLN
INTRAVENOUS | Status: AC
  Filled 2020-03-26: qty 2

## 2020-03-26 MED ORDER — HEPARIN SODIUM (PORCINE) 1000 UNIT/ML DIALYSIS
3000.0000 [IU] | INTRAMUSCULAR | Status: DC | PRN
  Filled 2020-03-26: qty 3

## 2020-03-26 MED ORDER — CHLORHEXIDINE GLUCONATE CLOTH 2 % EX PADS
6.0000 | MEDICATED_PAD | Freq: Every day | CUTANEOUS | Status: DC
  Administered 2020-03-26: 6 via TOPICAL

## 2020-03-26 MED ORDER — HEPARIN SODIUM (PORCINE) 1000 UNIT/ML DIALYSIS
20.0000 [IU]/kg | INTRAMUSCULAR | Status: DC | PRN
  Filled 2020-03-26: qty 2

## 2020-03-26 NOTE — Progress Notes (Deleted)
HD#5 Subjective:  Overnight Events: No acute events overnight  [name] states he is doing well this morning. Denies any pain around his chest tube insertion or leaking from insertion point. He states drainage from tube has become lighter in color. Denies shortness of breath. He notes that his wife has been visiting him daily. Discussed with him the plan for today would be dialysis and to continue to monitor his chest tube output. He has no acute complaints or concerns this morning.    Objective:  Vital signs in last 24 hours: Vitals:   03/25/20 0507 03/25/20 1358 03/25/20 2117 03/26/20 0601  BP: 130/66 129/68 136/71 (!) 141/83  Pulse: 90 87 96 92  Resp: '18 16 17 17  '$ Temp: 98.8 F (37.1 C)  98.4 F (36.9 C) 97.8 F (36.6 C)  TempSrc: Oral   Oral  SpO2: 99% 97% 94% 95%  Weight:      Height:        Physical Exam:  Constitutional: well-appearing,  in no acute distress HENT: normocephalic atraumatic Neck: supple Cardiovascular: regular rate and rhythm Pulmonary/Chest: normal work of breathing on room air MSK: normal bulk and tone Neurological: alert & oriented x 3 Skin: warm and dry. Left chest tube in place.  Psych: Normal thought process  Filed Weights   03/21/20 1321 03/21/20 1412 03/24/20 0806  Weight: 79.4 kg 77.8 kg 75.6 kg     Intake/Output Summary (Last 24 hours) at 03/26/2020 1127 Last data filed at 03/26/2020 0806 Gross per 24 hour  Intake 140 ml  Output 280 ml  Net -140 ml   Net IO Since Admission: -5,419.14 mL [03/26/20 1127]  Pertinent Labs: CBC Latest Ref Rng & Units 03/26/2020 03/25/2020 03/24/2020  WBC 4.0 - 10.5 K/uL 10.3 10.2 10.6(H)  Hemoglobin 13.0 - 17.0 g/dL 9.8(L) 10.5(L) 10.7(L)  Hematocrit 39.0 - 52.0 % 28.5(L) 31.5(L) 31.5(L)  Platelets 150 - 400 K/uL 428(H) 455(H) 474(H)    CMP Latest Ref Rng & Units 03/26/2020 03/25/2020 03/24/2020  Glucose 70 - 99 mg/dL 125(H) 174(H) 69(L)  BUN 8 - 23 mg/dL 40(H) 27(H) 44(H)  Creatinine 0.61 - 1.24 mg/dL  7.18(H) 5.88(H) 7.52(H)  Sodium 135 - 145 mmol/L 133(L) 134(L) 131(L)  Potassium 3.5 - 5.1 mmol/L 4.3 3.9 4.8  Chloride 98 - 111 mmol/L 96(L) 95(L) 95(L)  CO2 22 - 32 mmol/L '23 26 23  '$ Calcium 8.9 - 10.3 mg/dL 9.2 9.1 9.3  Total Protein 6.5 - 8.1 g/dL - - -  Total Bilirubin 0.3 - 1.2 mg/dL - - -  Alkaline Phos 38 - 126 U/L - - -  AST 15 - 41 U/L - - -  ALT 0 - 44 U/L - - -    Imaging: CT CHEST WO CONTRAST  Result Date: 03/25/2020 CLINICAL DATA:  Empyema.  Prior chest tube placement. EXAM: CT CHEST WITHOUT CONTRAST TECHNIQUE: Multidetector CT imaging of the chest was performed following the standard protocol without IV contrast. COMPARISON:  03/22/2020 FINDINGS: Cardiovascular: Bilateral common carotid atherosclerotic calcification. Coronary, aortic arch, and branch vessel atherosclerotic vascular disease. Moderate cardiomegaly. Small pericardial effusion, increased from prior. Right axillary stent noted. Mediastinum/Nodes: Diffuse stranding in the mediastinum is nonspecific but unchanged. Stable small thyroid nodules, less than 1.5 cm in diameter. Not clinically significant; no follow-up imaging recommended (ref: J Am Coll Radiol. 2015 Feb;12(2): 143-50). Lungs/Pleura: Small right pleural effusion is essentially stable. There continues to be mild subpleural nodularity posteriorly along the right upper lobe margin of the pleural thickening.  There is left pleural thickening along with a left chest tube in place within a collection of gas and frothy fluid in the left pleural space favoring empyema. Previously this was fluid-filled and with enhancing margins, and the volume has substantially decreased compared to 03/22/2020. There continues to be some airspace opacity in the lingula and left lower lobe along the margins of the empyema. Upper Abdomen: Unremarkable Musculoskeletal: Lower cervical and mild midthoracic spondylosis. IMPRESSION: 1. Significant reduction in size of the left empyema, with a left  pleural drainage catheter in place. There remains some frothy fluid along with a moderate amount of gas within the pleural space. 2. Stable small right pleural effusion. Adjacent pleural thickening and mild subpleural nodularity in the right upper lobe, unchanged. 3. Stable airspace opacity in the lingula and left lower lobe along the margins of the empyema. 4. Coronary, aortic arch, and branch vessel atherosclerotic vascular disease. Moderate cardiomegaly. Small pericardial effusion, increased from prior. 5. Diffuse stranding in the mediastinum is nonspecific but unchanged. 6. Aortic atherosclerosis. Aortic Atherosclerosis (ICD10-I70.0). Electronically Signed   By: Van Clines M.D.   On: 03/25/2020 18:06   DG CHEST PORT 1 VIEW  Result Date: 03/26/2020 CLINICAL DATA:  Pleural effusion.  Empyema. EXAM: PORTABLE CHEST 1 VIEW COMPARISON:  CT 03/25/2020.  Chest x-ray 03/25/2020. FINDINGS: Left chest tube in stable position. Stable left base pleural fluid and air collection consistent with known empyema again noted. No interim change. Stable bibasilar atelectasis/infiltrates and small right pleural effusion again noted without interim change. Heart size stable. Degenerative change thoracic spine. Right upper extremity vascular stents noted. IMPRESSION: 1. Left chest tube in stable position. Stable left base pleural fluid and air collection consistent with known empyema again noted. No interim change. 2. Stable bibasilar atelectasis/infiltrates and small right pleural effusion again noted without interim change. Electronically Signed   By: Marcello Moores  Register   On: 03/26/2020 07:08   DG CHEST PORT 1 VIEW  Result Date: 03/25/2020 CLINICAL DATA:  Follow-up pleural effusion EXAM: PORTABLE CHEST 1 VIEW COMPARISON:  03/24/2020 FINDINGS: Cardiac shadow is enlarged but stable. Pigtail catheter is again noted in the left base with basilar pneumothorax identified. Minimal pleural fluid is seen. Consolidation in the left  base is noted laterally. Small right-sided pleural effusion is seen. Some nodularity is noted in the right base similar to that noted on prior CT examination. IMPRESSION: Persistent left basilar pneumothorax. The remainder of the exam is stable from the prior study. Electronically Signed   By: Inez Catalina M.D.   On: 03/25/2020 15:26    Assessment/Plan:   Principal Problem:   Pleural effusion on left Active Problems:   Pleural effusion   DM (diabetes mellitus), type 2, uncontrolled, with renal complications (HCC)   Acute hypoxemic respiratory failure (HCC)   ESRD (end stage renal disease) on dialysis ([place])   Biventricular heart failure, NYHA class 3 ([place])   Patient Summary: [name] is 65yo person with ESRD on MWF HD, chronic systolic and diastolic heart failure, type II diabetes mellitus, secondary hyperparathyroidism, iron deficiency anemia, hypertension admitted 2/25 with loculated left pleural effusion, concerning for possible infectious etiology  #Empyema s/p chest tube placement 2/26 #Basilar Pneumothorax Overnight no acute events, remains afebrile and hemodynamically stable. Chest tube in place, -220 out last 24 hrs. Repeat CT Lungs 3/1 decreased size of left empyema. Continue to monitor pneumothorax, if no improvement after drainage from chest tube slows, may need VATS procedure. Morning chest x-ray done, chest tube in position,  stable left sided empyema.  PCCM following, appreciate their recommendations - C/w vancomycin with dialysis for 4 weeks (day 4). - pharmacy following for vanc dosing/trough, appreciate their assistance  - continue to track chest tube drainage - culture w/ gram stain, + few Staph aureus, repeating susceptibilities - fungal culture negative  #ESRD on MWF Appears euvolemic on exam today. Electrolytes within normal limits. Appreciate nephrology assistance.  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 Patient reportedly had NM stress test afterwards but unable to locate in chart. Likely will need further ischemic eval/catheterization in outpatient setting, especially since he has plans to undergo kidney transplantation in the future. - F/u outpatient cardiology for ischemic evaluation - strict I/O  #Type II diabetes mellitus A1c 9.7 on admission. Continuing with SSI-moderate and lantus 2U - Holding home glimepiride - would recommend d/c on discharge given his ESRD (contraindicated). Could consider changing to low dose glipizide. But would recommend GLP-1 agonist +/- low dose insulin. Will need optimization prior to transplant surgery.  - SSI-moderate, 2 U lantus nightly  #Chronic normocytic anemia Based on iron studies this admission, most likely anemia due to chronic disease. No obvious signs of bleeding. Will continue to monitor. - Daily CBC  DIET: HH IVF: n/a DVT PPX: Heparin BOWEL: n/a CODE: FULL FAM COM: n/a  Dispo: Anticipated discharge to Home in 3-4 days pending further work up of his empyema  [facility] Resident PGY-1 Pager [phone] Please contact the on call pager after 5 pm and on weekends at [phone].

## 2020-03-26 NOTE — Progress Notes (Signed)
Pharmacy Antibiotic Note  Kyle [name] is a 65 y.o. male admitted on 03/21/2020 with empyema.  Pharmacy has been consulted for Vancomycin dosing. Pt with ESRD on HD (MWF). Pt completed 3 hours of HD, as ordered, on Monday, plan for full session today.  Plan: Vancomycin '750mg'$  IV QHD (plan is for 4-6 weeks) Will f/u HD schedule/tolerance, micro data, and pt's clinical condition Vanc levels prn   Height: 6' (182.9 cm) Weight: 75.6 kg (166 lb 10.7 oz) IBW/kg (Calculated) : 77.6  Temp (24hrs), Avg:98.1 F (36.7 C), Min:97.8 F (36.6 C), Max:98.4 F (36.9 C)  Recent Labs  Lab 03/22/20 0222 03/22/20 1757 03/23/20 0755 03/24/20 0348 03/25/20 0242 03/26/20 0109  WBC 8.9 9.7 8.5 10.6* 10.2 10.3  CREATININE 5.03*  --  6.65* 7.52* 5.88* 7.18*    Estimated Creatinine Clearance: 11.1 mL/min (A) (by C-G formula based on SCr of 7.18 mg/dL (H)).    No Known Allergies  Antimicrobials this admission: 2/27 Vanc >>   Microbiology results: 2/27 Pleural fluid: Few SA (repeating sensitivities?)    Thank you for allowing [place] to participate in this patients care. Jens Som, PharmD 03/26/2020 11:11 AM  Please check [email or web address] for unit-specific pharmacy phone numbers.

## 2020-03-26 NOTE — Progress Notes (Signed)
Subjective:   chest tube appears to still be putting out - feeling better- no other issues  Objective Vital signs in last 24 hours: Vitals:   03/25/20 0507 03/25/20 1358 03/25/20 2117 03/26/20 0601  BP: 130/66 129/68 136/71 (!) 141/83  Pulse: 90 87 96 92  Resp: '18 16 17 17  '$ Temp: 98.8 F (37.1 C)  98.4 F (36.9 C) 97.8 F (36.6 C)  TempSrc: Oral   Oral  SpO2: 99% 97% 94% 95%  Weight:      Height:       Weight change:   Intake/Output Summary (Last 24 hours) at 03/26/2020 [id number] Last data filed at 03/26/2020 0645 Gross per 24 hour  Intake 370 ml  Output 280 ml  Net 90 ml    OP HD:AF MWF 4h 450/500 75.5kg 2/2.25 bath LUE AVF Hep 7000 -mircera 30 q4wk, last 2/7 - hect 3ug tiw  Assessment/ Plan: Pt is a 65 y.o. yo male with ESRD, cardiomyopathy who was admitted on 03/21/2020 with pleural effusion/empyema requiring chest tube  Assessment/Plan: 1. Pleural effusion Ilda Basset-  S/p chest tube-  Feeling better-  Pulm on board-  Culture growth not complete - on vanc, will need long term-  2. ESRD - continuing on home MWF schedule-  Next due today via AVF 3. Anemia- hgb close to goal-  On low dose ESA q Monday 4. Secondary hyperparathyroidism- cont hectorol and phoslo - numbers at goal  5. HTN/volume-  Had been trying to keep as dry as possible as OP-  Under EDW- will need adjust as OP   Louis Meckel    Labs: Basic Metabolic Panel: Recent Labs  Lab 03/24/20 0348 03/25/20 0242 03/26/20 0109  NA 131* 134* 133*  K 4.8 3.9 4.3  CL 95* 95* 96*  CO2 '23 26 23  '$ GLUCOSE 69* 174* 125*  BUN 44* 27* 40*  CREATININE 7.52* 5.88* 7.18*  CALCIUM 9.3 9.1 9.2  PHOS 3.8 3.6 3.5   Liver Function Tests: Recent Labs  Lab 03/21/20 1213 03/22/20 0222 03/24/20 0348 03/25/20 0242 03/26/20 0109  AST 27 22  --   --   --   ALT 25 23  --   --   --   ALKPHOS 137* 131*  --   --   --   BILITOT 0.9 0.9  --   --   --   PROT 6.8 6.7  --   --   --   ALBUMIN 2.1* 2.0* 2.0* 1.9* 1.9*    No results for input(s): LIPASE, AMYLASE in the last 168 hours. No results for input(s): AMMONIA in the last 168 hours. CBC: Recent Labs  Lab 03/21/20 0841 03/22/20 0222 03/22/20 1757 03/23/20 0755 03/24/20 0348 03/25/20 0242 03/26/20 0109  WBC 10.3 8.9 9.7 8.5 10.6* 10.2 10.3  NEUTROABS 9.0* 7.7 8.4*  --   --   --   --   HGB 9.1* 8.8* 9.9* 9.6* 10.7* 10.5* 9.8*  HCT 26.7* 25.3* 28.5* 28.1* 31.5* 31.5* 28.5*  MCV 95.4 93.7 94.4 93.7 94.0 94.6 92.8  PLT 433* 412* 548* 406* 474* 455* 428*   Cardiac Enzymes: No results for input(s): CKTOTAL, CKMB, CKMBINDEX, TROPONINI in the last 168 hours. CBG: Recent Labs  Lab 03/25/20 1116 03/25/20 1634 03/25/20 1707 03/25/20 2129 03/26/20 0800  GLUCAP 188* 69* 88 162* 111*    Iron Studies:  No results for input(s): IRON, TIBC, TRANSFERRIN, FERRITIN in the last 72 hours. Studies/Results: CT CHEST WO CONTRAST  Result Date: 03/25/2020 CLINICAL  DATA:  Empyema.  Prior chest tube placement. EXAM: CT CHEST WITHOUT CONTRAST TECHNIQUE: Multidetector CT imaging of the chest was performed following the standard protocol without IV contrast. COMPARISON:  03/22/2020 FINDINGS: Cardiovascular: Bilateral common carotid atherosclerotic calcification. Coronary, aortic arch, and branch vessel atherosclerotic vascular disease. Moderate cardiomegaly. Small pericardial effusion, increased from prior. Right axillary stent noted. Mediastinum/Nodes: Diffuse stranding in the mediastinum is nonspecific but unchanged. Stable small thyroid nodules, less than 1.5 cm in diameter. Not clinically significant; no follow-up imaging recommended (ref: J Am Coll Radiol. 2015 Feb;12(2): 143-50). Lungs/Pleura: Small right pleural effusion is essentially stable. There continues to be mild subpleural nodularity posteriorly along the right upper lobe margin of the pleural thickening. There is left pleural thickening along with a left chest tube in place within a collection of gas and  frothy fluid in the left pleural space favoring empyema. Previously this was fluid-filled and with enhancing margins, and the volume has substantially decreased compared to 03/22/2020. There continues to be some airspace opacity in the lingula and left lower lobe along the margins of the empyema. Upper Abdomen: Unremarkable Musculoskeletal: Lower cervical and mild midthoracic spondylosis. IMPRESSION: 1. Significant reduction in size of the left empyema, with a left pleural drainage catheter in place. There remains some frothy fluid along with a moderate amount of gas within the pleural space. 2. Stable small right pleural effusion. Adjacent pleural thickening and mild subpleural nodularity in the right upper lobe, unchanged. 3. Stable airspace opacity in the lingula and left lower lobe along the margins of the empyema. 4. Coronary, aortic arch, and branch vessel atherosclerotic vascular disease. Moderate cardiomegaly. Small pericardial effusion, increased from prior. 5. Diffuse stranding in the mediastinum is nonspecific but unchanged. 6. Aortic atherosclerosis. Aortic Atherosclerosis (ICD10-I70.0). Electronically Signed   By: Van Clines M.D.   On: 03/25/2020 18:06   DG CHEST PORT 1 VIEW  Result Date: 03/26/2020 CLINICAL DATA:  Pleural effusion.  Empyema. EXAM: PORTABLE CHEST 1 VIEW COMPARISON:  CT 03/25/2020.  Chest x-ray 03/25/2020. FINDINGS: Left chest tube in stable position. Stable left base pleural fluid and air collection consistent with known empyema again noted. No interim change. Stable bibasilar atelectasis/infiltrates and small right pleural effusion again noted without interim change. Heart size stable. Degenerative change thoracic spine. Right upper extremity vascular stents noted. IMPRESSION: 1. Left chest tube in stable position. Stable left base pleural fluid and air collection consistent with known empyema again noted. No interim change. 2. Stable bibasilar atelectasis/infiltrates and  small right pleural effusion again noted without interim change. Electronically Signed   By: Marcello Moores  Register   On: 03/26/2020 07:08   DG CHEST PORT 1 VIEW  Result Date: 03/25/2020 CLINICAL DATA:  Follow-up pleural effusion EXAM: PORTABLE CHEST 1 VIEW COMPARISON:  03/24/2020 FINDINGS: Cardiac shadow is enlarged but stable. Pigtail catheter is again noted in the left base with basilar pneumothorax identified. Minimal pleural fluid is seen. Consolidation in the left base is noted laterally. Small right-sided pleural effusion is seen. Some nodularity is noted in the right base similar to that noted on prior CT examination. IMPRESSION: Persistent left basilar pneumothorax. The remainder of the exam is stable from the prior study. Electronically Signed   By: Inez Catalina M.D.   On: 03/25/2020 15:26   Medications: Infusions: . vancomycin Stopped (03/24/20 1225)    Scheduled Medications: . aspirin EC  81 mg Oral Daily  . calcium acetate  667 mg Oral TID WC  . Chlorhexidine Gluconate Cloth  6 each Topical  [id number]  Derrill Memo ON 03/31/2020] darbepoetin (ARANESP) injection - DIALYSIS  40 mcg Intravenous Q Mon-HD  . doxercalciferol  3 mcg Intravenous Q M,W,F-HD  . heparin  5,000 Units Subcutaneous Q8H  . insulin aspart  0-15 Units Subcutaneous TID WC  . insulin glargine  2 Units Subcutaneous Daily  . rosuvastatin  5 mg Oral Daily  . sodium chloride flush  10 mL Intracatheter Q8H    have reviewed scheduled and prn medications.  Physical Exam: General: NAD-  Heart: RRR Lungs: mostly clear-  CT in place Abdomen: soft, non tender Extremities: no edema Dialysis Access: left AVF    03/26/2020,8:32 AM  LOS: 5 days

## 2020-03-26 NOTE — Plan of Care (Signed)

## 2020-03-26 NOTE — Progress Notes (Signed)
   NAME:  Kyle [name], MRN:  [id number], DOB:  01/26/1956, LOS: 5 ADMISSION DATE:  03/21/2020, CONSULTATION DATE:  2/26 REFERRING MD:  Macon Large, CHIEF COMPLAINT:  Chest tightness, dyspnea   Brief History:  65 y/o male admitted with chest tightness, dyspnea found to have a moderate to large left pleural effusion of uncertain etiology. At baseline has ESRD. New HFrEF EF 25%  Past Medical History:  ESRD on HD DM2 HTN HLD BiV HF CAD Pulmonary hypertension   Significant Hospital Events:  2/25 admitted to IMTS. L Pleural effusion not amenable to thora with IR due to loculated appearance 2/26 CT chest confirms complex, loculated effusion. PCCM consult for possible chest tube, management of effusion   Consults:  IR PCCM  Procedures:  2/26 pigtail (planned)  Significant Diagnostic Tests:  2/26 CT chest w con> Moderate L pleural effusion with pleural thickening, similar in appearance to Ct in 2021. Trace R effusion. Bilateral consolidative opacities (suspected rounded atelectasis). Cardiomegaly, small pericardial effusion. 5m focal pulm nodule. Three vessel CAD.   Micro Data:  2/25 SARS Cov2> neg 2/27 left pleural effusion >   Antimicrobials:     Interim History / Subjective:   No overnight issues. Seen in dialysis today.    Objective   Blood pressure 125/69, pulse 79, temperature 97.9 F (36.6 C), temperature source Oral, resp. rate (!) 0, height 6' (1.829 m), weight 76.5 kg, SpO2 96 %.        Intake/Output Summary (Last 24 hours) at 03/26/2020 1338 Last data filed at 03/26/2020 0806 Gross per 24 hour  Intake 140 ml  Output 280 ml  Net -140 ml   Filed Weights   03/21/20 1412 03/24/20 0806 03/26/20 1105  Weight: 77.8 kg 75.6 kg 76.5 kg    Examination: General: awake, alert, well appearing PULM: decreased left breath sounds, chest tube in position. Which is tidaling, no airleak. On waterseal CV: RRR GI: Soft nontender  3/1 ct chest personally  reviewed There is trapped lung. Improved appearance of empyema with some residual fluid. Some bilateral pleural irregularities, likely atelectasis  Resolved Hospital Problem list     Assessment & Plan:  Empyema - s/p chest tube drainage - discussed with CTCS - the pocket of trapped lung is small and since the patient is relatively asymptomatic from a dyspnea perspective, unlikely to have any meaningful benefit. - maintain chest tube to suction overnight, will monitor drainage. Once he drops down closer to 200cc/24 hours, will pull tube   NLenice Llamas MD Pulmonary and CPorters NeckPager: see AMION Office:[phone]

## 2020-03-26 NOTE — Progress Notes (Signed)
Changed atrium on chest tube.

## 2020-03-26 NOTE — Progress Notes (Signed)
HD#5 Subjective:  Overnight Events: No acute events overnight  Kyle [name] states he is doing well this morning. Denies any pain around his chest tube insertion or leaking from insertion point. He states drainage from tube has become lighter in color. Denies shortness of breath. He notes that his wife has been visiting him daily. Discussed with him the plan for today would be dialysis and to continue to monitor his chest tube output. He has no acute complaints or concerns this morning.    Objective:  Vital signs in last 24 hours: Vitals:   03/25/20 0507 03/25/20 1358 03/25/20 2117 03/26/20 0601  BP: 130/66 129/68 136/71 (!) 141/83  Pulse: 90 87 96 92  Resp: '18 16 17 17  '$ Temp: 98.8 F (37.1 C)  98.4 F (36.9 C) 97.8 F (36.6 C)  TempSrc: Oral   Oral  SpO2: 99% 97% 94% 95%  Weight:      Height:        Physical Exam:  Constitutional: well-appearing,  in no acute distress HENT: normocephalic atraumatic Neck: supple Cardiovascular: regular rate and rhythm Pulmonary/Chest: normal work of breathing on room air MSK: normal bulk and tone Neurological: alert & oriented x 3 Skin: warm and dry. Left chest tube in place.  Psych: Normal thought process  Filed Weights   03/21/20 1321 03/21/20 1412 03/24/20 0806  Weight: 79.4 kg 77.8 kg 75.6 kg     Intake/Output Summary (Last 24 hours) at 03/26/2020 1113 Last data filed at 03/26/2020 0806 Gross per 24 hour  Intake 140 ml  Output 280 ml  Net -140 ml   Net IO Since Admission: -5,419.14 mL [03/26/20 1113]  Pertinent Labs: CBC Latest Ref Rng & Units 03/26/2020 03/25/2020 03/24/2020  WBC 4.0 - 10.5 K/uL 10.3 10.2 10.6(H)  Hemoglobin 13.0 - 17.0 g/dL 9.8(L) 10.5(L) 10.7(L)  Hematocrit 39.0 - 52.0 % 28.5(L) 31.5(L) 31.5(L)  Platelets 150 - 400 K/uL 428(H) 455(H) 474(H)    CMP Latest Ref Rng & Units 03/26/2020 03/25/2020 03/24/2020  Glucose 70 - 99 mg/dL 125(H) 174(H) 69(L)  BUN 8 - 23 mg/dL 40(H) 27(H) 44(H)  Creatinine 0.61 - 1.24 mg/dL  7.18(H) 5.88(H) 7.52(H)  Sodium 135 - 145 mmol/L 133(L) 134(L) 131(L)  Potassium 3.5 - 5.1 mmol/L 4.3 3.9 4.8  Chloride 98 - 111 mmol/L 96(L) 95(L) 95(L)  CO2 22 - 32 mmol/L '23 26 23  '$ Calcium 8.9 - 10.3 mg/dL 9.2 9.1 9.3  Total Protein 6.5 - 8.1 g/dL - - -  Total Bilirubin 0.3 - 1.2 mg/dL - - -  Alkaline Phos 38 - 126 U/L - - -  AST 15 - 41 U/L - - -  ALT 0 - 44 U/L - - -    Imaging: CT CHEST WO CONTRAST  Result Date: 03/25/2020 CLINICAL DATA:  Empyema.  Prior chest tube placement. EXAM: CT CHEST WITHOUT CONTRAST TECHNIQUE: Multidetector CT imaging of the chest was performed following the standard protocol without IV contrast. COMPARISON:  03/22/2020 FINDINGS: Cardiovascular: Bilateral common carotid atherosclerotic calcification. Coronary, aortic arch, and branch vessel atherosclerotic vascular disease. Moderate cardiomegaly. Small pericardial effusion, increased from prior. Right axillary stent noted. Mediastinum/Nodes: Diffuse stranding in the mediastinum is nonspecific but unchanged. Stable small thyroid nodules, less than 1.5 cm in diameter. Not clinically significant; no follow-up imaging recommended (ref: J Am Coll Radiol. 2015 Feb;12(2): 143-50). Lungs/Pleura: Small right pleural effusion is essentially stable. There continues to be mild subpleural nodularity posteriorly along the right upper lobe margin of the pleural thickening.  There is left pleural thickening along with a left chest tube in place within a collection of gas and frothy fluid in the left pleural space favoring empyema. Previously this was fluid-filled and with enhancing margins, and the volume has substantially decreased compared to 03/22/2020. There continues to be some airspace opacity in the lingula and left lower lobe along the margins of the empyema. Upper Abdomen: Unremarkable Musculoskeletal: Lower cervical and mild midthoracic spondylosis. IMPRESSION: 1. Significant reduction in size of the left empyema, with a left  pleural drainage catheter in place. There remains some frothy fluid along with a moderate amount of gas within the pleural space. 2. Stable small right pleural effusion. Adjacent pleural thickening and mild subpleural nodularity in the right upper lobe, unchanged. 3. Stable airspace opacity in the lingula and left lower lobe along the margins of the empyema. 4. Coronary, aortic arch, and branch vessel atherosclerotic vascular disease. Moderate cardiomegaly. Small pericardial effusion, increased from prior. 5. Diffuse stranding in the mediastinum is nonspecific but unchanged. 6. Aortic atherosclerosis. Aortic Atherosclerosis (ICD10-I70.0). Electronically Signed   By: Van Clines M.D.   On: 03/25/2020 18:06   DG CHEST PORT 1 VIEW  Result Date: 03/26/2020 CLINICAL DATA:  Pleural effusion.  Empyema. EXAM: PORTABLE CHEST 1 VIEW COMPARISON:  CT 03/25/2020.  Chest x-ray 03/25/2020. FINDINGS: Left chest tube in stable position. Stable left base pleural fluid and air collection consistent with known empyema again noted. No interim change. Stable bibasilar atelectasis/infiltrates and small right pleural effusion again noted without interim change. Heart size stable. Degenerative change thoracic spine. Right upper extremity vascular stents noted. IMPRESSION: 1. Left chest tube in stable position. Stable left base pleural fluid and air collection consistent with known empyema again noted. No interim change. 2. Stable bibasilar atelectasis/infiltrates and small right pleural effusion again noted without interim change. Electronically Signed   By: Marcello Moores  Register   On: 03/26/2020 07:08   DG CHEST PORT 1 VIEW  Result Date: 03/25/2020 CLINICAL DATA:  Follow-up pleural effusion EXAM: PORTABLE CHEST 1 VIEW COMPARISON:  03/24/2020 FINDINGS: Cardiac shadow is enlarged but stable. Pigtail catheter is again noted in the left base with basilar pneumothorax identified. Minimal pleural fluid is seen. Consolidation in the left  base is noted laterally. Small right-sided pleural effusion is seen. Some nodularity is noted in the right base similar to that noted on prior CT examination. IMPRESSION: Persistent left basilar pneumothorax. The remainder of the exam is stable from the prior study. Electronically Signed   By: Inez Catalina M.D.   On: 03/25/2020 15:26    Assessment/Plan:   Principal Problem:   Pleural effusion on left Active Problems:   Pleural effusion   DM (diabetes mellitus), type 2, uncontrolled, with renal complications (HCC)   Acute hypoxemic respiratory failure (HCC)   ESRD (end stage renal disease) on dialysis ([place])   Biventricular heart failure, NYHA class 3 ([place])   Patient Summary: Mr. Remmy Beerman is 65yo person with ESRD on MWF HD, chronic systolic and diastolic heart failure, type II diabetes mellitus, secondary hyperparathyroidism, iron deficiency anemia, hypertension admitted 2/25 with loculated left pleural effusion, concerning for possible infectious etiology  #Empyema s/p chest tube placement 2/26 #Basilar Pneumothorax Overnight no acute events, remains afebrile and hemodynamically stable. Chest tube in place, -220 out last 24 hrs. Repeat CT Lungs 3/1 decreased size of left empyema. Continue to monitor pneumothorax, if no improvement after drainage from chest tube slows, may need VATS procedure. Morning chest x-ray done, chest tube in position,  stable left sided empyema.  PCCM following, appreciate their recommendations - C/w vancomycin with dialysis for 4 weeks (day 4). - continue to track chest tube drainage - culture w/ gram stain, + few Staph aureus, repeating susceptibilities - fungal culture negative  #ESRD on MWF Appears euvolemic on exam today. Electrolytes within normal limits. Appreciate nephrology assistance.  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 Patient reportedly had NM stress test afterwards but unable to  locate in chart. Likely will need further ischemic eval/catheterization in outpatient setting, especially since he has plans to undergo kidney transplantation in the future. - F/u outpatient cardiology for ischemic evaluation - strict I/O  #Type II diabetes mellitus A1c 9.7 on admission. Continuing with SSI-moderate and lantus 2U - Holding home glimepiride - would recommend d/c on discharge given his ESRD (contraindicated). Could consider changing to low dose glipizide. But would recommend GLP-1 agonist +/- low dose insulin. Will need optimization prior to transplant surgery.  - SSI-moderate, 2 U lantus nightly  #Chronic normocytic anemia Based on iron studies this admission, most likely anemia due to chronic disease. No obvious signs of bleeding. Will continue to monitor. - Daily CBC  DIET: HH IVF: n/a DVT PPX: Heparin BOWEL: n/a CODE: FULL FAM COM: n/a  Dispo: Anticipated discharge to Home in 3-4 days pending further work up of his empyema  [facility] Resident PGY-1 Pager [phone] Please contact the on call pager after 5 pm and on weekends at [phone].

## 2020-03-27 ENCOUNTER — Telehealth: Payer: Self-pay | Admitting: Internal Medicine

## 2020-03-27 DIAGNOSIS — Z992 Dependence on renal dialysis: Secondary | ICD-10-CM

## 2020-03-27 LAB — RENAL FUNCTION PANEL
Albumin: 1.8 g/dL — ABNORMAL LOW (ref 3.5–5.0)
Anion gap: 12 (ref 5–15)
BUN: 28 mg/dL — ABNORMAL HIGH (ref 8–23)
CO2: 24 mmol/L (ref 22–32)
Calcium: 8.6 mg/dL — ABNORMAL LOW (ref 8.9–10.3)
Chloride: 95 mmol/L — ABNORMAL LOW (ref 98–111)
Creatinine, Ser: 5.5 mg/dL — ABNORMAL HIGH (ref 0.61–1.24)
GFR, Estimated: 11 mL/min — ABNORMAL LOW (ref >60)
Glucose, Bld: 150 mg/dL — ABNORMAL HIGH (ref 70–99)
Phosphorus: 3.5 mg/dL (ref 2.5–4.6)
Potassium: 4 mmol/L (ref 3.5–5.1)
Sodium: 131 mmol/L — ABNORMAL LOW (ref 135–145)

## 2020-03-27 LAB — GLUCOSE, CAPILLARY
Glucose-Capillary: 153 mg/dL — ABNORMAL HIGH (ref 70–99)
Glucose-Capillary: 132 mg/dL — ABNORMAL HIGH (ref 70–99)

## 2020-03-27 LAB — CBC
HCT: 30.1 % — ABNORMAL LOW (ref 39.0–52.0)
Hemoglobin: 10.3 g/dL — ABNORMAL LOW (ref 13.0–17.0)
MCH: 32.1 pg (ref 26.0–34.0)
MCHC: 34.2 g/dL (ref 30.0–36.0)
MCV: 93.8 fL (ref 80.0–100.0)
Platelets: 403 10*3/uL — ABNORMAL HIGH (ref 150–400)
RBC: 3.21 MIL/uL — ABNORMAL LOW (ref 4.22–5.81)
RDW: 15.5 % (ref 11.5–15.5)
WBC: 9.7 10*3/uL (ref 4.0–10.5)
nRBC: 0 % (ref 0.0–0.2)

## 2020-03-27 MED ORDER — DOXERCALCIFEROL 4 MCG/2ML IV SOLN: 3.0000 ug | INTRAVENOUS | Status: AC

## 2020-03-27 MED ORDER — ACETAMINOPHEN 325 MG PO TABS: 650.0000 mg | ORAL_TABLET | Freq: Four times a day (QID) | ORAL | 0 refills | Status: AC | PRN

## 2020-03-27 MED ORDER — GLIPIZIDE 5 MG PO TABS: 2.5000 mg | ORAL_TABLET | Freq: Every day | ORAL | 0 refills | Status: AC

## 2020-03-27 MED ORDER — HEPARIN SODIUM (PORCINE) 1000 UNIT/ML DIALYSIS: 3000.0000 [IU] | INTRAMUSCULAR | Status: DC | PRN

## 2020-03-27 MED ORDER — DARBEPOETIN ALFA 40 MCG/0.4ML IJ SOSY: 40.0000 ug | PREFILLED_SYRINGE | INTRAMUSCULAR | Status: AC

## 2020-03-27 MED ORDER — HEPARIN SODIUM (PORCINE) 1000 UNIT/ML DIALYSIS: 20.0000 [IU]/kg | INTRAMUSCULAR | Status: AC | PRN

## 2020-03-27 MED ORDER — DOXYCYCLINE HYCLATE 100 MG PO CAPS: 100.0000 mg | ORAL_CAPSULE | Freq: Two times a day (BID) | ORAL | 0 refills | Status: AC

## 2020-03-27 MED ORDER — HEPARIN SODIUM (PORCINE) 5000 UNIT/ML IJ SOLN: 5000.0000 [IU] | Freq: Three times a day (TID) | INTRAMUSCULAR | Status: DC

## 2020-03-27 MED ORDER — OXYCODONE HCL 5 MG PO CAPS: 5.0000 mg | ORAL_CAPSULE | Freq: Three times a day (TID) | ORAL | 0 refills | Status: AC

## 2020-03-27 NOTE — Discharge Summary (Signed)
Name: Kyle [name] MRN: [id number] DOB: 1955-10-21 65 y.o. PCP: Patient, No Pcp Per  Date of Admission: 03/21/2020  7:30 AM Date of Discharge:  Attending Physician: Dr. Daryll Drown  Discharge Diagnosis: Principal Problem:   Pleural effusion on left Active Problems:   Pleural effusion   DM (diabetes mellitus), type 2, uncontrolled, with renal complications ([place])   Acute hypoxemic respiratory failure (HCC)   ESRD (end stage renal disease) on dialysis (HCC)   Biventricular heart failure, NYHA class 3 ([place])    Discharge Medications: Allergies as of 03/27/2020   No Known Allergies     Medication List    STOP taking these medications   glimepiride 2 MG tablet Commonly known as: Amaryl     TAKE these medications   acetaminophen 325 MG tablet Commonly known as: TYLENOL Take 2 tablets (650 mg total) by mouth every 6 (six) hours as needed for mild pain (or Fever >/= 101).   aspirin 81 MG tablet Take 81 mg by mouth daily.   calcium acetate 667 MG capsule Commonly known as: PHOSLO Take 667 mg by mouth 3 (three) times daily with meals.   carvedilol 12.5 MG tablet Commonly known as: COREG Take 1 tablet (12.5 mg total) by mouth 2 (two) times daily with a meal.   Darbepoetin Alfa 40 MCG/0.4ML Sosy injection Commonly known as: ARANESP Inject 0.4 mLs (40 mcg total) into the vein every Monday with hemodialysis. Start taking on: March 31, 2020   doxercalciferol 4 MCG/2ML injection Commonly known as: HECTOROL Inject 1.5 mLs (3 mcg total) into the vein every Monday, Wednesday, and Friday with hemodialysis. Start taking on: March 28, 2020   doxycycline 100 MG capsule Commonly known as: VIBRAMYCIN Take 1 capsule (100 mg total) by mouth 2 (two) times daily for 21 days.   glipiZIDE 5 MG tablet Commonly known as: Glucotrol Take 0.5 tablets (2.5 mg total) by mouth daily before breakfast.   heparin 1000 unit/mL Soln injection 1.5 mLs (1,500 Units total) by Dialysis route as needed  (in dialysis).   heparin 1000 unit/mL Soln injection 3 mLs (3,000 Units total) by Dialysis route as needed (in dialysis as needed).   heparin 1000 unit/mL Soln injection 3 mLs (3,000 Units total) by Dialysis route as needed (in dialysis as needed).   heparin 5000 UNIT/ML injection Inject 1 mL (5,000 Units total) into the skin every 8 (eight) hours.   lisinopril 20 MG tablet Commonly known as: ZESTRIL TAKE 1 TABLET BY MOUTH DAILY   nitroGLYCERIN 0.4 MG SL tablet Commonly known as: NITROSTAT Place 1 tablet (0.4 mg total) under the tongue every 5 (five) minutes as needed. What changed: reasons to take this   oxycodone 5 MG capsule Commonly known as: OXY-IR Take 1 capsule (5 mg total) by mouth every 8 (eight) hours for 3 days.   Rena-Vite Rx 1 MG Tabs Take 1 tablet by mouth daily.   rosuvastatin 5 MG tablet Commonly known as: CRESTOR Take 5 mg by mouth daily.       Disposition and follow-up:   Mr.Kyle [name] was discharged from [facility] in Stable condition.  At the hospital follow up visit please address:  1.  Follow-up:  A. Empyema - F/U with pulmonology/resolution post abx    B. Pneumothorax - F/U with pulmonology   C. Diabetes Mellitis - Change in therapy, monitor for hypoglycemia   D. ESRD - Continue to follow with Nephrologist  2.  Labs / imaging needed at time of  follow-up: chest imaging, repeat A1c in 3 months  3.  Pending labs/ test needing follow-up: none  4.  Medication Changes  Started: Glipizide  Abx - Doxycycline End Date: 04/17/20  Stopped: Glimepiride    Follow-up Appointments:  Follow-up Information    [facility]. Schedule an appointment as soon as possible for a visit.   Specialty: Pulmonology Why: Please call and make an appointment for a post hospital follow up for your empyema, pneumothorax, and chest tube removal.  Contact information: [address]  SSN-[id number] [phone]              Hospital Course by problem list:  51 yom with ESRD on HD MWF, ICM with combined heart failure, CAD, and type 2 diabetes mellitus who presented to [facility] ED for dizziness during dialysis.  Acute hypoxic respiratory failure secondary complex loculated left empyema CXR on admission revealed a large left pleural effusion. IR was initially consulted for drainage however they found loculations and thick fluid on ultrasound prior to the procedure.  Chest CT was obtained on hospital day #1 which the left pleural effusion with adjacent pleural thickening as well as enhancing consolidative opacities of the lungs bilaterally. Pulmonology was consulted and he underwent a chest tube placement with drainage of purulent appearing fluid. Fluid analysis revealed a WBC count of ~13k and gram stain was negative. LDH from the pleural fluid was significantly elevated to ~7k. Also found to have neutrophilic predominance concerning for infectious vs malignant etiology. Cytology without evidence of malignant cells. Cultures grew few stauph aureus, negative fungal cultures. Patient was started on vancomycin. Cultures and sensitivities revealed MSSA, discussed with ID who agreed with doxycycline 100 mg BID for 3 weeks. Chest tube was pulled on 03/27/20, day of discharge. Thought to have residual pneumothorax ex vacuo that pulmonology do not believe will cause him long term respiratory discomfort. Patient to follow up with pulmonology, given three days worth of oxycodone '5mg'$  for severe pain. Patient was hemodynamically stable upon discharge and progressed well during his hospitalization.   ESRD  Patient remained on regular dialysis schedule while hospitalized and was followed by his primary nephrologist, Dr. Moshe Cipro. Will follow with her at discharge, patient states she acts as his PCP.   Type II Diabetes Mellitus A1c of 9.7 upon admission, he was started on SSI and lantus.  He had a few episodes of hypoglycemia so his lantus was lowered from 5 to 2 U. On discharge he was switched from glimepiride to glipizide. Instructed to follow up with PCP for repeat A1c in 3 months and to stop taking medicines if notices hypoglycemic episodes.   Discharge Subjective:  Dr. Forrestine Him states that his chest tube was removed and he is feeling much more comfortable. He spoke with the surgeon earlier today and discussed possible surgery. He and surgeon agreed that he would hold off on any procedures currently and will be scheduling follow up with them outpatient for further assessment. He has had significant improvement in appetite without any complaints currently.  Discharge Exam:   BP (!) 144/81 (BP Location: Right Arm) Comment: not. nurse  Pulse 97   Temp 97.7 F (36.5 C) (Oral)   Resp 20   Ht 6' (1.829 m)   Wt 72.9 kg   SpO2 95%   BMI 21.80 kg/m  Constitutional: well-appearing,  in no acute distress HENT: normocephalic atraumatic Neck: supple Cardiovascular: regular rate and rhythm Pulmonary/Chest: normal work of breathing on room air.  Decreased breath sound left lower lobe MSK: normal bulk and tone Neurological: alert & oriented x 3 Skin: warm and dry.  Psych: Normal thought process  Pertinent Labs, Studies, and Procedures:  CBC Latest Ref Rng & Units 03/27/2020 03/26/2020 03/25/2020  WBC 4.0 - 10.5 K/uL 9.7 10.3 10.2  Hemoglobin 13.0 - 17.0 g/dL 10.3(L) 9.8(L) 10.5(L)  Hematocrit 39.0 - 52.0 % 30.1(L) 28.5(L) 31.5(L)  Platelets 150 - 400 K/uL 403(H) 428(H) 455(H)    CMP Latest Ref Rng & Units 03/27/2020 03/26/2020 03/25/2020  Glucose 70 - 99 mg/dL 150(H) 125(H) 174(H)  BUN 8 - 23 mg/dL 28(H) 40(H) 27(H)  Creatinine 0.61 - 1.24 mg/dL 5.50(H) 7.18(H) 5.88(H)  Sodium 135 - 145 mmol/L 131(L) 133(L) 134(L)  Potassium 3.5 - 5.1 mmol/L 4.0 4.3 3.9  Chloride 98 - 111 mmol/L 95(L) 96(L) 95(L)  CO2 22 - 32 mmol/L '24 23 26  '$ Calcium 8.9 - 10.3 mg/dL 8.6(L) 9.2 9.1  Total Protein  6.5 - 8.1 g/dL - - -  Total Bilirubin 0.3 - 1.2 mg/dL - - -  Alkaline Phos 38 - 126 U/L - - -  AST 15 - 41 U/L - - -  ALT 0 - 44 U/L - - -    DG Chest 2 View  Result Date: 03/21/2020 CLINICAL DATA:  Near syncope this morning while having dialysis. EXAM: CHEST - 2 VIEW COMPARISON:  09/08/2015 FINDINGS: Patient has vascular stent overlying the RIGHT axilla. There are bilateral pleural effusions. Significant LEFT basilar and LEFT mid lung zone opacity is consistent with pleural effusion and atelectasis or consolidation. There is no pneumothorax. There is mild interstitial edema. No overt alveolar edema. IMPRESSION: Interval development of bilateral pleural effusions with significant LEFT lung opacity indicating pleural effusion and atelectasis or consolidation. Mild interstitial edema. Electronically Signed   By: Nolon Nations M.D.   On: 03/21/2020 08:34   CT CHEST W CONTRAST  Result Date: 03/22/2020 CLINICAL DATA:  Pleural effusion EXAM: CT CHEST WITH CONTRAST TECHNIQUE: Multidetector CT imaging of the chest was performed during intravenous contrast administration. CONTRAST:  43m OMNIPAQUE IOHEXOL 300 MG/ML  SOLN COMPARISON:  November 15, 2019 FINDINGS: Cardiovascular: Cardiomegaly. Three-vessel severe coronary artery atherosclerotic calcifications. Small pericardial effusion. Atherosclerotic calcifications of the aorta. Mediastinum/Nodes: Multiple bilateral thyroid nodules, several which are coarsely calcified, similar in comparison to prior. No new axillary or mediastinal adenopathy. Lungs/Pleura: There is a moderate LEFT pleural effusion with adjacent pleural thickening and smooth enhancement, similar in comparison to prior CT from 2021. There is a trace RIGHT pleural effusion. There are multiple enhancing consolidative opacities along the periphery of the LEFT lung, particularly within the LEFT lower lobe and lingula, these are stable in comparison to prior and likely reflect areas of rounded  atelectasis. Similar appearance of peripheral enhancing opacities of the RIGHT lower lobe and RIGHT middle lobe, likely atelectasis/scar. Unchanged peripheral ground-glass opacities of the RIGHT lung base. No new focal consolidation. Unchanged biapical pleuroparenchymal scarring. More focal nodular opacity versus scar measures 6 mm, unchanged (series 5, image 43). Upper Abdomen: No acute abnormality. Musculoskeletal: No acute osseous abnormality. Diffusely sclerotic appearance of the bones most consistent with underlying renal osteodystrophy. IMPRESSION: 1. Moderate LEFT pleural effusion with adjacent enhancing pleural thickening, similar in comparison to prior CT from 2021. This could reflect a chronic loculated pleural effusion as pleura is smoothly enhancing without definitive nodularity. Consider correlation with fluid sampling to exclude any underlying malignant etiology. 2. Trace RIGHT pleural effusion. 3. Multiple enhancing consolidative opacities along the  periphery of the bilateral lungs, particularly within the LEFT lower lobe and lingula. These are stable in comparison to prior and likely reflect areas of rounded atelectasis/scar. 4. Cardiomegaly with small pericardial effusion. 5. There is a more focal 6 mm pulmonary nodule versus scarring which is unchanged in comparison to prior CT. Consider follow-up chest CT in 1 year. Aortic Atherosclerosis (ICD10-I70.0). Electronically Signed   By: Valentino Saxon MD   On: 03/22/2020 12:43   DG CHEST PORT 1 VIEW  Result Date: 03/22/2020 CLINICAL DATA:  Pleural effusion. EXAM: PORTABLE CHEST 1 VIEW COMPARISON:  March 21, 2020. FINDINGS: Stable cardiomediastinal silhouette. No pneumothorax is noted. Interval placement of pigtail drainage catheter into left pleural effusion. Stable mild right pleural effusion is noted. Bony thorax is unremarkable. IMPRESSION: Interval placement of pigtail drainage catheter into left pleural effusion. Stable mild right  pleural effusion. No pneumothorax is noted. Electronically Signed   By: Marijo Conception M.D.   On: 03/22/2020 17:44   IR US CHEST  Result Date: 03/21/2020 CLINICAL DATA:  65 year old with history of syncope. Chest radiograph demonstrates opacity in the left chest suggestive for a pleural effusion. Patient was referred for ultrasound-guided thoracentesis. EXAM: CHEST ULTRASOUND COMPARISON:  Chest radiograph 03/21/2020 FINDINGS: Very complex material in the left posterior chest. Very small pockets of fluid are present. Difficult to differentiate loculated pleural fluid from consolidated lung. IMPRESSION: Complex or loculated material in the left chest. Findings may represent a very loculated pleural effusion but difficult to differentiate from consolidated lung. Consider further characterization with a chest CT with IV contrast. Thoracentesis was not performed. Electronically Signed   By: Markus Daft M.D.   On: 03/21/2020 17:34       Signed: Riesa Pope, MD 03/27/2020, 2:47 PM   Pager: [phone]

## 2020-03-27 NOTE — Telephone Encounter (Signed)
Needs appointment for hospital follow up

## 2020-03-27 NOTE — Progress Notes (Signed)
Subjective:   chest tube appears to still be putting out, but much less - feeling better- no other issues -   HD yest- removed 3000 tolerated well.  Pulm MD here at bedside, is going to pull the chest tube    Objective Vital signs in last 24 hours: Vitals:   03/26/20 1420 03/26/20 1644 03/26/20 2035 03/27/20 0526  BP: (!) 144/79 131/63 (!) 122/54 (!) 144/81  Pulse:  84 86 97  Resp: '16 20 20 20  '$ Temp: 97.7 F (36.5 C) 98.6 F (37 C) 97.9 F (36.6 C) 97.7 F (36.5 C)  TempSrc: Oral  Oral Oral  SpO2: 96% 93% 94% 95%  Weight: 72.9 kg     Height:       Weight change:   Intake/Output Summary (Last 24 hours) at 03/27/2020 0859 Last data filed at 03/27/2020 [id number] Gross per 24 hour  Intake 160 ml  Output 3252 ml  Net -3092 ml    OP HD:AF MWF 4h 450/500 75.5kg 2/2.25 bath LUE AVF Hep 7000 -mircera 30 q4wk, last 2/7 - hect 3ug tiw  Assessment/ Plan: Pt is a 65 y.o. yo male with ESRD, cardiomyopathy who was admitted on 03/21/2020 with pleural effusion/empyema requiring chest tube  Assessment/Plan: 1. Pleural effusion Ilda Basset-  S/p chest tube-  Feeling better-  Pulm on board-  Culture growth not complete - on vanc- will need total of 4 weeks, can give with HD.  Since CT being pulled today may be able to be discharged 2. ESRD - continuing on home MWF schedule-  Next due tomorrow via AVF-  If goes home can be at OP center 3. Anemia- hgb close to goal-  On low dose ESA q Monday 4. Secondary hyperparathyroidism- cont hectorol and phoslo - numbers at goal  5. HTN/volume-  Had been trying to keep as dry as possible as OP-  Under EDW- will need adjust as OP   Louis Meckel    Labs: Basic Metabolic Panel: Recent Labs  Lab 03/25/20 0242 03/26/20 0109 03/27/20 0216  NA 134* 133* 131*  K 3.9 4.3 4.0  CL 95* 96* 95*  CO2 '26 23 24  '$ GLUCOSE 174* 125* 150*  BUN 27* 40* 28*  CREATININE 5.88* 7.18* 5.50*  CALCIUM 9.1 9.2 8.6*  PHOS 3.6 3.5 3.5   Liver Function  Tests: Recent Labs  Lab 03/21/20 1213 03/22/20 0222 03/24/20 0348 03/25/20 0242 03/26/20 0109 03/27/20 0216  AST 27 22  --   --   --   --   ALT 25 23  --   --   --   --   ALKPHOS 137* 131*  --   --   --   --   BILITOT 0.9 0.9  --   --   --   --   PROT 6.8 6.7  --   --   --   --   ALBUMIN 2.1* 2.0*   < > 1.9* 1.9* 1.8*   < > = values in this interval not displayed.   No results for input(s): LIPASE, AMYLASE in the last 168 hours. No results for input(s): AMMONIA in the last 168 hours. CBC: Recent Labs  Lab 03/21/20 0841 03/22/20 0222 03/22/20 1757 03/23/20 0755 03/24/20 0348 03/25/20 0242 03/26/20 0109 03/27/20 0216  WBC 10.3 8.9 9.7 8.5 10.6* 10.2 10.3 9.7  NEUTROABS 9.0* 7.7 8.4*  --   --   --   --   --   HGB 9.1* 8.8* 9.9* 9.6* 10.7*  10.5* 9.8* 10.3*  HCT 26.7* 25.3* 28.5* 28.1* 31.5* 31.5* 28.5* 30.1*  MCV 95.4 93.7 94.4 93.7 94.0 94.6 92.8 93.8  PLT 433* 412* 548* 406* 474* 455* 428* 403*   Cardiac Enzymes: No results for input(s): CKTOTAL, CKMB, CKMBINDEX, TROPONINI in the last 168 hours. CBG: Recent Labs  Lab 03/26/20 0800 03/26/20 1537 03/26/20 1646 03/26/20 2038 03/27/20 0757  GLUCAP 111* 63* 149* 205* 153*    Iron Studies:  No results for input(s): IRON, TIBC, TRANSFERRIN, FERRITIN in the last 72 hours. Studies/Results: CT CHEST WO CONTRAST  Result Date: 03/25/2020 CLINICAL DATA:  Empyema.  Prior chest tube placement. EXAM: CT CHEST WITHOUT CONTRAST TECHNIQUE: Multidetector CT imaging of the chest was performed following the standard protocol without IV contrast. COMPARISON:  03/22/2020 FINDINGS: Cardiovascular: Bilateral common carotid atherosclerotic calcification. Coronary, aortic arch, and branch vessel atherosclerotic vascular disease. Moderate cardiomegaly. Small pericardial effusion, increased from prior. Right axillary stent noted. Mediastinum/Nodes: Diffuse stranding in the mediastinum is nonspecific but unchanged. Stable small thyroid nodules,  less than 1.5 cm in diameter. Not clinically significant; no follow-up imaging recommended (ref: J Am Coll Radiol. 2015 Feb;12(2): 143-50). Lungs/Pleura: Small right pleural effusion is essentially stable. There continues to be mild subpleural nodularity posteriorly along the right upper lobe margin of the pleural thickening. There is left pleural thickening along with a left chest tube in place within a collection of gas and frothy fluid in the left pleural space favoring empyema. Previously this was fluid-filled and with enhancing margins, and the volume has substantially decreased compared to 03/22/2020. There continues to be some airspace opacity in the lingula and left lower lobe along the margins of the empyema. Upper Abdomen: Unremarkable Musculoskeletal: Lower cervical and mild midthoracic spondylosis. IMPRESSION: 1. Significant reduction in size of the left empyema, with a left pleural drainage catheter in place. There remains some frothy fluid along with a moderate amount of gas within the pleural space. 2. Stable small right pleural effusion. Adjacent pleural thickening and mild subpleural nodularity in the right upper lobe, unchanged. 3. Stable airspace opacity in the lingula and left lower lobe along the margins of the empyema. 4. Coronary, aortic arch, and branch vessel atherosclerotic vascular disease. Moderate cardiomegaly. Small pericardial effusion, increased from prior. 5. Diffuse stranding in the mediastinum is nonspecific but unchanged. 6. Aortic atherosclerosis. Aortic Atherosclerosis (ICD10-I70.0). Electronically Signed   By: Van Clines M.D.   On: 03/25/2020 18:06   DG CHEST PORT 1 VIEW  Result Date: 03/26/2020 CLINICAL DATA:  Pleural effusion.  Empyema. EXAM: PORTABLE CHEST 1 VIEW COMPARISON:  CT 03/25/2020.  Chest x-ray 03/25/2020. FINDINGS: Left chest tube in stable position. Stable left base pleural fluid and air collection consistent with known empyema again noted. No interim  change. Stable bibasilar atelectasis/infiltrates and small right pleural effusion again noted without interim change. Heart size stable. Degenerative change thoracic spine. Right upper extremity vascular stents noted. IMPRESSION: 1. Left chest tube in stable position. Stable left base pleural fluid and air collection consistent with known empyema again noted. No interim change. 2. Stable bibasilar atelectasis/infiltrates and small right pleural effusion again noted without interim change. Electronically Signed   By: Marcello Moores  Register   On: 03/26/2020 07:08   DG CHEST PORT 1 VIEW  Result Date: 03/25/2020 CLINICAL DATA:  Follow-up pleural effusion EXAM: PORTABLE CHEST 1 VIEW COMPARISON:  03/24/2020 FINDINGS: Cardiac shadow is enlarged but stable. Pigtail catheter is again noted in the left base with basilar pneumothorax identified. Minimal pleural fluid is seen.  Consolidation in the left base is noted laterally. Small right-sided pleural effusion is seen. Some nodularity is noted in the right base similar to that noted on prior CT examination. IMPRESSION: Persistent left basilar pneumothorax. The remainder of the exam is stable from the prior study. Electronically Signed   By: Inez Catalina M.D.   On: 03/25/2020 15:26   Medications: Infusions: . vancomycin 750 mg (03/26/20 1559)    Scheduled Medications: . aspirin EC  81 mg Oral Daily  . calcium acetate  667 mg Oral TID WC  . Chlorhexidine Gluconate Cloth  6 each Topical Q0600  . Chlorhexidine Gluconate Cloth  6 each Topical Q0600  . [START ON 03/31/2020] darbepoetin (ARANESP) injection - DIALYSIS  40 mcg Intravenous Q Mon-HD  . doxercalciferol  3 mcg Intravenous Q M,W,F-HD  . heparin  5,000 Units Subcutaneous Q8H  . insulin aspart  0-15 Units Subcutaneous TID WC  . insulin glargine  2 Units Subcutaneous Daily  . rosuvastatin  5 mg Oral Daily  . sodium chloride flush  10 mL Intracatheter Q8H    have reviewed scheduled and prn  medications.  Physical Exam: General: NAD-  Heart: RRR Lungs: mostly clear-  CT in place Abdomen: soft, non tender Extremities: no edema Dialysis Access: left AVF    03/27/2020,8:59 AM  LOS: 6 days

## 2020-03-27 NOTE — Progress Notes (Incomplete)
HD#6 Subjective:  Overnight Events:   Kyle [name] states that his chest tube was removed and he is feeling much more comfortable. He spoke with the surgeon earlier today and discussed possible surgery. He and surgeon agreed that he would hold off on any procedures currently and will be scheduling follow up with them outpatient for further assessment. He has had significant improvement in appetite without any complaints currently.   Objective:  Vital signs in last 24 hours: Vitals:   03/26/20 1420 03/26/20 1644 03/26/20 2035 03/27/20 0526  BP: (!) 144/79 131/63 (!) 122/54 (!) 144/81  Pulse:  84 86 97  Resp: '16 20 20 20  '$ Temp: 97.7 F (36.5 C) 98.6 F (37 C) 97.9 F (36.6 C) 97.7 F (36.5 C)  TempSrc: Oral  Oral Oral  SpO2: 96% 93% 94% 95%  Weight: 72.9 kg     Height:        Physical Exam:  Constitutional: well-appearing,  in no acute distress HENT: normocephalic atraumatic Neck: supple Cardiovascular: regular rate and rhythm Pulmonary/Chest: normal work of breathing on room air MSK: normal bulk and tone Neurological: alert & oriented x 3 Skin: warm and dry. Left chest tube in place.  Psych: Normal thought process  Filed Weights   03/24/20 0806 03/26/20 1105 03/26/20 1420  Weight: 75.6 kg 76.5 kg 72.9 kg     Intake/Output Summary (Last 24 hours) at 03/27/2020 0557 Last data filed at 03/26/2020 2037 Gross per 24 hour  Intake 170 ml  Output 3182 ml  Net -3012 ml   Net IO Since Admission: -8,391.14 mL [03/27/20 0557]  Pertinent Labs: CBC Latest Ref Rng & Units 03/27/2020 03/26/2020 03/25/2020  WBC 4.0 - 10.5 K/uL 9.7 10.3 10.2  Hemoglobin 13.0 - 17.0 g/dL 10.3(L) 9.8(L) 10.5(L)  Hematocrit 39.0 - 52.0 % 30.1(L) 28.5(L) 31.5(L)  Platelets 150 - 400 K/uL 403(H) 428(H) 455(H)    CMP Latest Ref Rng & Units 03/27/2020 03/26/2020 03/25/2020  Glucose 70 - 99 mg/dL 150(H) 125(H) 174(H)  BUN 8 - 23 mg/dL 28(H) 40(H) 27(H)  Creatinine 0.61 - 1.24 mg/dL 5.50(H) 7.18(H) 5.88(H)   Sodium 135 - 145 mmol/L 131(L) 133(L) 134(L)  Potassium 3.5 - 5.1 mmol/L 4.0 4.3 3.9  Chloride 98 - 111 mmol/L 95(L) 96(L) 95(L)  CO2 22 - 32 mmol/L '24 23 26  '$ Calcium 8.9 - 10.3 mg/dL 8.6(L) 9.2 9.1  Total Protein 6.5 - 8.1 g/dL - - -  Total Bilirubin 0.3 - 1.2 mg/dL - - -  Alkaline Phos 38 - 126 U/L - - -  AST 15 - 41 U/L - - -  ALT 0 - 44 U/L - - -    Imaging: DG CHEST PORT 1 VIEW  Result Date: 03/26/2020 CLINICAL DATA:  Pleural effusion.  Empyema. EXAM: PORTABLE CHEST 1 VIEW COMPARISON:  CT 03/25/2020.  Chest x-ray 03/25/2020. FINDINGS: Left chest tube in stable position. Stable left base pleural fluid and air collection consistent with known empyema again noted. No interim change. Stable bibasilar atelectasis/infiltrates and small right pleural effusion again noted without interim change. Heart size stable. Degenerative change thoracic spine. Right upper extremity vascular stents noted. IMPRESSION: 1. Left chest tube in stable position. Stable left base pleural fluid and air collection consistent with known empyema again noted. No interim change. 2. Stable bibasilar atelectasis/infiltrates and small right pleural effusion again noted without interim change. Electronically Signed   By: Marcello Moores  Register   On: 03/26/2020 07:08    Assessment/Plan:   Principal Problem:  Pleural effusion on left Active Problems:   Pleural effusion   DM (diabetes mellitus), type 2, uncontrolled, with renal complications (HCC)   Acute hypoxemic respiratory failure (HCC)   ESRD (end stage renal disease) on dialysis ([place])   Biventricular heart failure, NYHA class 3 ([place])   Patient Summary: Kyle [name] is 65yo person with ESRD on MWF HD, chronic systolic and diastolic heart failure, type II diabetes mellitus, secondary hyperparathyroidism, iron deficiency anemia, hypertension admitted 2/25 with loculated left pleural effusion, concerning for possible infectious etiology  #Empyema s/p chest tube  placement 2/26 #Basilar Pneumothorax Overnight no acute events, remains afebrile and hemodynamically stable. Chest tube in place, -122 out last 24 hrs. PCCM consulted CT surgery, who do not believe patient would benefit from any further procedure. He is asymptomatic from dyspnea perspective. Plan to pull tube once chest tube drainage drops to 200cc/24 hrs.   -C/w vancomycin with dialysis for 4 weeks (day 5). -continue to track chest tube drainage. Pull tube per PCCM -culture w/ gram stain, + few Staph aureus, vanc sensitive. Resistant erythromycin. -fungal culture negative  #ESRD on MWF Appears euvolemic on exam today. Electrolytes within normal limits. Appreciate nephrology assistance.  - HD yesterday - daily BMP  #Chronic combined heart failure NYHA class III EF 30-35% on Echo in November. Patient reportedly had NM stress test afterwards but unable to locate in chart. Likely will need further ischemic eval/catheterization in outpatient setting, especially since he has plans to undergo kidney transplantation in the future. - F/u outpatient cardiology for ischemic evaluation - strict I/O  #Type II diabetes mellitus A1c 9.7 on admission. Continuing with SSI-moderate and lantus 2U. Patient with one episode of hypoglycemia in the 60's - Holding home glimepiride - would recommend d/c on discharge given his ESRD (contraindicated). Could consider changing to low dose glipizide. But would recommend GLP-1 agonist +/- low dose insulin. Will need optimization prior to transplant surgery.  - SSI-moderate, 2 U lantus nightly  #Chronic normocytic anemia Based on iron studies this admission, most likely anemia due to chronic disease/ESRD. No obvious signs of bleeding. Will continue to monitor. - aranesp with HD - daily CBC  DIET: HH IVF: n/a DVT PPX: Heparin BOWEL: n/a CODE: FULL FAM COM: n/a  Dispo: Anticipated discharge to Home in 3-4 days pending further work up of his empyema  [facility] Resident PGY-1 Pager [phone] Please contact the on call pager after 5 pm and on weekends at [phone].

## 2020-03-27 NOTE — Telephone Encounter (Signed)
Call made to patient, confirmed DOB. Made aware of appt time and date. Requested appt reminder be mailed to him. Info printed and mailed.   Nothing further needed at this time.

## 2020-03-27 NOTE — Discharge Instructions (Addendum)
Dr. Forrestine Him,   Thank you for allowing [place] to care for you during your hospitalization. We treated you for an empyema (collection of pus) in your lung. A drain was placed and removed by the pulmonologists. They would like you to follow up in their office, I have attached their information. Please call and make an appointment to be seen.   We will continue you on antibiotics for 3 weeks, take doxycycline 1 pill twice a day for 3 weeks. Please take tylenol for any pain and we wrote you for three days of oxycodone if you need it. Please take 1 oxycodone pill every 8 hours for severe pain  For your diabetes, we have switched you to a different medicine called glipizide. This is the same type of medicine you were on before for your diabetes, but is shown to be better in those with end stage kidney disease. If you find yourself getting lightheaded/dizzy with this new diabetes medicine or feel as though your sugars are too low, please stop taking the medicine and follow up with your primary care provider.   Dr. Moshe Cipro is scheduling you for dialysis tomorrow.   Please follow up with your primary care provider for a post hospital follow up appointment to review what occurred during your hospitalization and answer any questions you may have.

## 2020-03-27 NOTE — Progress Notes (Signed)
   NAME:  Kyle [name], MRN:  [id number], DOB:  1955-02-05, LOS: 6 ADMISSION DATE:  03/21/2020, CONSULTATION DATE:  2/26 REFERRING MD:  Macon Large, CHIEF COMPLAINT:  Chest tightness, dyspnea   Brief History:  65 y/o male admitted with chest tightness, dyspnea found to have a moderate to large left pleural effusion of uncertain etiology. At baseline has ESRD. New HFrEF EF 25%  Past Medical History:  ESRD on HD DM2 HTN HLD BiV HF CAD Pulmonary hypertension   Significant Hospital Events:  2/25 admitted to IMTS. L Pleural effusion not amenable to thora with IR due to loculated appearance 2/26 CT chest confirms complex, loculated effusion. PCCM consult for possible chest tube, management of effusion   Consults:  IR PCCM  Procedures:  2/26 pigtail (planned)  Significant Diagnostic Tests:  2/26 CT chest w con> Moderate L pleural effusion with pleural thickening, similar in appearance to Ct in 2021. Trace R effusion. Bilateral consolidative opacities (suspected rounded atelectasis). Cardiomegaly, small pericardial effusion. 10m focal pulm nodule. Three vessel CAD.   Micro Data:  2/25 SARS Cov2> neg 2/27 left pleural effusion >   Antimicrobials:     Interim History / Subjective:   No overnight events breathing is fine. Did well in HD yesterday. Patient seen and discussed with Nephrology MD.    Objective   Blood pressure (!) 144/81, pulse 97, temperature 97.7 F (36.5 C), temperature source Oral, resp. rate 20, height 6' (1.829 m), weight 72.9 kg, SpO2 95 %.        Intake/Output Summary (Last 24 hours) at 03/27/2020 0950 Last data filed at 03/27/2020 0850 Gross per 24 hour  Intake 160 ml  Output 3252 ml  Net -3092 ml   Filed Weights   03/24/20 0806 03/26/20 1105 03/26/20 1420  Weight: 75.6 kg 76.5 kg 72.9 kg    Examination: General: awake, alert, well appearing PULM: decreased left breath sounds CV: RRR GI: Soft nontender  3/1 ct chest personally  reviewed There is trapped lung. Improved appearance of empyema with some residual fluid. Some bilateral pleural irregularities, likely atelectasis  Body fluid Culture positive for Staph aureus  Resolved Hospital Problem list     Assessment & Plan:  Empyema - staph aureus positive.  - pulled chest tube today. Suspect there is some residual pneumothorax ex vacuo which will fill with fluid. I doubt this is large enough to cause him any respiratory difficulty long term. With some residual pocketI think he should 2 weeks total of anti-MRSA coverage. We will set him up in pulmonary clinic in a few weeks to ensure no worsening symptoms. No objection to discharge today from a pulmonary perspective.   NLenice Llamas MD Pulmonary and CRolling Hills EstatesPager: see AMION Office:[phone]

## 2020-04-23 ENCOUNTER — Encounter: Payer: Self-pay | Admitting: Internal Medicine

## 2020-04-23 ENCOUNTER — Other Ambulatory Visit: Payer: Self-pay

## 2020-04-23 ENCOUNTER — Ambulatory Visit: Payer: Medicare (Managed Care) | Admitting: Internal Medicine

## 2020-04-23 VITALS — BP 118/58 | HR 96 | Temp 97.7°F | Ht 72.0 in | Wt 161.0 lb

## 2020-04-23 DIAGNOSIS — L2989 Other pruritus: Secondary | ICD-10-CM | POA: Insufficient documentation

## 2020-04-23 DIAGNOSIS — R0602 Shortness of breath: Secondary | ICD-10-CM | POA: Diagnosis not present

## 2020-04-23 DIAGNOSIS — J869 Pyothorax without fistula: Secondary | ICD-10-CM

## 2020-04-23 DIAGNOSIS — L298 Other pruritus: Secondary | ICD-10-CM | POA: Insufficient documentation

## 2020-04-23 LAB — FUNGUS CULTURE WITH STAIN

## 2020-04-23 LAB — FUNGUS CULTURE RESULT

## 2020-04-23 LAB — FUNGAL ORGANISM REFLEX

## 2020-04-23 NOTE — Progress Notes (Signed)
Kyle [name]    [id number]    Jan 19, 1956  Primary Care Physician:Patient, No Pcp Per (Inactive) Date of Appointment: 04/23/2020 Established Patient Visit  Chief complaint:   Chief Complaint  Patient presents with  . Shortness of Breath    Reports feeling better since chest procedure and completing antibiotics. Dyspnea continues but has improved     HPI: Kyle [name] is a 65 y.o. gentleman with history of ESRD on HD and MRSA empyema in Feb 2020.  Interval Updates: Here for hospital follow up after chest tube drainage and antibiotics. Feeling better overall. Still fatigued but appetite is improving. Here with his wife. No chest pain. Still having some shortness of breath when going for walks. This is new since the infection.   I have reviewed the patient's family social and past medical history and updated as appropriate.   Past Medical History:  Diagnosis Date  . Abnormal nuclear stress test 09/23/2016  . Acute on chronic combined systolic and diastolic CHF, NYHA class 2 ([place]) 01/15/2014  . Acute respiratory failure with hypoxia ([place])   . Aftercare including intermittent dialysis ([place]) 07/31/2016  . Allergy, unspecified, initial encounter 10/04/2019  . Anaphylactic shock, unspecified, initial encounter 10/04/2019  . Anasarca 08/20/2014  . Anemia in chronic kidney disease 08/20/2014  . Arteriovenous graft infection ([place]) 08/29/2015  . Ascites 05/11/2016  . Bacteremia   . Benign essential HTN 05/11/2016   Formatting of this note might be different from the original. Last Assessment & Plan:  Blood pressure is running low on dialysis days. He is feeling weakAnd systolic ranges in the 62V. Decrease Coreg to 12.5 mg twice a day and follow.  Marland Kitchen CAD (coronary artery disease)   . Cardiac arrest ([place])    07/2014-[place] initiated  . Cardiorenal syndrome with renal failure   . CHF (congestive heart failure) ([place])   . Chronic systolic CHF (congestive heart failure) ([place])  09/23/2016  . Coronary artery disease involving native coronary artery of native heart without angina pectoris 05/11/2016   Formatting of this note might be different from the original. Last Assessment & Plan:  Continue aspirin and statin.  . Diabetes mellitus without complication (HCC)    Type 2  . DM (diabetes mellitus), type 2, uncontrolled, with renal complications ([place]) 0/35/0093  . Embolism due to vascular prosthetic devices, implants and grafts, initial encounter ([place]) 07/31/2016  . Encounter for immunization 07/31/2016  . Encounter for removal of sutures 11/08/2017  . Encounter for screening for cardiovascular disorders 07/31/2016  . Encounter for screening for respiratory tuberculosis 07/31/2016  . ESRD (end stage renal disease) ([place])    [place] M/W/F  . ESRD (end stage renal disease) on dialysis ([place]) 04/22/2016   Formatting of this note might be different from the original. Last Assessment & Plan:  Management per nephrology.  . Essential (primary) hypertension 07/31/2016  . Heart murmur    as a child  . Hyperlipidemia 01/01/2015   Formatting of this note might be different from the original. Last Assessment & Plan:  Continue statin.  Marland Kitchen Hypertension   . Infection and inflammatory reaction due to cardiac device, implant, and graft ([place]) 10/06/2015   Due to [place] (dialysis access graft)  . Iron deficiency anemia, unspecified 07/31/2016  . NSVT (nonsustained ventricular tachycardia) ([place]) 09/02/2014  . Pressure ulcer 08/22/2014  . Pure hypercholesterolemia, unspecified 01/21/2020  . Secondary cardiomyopathy ([place])   . Secondary hyperparathyroidism of renal origin ([place]) 07/31/2016  .  Staphylococcus aureus bacteremia 2015/09/17  . Sudden death history (living patient) 04/11/2015  . Thyroid nodule 01/01/2015   Formatting of this note might be different from the original. Last Assessment & Plan:  Patient has a thyroid nodule which needs biopsy. I have instructed him to follow-up with primary care for  this issue. I have also instructed him to contact me if there is any delays and I would arrange further evaluation.  . Troponin level elevated 08/20/2014  . Type 2 diabetes mellitus with other diabetic kidney complication ([place]) 01/30/1094  . Type 2 diabetes mellitus without complication ([place]) 0/45/4098  . Unspecified protein-calorie malnutrition ([place]) 06/19/2018    Past Surgical History:  Procedure Laterality Date  . AV FISTULA PLACEMENT Left 09/11/2014   Procedure: INSERTION OF LEFT ARM 6MM ARTERIOVENOUS GORTEX GRAFT ;  Surgeon: Elam Dutch, MD;  Location: [facility];  Service: Vascular;  Laterality: Left;  . AV FISTULA PLACEMENT Right 10/14/2015   Procedure: ARTERIOVENOUS (AV) FISTULA CREATION;  Surgeon: Angelia Mould, MD;  Location: [facility];  Service: Vascular;  Laterality: Right;  . AV FISTULA PLACEMENT Left 07/11/2018   Procedure: INSERTION OF ARTERIOVENOUS (AV) GORE-TEX STRETCH VASCULAR GRAFT LEFT ARM;  Surgeon: Angelia Mould, MD;  Location: [facility];  Service: Vascular;  Laterality: Left;  . CARDIAC CATHETERIZATION N/A 09/04/2014   Procedure: Right/Left Heart Cath and Coronary Angiography;  Surgeon: Peter M Martinique, MD;  Location: [facility];  Service: Cardiovascular;  Laterality: N/A;  . CHEST TUBE INSERTION Left 03/22/2020   Procedure: INSERTION PLEURAL DRAINAGE CATHETER;  Surgeon: Garner Nash, DO;  Location: [facility];  Service: Pulmonary;  Laterality: Left;  . EXTERNAL EAR SURGERY    . FINGER SURGERY     for infection  . INSERTION OF DIALYSIS CATHETER N/A 08/21/2014   Procedure: INSERTION OF DIALYSIS CATHETER RIGHT INTERNAL JUGULAR VEIN;  Surgeon: Conrad Francis, MD;  Location: [facility];  Service: Vascular;  Laterality: N/A;  MAC to General  . INSERTION OF DIALYSIS CATHETER N/A 09/08/2015   Procedure: INSERTION OF DIALYSIS CATHETER RIGHT INTERNAL JUGULAR;  Surgeon: Rosetta Posner, MD;  [facility];  Service: Vascular;  Laterality: N/A;  . LEFT HEART CATH AND CORONARY  ANGIOGRAPHY N/A 09/23/2016   Procedure: LEFT HEART CATH AND CORONARY ANGIOGRAPHY;  Surgeon: Martinique, Peter M, MD;  Location: [facility];  Service: Cardiovascular;  Laterality: N/A;  . PATCH ANGIOPLASTY Right 12/04/2015   Procedure: WITH 1 X 6 CM [place]isman ANGIOPLASTY TO RIGHT ARM BRACHIO-CEPHALIC FISTULA;  Surgeon: Conrad Riverview, MD;  Location: [facility];  Service: Vascular;  Laterality: Right;  . PERIPHERAL VASCULAR CATHETERIZATION N/A 12/04/2015   Procedure: Fistulagram - Right Arm;  Surgeon: Conrad [place], MD;  Location: [facility];  Service: Cardiovascular;  Laterality: N/A;  . REVISION OF ARTERIOVENOUS GORETEX GRAFT Left 08/31/2015   Procedure: REVISION OF LEFT ARTERIOVENOUS GORETEX GRAFT USING  AN INTERPOSITION  4-7MM GORTEX GRAFT WITH REMOVAL OF INFECTED STENT;  INTRAOPERATIVE ARTERIOGRAM TIMES ONE.;  Surgeon: Angelia Mould, MD;  Location: [facility];  Service: Vascular;  Laterality: Left;  . REVISON OF ARTERIOVENOUS FISTULA Right 12/04/2015   Procedure: REVISON OF RIGHT BRACHIO--CEPHALIC  ARTERIOVENOUS FISTULA;  Surgeon: Conrad Maurice, MD;  Location: [facility];  Service: Vascular;  Laterality: Right;  . TONSILLECTOMY      Family History  Problem Relation Age of Onset  . Heart failure Mother   . Diabetes Mother   . Hypertension Mother   .  Diabetes Father     Social History   Occupational History  . Not on file  Tobacco Use  . Smoking status: Former Smoker    Types: Cigarettes    Quit date: 06/1982    Years since quitting: 37.8  . Smokeless tobacco: Never Used  Vaping Use  . Vaping Use: Never used  Substance and Sexual Activity  . Alcohol use: No  . Drug use: No  . Sexual activity: Not on file     Physical Exam: Blood pressure (!) 118/58, pulse 96, temperature 97.7 F (36.5 C), height 6' (1.829 m), weight 161 lb (73 kg), SpO2 96 %.  Gen:      No acute distress Lungs:    No increased respiratory effort, symmetric chest wall excursion, no wheezes,  breath sounds diminished left base. CV:         Regular rate and rhythm; no murmurs, rubs, or gallops.  No pedal edema   Data Reviewed: Imaging: I have personally reviewed the CT Chest from Feb 2022 which shows persistent pneumothorax exvacuo with empyema with pigtail drainage in place.   PFTs: None on file  Labs:  Immunization status: Immunization History  Administered Date(s) Administered  . Influenza,inj,Quad PF,6+ Mos 01/15/2014  . PFIZER(Purple Top)SARS-COV-2 Vaccination 01/08/2020    Assessment:  MRSA Empyema ESRD on HD, consideration for renal transplantation Dyspnea on exertion, worsening HFrEF, EF 25%  Plan/Recommendations: Will obtain a full set of PFTs to evaluate his dyspnea. His case was discussed with thoracic surgery and he was not felt to benefit greatly from VATS at the time of initial evaluation. I worry his dyspnea is more likely related to his heart failure, encouraged him to follow up with cardiology.  I will see him back after PFTs.   Return to Care: Return in about 4 weeks (around 05/21/2020).   Lenice Llamas, MD Pulmonary and [place]

## 2020-04-23 NOTE — Patient Instructions (Signed)
The patient should have follow up scheduled with myself in 2-3 weeks  Prior to next visit patient should have: Full set of PFTs - 1 hour, next available with appt after.

## 2020-05-26 ENCOUNTER — Other Ambulatory Visit (HOSPITAL_COMMUNITY): Payer: Medicare (Managed Care)

## 2020-05-27 ENCOUNTER — Other Ambulatory Visit (HOSPITAL_COMMUNITY): Payer: Self-pay

## 2020-05-27 ENCOUNTER — Encounter (HOSPITAL_COMMUNITY): Payer: Self-pay | Admitting: Cardiology

## 2020-05-27 ENCOUNTER — Ambulatory Visit (HOSPITAL_COMMUNITY)
Admission: RE | Admit: 2020-05-27 | Discharge: 2020-05-27 | Disposition: A | Discharge status: Home / Self Care | Payer: Medicare (Managed Care) | Source: Ambulatory Visit | Attending: Cardiology | Admitting: Cardiology

## 2020-05-27 ENCOUNTER — Other Ambulatory Visit: Payer: Self-pay

## 2020-05-27 ENCOUNTER — Other Ambulatory Visit (HOSPITAL_COMMUNITY): Payer: Self-pay | Admitting: *Deleted

## 2020-05-27 VITALS — BP 140/70 | HR 83 | Wt 168.2 lb

## 2020-05-27 DIAGNOSIS — I252 Old myocardial infarction: Secondary | ICD-10-CM | POA: Insufficient documentation

## 2020-05-27 DIAGNOSIS — E785 Hyperlipidemia, unspecified: Secondary | ICD-10-CM | POA: Insufficient documentation

## 2020-05-27 DIAGNOSIS — I5022 Chronic systolic (congestive) heart failure: Secondary | ICD-10-CM

## 2020-05-27 DIAGNOSIS — I2510 Atherosclerotic heart disease of native coronary artery without angina pectoris: Secondary | ICD-10-CM | POA: Diagnosis not present

## 2020-05-27 DIAGNOSIS — Z7982 Long term (current) use of aspirin: Secondary | ICD-10-CM | POA: Diagnosis not present

## 2020-05-27 DIAGNOSIS — N186 End stage renal disease: Secondary | ICD-10-CM | POA: Diagnosis not present

## 2020-05-27 DIAGNOSIS — I132 Hypertensive heart and chronic kidney disease with heart failure and with stage 5 chronic kidney disease, or end stage renal disease: Secondary | ICD-10-CM | POA: Insufficient documentation

## 2020-05-27 DIAGNOSIS — E7800 Pure hypercholesterolemia, unspecified: Secondary | ICD-10-CM | POA: Insufficient documentation

## 2020-05-27 DIAGNOSIS — I1311 Hypertensive heart and chronic kidney disease without heart failure, with stage 5 chronic kidney disease, or end stage renal disease: Secondary | ICD-10-CM

## 2020-05-27 DIAGNOSIS — Z79899 Other long term (current) drug therapy: Secondary | ICD-10-CM | POA: Diagnosis not present

## 2020-05-27 DIAGNOSIS — I5042 Chronic combined systolic (congestive) and diastolic (congestive) heart failure: Secondary | ICD-10-CM | POA: Diagnosis not present

## 2020-05-27 DIAGNOSIS — Z87891 Personal history of nicotine dependence: Secondary | ICD-10-CM | POA: Diagnosis not present

## 2020-05-27 DIAGNOSIS — Z7984 Long term (current) use of oral hypoglycemic drugs: Secondary | ICD-10-CM | POA: Diagnosis not present

## 2020-05-27 DIAGNOSIS — Z992 Dependence on renal dialysis: Secondary | ICD-10-CM | POA: Diagnosis not present

## 2020-05-27 DIAGNOSIS — E1122 Type 2 diabetes mellitus with diabetic chronic kidney disease: Secondary | ICD-10-CM | POA: Diagnosis not present

## 2020-05-27 LAB — HEPATIC FUNCTION PANEL
ALT: 20 U/L (ref 0–44)
AST: 25 U/L (ref 15–41)
Albumin: 2.4 g/dL — ABNORMAL LOW (ref 3.5–5.0)
Alkaline Phosphatase: 118 U/L (ref 38–126)
Bilirubin, Direct: 0.2 mg/dL (ref 0.0–0.2)
Indirect Bilirubin: 0.4 mg/dL (ref 0.3–0.9)
Total Bilirubin: 0.6 mg/dL (ref 0.3–1.2)
Total Protein: 7.7 g/dL (ref 6.5–8.1)

## 2020-05-27 LAB — LIPID PANEL
Cholesterol: 84 mg/dL (ref 0–200)
HDL: 27 mg/dL — ABNORMAL LOW (ref >40)
LDL Cholesterol: 47 mg/dL (ref 0–99)
Total CHOL/HDL Ratio: 3.1 RATIO
Triglycerides: 50 mg/dL (ref <150)
VLDL: 10 mg/dL (ref 0–40)

## 2020-05-27 MED ORDER — SACUBITRIL-VALSARTAN 24-26 MG PO TABS: 1.0000 | ORAL_TABLET | Freq: Two times a day (BID) | ORAL | 5 refills | Status: DC

## 2020-05-27 MED ORDER — SACUBITRIL-VALSARTAN 24-26 MG PO TABS: 1.0000 | ORAL_TABLET | Freq: Two times a day (BID) | ORAL | 3 refills | Status: DC

## 2020-05-27 NOTE — Patient Instructions (Addendum)
STOP Lisinopril  START Entresto 24/'26mg'$  (1 tab) twice a day.  START this medication 2 days after stopping Lisinopril.  DO NOT TAKE this medication on the days you have dialysis.  Labs today We will only contact you if something comes back abnormal or we need to make some changes. Otherwise no news is good news!  Your physician has requested that you have an echocardiogram. Echocardiography is a painless test that uses sound waves to create images of your heart. It provides your doctor with information about the size and shape of your heart and how well your heart's chambers and valves are working. This procedure takes approximately one hour. There are no restrictions for this procedure.   Your physician recommends that you schedule a follow-up appointment in: 3 weeks with pharmacist and 2 months with Dr Aundra Dubin and an echo   Please call office at [phone] option 2 if you have any questions or concerns.   At the [facility], you and your health needs are our priority. As part of our continuing mission to provide you with exceptional heart care, we have created designated Provider Care Teams. These Care Teams include your primary Cardiologist (physician) and Advanced Practice Providers (APPs- Physician Assistants and Nurse Practitioners) who all work together to provide you with the care you need, when you need it.   You may see any of the following providers on your designated Care Team at your next follow up: Marland Kitchen Dr Glori Bickers . Dr Loralie Champagne . Dr Vickki Muff . Darrick Grinder, NP . Lyda Jester, [place] . Audry Riles, PharmD   Please be sure to bring in all your medications bottles to every appointment.

## 2020-05-28 NOTE — Progress Notes (Signed)
Cardiology: Dr. Geraldo Pitter HF Cardiology: Dr. Aundra Dubin  65 y.o. with history of nonischemic cardiomyopathy and chronic systolic CHF, CAD, and ESRD presents for evaluation of CHF.  Patient has a long history of nonischemic cardiomyopathy.  Echo back in 12/15 showed EF 15%. LV function has gone up and down over the years.  Echoes in 2018 and 2019 showed normal EF, but more recently, echo in 11/21 showed EF back down to 30-35% with EF 25% on Cardiolite.  Caths in 8/16 and 8/18 showed moderate stenosis in proximal non-dominant RCA, medical management.  Patient additionally has ESRD and has been interested in getting a renal transplant, but this effort is impeded by low LF systolic function.   Patient is currently taking Coreg and lisinopril.  He does not have trouble with lightheadedness or low BP. He reports very rare instances of low BP during dialysis despite BP-active med use.  He had mild dyspnea walking into the office today.  He is unable to walk fast.  He gets short of breath on stairs.  No chest pain.  No orthopnea/PND.    ECG (personally reviewed): NSR, LVH with repolarization abnormality.    PMH: 1. Chronic systolic CHF: Suspect primarily nonischemic cardiomyopathy.   - Echo (12/15): EF 15% - PEA arrest 7/19.  - Echo (7/16): EF 10% - LHC (8/16): LHC showed 80% proximal stenosis in a nondominant RCA.  - Echo (3/17): EF up to 55-60%.  - He had an abdominal fat pad biopsy negative for amyloidosis.  - Echo (5/18): EF 55-60%.  - LHC (8/18): 70% stenosis small nondominant proximal RCA.  - Echo (11/21): EF 30-35%, global hypokinesis, PASP 65 mmHg.  - Cardiolite (12/21): EF 25%, inferior and inferolateral infarct with peri-infarct ischemia.  2. CAD:  - LHC (8/16): LHC showed 80% proximal stenosis in a nondominant RCA.  - LHC (8/18): 70% stenosis small nondominant proximal RCA.  - Cardiolite (12/21): EF 25%, inferior and inferolateral infarct with peri-infarct ischemia.  3. ESRD 4. HTN 5. H/o  thyroid nodule 6. Type 2 diabetes  7. Hyperlipidemia 8. Empyema: Left chest, chest tube drainage in 2/22.   Social History   Socioeconomic History  . Marital status: Married    Spouse name: Not on file  . Number of children: Not on file  . Years of education: Not on file  . Highest education level: Not on file  Occupational History  . Not on file  Tobacco Use  . Smoking status: Former Smoker    Types: Cigarettes    Quit date: 06/1982    Years since quitting: 37.9  . Smokeless tobacco: Never Used  Vaping Use  . Vaping Use: Never used  Substance and Sexual Activity  . Alcohol use: No  . Drug use: No  . Sexual activity: Not on file  Other Topics Concern  . Not on file  Social History Narrative  . Not on file   Social Determinants of Health   Financial Resource Strain: Not on file  Food Insecurity: Not on file  Transportation Needs: Not on file  Physical Activity: Not on file  Stress: Not on file  Social Connections: Not on file  Intimate Partner Violence: Not on file   Family History  Problem Relation Age of Onset  . Heart failure Mother   . Diabetes Mother   . Hypertension Mother   . Diabetes Father    ROS: All systems reviewed and negative except as per HPI.   Current Outpatient Medications  Medication Sig Dispense Refill  .  acetaminophen (TYLENOL) 325 MG tablet Take 2 tablets (650 mg total) by mouth every 6 (six) hours as needed for mild pain (or Fever >/= 101). 90 tablet 0  . aspirin 81 MG tablet Take 81 mg by mouth daily.    . B Complex-C-Folic Acid (RENA-VITE RX) 1 MG TABS Take 1 tablet by mouth daily.  6  . calcium acetate (PHOSLO) 667 MG capsule Take 667 mg by mouth 3 (three) times daily with meals.    . carvedilol (COREG) 12.5 MG tablet Take 1 tablet (12.5 mg total) by mouth 2 (two) times daily with a meal. 180 tablet 0  . Darbepoetin Alfa (ARANESP) 40 MCG/0.4ML SOSY injection Inject 0.4 mLs (40 mcg total) into the vein every Monday with hemodialysis.  8.4 mL   . doxercalciferol (HECTOROL) 4 MCG/2ML injection Inject 1.5 mLs (3 mcg total) into the vein every Monday, Wednesday, and Friday with hemodialysis. 2 mL   . glipiZIDE (GLUCOTROL) 5 MG tablet Take 0.5 tablets (2.5 mg total) by mouth daily before breakfast. 90 tablet 0  . nitroGLYCERIN (NITROSTAT) 0.4 MG SL tablet Place 1 tablet (0.4 mg total) under the tongue every 5 (five) minutes as needed. 25 tablet 6  . rosuvastatin (CRESTOR) 5 MG tablet Take 5 mg by mouth daily.    . heparin 1000 unit/mL SOLN injection 1.5 mLs (1,500 Units total) by Dialysis route as needed (in dialysis).    . sacubitril-valsartan (ENTRESTO) 24-26 MG Take 1 tablet by mouth 2 (two) times daily. 180 tablet 3   No current facility-administered medications for this encounter.   BP 140/70   Pulse 83   Wt 76.3 kg (168 lb 3.2 oz)   SpO2 90%   BMI 22.81 kg/m  General: NAD Neck: JVP 8-9 cm, no thyromegaly or thyroid nodule.  Lungs: Decreased BS left base.  CV: Nondisplaced PMI.  Heart regular S1/S2, no S3/S4, no murmur.  No peripheral edema.  No carotid bruit.  Normal pedal pulses.  Abdomen: Soft, nontender, no hepatosplenomegaly, no distention.  Skin: Intact without lesions or rashes.  Neurologic: Alert and oriented x 3.  Psych: Normal affect. Extremities: No clubbing or cyanosis.  HEENT: Normal.   Assessment/Plan: 1. Chronic systolic CHF: EF has fluctuated significantly over the years.  Caths in 8/16 and 8/18 showed moderate disease in a small, nondominant RCA.  Cardiomyopathy therefore thought to be nonischemic.  Most recently, echo in 11/21 showed fall in EF from normal range in 5/18 to 30-35%.  Lexiscan Cardiolite showed EF 25% with inferior/inferolateral MI with peri-infarct ischemia.  NYHA class III symptoms.  On exam, patient is mildly volume overloaded.  - Would recommend lowering dry weight incrementally at dialysis.  - Continue Coreg 12.5 mg bid, hold dose on the mornings of HD.  - He has tolerated  lisinopril without hypotension at HD.  I will have him stop lisinopril and 36 hrs later start Entresto 24/26 bid (hold the dose on mornings of dialysis.  - With significant fall in EF and known CAD, I recommended coronary angiography.  He wants to make med changes and repeat echo before repeating cath.  - ICD of questionable benefit in ESRD patient.  2. CAD: Caths in 8/16 and 8/18 showed moderate disease in a small, nondominant RCA.  - See discussion above re: recommendation to repeat coronary angiography with significant drop in LV EF.  - Continue ASA 81 daily.  - Continue Crestor 5 mg daily.  Check lipids/LFTs.  3. ESRD: Patient is interested in renal transplant, but  he will need improvement in LV function prior to this.   Followup in 3 wks with clinical pharmacist for med titration.  See me in 2 months with echo.   Loralie Champagne 05/28/2020

## 2020-05-29 ENCOUNTER — Ambulatory Visit: Payer: Medicare (Managed Care) | Admitting: Internal Medicine

## 2020-05-29 NOTE — Progress Notes (Deleted)
Patient no-showed today's appointment; provider notified for review of record, patient agrees to reschedule missed appointment.

## 2020-07-03 ENCOUNTER — Telehealth: Payer: Self-pay | Admitting: *Deleted

## 2020-07-03 NOTE — Telephone Encounter (Signed)
   [facility] Pre-operative Risk Assessment    Patient Name: Kyle [name]  DOB: 1955/09/16  MRN: [id number]   [place]: - Please ensure there is not already an duplicate clearance open for this procedure. - Under Visit Info/Reason for Call, type in Other and utilize the format Clearance MM/DD/YY or Clearance TBD. Do not use dashes or single digits. - If request is for dental extraction, please clarify the # of teeth to be extracted. - If the patient is currently at the dentist's office, call Pre-Op APP to address. If the patient is not currently in the dentist office, please route to the Pre-Op pool  Request for surgical clearance: CLEARANCE REQUEST FOR DR. McLEAN WITH [facility]; PT HAS APPT WITH DR. Aundra Dubin 08/07/20.   What type of surgery is being performed? OPEN LEFT INGUINAL HERNIA REPAIR   When is this surgery scheduled? TBD (SOMETIME IN 07/2020)   What type of clearance is required (medical clearance vs. Pharmacy clearance to hold med vs. Both)? MEDICAL  Are there any medications that need to be held prior to surgery and how long? ASA    Practice name and name of physician performing surgery? [facility]; DR. Iven Finn PALMER   What is the office phone number? [phone]   7.   What is the office fax number? [phone]  8.   Anesthesia type (None, local, MAC, general) ? NOT LISTED   Julaine Hua 07/03/2020, 5:25 PM  _________________________________________________________________   (provider comments below)

## 2020-07-04 NOTE — Telephone Encounter (Signed)
Will forward back to [place] at [facility] in regards to notes from Dr. Aundra Dubin. If pt needs cath or echo [facility] nurse will set up. I will send FYI to requesting office.

## 2020-07-07 ENCOUNTER — Inpatient Hospital Stay (HOSPITAL_COMMUNITY)
Admission: RE | Admit: 2020-07-07 | Discharge: 2020-07-07 | Disposition: A | Discharge status: Home / Self Care | Payer: Medicare (Managed Care) | Source: Ambulatory Visit

## 2020-07-07 ENCOUNTER — Other Ambulatory Visit (HOSPITAL_COMMUNITY): Payer: Self-pay

## 2020-08-07 ENCOUNTER — Ambulatory Visit (HOSPITAL_BASED_OUTPATIENT_CLINIC_OR_DEPARTMENT_OTHER)
Admission: RE | Admit: 2020-08-07 | Discharge: 2020-08-07 | Disposition: A | Discharge status: Home / Self Care | Payer: Medicare (Managed Care) | Source: Ambulatory Visit | Attending: Cardiology | Admitting: Cardiology

## 2020-08-07 ENCOUNTER — Other Ambulatory Visit (HOSPITAL_COMMUNITY): Payer: Self-pay

## 2020-08-07 ENCOUNTER — Telehealth: Payer: Self-pay | Admitting: Cardiology

## 2020-08-07 ENCOUNTER — Other Ambulatory Visit: Payer: Self-pay

## 2020-08-07 ENCOUNTER — Encounter (HOSPITAL_COMMUNITY): Payer: Self-pay | Admitting: Cardiology

## 2020-08-07 ENCOUNTER — Ambulatory Visit (HOSPITAL_COMMUNITY)
Admission: RE | Admit: 2020-08-07 | Discharge: 2020-08-07 | Disposition: A | Discharge status: Home / Self Care | Payer: Medicare (Managed Care) | Source: Ambulatory Visit | Attending: Cardiology | Admitting: Cardiology

## 2020-08-07 VITALS — BP 148/76 | HR 85 | Wt 164.2 lb

## 2020-08-07 DIAGNOSIS — E1122 Type 2 diabetes mellitus with diabetic chronic kidney disease: Secondary | ICD-10-CM | POA: Insufficient documentation

## 2020-08-07 DIAGNOSIS — I5022 Chronic systolic (congestive) heart failure: Secondary | ICD-10-CM

## 2020-08-07 DIAGNOSIS — Z8674 Personal history of sudden cardiac arrest: Secondary | ICD-10-CM | POA: Insufficient documentation

## 2020-08-07 DIAGNOSIS — N186 End stage renal disease: Secondary | ICD-10-CM | POA: Insufficient documentation

## 2020-08-07 DIAGNOSIS — I132 Hypertensive heart and chronic kidney disease with heart failure and with stage 5 chronic kidney disease, or end stage renal disease: Secondary | ICD-10-CM | POA: Insufficient documentation

## 2020-08-07 DIAGNOSIS — Z8249 Family history of ischemic heart disease and other diseases of the circulatory system: Secondary | ICD-10-CM | POA: Diagnosis not present

## 2020-08-07 DIAGNOSIS — Z79899 Other long term (current) drug therapy: Secondary | ICD-10-CM | POA: Insufficient documentation

## 2020-08-07 DIAGNOSIS — Z833 Family history of diabetes mellitus: Secondary | ICD-10-CM | POA: Insufficient documentation

## 2020-08-07 DIAGNOSIS — Z111 Encounter for screening for respiratory tuberculosis: Secondary | ICD-10-CM

## 2020-08-07 DIAGNOSIS — I071 Rheumatic tricuspid insufficiency: Secondary | ICD-10-CM | POA: Diagnosis not present

## 2020-08-07 DIAGNOSIS — I313 Pericardial effusion (noninflammatory): Secondary | ICD-10-CM | POA: Diagnosis not present

## 2020-08-07 DIAGNOSIS — E785 Hyperlipidemia, unspecified: Secondary | ICD-10-CM | POA: Insufficient documentation

## 2020-08-07 DIAGNOSIS — Z87891 Personal history of nicotine dependence: Secondary | ICD-10-CM | POA: Insufficient documentation

## 2020-08-07 DIAGNOSIS — I5042 Chronic combined systolic (congestive) and diastolic (congestive) heart failure: Secondary | ICD-10-CM | POA: Diagnosis not present

## 2020-08-07 DIAGNOSIS — Z7984 Long term (current) use of oral hypoglycemic drugs: Secondary | ICD-10-CM | POA: Diagnosis not present

## 2020-08-07 DIAGNOSIS — Z7982 Long term (current) use of aspirin: Secondary | ICD-10-CM | POA: Diagnosis not present

## 2020-08-07 DIAGNOSIS — I2510 Atherosclerotic heart disease of native coronary artery without angina pectoris: Secondary | ICD-10-CM | POA: Insufficient documentation

## 2020-08-07 DIAGNOSIS — Z992 Dependence on renal dialysis: Secondary | ICD-10-CM | POA: Insufficient documentation

## 2020-08-07 LAB — BASIC METABOLIC PANEL
Anion gap: 10 (ref 5–15)
BUN: 25 mg/dL — ABNORMAL HIGH (ref 8–23)
CO2: 32 mmol/L (ref 22–32)
Calcium: 9.2 mg/dL (ref 8.9–10.3)
Chloride: 94 mmol/L — ABNORMAL LOW (ref 98–111)
Creatinine, Ser: 4.85 mg/dL — ABNORMAL HIGH (ref 0.61–1.24)
GFR, Estimated: 13 mL/min — ABNORMAL LOW (ref >60)
Glucose, Bld: 217 mg/dL — ABNORMAL HIGH (ref 70–99)
Potassium: 3.6 mmol/L (ref 3.5–5.1)
Sodium: 136 mmol/L (ref 135–145)

## 2020-08-07 LAB — CBC
HCT: 37.8 % — ABNORMAL LOW (ref 39.0–52.0)
Hemoglobin: 12.2 g/dL — ABNORMAL LOW (ref 13.0–17.0)
MCH: 30.8 pg (ref 26.0–34.0)
MCHC: 32.3 g/dL (ref 30.0–36.0)
MCV: 95.5 fL (ref 80.0–100.0)
Platelets: 273 10*3/uL (ref 150–400)
RBC: 3.96 MIL/uL — ABNORMAL LOW (ref 4.22–5.81)
RDW: 16.8 % — ABNORMAL HIGH (ref 11.5–15.5)
WBC: 7.7 10*3/uL (ref 4.0–10.5)
nRBC: 0 % (ref 0.0–0.2)

## 2020-08-07 LAB — ECHOCARDIOGRAM COMPLETE
Area-P 1/2: 5.02 cm2
Calc EF: 51.7 %
S' Lateral: 4.1 cm
Single Plane A2C EF: 53.4 %
Single Plane A4C EF: 49.8 %

## 2020-08-07 MED ORDER — ENTRESTO 49-51 MG PO TABS: 1.0000 | ORAL_TABLET | Freq: Two times a day (BID) | ORAL | 3 refills | Status: DC

## 2020-08-07 MED ORDER — SODIUM CHLORIDE 0.9% FLUSH: 3.0000 mL | Freq: Two times a day (BID) | INTRAVENOUS | Status: DC

## 2020-08-07 NOTE — H&P (View-Only) (Signed)
Cardiology: Dr. Geraldo Pitter HF Cardiology: Dr. Aundra Dubin  65 y.o. with history of nonischemic cardiomyopathy and chronic systolic CHF, CAD, and ESRD presents for evaluation of CHF.  Patient has a long history of nonischemic cardiomyopathy.  Echo back in 12/15 showed EF 15%. LV function has gone up and down over the years.  Echoes in 2018 and 2019 showed normal EF, but more recently, echo in 11/21 showed EF back down to 30-35% with EF 25% on Cardiolite.  Caths in 8/16 and 8/18 showed moderate stenosis in proximal non-dominant RCA, medical management.  Patient additionally has ESRD and has been interested in getting a renal transplant, but this effort is impeded by low LF systolic function.   Echo was done today and reviewed, EF 35% with diffuse hypokinesis, small pericardial effusion, moderate RV dilation with moderately decreased RV systolic function, PASP 64 mmHg, dilated IVC.   Patient returns for followup of CHF.  He is short of breath walking up stairs or walking about 100 feet.  No orthopnea/PND.  No lightheadedness.  He has tolerated Entresto without any episodes of hypotension with HD.  Weight is down 4 lbs.   Labs (5/22): LDL 47, HDL 27  PMH: 1. Chronic systolic CHF: Suspect primarily nonischemic cardiomyopathy.   - Echo (12/15): EF 15% - PEA arrest 7/19.  - Echo (7/16): EF 10% - LHC (8/16): LHC showed 80% proximal stenosis in a nondominant RCA.  - Echo (3/17): EF up to 55-60%.  - He had an abdominal fat pad biopsy negative for amyloidosis.  - Echo (5/18): EF 55-60%.  - LHC (8/18): 70% stenosis small nondominant proximal RCA.  - Echo (11/21): EF 30-35%, global hypokinesis, PASP 65 mmHg.  - Cardiolite (12/21): EF 25%, inferior and inferolateral infarct with peri-infarct ischemia.  - Echo (7/22): EF 35% with diffuse hypokinesis, small pericardial effusion, moderate RV dilation with moderately decreased RV systolic function, PASP 64 mmHg, dilated IVC.  2. CAD:  - LHC (8/16): LHC showed 80%  proximal stenosis in a nondominant RCA.  - LHC (8/18): 70% stenosis small nondominant proximal RCA.  - Cardiolite (12/21): EF 25%, inferior and inferolateral infarct with peri-infarct ischemia.  3. ESRD 4. HTN 5. H/o thyroid nodule 6. Type 2 diabetes  7. Hyperlipidemia 8. Empyema: Left chest, chest tube drainage in 2/22.   Social History   Socioeconomic History   Marital status: Married    Spouse name: Not on file   Number of children: Not on file   Years of education: Not on file   Highest education level: Not on file  Occupational History   Not on file  Tobacco Use   Smoking status: Former    Types: Cigarettes    Quit date: 06/1982    Years since quitting: 38.1   Smokeless tobacco: Never  Vaping Use   Vaping Use: Never used  Substance and Sexual Activity   Alcohol use: No   Drug use: No   Sexual activity: Not on file  Other Topics Concern   Not on file  Social History Narrative   Not on file   Social Determinants of Health   Financial Resource Strain: Not on file  Food Insecurity: Not on file  Transportation Needs: Not on file  Physical Activity: Not on file  Stress: Not on file  Social Connections: Not on file  Intimate Partner Violence: Not on file   Family History  Problem Relation Age of Onset   Heart failure Mother    Diabetes Mother    Hypertension  Mother    Diabetes Father    ROS: All systems reviewed and negative except as per HPI.   Current Outpatient Medications  Medication Sig Dispense Refill   acetaminophen (TYLENOL) 325 MG tablet Take 2 tablets (650 mg total) by mouth every 6 (six) hours as needed for mild pain (or Fever >/= 101). 90 tablet 0   aspirin 81 MG tablet Take 81 mg by mouth daily.     B Complex-C-Folic Acid (RENA-VITE RX) 1 MG TABS Take 1 tablet by mouth daily.  6   calcium acetate (PHOSLO) 667 MG capsule Take 667 mg by mouth 3 (three) times daily with meals.     carvedilol (COREG) 12.5 MG tablet Take 1 tablet (12.5 mg total) by  mouth 2 (two) times daily with a meal. 180 tablet 0   Darbepoetin Alfa (ARANESP) 40 MCG/0.4ML SOSY injection Inject 0.4 mLs (40 mcg total) into the vein every Monday with hemodialysis. 8.4 mL    doxercalciferol (HECTOROL) 4 MCG/2ML injection Inject 1.5 mLs (3 mcg total) into the vein every Monday, Wednesday, and Friday with hemodialysis. 2 mL    glipiZIDE (GLUCOTROL) 5 MG tablet Take 0.5 tablets (2.5 mg total) by mouth daily before breakfast. 90 tablet 0   heparin 1000 unit/mL SOLN injection 1.5 mLs (1,500 Units total) by Dialysis route as needed (in dialysis).     nitroGLYCERIN (NITROSTAT) 0.4 MG SL tablet Place 1 tablet (0.4 mg total) under the tongue every 5 (five) minutes as needed. 25 tablet 6   rosuvastatin (CRESTOR) 5 MG tablet Take 5 mg by mouth daily.     sacubitril-valsartan (ENTRESTO) 49-51 MG Take 1 tablet by mouth 2 (two) times daily. 60 tablet 3   No current facility-administered medications for this encounter.   BP (!) 148/76   Pulse 85   Wt 74.5 kg (164 lb 3.2 oz)   SpO2 90%   BMI 22.27 kg/m  General: NAD Neck: JVP 9-10 cm, no thyromegaly or thyroid nodule.  Lungs: Clear to auscultation bilaterally with normal respiratory effort. CV: Nondisplaced PMI.  Heart regular S1/S2, no S3/S4, 2/6 HSM LLSB.  No peripheral edema.  No carotid bruit.  Normal pedal pulses.  Abdomen: Soft, nontender, no hepatosplenomegaly, no distention.  Skin: Intact without lesions or rashes.  Neurologic: Alert and oriented x 3.  Psych: Normal affect. Extremities: No clubbing or cyanosis.  HEENT: Normal.   Assessment/Plan: 1. Chronic systolic CHF: EF has fluctuated significantly over the years.  Caths in 8/16 and 8/18 showed moderate disease in a small, nondominant RCA.  Cardiomyopathy therefore thought to be nonischemic.  Echo in 11/21 showed fall in EF from normal range in 5/18 to 30-35%.  Lexiscan Cardiolite showed EF 25% with inferior/inferolateral MI with peri-infarct ischemia.  Echo was done  today and reviewed, EF 35% with diffuse hypokinesis, small pericardial effusion, moderate RV dilation with moderately decreased RV systolic function, PASP 64 mmHg, dilated IVC. NYHA class III symptoms.  On exam, patient is mildly volume overloaded.  - Would recommend again lowering dry weight incrementally at dialysis.  - Continue Coreg 12.5 mg bid, hold dose on the mornings of HD.  - He has tolerated Entresto without hypotension at HD.  I will have him increase Entresto to 49/51 bid (hold the dose on mornings of dialysis).  - With significant fall in EF and known CAD, I recommended coronary angiography.  He is agreeable to this, we discussed risks/benefits of procedure.  Will plan LHC/RHC next week.  - ICD of questionable benefit  in ESRD patient.  2. CAD: Caths in 8/16 and 8/18 showed moderate disease in a small, nondominant RCA.  - See discussion above re: recommendation to repeat coronary angiography with significant drop in LV EF.  - Continue ASA 81 daily.  - Continue Crestor 5 mg daily.  Good LDL in 5/22.  3. ESRD: Patient is interested in renal transplant, but he will need improvement in LV function prior to this.   Followup after cath.   Loralie Champagne 08/07/2020

## 2020-08-07 NOTE — Telephone Encounter (Signed)
Garen Grams from [facility] is calling on behalf of the patient to have ekg faxed over.

## 2020-08-07 NOTE — Progress Notes (Signed)
Cardiology: Dr. Geraldo Pitter HF Cardiology: Dr. Aundra Dubin  65 y.o. with history of nonischemic cardiomyopathy and chronic systolic CHF, CAD, and ESRD presents for evaluation of CHF.  Patient has a long history of nonischemic cardiomyopathy.  Echo back in 12/15 showed EF 15%. LV function has gone up and down over the years.  Echoes in 2018 and 2019 showed normal EF, but more recently, echo in 11/21 showed EF back down to 30-35% with EF 25% on Cardiolite.  Caths in 8/16 and 8/18 showed moderate stenosis in proximal non-dominant RCA, medical management.  Patient additionally has ESRD and has been interested in getting a renal transplant, but this effort is impeded by low LF systolic function.   Echo was done today and reviewed, EF 35% with diffuse hypokinesis, small pericardial effusion, moderate RV dilation with moderately decreased RV systolic function, PASP 64 mmHg, dilated IVC.   Patient returns for followup of CHF.  He is short of breath walking up stairs or walking about 100 feet.  No orthopnea/PND.  No lightheadedness.  He has tolerated Entresto without any episodes of hypotension with HD.  Weight is down 4 lbs.   Labs (5/22): LDL 47, HDL 27  PMH: 1. Chronic systolic CHF: Suspect primarily nonischemic cardiomyopathy.   - Echo (12/15): EF 15% - PEA arrest 7/19.  - Echo (7/16): EF 10% - LHC (8/16): LHC showed 80% proximal stenosis in a nondominant RCA.  - Echo (3/17): EF up to 55-60%.  - He had an abdominal fat pad biopsy negative for amyloidosis.  - Echo (5/18): EF 55-60%.  - LHC (8/18): 70% stenosis small nondominant proximal RCA.  - Echo (11/21): EF 30-35%, global hypokinesis, PASP 65 mmHg.  - Cardiolite (12/21): EF 25%, inferior and inferolateral infarct with peri-infarct ischemia.  - Echo (7/22): EF 35% with diffuse hypokinesis, small pericardial effusion, moderate RV dilation with moderately decreased RV systolic function, PASP 64 mmHg, dilated IVC.  2. CAD:  - LHC (8/16): LHC showed 80%  proximal stenosis in a nondominant RCA.  - LHC (8/18): 70% stenosis small nondominant proximal RCA.  - Cardiolite (12/21): EF 25%, inferior and inferolateral infarct with peri-infarct ischemia.  3. ESRD 4. HTN 5. H/o thyroid nodule 6. Type 2 diabetes  7. Hyperlipidemia 8. Empyema: Left chest, chest tube drainage in 2/22.   Social History   Socioeconomic History   Marital status: Married    Spouse name: Not on file   Number of children: Not on file   Years of education: Not on file   Highest education level: Not on file  Occupational History   Not on file  Tobacco Use   Smoking status: Former    Types: Cigarettes    Quit date: 06/1982    Years since quitting: 38.1   Smokeless tobacco: Never  Vaping Use   Vaping Use: Never used  Substance and Sexual Activity   Alcohol use: No   Drug use: No   Sexual activity: Not on file  Other Topics Concern   Not on file  Social History Narrative   Not on file   Social Determinants of Health   Financial Resource Strain: Not on file  Food Insecurity: Not on file  Transportation Needs: Not on file  Physical Activity: Not on file  Stress: Not on file  Social Connections: Not on file  Intimate Partner Violence: Not on file   Family History  Problem Relation Age of Onset   Heart failure Mother    Diabetes Mother    Hypertension  Mother    Diabetes Father    ROS: All systems reviewed and negative except as per HPI.   Current Outpatient Medications  Medication Sig Dispense Refill   acetaminophen (TYLENOL) 325 MG tablet Take 2 tablets (650 mg total) by mouth every 6 (six) hours as needed for mild pain (or Fever >/= 101). 90 tablet 0   aspirin 81 MG tablet Take 81 mg by mouth daily.     B Complex-C-Folic Acid (RENA-VITE RX) 1 MG TABS Take 1 tablet by mouth daily.  6   calcium acetate (PHOSLO) 667 MG capsule Take 667 mg by mouth 3 (three) times daily with meals.     carvedilol (COREG) 12.5 MG tablet Take 1 tablet (12.5 mg total) by  mouth 2 (two) times daily with a meal. 180 tablet 0   Darbepoetin Alfa (ARANESP) 40 MCG/0.4ML SOSY injection Inject 0.4 mLs (40 mcg total) into the vein every Monday with hemodialysis. 8.4 mL    doxercalciferol (HECTOROL) 4 MCG/2ML injection Inject 1.5 mLs (3 mcg total) into the vein every Monday, Wednesday, and Friday with hemodialysis. 2 mL    glipiZIDE (GLUCOTROL) 5 MG tablet Take 0.5 tablets (2.5 mg total) by mouth daily before breakfast. 90 tablet 0   heparin 1000 unit/mL SOLN injection 1.5 mLs (1,500 Units total) by Dialysis route as needed (in dialysis).     nitroGLYCERIN (NITROSTAT) 0.4 MG SL tablet Place 1 tablet (0.4 mg total) under the tongue every 5 (five) minutes as needed. 25 tablet 6   rosuvastatin (CRESTOR) 5 MG tablet Take 5 mg by mouth daily.     sacubitril-valsartan (ENTRESTO) 49-51 MG Take 1 tablet by mouth 2 (two) times daily. 60 tablet 3   No current facility-administered medications for this encounter.   BP (!) 148/76   Pulse 85   Wt 74.5 kg (164 lb 3.2 oz)   SpO2 90%   BMI 22.27 kg/m  General: NAD Neck: JVP 9-10 cm, no thyromegaly or thyroid nodule.  Lungs: Clear to auscultation bilaterally with normal respiratory effort. CV: Nondisplaced PMI.  Heart regular S1/S2, no S3/S4, 2/6 HSM LLSB.  No peripheral edema.  No carotid bruit.  Normal pedal pulses.  Abdomen: Soft, nontender, no hepatosplenomegaly, no distention.  Skin: Intact without lesions or rashes.  Neurologic: Alert and oriented x 3.  Psych: Normal affect. Extremities: No clubbing or cyanosis.  HEENT: Normal.   Assessment/Plan: 1. Chronic systolic CHF: EF has fluctuated significantly over the years.  Caths in 8/16 and 8/18 showed moderate disease in a small, nondominant RCA.  Cardiomyopathy therefore thought to be nonischemic.  Echo in 11/21 showed fall in EF from normal range in 5/18 to 30-35%.  Lexiscan Cardiolite showed EF 25% with inferior/inferolateral MI with peri-infarct ischemia.  Echo was done  today and reviewed, EF 35% with diffuse hypokinesis, small pericardial effusion, moderate RV dilation with moderately decreased RV systolic function, PASP 64 mmHg, dilated IVC. NYHA class III symptoms.  On exam, patient is mildly volume overloaded.  - Would recommend again lowering dry weight incrementally at dialysis.  - Continue Coreg 12.5 mg bid, hold dose on the mornings of HD.  - He has tolerated Entresto without hypotension at HD.  I will have him increase Entresto to 49/51 bid (hold the dose on mornings of dialysis).  - With significant fall in EF and known CAD, I recommended coronary angiography.  He is agreeable to this, we discussed risks/benefits of procedure.  Will plan LHC/RHC next week.  - ICD of questionable benefit  in ESRD patient.  2. CAD: Caths in 8/16 and 8/18 showed moderate disease in a small, nondominant RCA.  - See discussion above re: recommendation to repeat coronary angiography with significant drop in LV EF.  - Continue ASA 81 daily.  - Continue Crestor 5 mg daily.  Good LDL in 5/22.  3. ESRD: Patient is interested in renal transplant, but he will need improvement in LV function prior to this.   Followup after cath.   Loralie Champagne 08/07/2020

## 2020-08-07 NOTE — Progress Notes (Signed)
  Echocardiogram 2D Echocardiogram has been performed.  Kyle [name] 08/07/2020, 12:28 PM

## 2020-08-07 NOTE — Patient Instructions (Addendum)
Increase Entresto to 49/51 mg Twice daily  Your physician recommends that you schedule a follow-up appointment in: 2-3  weeks after angiogram     You are scheduled for a Cardiac Catheterization on Tuesday, July 19 with Dr. Loralie Champagne.  1. Please arrive at the [facility] (Main Entrance A) at [facility]: [address] [place], [place] [address] at 11:00 AM (This time is two hours before your procedure to ensure your preparation). Free valet parking service is available.   Special note: Every effort is made to have your procedure done on time. Please understand that emergencies sometimes delay scheduled procedures.  2. Diet: Do not eat solid foods after midnight.  The patient may have clear liquids until 5am upon the day of the procedure.   4. Medication instructions in preparation for your procedure:   Contrast Allergy: No   *For reference purposes while preparing patient instructions.   Delete this med list prior to printing instructions for patient.*   Hold Glipizide on day of procedure  On the morning of your procedure, take your Aspirin and any morning medicines NOT listed above.  You may use sips of water.  5. Plan for one night stay--bring personal belongings. 6. Bring a current list of your medications and current insurance cards. 7. You MUST have a responsible person to drive you home. 8. Someone MUST be with you the first 24 hours after you arrive home or your discharge will be delayed. 9. Please wear clothes that are easy to get on and off and wear slip-on shoes.  Thank you for allowing [place] to care for you!   -- [facility] If you have any questions or concerns before your next appointment please send [place] a message through [facility] or call our office at [phone].    TO LEAVE A MESSAGE FOR THE NURSE SELECT OPTION 2, PLEASE LEAVE A MESSAGE INCLUDING: YOUR NAME DATE OF BIRTH CALL BACK NUMBER REASON FOR CALL**this is  important as we prioritize the call backs  YOU WILL RECEIVE A CALL BACK THE SAME DAY AS LONG AS YOU CALL BEFORE 4:00 PM milAt the [facility], you and your health needs are our priority. As part of our continuing mission to provide you with exceptional heart care, we have created designated Provider Care Teams. These Care Teams include your primary Cardiologist (physician) and Advanced Practice Providers (APPs- Physician Assistants and Nurse Practitioners) who all work together to provide you with the care you need, when you need it.   You may see any of the following providers on your designated Care Team at your next follow up: Dr Glori Bickers Dr Loralie Champagne Dr Patrice Paradise, NP Lyda Jester, [place] Ginnie Smart Audry Riles, PharmD   Please be sure to bring in all your medications bottles to every appointment.

## 2020-08-07 NOTE — Telephone Encounter (Signed)
Per Ann EKG had already been faxed.

## 2020-08-08 ENCOUNTER — Telehealth (HOSPITAL_COMMUNITY): Payer: Self-pay | Admitting: *Deleted

## 2020-08-08 NOTE — Telephone Encounter (Addendum)
Pre cert for r/l heart cath [facility]

## 2020-08-12 ENCOUNTER — Ambulatory Visit (HOSPITAL_COMMUNITY)
Admission: RE | Admit: 2020-08-12 | Discharge: 2020-08-12 | Disposition: A | Discharge status: Home / Self Care | Payer: Medicare (Managed Care) | Attending: Cardiology | Admitting: Cardiology

## 2020-08-12 ENCOUNTER — Telehealth (HOSPITAL_COMMUNITY): Payer: Self-pay | Admitting: *Deleted

## 2020-08-12 ENCOUNTER — Encounter (HOSPITAL_COMMUNITY)
Admission: RE | Disposition: A | Discharge status: Home / Self Care | Payer: Self-pay | Source: Home / Self Care | Attending: Cardiology

## 2020-08-12 ENCOUNTER — Other Ambulatory Visit: Payer: Self-pay

## 2020-08-12 ENCOUNTER — Other Ambulatory Visit (HOSPITAL_COMMUNITY): Payer: Self-pay

## 2020-08-12 DIAGNOSIS — Z79899 Other long term (current) drug therapy: Secondary | ICD-10-CM | POA: Insufficient documentation

## 2020-08-12 DIAGNOSIS — Z992 Dependence on renal dialysis: Secondary | ICD-10-CM | POA: Diagnosis not present

## 2020-08-12 DIAGNOSIS — I428 Other cardiomyopathies: Secondary | ICD-10-CM | POA: Insufficient documentation

## 2020-08-12 DIAGNOSIS — Z87891 Personal history of nicotine dependence: Secondary | ICD-10-CM | POA: Insufficient documentation

## 2020-08-12 DIAGNOSIS — I2510 Atherosclerotic heart disease of native coronary artery without angina pectoris: Secondary | ICD-10-CM | POA: Insufficient documentation

## 2020-08-12 DIAGNOSIS — E1122 Type 2 diabetes mellitus with diabetic chronic kidney disease: Secondary | ICD-10-CM | POA: Insufficient documentation

## 2020-08-12 DIAGNOSIS — Z7982 Long term (current) use of aspirin: Secondary | ICD-10-CM | POA: Insufficient documentation

## 2020-08-12 DIAGNOSIS — I132 Hypertensive heart and chronic kidney disease with heart failure and with stage 5 chronic kidney disease, or end stage renal disease: Secondary | ICD-10-CM | POA: Diagnosis present

## 2020-08-12 DIAGNOSIS — I2721 Secondary pulmonary arterial hypertension: Secondary | ICD-10-CM | POA: Insufficient documentation

## 2020-08-12 DIAGNOSIS — Z8249 Family history of ischemic heart disease and other diseases of the circulatory system: Secondary | ICD-10-CM | POA: Diagnosis not present

## 2020-08-12 DIAGNOSIS — Z833 Family history of diabetes mellitus: Secondary | ICD-10-CM | POA: Diagnosis not present

## 2020-08-12 DIAGNOSIS — N186 End stage renal disease: Secondary | ICD-10-CM | POA: Insufficient documentation

## 2020-08-12 DIAGNOSIS — Z7984 Long term (current) use of oral hypoglycemic drugs: Secondary | ICD-10-CM | POA: Insufficient documentation

## 2020-08-12 DIAGNOSIS — I5022 Chronic systolic (congestive) heart failure: Secondary | ICD-10-CM | POA: Diagnosis not present

## 2020-08-12 HISTORY — PX: RIGHT/LEFT HEART CATH AND CORONARY ANGIOGRAPHY: CATH118266

## 2020-08-12 LAB — GLUCOSE, CAPILLARY
Glucose-Capillary: 125 mg/dL — ABNORMAL HIGH (ref 70–99)
Glucose-Capillary: 88 mg/dL (ref 70–99)

## 2020-08-12 LAB — POCT I-STAT EG7
Acid-Base Excess: 7 mmol/L — ABNORMAL HIGH (ref 0.0–2.0)
Acid-Base Excess: 8 mmol/L — ABNORMAL HIGH (ref 0.0–2.0)
Bicarbonate: 32.4 mmol/L — ABNORMAL HIGH (ref 20.0–28.0)
Bicarbonate: 33.2 mmol/L — ABNORMAL HIGH (ref 20.0–28.0)
Calcium, Ion: 1.2 mmol/L (ref 1.15–1.40)
Calcium, Ion: 1.21 mmol/L (ref 1.15–1.40)
HCT: 35 % — ABNORMAL LOW (ref 39.0–52.0)
HCT: 35 % — ABNORMAL LOW (ref 39.0–52.0)
Hemoglobin: 11.9 g/dL — ABNORMAL LOW (ref 13.0–17.0)
Hemoglobin: 11.9 g/dL — ABNORMAL LOW (ref 13.0–17.0)
O2 Saturation: 76 %
O2 Saturation: 77 %
Potassium: 3.9 mmol/L (ref 3.5–5.1)
Potassium: 4 mmol/L (ref 3.5–5.1)
Sodium: 138 mmol/L (ref 135–145)
Sodium: 139 mmol/L (ref 135–145)
TCO2: 34 mmol/L — ABNORMAL HIGH (ref 22–32)
TCO2: 35 mmol/L — ABNORMAL HIGH (ref 22–32)
pCO2, Ven: 49.5 mmHg (ref 44.0–60.0)
pCO2, Ven: 49.6 mmHg (ref 44.0–60.0)
pH, Ven: 7.424 (ref 7.250–7.430)
pH, Ven: 7.434 — ABNORMAL HIGH (ref 7.250–7.430)
pO2, Ven: 41 mmHg (ref 32.0–45.0)
pO2, Ven: 41 mmHg (ref 32.0–45.0)

## 2020-08-12 SURGERY — RIGHT/LEFT HEART CATH AND CORONARY ANGIOGRAPHY
Anesthesia: LOCAL

## 2020-08-12 MED ORDER — HEPARIN (PORCINE) IN NACL 1000-0.9 UT/500ML-% IV SOLN
INTRAVENOUS | Status: DC | PRN
  Administered 2020-08-12 (×2): 500 mL

## 2020-08-12 MED ORDER — ACETAMINOPHEN 325 MG PO TABS: 650.0000 mg | ORAL_TABLET | ORAL | Status: DC | PRN

## 2020-08-12 MED ORDER — FENTANYL CITRATE (PF) 100 MCG/2ML IJ SOLN
INTRAMUSCULAR | Status: DC | PRN
  Administered 2020-08-12: 25 ug via INTRAVENOUS

## 2020-08-12 MED ORDER — MIDAZOLAM HCL 2 MG/2ML IJ SOLN
INTRAMUSCULAR | Status: AC
  Filled 2020-08-12: qty 2

## 2020-08-12 MED ORDER — ASPIRIN 81 MG PO CHEW
81.0000 mg | CHEWABLE_TABLET | ORAL | Status: DC
Start: 2020-08-12 — End: 2020-08-12

## 2020-08-12 MED ORDER — LIDOCAINE HCL (PF) 1 % IJ SOLN
INTRAMUSCULAR | Status: AC
  Filled 2020-08-12: qty 30

## 2020-08-12 MED ORDER — HYDRALAZINE HCL 20 MG/ML IJ SOLN: 10.0000 mg | INTRAMUSCULAR | Status: DC | PRN

## 2020-08-12 MED ORDER — SODIUM CHLORIDE 0.9% FLUSH: 3.0000 mL | INTRAVENOUS | Status: DC | PRN

## 2020-08-12 MED ORDER — CLOPIDOGREL BISULFATE 75 MG PO TABS
75.0000 mg | ORAL_TABLET | Freq: Every day | ORAL | 11 refills | Status: DC
  Filled 2020-08-12: qty 30, 30d supply, fill #0

## 2020-08-12 MED ORDER — IOHEXOL 350 MG/ML SOLN
INTRAVENOUS | Status: DC | PRN
  Administered 2020-08-12: 75 mL

## 2020-08-12 MED ORDER — SODIUM CHLORIDE 0.9 % IV SOLN: INTRAVENOUS | Status: DC

## 2020-08-12 MED ORDER — SODIUM CHLORIDE 0.9 % IV SOLN: 250.0000 mL | INTRAVENOUS | Status: DC | PRN

## 2020-08-12 MED ORDER — LABETALOL HCL 5 MG/ML IV SOLN: 10.0000 mg | INTRAVENOUS | Status: DC | PRN

## 2020-08-12 MED ORDER — ONDANSETRON HCL 4 MG/2ML IJ SOLN: 4.0000 mg | Freq: Four times a day (QID) | INTRAMUSCULAR | Status: DC | PRN

## 2020-08-12 MED ORDER — HEPARIN (PORCINE) IN NACL 1000-0.9 UT/500ML-% IV SOLN
INTRAVENOUS | Status: AC
  Filled 2020-08-12: qty 1000

## 2020-08-12 MED ORDER — MIDAZOLAM HCL 2 MG/2ML IJ SOLN
INTRAMUSCULAR | Status: DC | PRN
  Administered 2020-08-12: 1 mg via INTRAVENOUS

## 2020-08-12 MED ORDER — ASPIRIN 81 MG PO CHEW: 81.0000 mg | CHEWABLE_TABLET | ORAL | Status: DC

## 2020-08-12 MED ORDER — SODIUM CHLORIDE 0.9% FLUSH: 3.0000 mL | Freq: Two times a day (BID) | INTRAVENOUS | Status: DC

## 2020-08-12 MED ORDER — FENTANYL CITRATE (PF) 100 MCG/2ML IJ SOLN
INTRAMUSCULAR | Status: AC
  Filled 2020-08-12: qty 2

## 2020-08-12 MED ORDER — LIDOCAINE HCL (PF) 1 % IJ SOLN
INTRAMUSCULAR | Status: DC | PRN
  Administered 2020-08-12: 15 mL via INTRADERMAL

## 2020-08-12 SURGICAL SUPPLY — 9 items
CATH 5FR JL3.5 JR4 ANG PIG MP (CATHETERS) ×1 IMPLANT
CATH SWAN GANZ 7F STRAIGHT (CATHETERS) ×1 IMPLANT
KIT HEART LEFT (KITS) ×2 IMPLANT
PACK CARDIAC CATHETERIZATION (CUSTOM PROCEDURE TRAY) ×2 IMPLANT
SHEATH PINNACLE 5F 10CM (SHEATH) ×1 IMPLANT
SHEATH PINNACLE 7F 10CM (SHEATH) ×1 IMPLANT
SHEATH PROBE COVER 6X72 (BAG) ×1 IMPLANT
TRANSDUCER W/STOPCOCK (MISCELLANEOUS) ×2 IMPLANT
WIRE EMERALD 3MM-J .035X150CM (WIRE) ×1 IMPLANT

## 2020-08-12 NOTE — Discharge Instructions (Signed)
Please arrive at short stay on Tuesday 7/26 @ 7:00 am.  Nothing to eat or drink after midnight.  Ok to take morning medications with the exception of your diabetic medication.

## 2020-08-12 NOTE — Telephone Encounter (Signed)
Pt left vm after 4pm yesterday about dietary restrictions before cath. I returned call to patient no answer/left vm for return call.

## 2020-08-12 NOTE — Progress Notes (Signed)
Site Area:  RFA x 1, RFV x 1 Site prior to removal: Level  0 Pressure applied for:  20 minutes Manual:  yes Pt status during pull:  stable Post pull site:  level 0 Post pull instructions given:  yes Post pull pulses present:  DP Dressing applied:  gauze/tegaderm Bedrest begins @:  1530 Comments: Removed by Vear Clock RN

## 2020-08-12 NOTE — Interval H&P Note (Signed)
History and Physical Interval Note:  08/12/2020 2:02 PM  Kyle [name]  has presented today for surgery, with the diagnosis of heart failure.  The various methods of treatment have been discussed with the patient and family. After consideration of risks, benefits and other options for treatment, the patient has consented to  Procedure(s): RIGHT/LEFT HEART CATH AND CORONARY ANGIOGRAPHY (N/A) as a surgical intervention.  [name] history has been reviewed, patient examined, no change in status, stable for surgery.  I have reviewed the patient's chart and labs.  Questions were answered to the patient's satisfaction.     Blessing Zaucha [employer]

## 2020-08-13 ENCOUNTER — Encounter (HOSPITAL_COMMUNITY): Payer: Self-pay | Admitting: Cardiology

## 2020-08-14 ENCOUNTER — Telehealth (HOSPITAL_COMMUNITY): Payer: Self-pay | Admitting: *Deleted

## 2020-08-14 NOTE — Telephone Encounter (Signed)
Pt left VM asking when is it ok to take off bandage from cath he had on Tuesday. I called pt back to advise to take it off now per Dr.McLean and pt did not answer also tried pts wifes phone and no answer.

## 2020-08-15 ENCOUNTER — Other Ambulatory Visit (HOSPITAL_COMMUNITY): Payer: Self-pay

## 2020-08-15 ENCOUNTER — Telehealth (HOSPITAL_COMMUNITY): Payer: Self-pay | Admitting: Pharmacist

## 2020-08-15 NOTE — Telephone Encounter (Signed)
Pharmacy Transitions of Care Follow-up Telephone Call  Date of discharge: 08/12/2020  Discharge Diagnosis: Chronic CHF  How have you been since you were released from the hospital? Good   Medication changes made at discharge:  - START: clopidogrel  - STOPPED: n/A  - CHANGED: n/a  Medication changes verified by the patient? Yes (Yes/No)    Medication Accessibility:  Home Pharmacy: [facility]   Was the patient provided with refills on discharged medications? Yes   Have all prescriptions been transferred from [facility] to home pharmacy? Yes   Is the patient able to afford medications? Yes    Medication Review:  CLOPIDOGREL (PLAVIX) Clopidogrel 75 mg once daily.  - Educated patient on expected duration of therapy of  with clopidogrel. Advised patient that aspirin will be continued indefinitely.  - Reviewed potential DDIs with patient  - Advised patient of medications to avoid (NSAIDs, ASA)  - Educated that Tylenol (acetaminophen) will be the preferred analgesic to prevent risk of bleeding  - Emphasized importance of monitoring for signs and symptoms of bleeding (abnormal bruising, prolonged bleeding, nose bleeds, bleeding from gums, discolored urine, black tarry stools)  - Advised patient to alert all providers of anticoagulation therapy prior to starting a new medication or having a procedure    Follow-up Appointments:  PCP Hospital f/u appt confirmed? Yes  Specialist Hospital f/u appt confirmed? Yes If their condition worsens, is the pt aware to call PCP or go to the Emergency Dept.? Yes  Final Patient Assessment: Patient is feeling better since being at home.  Reviewed medication with patient.

## 2020-08-18 ENCOUNTER — Telehealth: Payer: Self-pay | Admitting: *Deleted

## 2020-08-18 NOTE — Telephone Encounter (Signed)
No answer, voicemail message. 

## 2020-08-18 NOTE — Telephone Encounter (Signed)
Patient is returning call to discuss procedure instructions.

## 2020-08-18 NOTE — Telephone Encounter (Addendum)
Pt contacted pre-coronary stent scheduled at [facility] for: Tuesday August 19, 2020 9 AM Verified arrival time and place: [facility]) at: 6:30 AM-needs BMP/CBC   No solid food after midnight prior to cath, clear liquids until 5 AM day of procedure.  Hold: Glipizide-AM of procedure  Except hold medications AM meds can be  taken pre-cath with sips of water including: aspirin 81 mg Plavix 75 mg  Confirmed patient has responsible adult to drive home post procedure and be with patient first 24 hours after arriving home:  You are allowed ONE visitor in the waiting room during the time you are at the hospital for your procedure. Both you and your visitor must wear a mask once you enter the hospital.   Patient reports does not currently have any symptoms concerning for COVID-19 and no household members with COVID-19 like illness.      LMTCB to review procedure instructions with patient.

## 2020-08-18 NOTE — Telephone Encounter (Signed)
Reviewed procedure/mask/visitor instructions with patient..  Patient confirmed he has dialysis M-W-F.

## 2020-08-18 NOTE — Telephone Encounter (Signed)
LMTCB to review procedure instructions 

## 2020-08-19 ENCOUNTER — Encounter (HOSPITAL_COMMUNITY): Payer: Self-pay | Admitting: Cardiology

## 2020-08-19 ENCOUNTER — Ambulatory Visit (HOSPITAL_COMMUNITY)
Admission: RE | Admit: 2020-08-19 | Discharge: 2020-08-20 | Disposition: A | Discharge status: Home / Self Care | Payer: Medicare (Managed Care) | Attending: Cardiology | Admitting: Cardiology

## 2020-08-19 ENCOUNTER — Other Ambulatory Visit: Payer: Self-pay

## 2020-08-19 ENCOUNTER — Ambulatory Visit (HOSPITAL_COMMUNITY)
Admission: RE | Disposition: A | Discharge status: Home / Self Care | Payer: Self-pay | Source: Home / Self Care | Attending: Cardiology

## 2020-08-19 DIAGNOSIS — E1122 Type 2 diabetes mellitus with diabetic chronic kidney disease: Secondary | ICD-10-CM | POA: Diagnosis not present

## 2020-08-19 DIAGNOSIS — Z7982 Long term (current) use of aspirin: Secondary | ICD-10-CM | POA: Diagnosis not present

## 2020-08-19 DIAGNOSIS — Z833 Family history of diabetes mellitus: Secondary | ICD-10-CM | POA: Insufficient documentation

## 2020-08-19 DIAGNOSIS — I132 Hypertensive heart and chronic kidney disease with heart failure and with stage 5 chronic kidney disease, or end stage renal disease: Secondary | ICD-10-CM | POA: Insufficient documentation

## 2020-08-19 DIAGNOSIS — Z8249 Family history of ischemic heart disease and other diseases of the circulatory system: Secondary | ICD-10-CM | POA: Insufficient documentation

## 2020-08-19 DIAGNOSIS — Z87891 Personal history of nicotine dependence: Secondary | ICD-10-CM | POA: Insufficient documentation

## 2020-08-19 DIAGNOSIS — I428 Other cardiomyopathies: Secondary | ICD-10-CM | POA: Diagnosis not present

## 2020-08-19 DIAGNOSIS — I2510 Atherosclerotic heart disease of native coronary artery without angina pectoris: Secondary | ICD-10-CM

## 2020-08-19 DIAGNOSIS — IMO0002 Reserved for concepts with insufficient information to code with codable children: Secondary | ICD-10-CM | POA: Diagnosis present

## 2020-08-19 DIAGNOSIS — Z79899 Other long term (current) drug therapy: Secondary | ICD-10-CM | POA: Insufficient documentation

## 2020-08-19 DIAGNOSIS — Z955 Presence of coronary angioplasty implant and graft: Secondary | ICD-10-CM

## 2020-08-19 DIAGNOSIS — I5043 Acute on chronic combined systolic (congestive) and diastolic (congestive) heart failure: Secondary | ICD-10-CM | POA: Insufficient documentation

## 2020-08-19 DIAGNOSIS — N186 End stage renal disease: Secondary | ICD-10-CM | POA: Diagnosis not present

## 2020-08-19 DIAGNOSIS — I10 Essential (primary) hypertension: Secondary | ICD-10-CM | POA: Diagnosis present

## 2020-08-19 DIAGNOSIS — E1129 Type 2 diabetes mellitus with other diabetic kidney complication: Secondary | ICD-10-CM | POA: Diagnosis present

## 2020-08-19 DIAGNOSIS — Z7984 Long term (current) use of oral hypoglycemic drugs: Secondary | ICD-10-CM | POA: Insufficient documentation

## 2020-08-19 DIAGNOSIS — R9439 Abnormal result of other cardiovascular function study: Secondary | ICD-10-CM | POA: Diagnosis present

## 2020-08-19 HISTORY — PX: INTRAVASCULAR IMAGING/OCT: CATH118326

## 2020-08-19 HISTORY — PX: CORONARY STENT INTERVENTION: CATH118234

## 2020-08-19 LAB — CBC
HCT: 34.3 % — ABNORMAL LOW (ref 39.0–52.0)
Hemoglobin: 11.4 g/dL — ABNORMAL LOW (ref 13.0–17.0)
MCH: 31.4 pg (ref 26.0–34.0)
MCHC: 33.2 g/dL (ref 30.0–36.0)
MCV: 94.5 fL (ref 80.0–100.0)
Platelets: 211 10*3/uL (ref 150–400)
RBC: 3.63 MIL/uL — ABNORMAL LOW (ref 4.22–5.81)
RDW: 18 % — ABNORMAL HIGH (ref 11.5–15.5)
WBC: 6.9 10*3/uL (ref 4.0–10.5)
nRBC: 0 % (ref 0.0–0.2)

## 2020-08-19 LAB — BASIC METABOLIC PANEL
Anion gap: 8 (ref 5–15)
BUN: 28 mg/dL — ABNORMAL HIGH (ref 8–23)
CO2: 31 mmol/L (ref 22–32)
Calcium: 8.7 mg/dL — ABNORMAL LOW (ref 8.9–10.3)
Chloride: 96 mmol/L — ABNORMAL LOW (ref 98–111)
Creatinine, Ser: 4.78 mg/dL — ABNORMAL HIGH (ref 0.61–1.24)
GFR, Estimated: 13 mL/min — ABNORMAL LOW (ref >60)
Glucose, Bld: 133 mg/dL — ABNORMAL HIGH (ref 70–99)
Potassium: 3.9 mmol/L (ref 3.5–5.1)
Sodium: 135 mmol/L (ref 135–145)

## 2020-08-19 LAB — POCT ACTIVATED CLOTTING TIME: Activated Clotting Time: 277 seconds

## 2020-08-19 LAB — GLUCOSE, CAPILLARY: Glucose-Capillary: 133 mg/dL — ABNORMAL HIGH (ref 70–99)

## 2020-08-19 SURGERY — CORONARY STENT INTERVENTION
Anesthesia: LOCAL

## 2020-08-19 MED ORDER — MIDAZOLAM HCL 2 MG/2ML IJ SOLN
INTRAMUSCULAR | Status: AC
  Filled 2020-08-19: qty 2

## 2020-08-19 MED ORDER — DARBEPOETIN ALFA 40 MCG/0.4ML IJ SOSY
40.0000 ug | PREFILLED_SYRINGE | INTRAMUSCULAR | Status: DC
Start: 2020-08-25 — End: 2020-08-20

## 2020-08-19 MED ORDER — SODIUM CHLORIDE 0.9 % IV SOLN
INTRAVENOUS | Status: DC | PRN
  Administered 2020-08-19: 0.25 mg/kg/h via INTRAVENOUS

## 2020-08-19 MED ORDER — FENTANYL CITRATE (PF) 100 MCG/2ML IJ SOLN
INTRAMUSCULAR | Status: AC
  Filled 2020-08-19: qty 2

## 2020-08-19 MED ORDER — CARVEDILOL 12.5 MG PO TABS
12.5000 mg | ORAL_TABLET | Freq: Two times a day (BID) | ORAL | Status: DC
  Administered 2020-08-19 – 2020-08-20 (×2): 12.5 mg via ORAL
  Filled 2020-08-19 (×2): qty 1

## 2020-08-19 MED ORDER — SODIUM CHLORIDE 0.9 % IV SOLN: INTRAVENOUS | Status: DC

## 2020-08-19 MED ORDER — GLIPIZIDE 5 MG PO TABS
2.5000 mg | ORAL_TABLET | Freq: Every day | ORAL | Status: DC
  Administered 2020-08-20: 2.5 mg via ORAL
  Filled 2020-08-19 (×2): qty 0.5
  Filled 2020-08-19: qty 1

## 2020-08-19 MED ORDER — NITROGLYCERIN 1 MG/10 ML FOR IR/CATH LAB
INTRA_ARTERIAL | Status: AC
  Filled 2020-08-19: qty 10

## 2020-08-19 MED ORDER — RENA-VITE PO TABS
1.0000 | ORAL_TABLET | Freq: Every day | ORAL | Status: DC
  Administered 2020-08-19 – 2020-08-20 (×2): 1 via ORAL
  Filled 2020-08-19 (×2): qty 1

## 2020-08-19 MED ORDER — CLOPIDOGREL BISULFATE 75 MG PO TABS
75.0000 mg | ORAL_TABLET | Freq: Every day | ORAL | Status: DC
  Administered 2020-08-19 – 2020-08-20 (×2): 75 mg via ORAL
  Filled 2020-08-19 (×2): qty 1

## 2020-08-19 MED ORDER — ASPIRIN 81 MG PO CHEW
81.0000 mg | CHEWABLE_TABLET | Freq: Every day | ORAL | Status: DC
  Administered 2020-08-20: 81 mg via ORAL
  Filled 2020-08-19: qty 1

## 2020-08-19 MED ORDER — DOXERCALCIFEROL 4 MCG/2ML IV SOLN
3.0000 ug | INTRAVENOUS | Status: DC
  Filled 2020-08-19: qty 2

## 2020-08-19 MED ORDER — MIDAZOLAM HCL 2 MG/2ML IJ SOLN
INTRAMUSCULAR | Status: DC | PRN
  Administered 2020-08-19: 1 mg via INTRAVENOUS

## 2020-08-19 MED ORDER — HEPARIN SODIUM (PORCINE) 5000 UNIT/ML IJ SOLN
5000.0000 [IU] | Freq: Three times a day (TID) | INTRAMUSCULAR | Status: DC
  Administered 2020-08-19 – 2020-08-20 (×2): 5000 [IU] via SUBCUTANEOUS
  Filled 2020-08-19 (×2): qty 1

## 2020-08-19 MED ORDER — ACETAMINOPHEN 325 MG PO TABS: 650.0000 mg | ORAL_TABLET | ORAL | Status: DC | PRN

## 2020-08-19 MED ORDER — ROSUVASTATIN CALCIUM 5 MG PO TABS
5.0000 mg | ORAL_TABLET | Freq: Every day | ORAL | Status: DC
  Administered 2020-08-19 – 2020-08-20 (×2): 5 mg via ORAL
  Filled 2020-08-19 (×2): qty 1

## 2020-08-19 MED ORDER — SODIUM CHLORIDE 0.9% FLUSH
3.0000 mL | Freq: Two times a day (BID) | INTRAVENOUS | Status: DC
  Administered 2020-08-19 (×2): 3 mL via INTRAVENOUS

## 2020-08-19 MED ORDER — HEPARIN SODIUM (PORCINE) 1000 UNIT/ML IJ SOLN
INTRAMUSCULAR | Status: AC
  Filled 2020-08-19: qty 1

## 2020-08-19 MED ORDER — SODIUM CHLORIDE 0.9% FLUSH: 3.0000 mL | INTRAVENOUS | Status: DC | PRN

## 2020-08-19 MED ORDER — LIDOCAINE HCL (PF) 1 % IJ SOLN
INTRAMUSCULAR | Status: DC | PRN
  Administered 2020-08-19: 5 mL via INTRADERMAL

## 2020-08-19 MED ORDER — ONDANSETRON HCL 4 MG/2ML IJ SOLN: 4.0000 mg | Freq: Four times a day (QID) | INTRAMUSCULAR | Status: DC | PRN

## 2020-08-19 MED ORDER — HEPARIN (PORCINE) IN NACL 1000-0.9 UT/500ML-% IV SOLN
INTRAVENOUS | Status: DC | PRN
  Administered 2020-08-19 (×3): 500 mL

## 2020-08-19 MED ORDER — BIVALIRUDIN TRIFLUOROACETATE 250 MG IV SOLR
INTRAVENOUS | Status: AC
  Filled 2020-08-19: qty 250

## 2020-08-19 MED ORDER — LIDOCAINE HCL (PF) 1 % IJ SOLN
INTRAMUSCULAR | Status: AC
  Filled 2020-08-19: qty 30

## 2020-08-19 MED ORDER — NITROGLYCERIN 1 MG/10 ML FOR IR/CATH LAB
INTRA_ARTERIAL | Status: DC | PRN
  Administered 2020-08-19: 200 ug via INTRACORONARY

## 2020-08-19 MED ORDER — ASPIRIN 81 MG PO CHEW: 81.0000 mg | CHEWABLE_TABLET | ORAL | Status: DC

## 2020-08-19 MED ORDER — SODIUM CHLORIDE 0.9 % IV SOLN: 250.0000 mL | INTRAVENOUS | Status: DC | PRN

## 2020-08-19 MED ORDER — SACUBITRIL-VALSARTAN 24-26 MG PO TABS
1.0000 | ORAL_TABLET | Freq: Two times a day (BID) | ORAL | Status: DC
  Administered 2020-08-19 – 2020-08-20 (×3): 1 via ORAL
  Filled 2020-08-19 (×3): qty 1

## 2020-08-19 MED ORDER — HEPARIN (PORCINE) IN NACL 1000-0.9 UT/500ML-% IV SOLN
INTRAVENOUS | Status: AC
  Filled 2020-08-19: qty 1000

## 2020-08-19 MED ORDER — BIVALIRUDIN BOLUS VIA INFUSION - CUPID
INTRAVENOUS | Status: DC | PRN
  Administered 2020-08-19: 22.32 mg via INTRAVENOUS

## 2020-08-19 MED ORDER — VERAPAMIL HCL 2.5 MG/ML IV SOLN
INTRAVENOUS | Status: AC
  Filled 2020-08-19: qty 2

## 2020-08-19 MED ORDER — FENTANYL CITRATE (PF) 100 MCG/2ML IJ SOLN
INTRAMUSCULAR | Status: DC | PRN
  Administered 2020-08-19: 25 ug via INTRAVENOUS

## 2020-08-19 MED ORDER — CLOPIDOGREL BISULFATE 75 MG PO TABS: 75.0000 mg | ORAL_TABLET | ORAL | Status: DC

## 2020-08-19 MED ORDER — CALCIUM ACETATE (PHOS BINDER) 667 MG PO CAPS
667.0000 mg | ORAL_CAPSULE | Freq: Three times a day (TID) | ORAL | Status: DC
  Administered 2020-08-19 – 2020-08-20 (×3): 667 mg via ORAL
  Filled 2020-08-19 (×3): qty 1

## 2020-08-19 SURGICAL SUPPLY — 16 items
BALLN EUPHORA RX 2.25X12 (BALLOONS) ×2
BALLN SAPPHIRE ~~LOC~~ 3.25X15 (BALLOONS) ×1 IMPLANT
BALLN SAPPHIRE ~~LOC~~ 3.5X15 (BALLOONS) ×1 IMPLANT
BALLOON EUPHORA RX 2.25X12 (BALLOONS) IMPLANT
CATH DRAGONFLY OPSTAR (CATHETERS) ×1 IMPLANT
CATH VISTA GUIDE 6FR XBLAD4 (CATHETERS) ×1 IMPLANT
GLIDESHEATH SLEND SS 6F .021 (SHEATH) IMPLANT
KIT ENCORE 26 ADVANTAGE (KITS) ×1 IMPLANT
KIT HEART LEFT (KITS) ×2 IMPLANT
PACK CARDIAC CATHETERIZATION (CUSTOM PROCEDURE TRAY) ×2 IMPLANT
SHEATH PINNACLE 6F 10CM (SHEATH) ×1 IMPLANT
STENT ONYX FRONTIER 3.0X30 (Permanent Stent) ×1 IMPLANT
TRANSDUCER W/STOPCOCK (MISCELLANEOUS) ×2 IMPLANT
TUBING CIL FLEX 10 FLL-RA (TUBING) ×2 IMPLANT
WIRE ASAHI PROWATER 180CM (WIRE) ×1 IMPLANT
WIRE EMERALD 3MM-J .035X150CM (WIRE) ×1 IMPLANT

## 2020-08-19 NOTE — Progress Notes (Signed)
Site area: rt groin fa sheath Site Prior to Removal:  Level 0 Pressure Applied For: 50 minutes Manual:   yes Patient Status During Pull:  stable Post Pull Site:  Level 0 Post Pull Instructions Given:  yes Post Pull Pulses Present: rt dp palpable Dressing Applied:  gauze and tegaderm Bedrest begins @ 1250 Comments:

## 2020-08-19 NOTE — Interval H&P Note (Signed)
History and Physical Interval Note:  08/19/2020 8:30 AM  Kyle [name]  has presented today for surgery, with the diagnosis of cad.  The various methods of treatment have been discussed with the patient and family. After consideration of risks, benefits and other options for treatment, the patient has consented to  Procedure(s): CORONARY STENT INTERVENTION (N/A) as a surgical intervention.  [name] history has been reviewed, patient examined, no change in status, stable for surgery.  I have reviewed the patient's chart and labs.  Questions were answered to the patient's satisfaction.   Cath Lab Visit (complete for each Cath Lab visit)  Clinical Evaluation Leading to the Procedure:   ACS: No.  Non-ACS:    Anginal Classification: CCS II  Anti-ischemic medical therapy: Minimal Therapy (1 class of medications)  Non-Invasive Test Results: High-risk stress test findings: cardiac mortality >3%/year  Prior CABG: No previous CABG        [name] 08/19/2020 8:30 AM

## 2020-08-19 NOTE — Discharge Instructions (Signed)

## 2020-08-19 NOTE — Progress Notes (Signed)
1200  160/82  78  20RR 96%; 1215  155/88  78  21  96%;

## 2020-08-20 DIAGNOSIS — Z992 Dependence on renal dialysis: Secondary | ICD-10-CM

## 2020-08-20 DIAGNOSIS — I2510 Atherosclerotic heart disease of native coronary artery without angina pectoris: Secondary | ICD-10-CM

## 2020-08-20 DIAGNOSIS — I5043 Acute on chronic combined systolic (congestive) and diastolic (congestive) heart failure: Secondary | ICD-10-CM

## 2020-08-20 DIAGNOSIS — R9439 Abnormal result of other cardiovascular function study: Secondary | ICD-10-CM

## 2020-08-20 DIAGNOSIS — I132 Hypertensive heart and chronic kidney disease with heart failure and with stage 5 chronic kidney disease, or end stage renal disease: Secondary | ICD-10-CM | POA: Diagnosis not present

## 2020-08-20 DIAGNOSIS — N186 End stage renal disease: Secondary | ICD-10-CM

## 2020-08-20 LAB — CBC
HCT: 33.5 % — ABNORMAL LOW (ref 39.0–52.0)
Hemoglobin: 11.2 g/dL — ABNORMAL LOW (ref 13.0–17.0)
MCH: 31.3 pg (ref 26.0–34.0)
MCHC: 33.4 g/dL (ref 30.0–36.0)
MCV: 93.6 fL (ref 80.0–100.0)
Platelets: 189 10*3/uL (ref 150–400)
RBC: 3.58 MIL/uL — ABNORMAL LOW (ref 4.22–5.81)
RDW: 17.6 % — ABNORMAL HIGH (ref 11.5–15.5)
WBC: 6.8 10*3/uL (ref 4.0–10.5)
nRBC: 0 % (ref 0.0–0.2)

## 2020-08-20 LAB — BASIC METABOLIC PANEL
Anion gap: 11 (ref 5–15)
BUN: 36 mg/dL — ABNORMAL HIGH (ref 8–23)
CO2: 27 mmol/L (ref 22–32)
Calcium: 8.8 mg/dL — ABNORMAL LOW (ref 8.9–10.3)
Chloride: 95 mmol/L — ABNORMAL LOW (ref 98–111)
Creatinine, Ser: 5.96 mg/dL — ABNORMAL HIGH (ref 0.61–1.24)
GFR, Estimated: 10 mL/min — ABNORMAL LOW (ref >60)
Glucose, Bld: 211 mg/dL — ABNORMAL HIGH (ref 70–99)
Potassium: 4.2 mmol/L (ref 3.5–5.1)
Sodium: 133 mmol/L — ABNORMAL LOW (ref 135–145)

## 2020-08-20 LAB — GLUCOSE, CAPILLARY: Glucose-Capillary: 261 mg/dL — ABNORMAL HIGH (ref 70–99)

## 2020-08-20 MED FILL — Verapamil HCl IV Soln 2.5 MG/ML: INTRAVENOUS | Qty: 2 | Status: AC

## 2020-08-20 NOTE — Discharge Summary (Addendum)
The patient has been seen in conjunction with Reino Bellis, NP-C. All aspects of care have been considered and discussed. The patient has been personally interviewed, examined, and all clinical data has been reviewed.  The results have been reviewed.  Right femoral cath site unremarkable. Images of OCT guided LAD stent reviewed. Very nice result. Dialysis scheduled got tomorrow. Ready for DC.  Discharge Summary    Patient ID: Kyle [name] MRN: [id number]; DOB: 02/23/1955  Admit date: 08/19/2020 Discharge date: 08/20/2020  PCP:  Patient, No Pcp Per (Inactive)   [facility] Providers Cardiologist:  None  Advanced Heart Failure:  Loralie Champagne, MD  {  Discharge Diagnoses    Principal Problem:   Acute on chronic combined systolic and diastolic CHF, NYHA class 2 ([place]) Active Problems:   Benign essential HTN   DM (diabetes mellitus), type 2, uncontrolled, with renal complications ([place])   ESRD (end stage renal disease) on dialysis ([place])   Abnormal nuclear stress test   Acute on chronic combined systolic and diastolic CHF (congestive heart failure) ([place])  Diagnostic Studies/Procedures    Cath: 08/19/20  Prox LAD-1 lesion is 50% stenosed.   Prox LAD-2 lesion is 60% stenosed.   Prox LAD to Mid LAD lesion is 90% stenosed.   A drug-eluting stent was successfully placed using a STENT ONYX FRONTIER 3.0X30.   Post intervention, there is a 0% residual stenosis.   Post intervention, there is a 0% residual stenosis.   Successful PCI of the mid LAD with OCT guidance and DES x 1.   Plan: observe overnight. Continue DAPT for at least 6 months. Anticipate DC in am.   Diagnostic Dominance: Left    Intervention    _____________   History of Present Illness     Kyle [name] is a 65 y.o. male with history of nonischemic cardiomyopathy and chronic systolic CHF, CAD, and ESRD presents for evaluation of CHF.  Patient has a long history of nonischemic cardiomyopathy.  Echo  back in 12/15 showed EF 15%. LV function has gone up and down over the years.  Echoes in 2018 and 2019 showed normal EF, but more recently, echo in 11/21 showed EF back down to 30-35% with EF 25% on Cardiolite.  Caths in 8/16 and 8/18 showed moderate stenosis in proximal non-dominant RCA, medical management.  Patient additionally has ESRD and has been interested in getting a renal transplant, but this effort is impeded by low LF systolic function.   Echo was done and reviewed, EF 35% with diffuse hypokinesis, small pericardial effusion, moderate RV dilation with moderately decreased RV systolic function, PASP 64 mmHg, dilated IVC.   Patient returned for followup of CHF on 08/07/20.  He was very short of breath walking up stairs or walking about 100 feet.  No orthopnea/PND.  No lightheadedness.  He has tolerated Entresto without any episodes of hypotension with HD.  Weight is down 4 lbs. With the decline in his EF it was recommended that he undergo cardiac cath. Cath done on 7/19 showed pLAD lesion of 90%. Films were reviewed with Dr. Martinique with recommendations for loading with plavix, with several additional HD sessions to improve right heart filling pressures.   Hospital Course     Underwent cardiac cath noted above with Dr. Martinique with successful PCI of the mLAD with OCT guidance and DESx1. No complications noted overnight. Was able to ambulate with cardiac rehab without chest pain or dyspnea. Recommendations for DAPT with ASA/plavix for at least 6 months.  Normally does HD on WMF, talked with his HD center who will reschedule him for a session tomorrow ( Thursday) to make up for lost session while in the hospital. No significant changes made to home medications.   General: Well developed, well nourished, male appearing in no acute distress. Head: Normocephalic, atraumatic.  Neck: Supple without bruits, JVD. Lungs:  Resp regular and unlabored, CTA. Heart: RRR, S1, S2, no S3, S4, or murmur; no  rub. Abdomen: Soft, non-tender, non-distended with normoactive bowel sounds. No hepatomegaly. No rebound/guarding. No obvious abdominal masses. Extremities: No clubbing, cyanosis, edema. Distal pedal pulses are 2+ bilaterally. Right femoral cath site stable without bruising or hematoma Neuro: Alert and oriented X 3. Moves all extremities spontaneously. Psych: Normal affect.  Patient was seen by Dr. Tamala Julian and deemed stable for discharge home. Follow up in the office has been arranged.   Did the patient have an acute coronary syndrome (MI, NSTEMI, STEMI, etc) this admission?:  No                               Did the patient have a percutaneous coronary intervention (stent / angioplasty)?:  Yes.     Cath/PCI Registry Performance & Quality Measures: Aspirin prescribed? - Yes ADP Receptor Inhibitor (Plavix/Clopidogrel, Brilinta/Ticagrelor or Effient/Prasugrel) prescribed (includes medically managed patients)? - Yes High Intensity Statin (Lipitor 40-'80mg'$  or Crestor 20-'40mg'$ ) prescribed? - No - has been on Crestor '5mg'$  daily, tolerating well. Will defer further titration to PCP cardiologist. For EF <40%, was ACEI/ARB prescribed? - Yes For EF <40%, Aldosterone Antagonist (Spironolactone or Eplerenone) prescribed? - No - Reason:  ESRD Cardiac Rehab Phase II ordered? - Yes      _____________  Discharge Vitals Blood pressure (!) 149/79, pulse 81, temperature 98 F (36.7 C), temperature source Kyle, resp. rate 18, height 6' (1.829 m), weight 74.4 kg, SpO2 98 %.  Filed Weights   08/19/20 0722  Weight: 74.4 kg    Labs & Radiologic Studies    CBC Recent Labs    08/19/20 0733 08/20/20 0121  WBC 6.9 6.8  HGB 11.4* 11.2*  HCT 34.3* 33.5*  MCV 94.5 93.6  PLT 211 [id number]   Basic Metabolic Panel Recent Labs    08/19/20 0733 08/20/20 0121  NA 135 133*  K 3.9 4.2  CL 96* 95*  CO2 31 27  GLUCOSE 133* 211*  BUN 28* 36*  CREATININE 4.78* 5.96*  CALCIUM 8.7* 8.8*   Liver Function Tests No  results for input(s): AST, ALT, ALKPHOS, BILITOT, PROT, ALBUMIN in the last 72 hours. No results for input(s): LIPASE, AMYLASE in the last 72 hours. High Sensitivity Troponin:   No results for input(s): TROPONINIHS in the last 720 hours.  BNP Invalid input(s): POCBNP D-Dimer No results for input(s): DDIMER in the last 72 hours. Hemoglobin A1C No results for input(s): HGBA1C in the last 72 hours. Fasting Lipid Panel No results for input(s): CHOL, HDL, LDLCALC, TRIG, CHOLHDL, LDLDIRECT in the last 72 hours. Thyroid Function Tests No results for input(s): TSH, T4TOTAL, T3FREE, THYROIDAB in the last 72 hours.  Invalid input(s): FREET3 _____________  CARDIAC CATHETERIZATION  Result Date: 08/19/2020 Formatting of this result is different from the original.   Prox LAD-1 lesion is 50% stenosed.   Prox LAD-2 lesion is 60% stenosed.   Prox LAD to Mid LAD lesion is 90% stenosed.   A drug-eluting stent was successfully placed using a STENT ONYX FRONTIER 3.0X30.  Post intervention, there is a 0% residual stenosis.   Post intervention, there is a 0% residual stenosis. Successful PCI of the mid LAD with OCT guidance and DES x 1. Plan: observe overnight. Continue DAPT for at least 6 months. Anticipate DC in am.   CARDIAC CATHETERIZATION  Result Date: 08/12/2020 Formatting of this result is different from the original.   Prox RCA lesion is 80% stenosed.   Prox LAD-1 lesion is 50% stenosed.   Prox LAD-2 lesion is 60% stenosed.   Prox LAD to Mid LAD lesion is 90% stenosed.   1st Sept lesion is 50% stenosed.   Dist LM lesion is 40% stenosed. 1. Elevated right-sided filling pressures. 2. Normal PCWP 3. Moderate pulmonary arterial hypertension. 4. There is a long segment of the proximal LAD with disease, the worst stenosis reaching 90% just distal to the take-off of a large septal perforator, this segment of the LAD takes a significant superior turn.  5. 40% distal left main stenosis. 6. 80% proximal stenosis of  small, nondominant RCA. With significant fall in EF and inferior ischemia on last Cardiolite (wrap-around LAD supplies a significant part of the inferior wall), I think that we need to intervene on the LAD.  The lesion is somewhat complex, long segment with disease and significant superior angulation of the tightest point just after a large septal perforator.  I reviewed with Dr. Martinique, will start patient on Plavix and have him get a couple of dialysis sessions in to improve right heart filling pressures.  Will bring him back for complex LAD intervention in 1 week.  He has surgery scheduled for an elective inguinal hernia repair.  This is not incarcerated and only causes pain intermittently.  I think we need to proceed with coronary intervention.  Ideally, he will hold off on surgery for 6 months post-PCI, then can hold Plavix and operate on hernia.  Discussed with patient and wife. Moderate pulmonary arterial hypertension.  He will need sleep study and should also have workup with V/Q scan to rule out chronic PEs (not likely).   ECHOCARDIOGRAM COMPLETE  Result Date: 08/07/2020    ECHOCARDIOGRAM REPORT   Patient Name:   Kyle [name] Date of Exam: 08/07/2020 Medical Rec #:  [id number]       Height:       72.0 in Accession #:    [id number]      Weight:       168.2 lb Date of Birth:  [birth year]       BSA:          1.979 m Patient Age:    75 years        BP:           134/63 mmHg Patient Gender: M               HR:           86 bpm. Exam Location:  Outpatient Procedure: 2D Echo, 3D Echo, Color Doppler, Cardiac Doppler and Strain Analysis Indications:    I50.42 Chronic combined systolic (congestive) and diastolic                 (congestive) heart failure  History:        Patient has prior history of Echocardiogram examinations, most                 recent 08/30/2015. Cardiomyopathy, CAD, Signs/Symptoms:Murmur;  Risk Factors:Hypertension, Diabetes and Dyslipidemia. ESRD.                 Cardiac Arrest.  Bacteremia.  Sonographer:    Jonelle Sidle Dance Referring Phys: [place]  1. Left ventricular ejection fraction, by estimation, is 35%. The left ventricle has moderately decreased function. The left ventricle demonstrates global hypokinesis. There is mild left ventricular hypertrophy. Left ventricular diastolic parameters are  consistent with Grade I diastolic dysfunction (impaired relaxation).  2. Right ventricular systolic function is moderately reduced. The right ventricular size is moderately enlarged. There is severely elevated pulmonary artery systolic pressure. The estimated right ventricular systolic pressure is [id number] mmHg.  3. Right atrial size was moderately dilated.  4. A small pericardial effusion is present.  5. The mitral valve is normal in structure. Trivial mitral valve regurgitation. No evidence of mitral stenosis.  6. Tricuspid valve regurgitation is moderate.  7. The aortic valve is tricuspid. Aortic valve regurgitation is trivial. No aortic stenosis is present.  8. The inferior vena cava is dilated in size with <50% respiratory variability, suggesting right atrial pressure of 15 mmHg. FINDINGS  Left Ventricle: Left ventricular ejection fraction, by estimation, is 35%. The left ventricle has moderately decreased function. The left ventricle demonstrates global hypokinesis. The left ventricular internal cavity size was normal in size. There is mild left ventricular hypertrophy. Left ventricular diastolic parameters are consistent with Grade I diastolic dysfunction (impaired relaxation). Right Ventricle: The right ventricular size is moderately enlarged. No increase in right ventricular wall thickness. Right ventricular systolic function is moderately reduced. There is severely elevated pulmonary artery systolic pressure. The tricuspid regurgitant velocity is 3.49 m/s, and with an assumed right atrial pressure of 15 mmHg, the estimated right ventricular systolic pressure is [id number]  mmHg. Left Atrium: Left atrial size was normal in size. Right Atrium: Right atrial size was moderately dilated. Pericardium: A small pericardial effusion is present. Mitral Valve: The mitral valve is normal in structure. Trivial mitral valve regurgitation. No evidence of mitral valve stenosis. Tricuspid Valve: The tricuspid valve is normal in structure. Tricuspid valve regurgitation is moderate. Aortic Valve: The aortic valve is tricuspid. Aortic valve regurgitation is trivial. No aortic stenosis is present. Pulmonic Valve: The pulmonic valve was normal in structure. Pulmonic valve regurgitation is trivial. Aorta: The aortic root is normal in size and structure. Venous: The inferior vena cava is dilated in size with less than 50% respiratory variability, suggesting right atrial pressure of 15 mmHg. IAS/Shunts: There is left bowing of the interatrial septum, suggestive of elevated right atrial pressure. No atrial level shunt detected by color flow Doppler.  LEFT VENTRICLE PLAX 2D LVIDd:         5.00 cm     Diastology LVIDs:         4.10 cm     LV e' medial:    5.55 cm/s LV PW:         1.10 cm     LV E/e' medial:  11.0 LV IVS:        1.10 cm     LV e' lateral:   7.29 cm/s LVOT diam:     1.90 cm     LV E/e' lateral: 8.4 LV SV:         41 LV SV Index:   20 LVOT Area:     2.84 cm  LV Volumes (MOD) LV vol d, MOD A2C: 92.3 ml LV vol d, MOD A4C: 61.0 ml LV vol s,  MOD A2C: 43.0 ml LV vol s, MOD A4C: 30.6 ml LV SV MOD A2C:     49.3 ml LV SV MOD A4C:     61.0 ml LV SV MOD BP:      39.5 ml RIGHT VENTRICLE            IVC RV Basal diam:  5.00 cm    IVC diam: 2.50 cm RV Mid diam:    3.30 cm RV S prime:     9.14 cm/s TAPSE (M-mode): 1.1 cm LEFT ATRIUM             Index       RIGHT ATRIUM           Index LA diam:        4.30 cm 2.17 cm/m  RA Area:     29.10 cm LA Vol (A2C):   51.6 ml 26.07 ml/m RA Volume:   122.00 ml 61.64 ml/m LA Vol (A4C):   49.2 ml 24.86 ml/m LA Biplane Vol: 52.3 ml 26.42 ml/m  AORTIC VALVE LVOT Vmax:    84.35 cm/s LVOT Vmean:  52.700 cm/s LVOT VTI:    0.143 m  AORTA Ao Root diam: 2.90 cm Ao Asc diam:  3.40 cm MITRAL VALVE               TRICUSPID VALVE MV Area (PHT): 5.02 cm    TR Peak grad:   48.7 mmHg MV Decel Time: 151 msec    TR Vmax:        349.00 cm/s MV E velocity: 61.30 cm/s MV A velocity: 96.90 cm/s  SHUNTS MV E/A ratio:  0.63        Systemic VTI:  0.14 m                            Systemic Diam: 1.90 cm Loralie Champagne MD Electronically signed by Loralie Champagne MD Signature Date/Time: 08/07/2020/12:32:33 PM    Final    Disposition   Pt is being discharged home today in good condition.  Follow-up Plans & Appointments     Follow-up Information     [facility] Follow up on 09/02/2020.   Specialty: Cardiology Why: at 12pm for your follow up appt. Contact information: [address] [id number] [place] [phone]               Discharge Instructions     Amb Referral to Cardiac Rehabilitation   Complete by: As directed    Will send Cardiac Rehab Phase 2 referral to [facility]   Diagnosis: Coronary Stents   After initial evaluation and assessments completed: Virtual Based Care may be provided alone or in conjunction with Phase 2 Cardiac Rehab based on patient barriers.: Yes   Call MD for:  difficulty breathing, headache or visual disturbances   Complete by: As directed    Call MD for:  redness, tenderness, or signs of infection (pain, swelling, redness, odor or green/yellow discharge around incision site)   Complete by: As directed    Diet - low sodium heart healthy   Complete by: As directed    Discharge instructions   Complete by: As directed    Groin Site Care Refer to this sheet in the next few weeks. These instructions provide you with information on caring for yourself after your procedure. Your caregiver may also give you more specific instructions. Your treatment has been  planned according  to current medical practices, but problems sometimes occur. Call your caregiver if you have any problems or questions after your procedure. HOME CARE INSTRUCTIONS You may shower 24 hours after the procedure. Remove the bandage (dressing) and gently wash the site with plain soap and water. Gently pat the site dry.  Do not apply powder or lotion to the site.  Do not sit in a bathtub, swimming pool, or whirlpool for 5 to 7 days.  No bending, squatting, or lifting anything over 10 pounds (4.5 kg) as directed by your caregiver.  Inspect the site at least twice daily.  Do not drive home if you are discharged the same day of the procedure. Have someone else drive you.  You may drive 24 hours after the procedure unless otherwise instructed by your caregiver.  What to expect: Any bruising will usually fade within 1 to 2 weeks.  Blood that collects in the tissue (hematoma) may be painful to the touch. It should usually decrease in size and tenderness within 1 to 2 weeks.  SEEK IMMEDIATE MEDICAL CARE IF: You have unusual pain at the groin site or down the affected leg.  You have redness, warmth, swelling, or pain at the groin site.  You have drainage (other than a small amount of blood on the dressing).  You have chills.  You have a fever or persistent symptoms for more than 72 hours.  You have a fever and your symptoms suddenly get worse.  Your leg becomes pale, cool, tingly, or numb.  You have heavy bleeding from the site. Hold pressure on the site. Marland Kitchen  PLEASE DO NOT MISS ANY DOSES OF YOUR PLAVIX!!!!! Also keep a log of you blood pressures and bring back to your follow up appt. Please call the office with any questions.   Patients taking blood thinners should generally stay away from medicines like ibuprofen, Advil, Motrin, naproxen, and Aleve due to risk of stomach bleeding. You may take Tylenol as directed or talk to your primary doctor about alternatives.  PLEASE ENSURE THAT YOU DO NOT RUN OUT OF  YOUR PLAVIX. This medication is very important to remain on for at least 6 months. IF you have issues obtaining this medication due to cost please CALL the office 3-5 business days prior to running out in order to prevent missing doses of this medication.   Increase activity slowly   Complete by: As directed        Discharge Medications   Allergies as of 08/20/2020   No Known Allergies      Medication List     TAKE these medications    acetaminophen 325 MG tablet Commonly known as: TYLENOL Take 2 tablets (650 mg total) by mouth every 6 (six) hours as needed for mild pain (or Fever >/= 101).   aspirin 81 MG tablet Take 81 mg by mouth daily.   calcium acetate 667 MG capsule Commonly known as: PHOSLO Take 667 mg by mouth 3 (three) times daily with meals.   carvedilol 12.5 MG tablet Commonly known as: COREG Take 1 tablet (12.5 mg total) by mouth 2 (two) times daily with a meal.   clopidogrel 75 MG tablet Commonly known as: Plavix Take 1 tablet (75 mg total) by mouth daily. What changed: Another medication with the same name was removed. Continue taking this medication, and follow the directions you see here.   Darbepoetin Alfa 40 MCG/0.4ML Sosy injection Commonly known as: ARANESP Inject 0.4 mLs (40 mcg total) into  the vein every Monday with hemodialysis.   doxercalciferol 4 MCG/2ML injection Commonly known as: HECTOROL Inject 1.5 mLs (3 mcg total) into the vein every Monday, Wednesday, and Friday with hemodialysis.   Entresto 24-26 MG Generic drug: sacubitril-valsartan Take 1 tablet by mouth 2 (two) times daily. What changed: Another medication with the same name was removed. Continue taking this medication, and follow the directions you see here.   glipiZIDE 5 MG tablet Commonly known as: Glucotrol Take 0.5 tablets (2.5 mg total) by mouth daily before breakfast.   heparin 1000 unit/mL Soln injection 1.5 mLs (1,500 Units total) by Dialysis route as needed (in  dialysis).   nitroGLYCERIN 0.4 MG SL tablet Commonly known as: NITROSTAT Place 1 tablet (0.4 mg total) under the tongue every 5 (five) minutes as needed.   Rena-Vite Rx 1 MG Tabs Take 1 tablet by mouth daily.   rosuvastatin 5 MG tablet Commonly known as: CRESTOR Take 5 mg by mouth daily.        Outstanding Labs/Studies   N/a   Duration of Discharge Encounter   Greater than 30 minutes including physician time.  Signed, Reino Bellis, NP 08/20/2020, 11:14 AM

## 2020-08-20 NOTE — Progress Notes (Signed)
Discharge instructions (including medications) discussed with and copy provided to patient/caregiver 

## 2020-08-20 NOTE — Plan of Care (Signed)
  Problem: Education: Goal: Knowledge of General Education information will improve Description: Including pain rating scale, medication(s)/side effects and non-pharmacologic comfort measures Outcome: Adequate for Discharge   

## 2020-08-20 NOTE — Progress Notes (Signed)
No symptoms of angina. A.m. labs are stable. Cath site is unremarkable. IVUS guided LAD stent with excellent result.  Images were personally reviewed. Patient is ready for discharge.

## 2020-08-20 NOTE — Progress Notes (Signed)
CARDIAC REHAB PHASE I   Offered to walk with pt, pt states independent ambulation around the room, feels like he got his "battery charged". Pt educated on importance of ASA and Plavix. Pt given stent card along with heart healthy and diabetic diets. Reviewed site care, restrictions, and exercise guidelines. Will refer to CRP II [facility].  [id number] Rufina Falco, RN BSN 08/20/2020 8:35 AM

## 2020-08-21 ENCOUNTER — Telehealth: Payer: Self-pay | Admitting: Nephrology

## 2020-08-21 NOTE — Telephone Encounter (Signed)
Transition of care contact from inpatient facility  Date of Discharge: 08/20/20 Date of Contact: 08/21/20 Method of contact: Phone -attempt   Attempted to contact patient to discuss transition of care from inpatient admission. Patient did not answer the phone. Message was left on the patient's voicemail with call back number [phone].

## 2020-08-22 ENCOUNTER — Telehealth (HOSPITAL_COMMUNITY): Payer: Self-pay

## 2020-08-22 NOTE — Telephone Encounter (Signed)
Per phase I cardiac rehab, fax cardiac rehab referral to [facility]. 

## 2020-09-02 ENCOUNTER — Encounter (HOSPITAL_COMMUNITY): Payer: Medicare (Managed Care)

## 2020-12-22 ENCOUNTER — Other Ambulatory Visit (HOSPITAL_COMMUNITY): Payer: Self-pay

## 2020-12-22 MED ORDER — ENTRESTO 24-26 MG PO TABS: 1.0000 | ORAL_TABLET | Freq: Two times a day (BID) | ORAL | 0 refills | Status: DC

## 2020-12-23 ENCOUNTER — Telehealth: Payer: Self-pay

## 2020-12-23 ENCOUNTER — Encounter: Payer: Self-pay | Admitting: Student

## 2020-12-23 NOTE — Progress Notes (Unsigned)
Called pt but number is not working. Pt only has 1 number in the system.

## 2020-12-23 NOTE — Progress Notes (Unsigned)
   I have never seen this patient but received a non-adherence therapy advisory from [facility] that patient has not been taking Plavix (or at least has not been refilling his Plavix). Per chart review, patient had a stent placed to his LAD on 08/19/2020 and uninterrupted DAPT with Aspirin and Plavix was recommended for a least 6 months. Patient also has not been seen in the office since his PCI. Looks like he follows with Dr. Geraldo Pitter and Dr. Aundra Dubin.   Will route this note to triage pool in [facility]. Triage, can we please contact patient and make sure he is taking Plavix? Can we also please arrange a follow-up visit with Dr. Geraldo Pitter?  Thank you! Beverley Allender

## 2020-12-23 NOTE — Telephone Encounter (Signed)
Tried calling patient. No answer and no voicemail set up for me to leave a message. 

## 2020-12-24 NOTE — Progress Notes (Unsigned)
Called number in chart today but no answer or VM.

## 2020-12-27 ENCOUNTER — Other Ambulatory Visit: Payer: Self-pay | Admitting: Student

## 2020-12-29 NOTE — Telephone Encounter (Signed)
This is a CHF pt 

## 2020-12-31 NOTE — Progress Notes (Unsigned)
Number is not a working number.

## 2021-02-04 ENCOUNTER — Telehealth: Payer: Self-pay | Admitting: *Deleted

## 2021-02-04 NOTE — Telephone Encounter (Signed)
° °  Pre-operative Risk Assessment    Patient Name: Kyle [name]  DOB: [birth year] MRN: [id number]      Request for Surgical Clearance    Procedure:   LEFT INGUINAL HERNIA REPAIR ; LAPAROSCOPIC  Date of Surgery:  Clearance 03/09/21                                 Surgeon:  DR. Samuel Germany Surgeon's Group or Practice Name:  [facility] Phone number:  [phone] Fax number:  [phone]   Type of Clearance Requested:   - Medical  - Pharmacy:  Hold Aspirin and Clopidogrel (Plavix)     Type of Anesthesia:  General    Additional requests/questions:    Jiles Prows   02/04/2021, 9:05 AM

## 2021-02-04 NOTE — Telephone Encounter (Signed)
Tried to reach the pt to advise that he is going to need an appt for pre op clearance. No answer and no vm. I will send FYI to requesting office in hopes if they s/w the pt that they direct him to call his cardiologist office as he will need an appt.

## 2021-02-04 NOTE — Telephone Encounter (Signed)
° °  Name: Kyle [name]  DOB: 1956/01/25  MRN: [id number]  Primary Cardiologist: Dr. Geraldo Pitter / Dr. Aundra Dubin  Chart reviewed as part of pre-operative protocol coverage. Because of Kyle [name]'s past medical history and time since last visit, he will require a follow-up visit in order to better assess preoperative cardiovascular risk.  Pre-op covering staff: - Please schedule appointment and call patient to inform them. If patient already had an upcoming appointment within acceptable timeframe, please add "pre-op clearance" to the appointment notes so provider is aware. - Please contact requesting surgeon's office via preferred method (i.e, phone, fax) to inform them of need for appointment prior to surgery.  If applicable, this message will also be routed to pharmacy pool and/or primary cardiologist for input on holding anticoagulant/antiplatelet agent as requested below so that this information is available to the clearing provider at time of patient's appointment.   Patient has not had any follow up since stent placement in LAD in July 2022. There is a phone note from Nov 2022 by [facility] that suggest patient likely has not been compliant with plavix therapy or at least has not picked up the plavix medication we sent to his pharmacy. He needs to demonstrate medical compliance and be seen in the cardiology office before we can clear him.   Almyra Deforest, [place]  02/04/2021, 10:33 AM

## 2021-02-05 NOTE — Telephone Encounter (Signed)
Tried to call the pt again to schedule an appt for pre op clearance. No answer and no vm came on.

## 2021-02-06 NOTE — Telephone Encounter (Signed)
3rd attempt to reach pt regarding surgical clearance and the need for an appointment.  Will close this encounter and route back to the requesting surgeon's office, hopefully they can reach the pt and have him contact the Cardiologist office to schedule an appointment for surgical clearance.

## 2021-02-18 ENCOUNTER — Telehealth (HOSPITAL_COMMUNITY): Payer: Self-pay

## 2021-02-18 NOTE — Telephone Encounter (Signed)
Called and was unable to leave a voicemail to confirm/remind patient of their appointment at the [facility] on 02/19/21.

## 2021-02-19 ENCOUNTER — Ambulatory Visit (HOSPITAL_COMMUNITY)
Admission: RE | Admit: 2021-02-19 | Discharge: 2021-02-19 | Disposition: A | Discharge status: Home / Self Care | Payer: Medicare (Managed Care) | Source: Ambulatory Visit | Attending: Adult Health | Admitting: Adult Health

## 2021-02-19 ENCOUNTER — Other Ambulatory Visit: Payer: Self-pay

## 2021-02-19 ENCOUNTER — Encounter (HOSPITAL_COMMUNITY): Payer: Self-pay

## 2021-02-19 VITALS — BP 110/68 | HR 78 | Wt 163.4 lb

## 2021-02-19 DIAGNOSIS — Z992 Dependence on renal dialysis: Secondary | ICD-10-CM

## 2021-02-19 DIAGNOSIS — I132 Hypertensive heart and chronic kidney disease with heart failure and with stage 5 chronic kidney disease, or end stage renal disease: Secondary | ICD-10-CM | POA: Diagnosis not present

## 2021-02-19 DIAGNOSIS — I2510 Atherosclerotic heart disease of native coronary artery without angina pectoris: Secondary | ICD-10-CM

## 2021-02-19 DIAGNOSIS — E1122 Type 2 diabetes mellitus with diabetic chronic kidney disease: Secondary | ICD-10-CM | POA: Insufficient documentation

## 2021-02-19 DIAGNOSIS — I10 Essential (primary) hypertension: Secondary | ICD-10-CM | POA: Diagnosis not present

## 2021-02-19 DIAGNOSIS — I5022 Chronic systolic (congestive) heart failure: Secondary | ICD-10-CM | POA: Diagnosis not present

## 2021-02-19 DIAGNOSIS — Z79899 Other long term (current) drug therapy: Secondary | ICD-10-CM | POA: Insufficient documentation

## 2021-02-19 DIAGNOSIS — Z7982 Long term (current) use of aspirin: Secondary | ICD-10-CM | POA: Insufficient documentation

## 2021-02-19 DIAGNOSIS — Z955 Presence of coronary angioplasty implant and graft: Secondary | ICD-10-CM | POA: Insufficient documentation

## 2021-02-19 DIAGNOSIS — I428 Other cardiomyopathies: Secondary | ICD-10-CM | POA: Insufficient documentation

## 2021-02-19 DIAGNOSIS — N186 End stage renal disease: Secondary | ICD-10-CM | POA: Diagnosis not present

## 2021-02-19 LAB — BASIC METABOLIC PANEL
Anion gap: 10 (ref 5–15)
BUN: 31 mg/dL — ABNORMAL HIGH (ref 8–23)
CO2: 32 mmol/L (ref 22–32)
Calcium: 8.9 mg/dL (ref 8.9–10.3)
Chloride: 94 mmol/L — ABNORMAL LOW (ref 98–111)
Creatinine, Ser: 5.18 mg/dL — ABNORMAL HIGH (ref 0.61–1.24)
GFR, Estimated: 12 mL/min — ABNORMAL LOW (ref >60)
Glucose, Bld: 228 mg/dL — ABNORMAL HIGH (ref 70–99)
Potassium: 3.6 mmol/L (ref 3.5–5.1)
Sodium: 136 mmol/L (ref 135–145)

## 2021-02-19 MED ORDER — CLOPIDOGREL BISULFATE 75 MG PO TABS: 75.0000 mg | ORAL_TABLET | Freq: Every day | ORAL | 3 refills | Status: AC

## 2021-02-19 NOTE — Progress Notes (Signed)
Cardiology: Dr. Geraldo Pitter HF Cardiology: Dr. Aundra Dubin  66 y.o. with history of nonischemic cardiomyopathy and chronic systolic CHF, CAD, and ESRD presents for evaluation of CHF.  Patient has a long history of nonischemic cardiomyopathy.  Echo back in 12/15 showed EF 15%. LV function has gone up and down over the years.  Echoes in 2018 and 2019 showed normal EF, but more recently, echo in 11/21 showed EF back down to 30-35% with EF 25% on Cardiolite.  Caths in 8/16 and 8/18 showed moderate stenosis in proximal non-dominant RCA, medical management.  Patient additionally has ESRD and has been interested in getting a renal transplant, but this effort is impeded by low LF systolic function.   He was last seen in the clinic 08/07/20. EF had dropped to  EF 35% with diffuse hypokinesis, small pericardial effusion, moderate RV dilation with moderately decreased RV systolic function, PASP 64 mmHg, dilated IVC. He was set up for cath   Admitted after cath. Had successful PCI of mLAD with OCA and DES. Recommendations to  continue DAPT at least 6 months. He never took plavix.   In review of notes from general cardiology notes he has not been taking Plavix.   Presents with his wife in a wheelchair. Overall feeling ok but tired.He would like clearance for hernia surgery. He tells me that he has had a hard time with his current health. Says he can no longer work. SOB with exertion. Having difficulty walking up steps. Denies PND/Orthopnea. Denies chest pain. Continues on HD for volume management. No fever or chills. He has not taken plavix since his cath with stent. ? Not sure what has happened. He has been out of entresto for at least 30 days.   Labs (5/22): LDL 47, HDL 27  PMH: 1. Chronic systolic CHF: Suspect primarily nonischemic cardiomyopathy.   - Echo (12/15): EF 15% - PEA arrest 7/19.  - Echo (7/16): EF 10% - LHC (8/16): LHC showed 80% proximal stenosis in a nondominant RCA.  - Echo (3/17): EF up to  55-60%.  - He had an abdominal fat pad biopsy negative for amyloidosis.  - Echo (5/18): EF 55-60%.  - LHC (8/18): 70% stenosis small nondominant proximal RCA.  - Echo (11/21): EF 30-35%, global hypokinesis, PASP 65 mmHg.  - Cardiolite (12/21): EF 25%, inferior and inferolateral infarct with peri-infarct ischemia.  - Echo (7/22): EF 35% with diffuse hypokinesis, small pericardial effusion, moderate RV dilation with moderately decreased RV systolic function, PASP 64 mmHg, dilated IVC.  2. CAD:  - LHC (8/16): LHC showed 80% proximal stenosis in a nondominant RCA.  - LHC (8/18): 70% stenosis small nondominant proximal RCA.  - Cardiolite (12/21): EF 25%, inferior and inferolateral infarct with peri-infarct ischemia.  - LHC 08/12/20  Prox RCA lesion is 80% stenosed.   Prox LAD-1 lesion is 50% stenosed.   Prox LAD-2 lesion is 60% stenosed.   Prox LAD to Mid LAD lesion is 90% stenosed.   1st Sept lesion is 50% stenosed.   Dist LM lesion is 40% stenosed    Successful PCI of the mid LAD with OCT guidance and DES x 1.DAPT 6 months 3. ESRD 4. HTN 5. H/o thyroid nodule 6. Type 2 diabetes  7. Hyperlipidemia 8. Empyema: Left chest, chest tube drainage in 2/22.   Social History   Socioeconomic History   Marital status: Married    Spouse name: Not on file   Number of children: Not on file   Years of education: Not on  file   Highest education level: Not on file  Occupational History   Not on file  Tobacco Use   Smoking status: Former    Types: Cigarettes    Quit date: 06/1982    Years since quitting: 38.6   Smokeless tobacco: Never  Vaping Use   Vaping Use: Never used  Substance and Sexual Activity   Alcohol use: No   Drug use: No   Sexual activity: Not on file  Other Topics Concern   Not on file  Social History Narrative   Not on file   Social Determinants of Health   Financial Resource Strain: Not on file  Food Insecurity: Not on file  Transportation Needs: Not on file   Physical Activity: Not on file  Stress: Not on file  Social Connections: Not on file  Intimate Partner Violence: Not on file   Family History  Problem Relation Age of Onset   Heart failure Mother    Diabetes Mother    Hypertension Mother    Diabetes Father    ROS: All systems reviewed and negative except as per HPI.   Current Outpatient Medications  Medication Sig Dispense Refill   acetaminophen (TYLENOL) 325 MG tablet Take 2 tablets (650 mg total) by mouth every 6 (six) hours as needed for mild pain (or Fever >/= 101). 90 tablet 0   aspirin 81 MG tablet Take 81 mg by mouth daily.     B Complex-C-Folic Acid (RENA-VITE RX) 1 MG TABS Take 1 tablet by mouth daily.  6   calcium acetate (PHOSLO) 667 MG capsule Take 667 mg by mouth 3 (three) times daily with meals.     carvedilol (COREG) 12.5 MG tablet Take 1 tablet (12.5 mg total) by mouth 2 (two) times daily with a meal. 180 tablet 0   Darbepoetin Alfa (ARANESP) 40 MCG/0.4ML SOSY injection Inject 0.4 mLs (40 mcg total) into the vein every Monday with hemodialysis. 8.4 mL    doxercalciferol (HECTOROL) 4 MCG/2ML injection Inject 1.5 mLs (3 mcg total) into the vein every Monday, Wednesday, and Friday with hemodialysis. 2 mL    glipiZIDE (GLUCOTROL) 5 MG tablet Take 0.5 tablets (2.5 mg total) by mouth daily before breakfast. 90 tablet 0   heparin 1000 unit/mL SOLN injection 1.5 mLs (1,500 Units total) by Dialysis route as needed (in dialysis).     rosuvastatin (CRESTOR) 5 MG tablet Take 5 mg by mouth daily.     nitroGLYCERIN (NITROSTAT) 0.4 MG SL tablet Place 1 tablet (0.4 mg total) under the tongue every 5 (five) minutes as needed. (Patient not taking: Reported on 02/19/2021) 25 tablet 6   No current facility-administered medications for this encounter.   BP 110/68    Pulse 78    Wt 74.1 kg (163 lb 6.4 oz)    SpO2 91%    BMI 22.16 kg/m  General: Arrived in a wheel chair. Appear weka. No resp difficulty HEENT: normal Neck: supple. no JVD.  Carotids 2+ bilat; no bruits. No lymphadenopathy or thryomegaly appreciated. Cor: PMI nondisplaced. Regular rate & rhythm. No rubs, gallops or murmurs. Lungs: clear Abdomen: soft, nontender, nondistended. No hepatosplenomegaly. No bruits or masses. Good bowel sounds. Extremities: no cyanosis, clubbing, rash, edema Neuro: alert & orientedx3, cranial nerves grossly intact. moves all 4 extremities w/o difficulty. Affect pleasant  Assessment/Plan: 1. Chronic systolic CHF: EF has fluctuated significantly over the years.  Caths in 8/16 and 8/18 showed moderate disease in a small, nondominant RCA.  Cardiomyopathy therefore thought to be  nonischemic.  Echo in 11/21 showed fall in EF from normal range in 5/18 to 30-35%.  Lexiscan Cardiolite showed EF 25% with inferior/inferolateral MI with peri-infarct ischemia.  Echo was done today and reviewed, EF 35% with diffuse hypokinesis, small pericardial effusion, moderate RV dilation with moderately decreased RV systolic function, PASP 64 mmHg, dilated IVC.  NYHA class III . Volume status stable.  - Continue Coreg 12.5 mg bid, hold dose on the mornings of HD.  - BP has been running low. Will not place back on entresto.  - Need to repeat ECHO .   2. CAD: Caths in 8/16 and 8/18 showed moderate disease in a small, nondominant RCA.  07/2021 Successful PCI of the mid LAD with OCT guidance and DES x 1. - He has not been on plavix and I am not sure why. We discussed importance. I have ordered plavix again today.   - Continue ASA 81 daily.  - Continue Crestor 5 mg daily.  Good LDL in 5/22.  3. ESRD: Patient is interested in renal transplant, but he will need improvement in LV function prior to this.   He is seeking surgical clearance but I think we need additional information. I am concerned about his symptoms and given he did not start plavix. For now rep with an ECHO. At this point he is at high risk for perioperative complications. He is understands and tells me he is  willing to take the risk. His wife was present during the discussion.   Follow up with Dr Aundra Dubin in 1-2 weeks.    Rochella Benner NP-C  02/19/2021

## 2021-02-19 NOTE — Patient Instructions (Signed)
Medication Changes:  Start Plavix 75 mg Daily  Lab Work:  Done today, we will call you for abnormal results  Testing/Procedures:  Your physician has requested that you have an echocardiogram. Echocardiography is a painless test that uses sound waves to create images of your heart. It provides your doctor with information about the size and shape of your heart and how well your hearts chambers and valves are working. This procedure takes approximately one hour. There are no restrictions for this procedure.  Referrals:  None  Special Instructions // Education:  Do the following things EVERYDAY: Weigh yourself in the morning before breakfast. Write it down and keep it in a log. Take your medicines as prescribed Eat low salt foods--Limit salt (sodium) to 2000 mg per day.  Stay as active as you can everyday Limit all fluids for the day to less than 2 liters  Follow-Up in: 1-2 weeks with Dr Aundra Dubin  At the [facility], you and your health needs are our priority. We have a designated team specialized in the treatment of Heart Failure. This Care Team includes your primary Heart Failure Specialized Cardiologist (physician), Advanced Practice Providers (APPs- Physician Assistants and Nurse Practitioners), and Pharmacist who all work together to provide you with the care you need, when you need it.   You may see any of the following providers on your designated Care Team at your next follow up:  Dr Glori Bickers Dr Haynes Kerns, NP Lyda Jester, [place] [facility] [place], [place] Audry Riles, PharmD   Please be sure to bring in all your medications bottles to every appointment.   Need to Contact us:  If you have any questions or concerns before your next appointment please send [place] a message through [place] or call our office at [phone].    TO LEAVE A MESSAGE FOR THE NURSE SELECT OPTION 2, PLEASE LEAVE A MESSAGE INCLUDING: YOUR  NAME DATE OF BIRTH CALL BACK NUMBER REASON FOR CALL**this is important as we prioritize the call backs  YOU WILL RECEIVE A CALL BACK THE SAME DAY AS LONG AS YOU CALL BEFORE 4:00 PM

## 2021-02-25 ENCOUNTER — Encounter (HOSPITAL_COMMUNITY): Payer: Medicare (Managed Care) | Admitting: Cardiology

## 2021-02-25 ENCOUNTER — Other Ambulatory Visit (HOSPITAL_COMMUNITY): Payer: Medicare (Managed Care)

## 2021-03-03 ENCOUNTER — Ambulatory Visit (HOSPITAL_BASED_OUTPATIENT_CLINIC_OR_DEPARTMENT_OTHER)
Admission: RE | Admit: 2021-03-03 | Discharge: 2021-03-03 | Disposition: A | Discharge status: Home / Self Care | Payer: Medicare (Managed Care) | Source: Ambulatory Visit | Attending: Cardiology | Admitting: Cardiology

## 2021-03-03 ENCOUNTER — Other Ambulatory Visit: Payer: Self-pay

## 2021-03-03 ENCOUNTER — Ambulatory Visit (HOSPITAL_COMMUNITY)
Admission: RE | Admit: 2021-03-03 | Discharge: 2021-03-03 | Disposition: A | Discharge status: Home / Self Care | Payer: Medicare (Managed Care) | Source: Ambulatory Visit | Attending: Cardiology | Admitting: Cardiology

## 2021-03-03 VITALS — BP 135/72 | HR 84 | Wt 164.6 lb

## 2021-03-03 DIAGNOSIS — I3139 Other pericardial effusion (noninflammatory): Secondary | ICD-10-CM | POA: Insufficient documentation

## 2021-03-03 DIAGNOSIS — Z7902 Long term (current) use of antithrombotics/antiplatelets: Secondary | ICD-10-CM | POA: Diagnosis not present

## 2021-03-03 DIAGNOSIS — I252 Old myocardial infarction: Secondary | ICD-10-CM | POA: Insufficient documentation

## 2021-03-03 DIAGNOSIS — I2721 Secondary pulmonary arterial hypertension: Secondary | ICD-10-CM | POA: Diagnosis not present

## 2021-03-03 DIAGNOSIS — I132 Hypertensive heart and chronic kidney disease with heart failure and with stage 5 chronic kidney disease, or end stage renal disease: Secondary | ICD-10-CM | POA: Diagnosis not present

## 2021-03-03 DIAGNOSIS — Z955 Presence of coronary angioplasty implant and graft: Secondary | ICD-10-CM | POA: Diagnosis not present

## 2021-03-03 DIAGNOSIS — I5022 Chronic systolic (congestive) heart failure: Secondary | ICD-10-CM | POA: Insufficient documentation

## 2021-03-03 DIAGNOSIS — K4090 Unilateral inguinal hernia, without obstruction or gangrene, not specified as recurrent: Secondary | ICD-10-CM | POA: Diagnosis not present

## 2021-03-03 DIAGNOSIS — R0600 Dyspnea, unspecified: Secondary | ICD-10-CM | POA: Insufficient documentation

## 2021-03-03 DIAGNOSIS — E785 Hyperlipidemia, unspecified: Secondary | ICD-10-CM | POA: Insufficient documentation

## 2021-03-03 DIAGNOSIS — Z79899 Other long term (current) drug therapy: Secondary | ICD-10-CM | POA: Diagnosis not present

## 2021-03-03 DIAGNOSIS — N186 End stage renal disease: Secondary | ICD-10-CM | POA: Insufficient documentation

## 2021-03-03 DIAGNOSIS — E1122 Type 2 diabetes mellitus with diabetic chronic kidney disease: Secondary | ICD-10-CM | POA: Insufficient documentation

## 2021-03-03 DIAGNOSIS — I428 Other cardiomyopathies: Secondary | ICD-10-CM | POA: Insufficient documentation

## 2021-03-03 DIAGNOSIS — Z7982 Long term (current) use of aspirin: Secondary | ICD-10-CM | POA: Insufficient documentation

## 2021-03-03 DIAGNOSIS — I2510 Atherosclerotic heart disease of native coronary artery without angina pectoris: Secondary | ICD-10-CM | POA: Diagnosis not present

## 2021-03-03 DIAGNOSIS — I082 Rheumatic disorders of both aortic and tricuspid valves: Secondary | ICD-10-CM | POA: Diagnosis not present

## 2021-03-03 LAB — ECHOCARDIOGRAM COMPLETE: S' Lateral: 3.3 cm

## 2021-03-03 LAB — LIPID PANEL
Cholesterol: 88 mg/dL (ref 0–200)
HDL: 28 mg/dL — ABNORMAL LOW (ref >40)
LDL Cholesterol: 48 mg/dL (ref 0–99)
Total CHOL/HDL Ratio: 3.1 RATIO
Triglycerides: 59 mg/dL (ref <150)
VLDL: 12 mg/dL (ref 0–40)

## 2021-03-03 NOTE — Patient Instructions (Addendum)
Thank you for your visit today.  Labs done today, your results will be available in MyChart, we will contact you for abnormal readings.  Your provider has requested a V/Q Scan. ONCE APPROVED BY INSURANCE YOU WILL BE CALLED TO SCHEDULE THE APPOINTMENT.  Your provider has recommended that you have a home sleep study.  We have provided you with the equipment in our office today. Please download the app and follow the instructions. YOUR PIN NUMBER IS: [id number]. Once you have completed the test you just dispose of the equipment, the information is automatically uploaded to [place] via blue-tooth technology. If your test is positive for sleep apnea and you need a home CPAP machine you will be contacted by Dr Theodosia Blender office [facility]) to set this up.  Your physician recommends that you schedule a follow-up appointment in: 6 MONTHS (August 2023) ** Call the office in June to schedule your appointment**  If you have any questions or concerns before your next appointment please send [place] a message through [place] or call our office at [phone].    TO LEAVE A MESSAGE FOR THE NURSE SELECT OPTION 2, PLEASE LEAVE A MESSAGE INCLUDING: YOUR NAME DATE OF BIRTH CALL BACK NUMBER REASON FOR CALL**this is important as we prioritize the call backs  YOU WILL RECEIVE A CALL BACK THE SAME DAY AS LONG AS YOU CALL BEFORE 4:00 PM  At the [facility], you and your health needs are our priority. As part of our continuing mission to provide you with exceptional heart care, we have created designated Provider Care Teams. These Care Teams include your primary Cardiologist (physician) and Advanced Practice Providers (APPs- Physician Assistants and Nurse Practitioners) who all work together to provide you with the care you need, when you need it.   You may see any of the following providers on your designated Care Team at your next follow up: Dr Glori Bickers Dr Haynes Kerns, NP Lyda Jester, [place] [facility] [place], [place] Audry Riles, PharmD   Please be sure to bring in all your medications bottles to every appointment.

## 2021-03-03 NOTE — Progress Notes (Addendum)
Height:6"1'     Weight:164.6lb BMI:22.3  Today's Date:03/03/2021  STOP BANG RISK ASSESSMENT S (snore) Have you been told that you snore?     YES   T (tired) Are you often tired, fatigued, or sleepy during the day?   YES  O (obstruction) Do you stop breathing, choke, or gasp during sleep? NO   P (pressure) Do you have or are you being treated for high blood pressure? YES   B (BMI) Is your body index greater than 35 kg/m? NO   A (age) Are you 66 years old or older? YES   N (neck) Do you have a neck circumference greater than 16 inches?   NO   G (gender) Are you a male? YES   TOTAL STOP/BANG YES ANSWERS 5                                                                       For Office Use Only              Procedure Order Form    YES to 3+ Stop Bang questions OR two clinical symptoms - patient qualifies for WatchPAT (CPT 01779)      Clinical Notes: Will consult Sleep Specialist and refer for management of therapy due to patient increased risk of Sleep Apnea. Ordering a sleep study due to the following two clinical symptoms: Excessive daytime sleepiness G47.10 / Difficulty concentrating R41.840 / Memory problems or poor judgment G31.84 / Loud snoring R06.83 / Depression F32.9 / Unrefreshed by sleep G47.8 / Impotence N52.9 / History of high blood pressure R03.0 / Insomnia G47.00

## 2021-03-05 NOTE — Progress Notes (Signed)
Cardiology: Dr. Geraldo Pitter HF Cardiology: Dr. Aundra Dubin  66 y.o. with history of nonischemic cardiomyopathy and chronic systolic CHF, CAD, and ESRD presents for evaluation of CHF.  Patient has a long history of nonischemic cardiomyopathy.  Echo back in 12/15 showed EF 15%. LV function has gone up and down over the years.  Echoes in 2018 and 2019 showed normal EF, but more recently, echo in 11/21 showed EF back down to 30-35% with EF 25% on Cardiolite.  Caths in 8/16 and 8/18 showed moderate stenosis in proximal non-dominant RCA, medical management.  Patient additionally has ESRD and has been interested in getting a renal transplant, but this effort is impeded by low LF systolic function.   Echo was done in 7/22 showed EF 35% with diffuse hypokinesis, small pericardial effusion, moderate RV dilation with moderately decreased RV systolic function, PASP 64 mmHg, dilated IVC.  LHC/RHC in 7/22 showed 80% stenosis nondominant RCA, serial up to 90% stenoses in the proximal LAD => DES to LAD.    Echo was done today and reviewed, EF 50-55% with mild LVH, moderate LV dilation with moderately decreased RV dysfunction, PASP 69 mmHg, IVC dilated, D-shaped IV septum consistent with RV pressure/volume overload.   Patient returns for followup of CHF.  He was taken off Entresto by his nephrologist.  He still has dyspnea when walking up 2 flights of stairs.  Does ok on flat ground.  No chest pain.  No orthopnea/PND.  He is being evaluated for renal transplant. Weight is stable.   Labs (5/22): LDL 47, HDL 27  PMH: 1. Chronic systolic CHF: Suspect primarily nonischemic cardiomyopathy.   - Echo (12/15): EF 15% - PEA arrest 7/19.  - Echo (7/16): EF 10% - LHC (8/16): LHC showed 80% proximal stenosis in a nondominant RCA.  - Echo (3/17): EF up to 55-60%.  - He had an abdominal fat pad biopsy negative for amyloidosis.  - Echo (5/18): EF 55-60%.  - LHC (8/18): 70% stenosis small nondominant proximal RCA.  - Echo (11/21): EF  30-35%, global hypokinesis, PASP 65 mmHg.  - Cardiolite (12/21): EF 25%, inferior and inferolateral infarct with peri-infarct ischemia.  - Echo (7/22): EF 35% with diffuse hypokinesis, small pericardial effusion, moderate RV dilation with moderately decreased RV systolic function, PASP 64 mmHg, dilated IVC.  - RHC (7/22): mean RA 15, PA 65/26 mean 39, mean PCWP 8, PAPi 2.6, CI 3.49, PVR 4.5 WU.  - Echo (2/23): EF 50-55% with mild LVH, moderate LV dilation with moderately decreased RV dysfunction, PASP 69 mmHg, IVC dilated, D-shaped IV septum consistent with RV pressure/volume overload.  2. CAD:  - LHC (8/16): LHC showed 80% proximal stenosis in a nondominant RCA.  - LHC (8/18): 70% stenosis small nondominant proximal RCA.  - Cardiolite (12/21): EF 25%, inferior and inferolateral infarct with peri-infarct ischemia.  - LHC (7/22): 80% stenosis nondominant RCA, serial up to 90% stenoses in the proximal LAD => DES to LAD. 3. ESRD 4. HTN 5. H/o thyroid nodule 6. Type 2 diabetes  7. Hyperlipidemia 8. Empyema: Left chest, chest tube drainage in 2/22.   Social History   Socioeconomic History   Marital status: Married    Spouse name: Not on file   Number of children: Not on file   Years of education: Not on file   Highest education level: Not on file  Occupational History   Not on file  Tobacco Use   Smoking status: Former    Types: Cigarettes    Quit date: 06/1982  Years since quitting: 38.7   Smokeless tobacco: Never  Vaping Use   Vaping Use: Never used  Substance and Sexual Activity   Alcohol use: No   Drug use: No   Sexual activity: Not on file  Other Topics Concern   Not on file  Social History Narrative   Not on file   Social Determinants of Health   Financial Resource Strain: Not on file  Food Insecurity: Not on file  Transportation Needs: Not on file  Physical Activity: Not on file  Stress: Not on file  Social Connections: Not on file  Intimate Partner Violence:  Not on file   Family History  Problem Relation Age of Onset   Heart failure Mother    Diabetes Mother    Hypertension Mother    Diabetes Father    ROS: All systems reviewed and negative except as per HPI.   Current Outpatient Medications  Medication Sig Dispense Refill   acetaminophen (TYLENOL) 325 MG tablet Take 2 tablets (650 mg total) by mouth every 6 (six) hours as needed for mild pain (or Fever >/= 101). 90 tablet 0   aspirin 81 MG tablet Take 81 mg by mouth daily.     B Complex-C-Folic Acid (RENA-VITE RX) 1 MG TABS Take 1 tablet by mouth daily.  6   calcium acetate (PHOSLO) 667 MG capsule Take 667 mg by mouth 3 (three) times daily with meals.     carvedilol (COREG) 12.5 MG tablet Take 1 tablet (12.5 mg total) by mouth 2 (two) times daily with a meal. 180 tablet 0   clopidogrel (PLAVIX) 75 MG tablet Take 1 tablet (75 mg total) by mouth daily. 90 tablet 3   Darbepoetin Alfa (ARANESP) 40 MCG/0.4ML SOSY injection Inject 0.4 mLs (40 mcg total) into the vein every Monday with hemodialysis. 8.4 mL    doxercalciferol (HECTOROL) 4 MCG/2ML injection Inject 1.5 mLs (3 mcg total) into the vein every Monday, Wednesday, and Friday with hemodialysis. 2 mL    glipiZIDE (GLUCOTROL) 5 MG tablet Take 0.5 tablets (2.5 mg total) by mouth daily before breakfast. 90 tablet 0   heparin 1000 unit/mL SOLN injection 1.5 mLs (1,500 Units total) by Dialysis route as needed (in dialysis).     nitroGLYCERIN (NITROSTAT) 0.4 MG SL tablet Place 1 tablet (0.4 mg total) under the tongue every 5 (five) minutes as needed. 25 tablet 6   rosuvastatin (CRESTOR) 5 MG tablet Take 5 mg by mouth daily.     No current facility-administered medications for this encounter.   BP 135/72    Pulse 84    Wt 74.7 kg (164 lb 9.6 oz)    SpO2 93%    BMI 22.32 kg/m  General: NAD Neck: JVP 10-12 cm, no thyromegaly or thyroid nodule.  Lungs: Clear to auscultation bilaterally with normal respiratory effort. CV: Nondisplaced PMI.  Heart  regular S1/S2, no S3/S4, no murmur.  No peripheral edema.  No carotid bruit.  Normal pedal pulses.  Abdomen: Soft, nontender, no hepatosplenomegaly, no distention.  Skin: Intact without lesions or rashes.  Neurologic: Alert and oriented x 3.  Psych: Normal affect. Extremities: No clubbing or cyanosis.  HEENT: Normal.   Assessment/Plan: 1. Chronic systolic CHF: EF has fluctuated significantly over the years.  Caths in 8/16 and 8/18 showed moderate disease in a small, nondominant RCA.  Cardiomyopathy therefore thought to be nonischemic.  Echo in 11/21 showed fall in EF from normal range in 5/18 to 30-35%.  Lexiscan Cardiolite showed EF 25% with  inferior/inferolateral MI with peri-infarct ischemia.  Echo in 7/22 showed EF 35% with diffuse hypokinesis, small pericardial effusion, moderate RV dilation with moderately decreased RV systolic function, PASP 64 mmHg, dilated IVC. Cath was done showing severe LAD stenosis with PCI in 7/22.  Echo done today showed EF back up to 50-55% though RV still moderately dilated and moderately dysfunctional. On exam, he is volume overloaded.  NYHA class II symptoms.  - Would recommend again lowering dry weight at dialysis given volume overload.  - Continue Coreg 12.5 mg bid, hold dose on the mornings of HD.  - He is off Entresto per nephrology (suspect BP was dropping at HD).  2. CAD: Caths in 8/16 and 8/18 showed moderate disease in a small, nondominant RCA.  LHC in 7/22 showed severe proximal LAD stenosis treated with PCI. No chest pain.  - Continue ASA 81 daily and Plavix 75 mg daily.  - Continue Crestor 5 mg daily.  Check lipids, would aim for LDL < 55.  3. ESRD: Patient is interested in renal transplant, LV EF now improved.  4. Pulmonary arterial hypertension with RV failure: Echo in 2/23 showed moderately dilated RV with moderately decreased RV systolic function and D-shaped septum. [place] in 7/22 showed moderate pulmonary arterial hypertension with PVR 4.5 WU. Cause  uncertain, ?mixed from pulmonary venous hypertension and perhaps OSA.  - I will arrange for sleep study.  - V/Q scan to rule out chronic PE.  - Decrease dry weight at HD, get more fluid off.  5. Inguinal hernia: Patient has a very painful inguinal hernia.  He has been > 6 months post PCI at this point.  I recommended that he wait a year before surgery but he says that he cannot wait that long.  As he has been greater than 6 months, think he can hold Plavix pre-surgery at this point and have his inguinal hernia repaired.   Followup 6 months.   Loralie Champagne 03/05/2021

## 2021-03-09 ENCOUNTER — Telehealth (HOSPITAL_COMMUNITY): Payer: Self-pay | Admitting: *Deleted

## 2021-03-09 NOTE — Telephone Encounter (Signed)
VQ scan auth request faxed to [facility]

## 2021-03-10 ENCOUNTER — Telehealth (HOSPITAL_COMMUNITY): Payer: Self-pay | Admitting: *Deleted

## 2021-03-10 NOTE — Telephone Encounter (Signed)
No pre cert reqd for Vq scan per [facility]

## 2021-03-16 ENCOUNTER — Other Ambulatory Visit (HOSPITAL_COMMUNITY): Payer: Self-pay

## 2021-03-16 DIAGNOSIS — I5043 Acute on chronic combined systolic (congestive) and diastolic (congestive) heart failure: Secondary | ICD-10-CM

## 2021-03-18 ENCOUNTER — Telehealth (HOSPITAL_COMMUNITY): Payer: Self-pay | Admitting: *Deleted

## 2021-03-18 NOTE — Telephone Encounter (Signed)
Home sleep study request faxed to [facility]

## 2021-03-24 ENCOUNTER — Encounter (HOSPITAL_COMMUNITY): Admission: RE | Admit: 2021-03-24 | Payer: Medicare (Managed Care) | Source: Ambulatory Visit

## 2021-04-28 ENCOUNTER — Other Ambulatory Visit: Payer: Self-pay | Admitting: Student

## 2021-05-18 ENCOUNTER — Telehealth (HOSPITAL_COMMUNITY): Payer: Self-pay | Admitting: Surgery

## 2021-05-18 NOTE — Telephone Encounter (Signed)
I attempted to reach patient to let him know that insurance prior authorization is not necessary in order to complete his ordered home sleep study.  His phone number was not working and there was no alternate number.  I will try again at another time. ?

## 2021-05-25 DEATH — deceased

## 2022-07-22 IMAGING — DX DG CHEST 1V PORT
1 series · 1 of 1 positions shown · non-contrast
Comparison: 03/23/2020

CLINICAL DATA: Left pleural effusion

EXAM:
PORTABLE CHEST 1 VIEW

[chest ap]
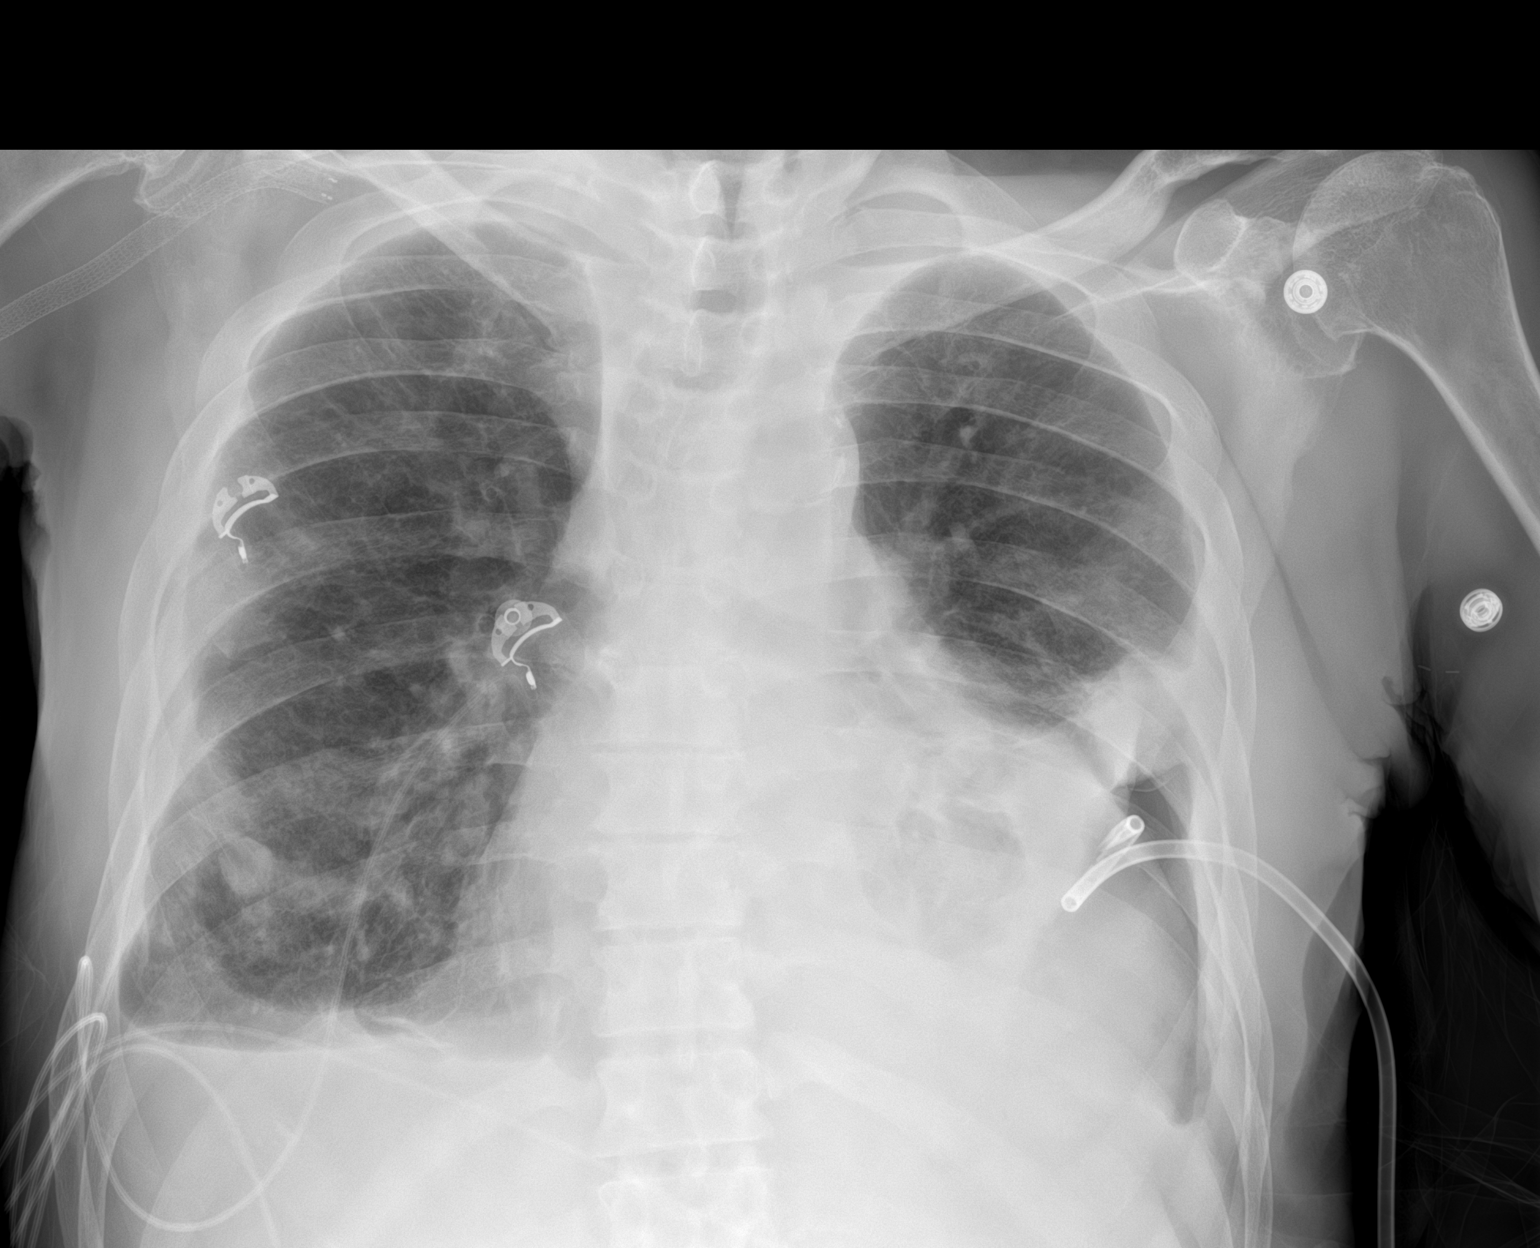

[1 of 1 positions shown; findings below may reference images not displayed]

FINDINGS: Left pigtail pleural catheter is again identified. Similar basilar
pneumothorax. Similar opacification of the left lung base reflecting
pleural effusion, pleural thickening, and atelectasis/consolidation
as seen on recent chest CT. Small right pleural effusion and rounded
opacities within the right lung are unchanged. Stable
cardiomediastinal contours. Right axillary vascular stent.
IMPRESSION: No substantial change since 03/23/2020. Similar left basilar
pneumothorax and left pleural effusion with chest tube present.

## 2022-07-23 IMAGING — DX DG CHEST 1V PORT
1 series · 1 of 1 positions shown · non-contrast
Comparison: 03/24/2020

CLINICAL DATA: Follow-up pleural effusion

EXAM:
PORTABLE CHEST 1 VIEW

[chest]
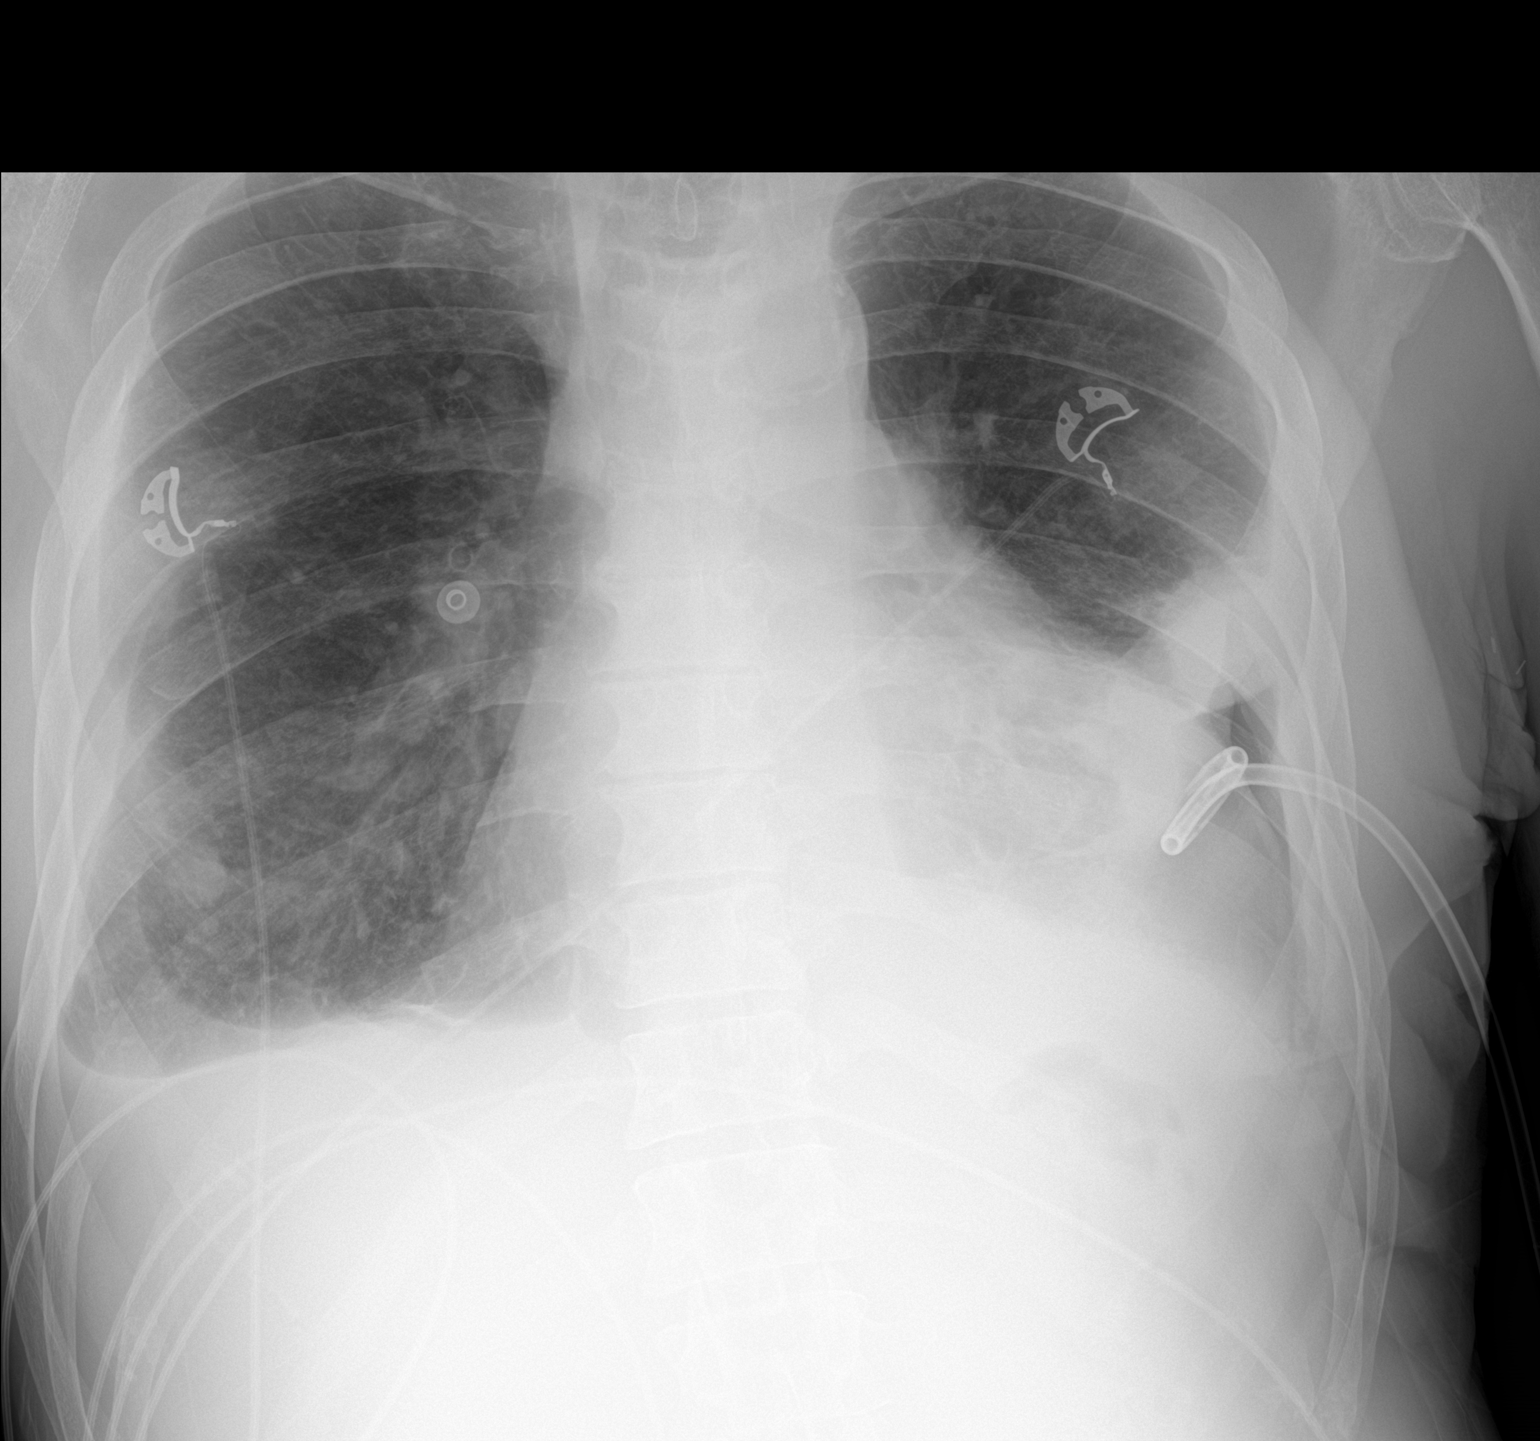

[1 of 1 positions shown; findings below may reference images not displayed]

FINDINGS: Cardiac shadow is enlarged but stable. Pigtail catheter is again
noted in the left base with basilar pneumothorax identified. Minimal
pleural fluid is seen. Consolidation in the left base is noted
laterally. Small right-sided pleural effusion is seen. Some
nodularity is noted in the right base similar to that noted on prior
CT examination.
IMPRESSION: Persistent left basilar pneumothorax. The remainder of the exam is
stable from the prior study.

## 2022-07-24 IMAGING — DX DG CHEST 1V PORT
1 series · 1 of 1 positions shown · non-contrast
Comparison: CT 03/25/2020.  Chest x-ray 03/25/2020.

CLINICAL DATA: Pleural effusion.  Empyema.

EXAM:
PORTABLE CHEST 1 VIEW

[chest ap]
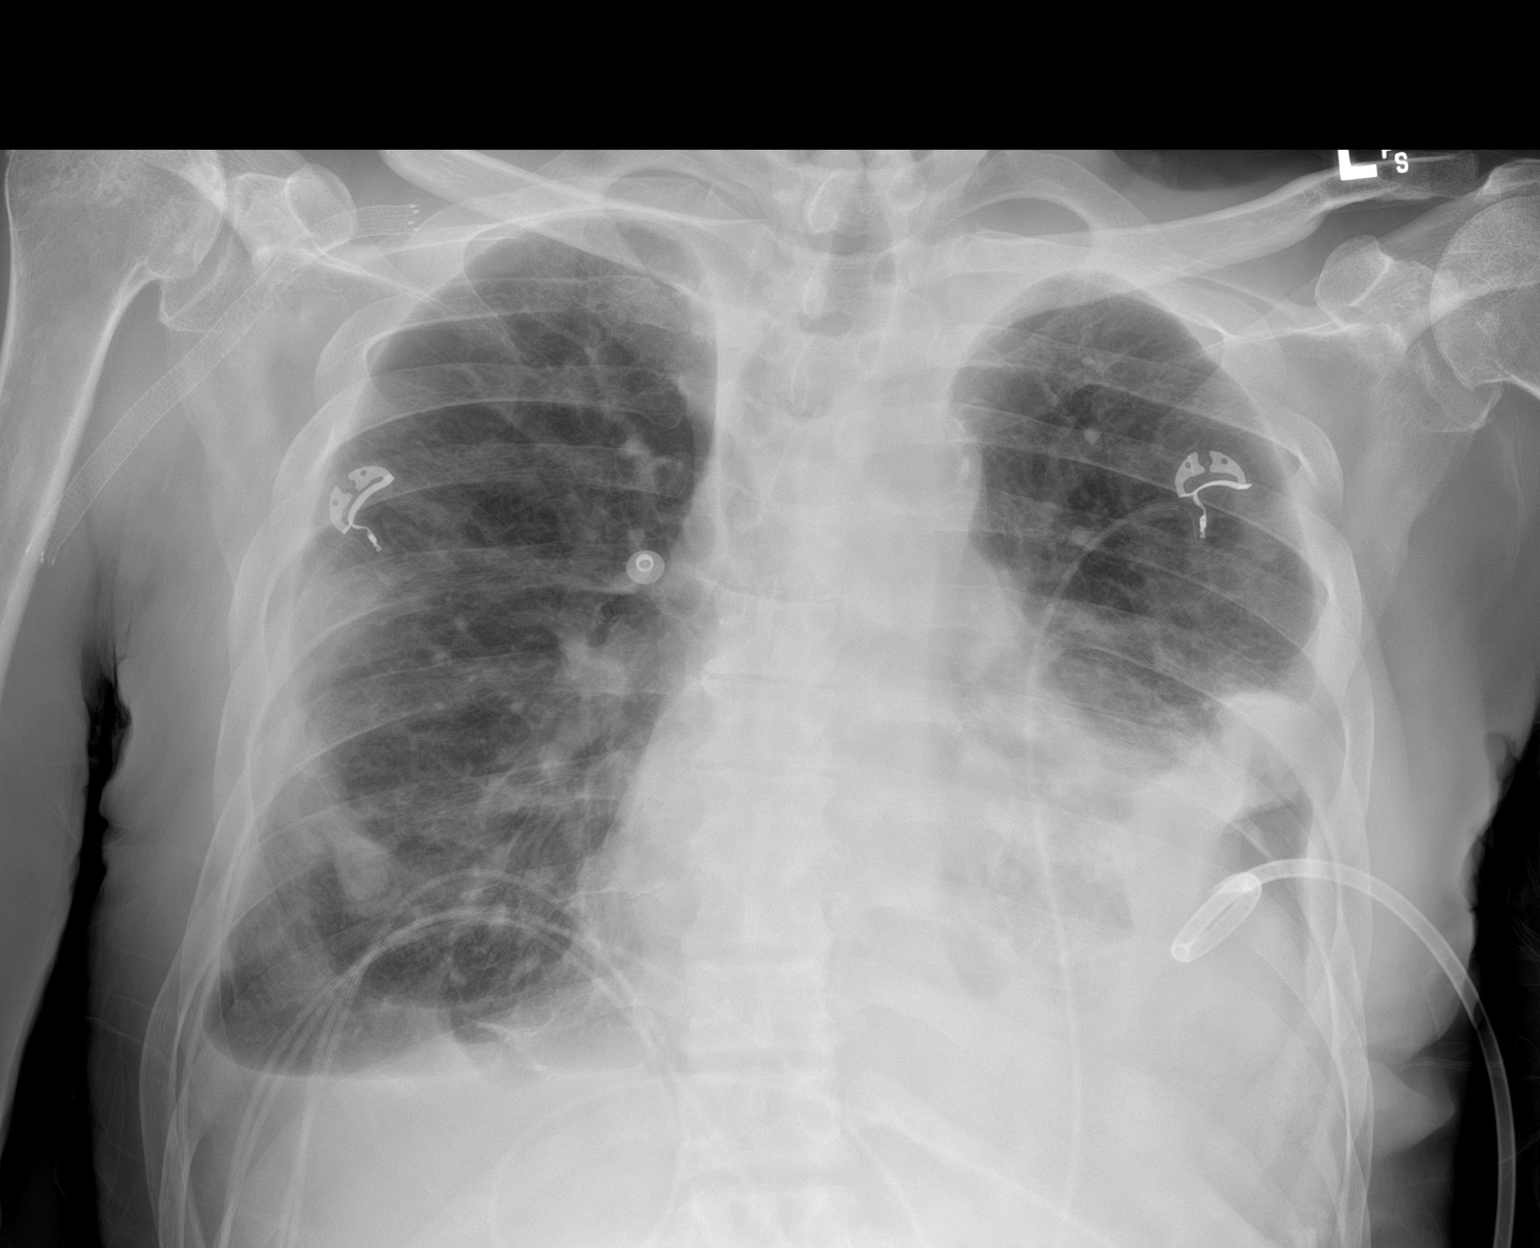

[1 of 1 positions shown; findings below may reference images not displayed]

FINDINGS: Left chest tube in stable position. Stable left base pleural fluid
and air collection consistent with known empyema again noted. No
interim change. Stable bibasilar atelectasis/infiltrates and small
right pleural effusion again noted without interim change. Heart
size stable. Degenerative change thoracic spine. Right upper
extremity vascular stents noted.
IMPRESSION: 1. Left chest tube in stable position. Stable left base pleural
fluid and air collection consistent with known empyema again noted.
No interim change.

2. Stable bibasilar atelectasis/infiltrates and small right pleural
effusion again noted without interim change.
